# Patient Record
Sex: Male | Born: 1940 | Race: White | Hispanic: No | Marital: Married | Smoking: Former smoker
Health system: Southern US, Community
[De-identification: ages and names within clinical notes are randomized; demographics above are authoritative.]

## PROBLEM LIST (undated history)

## (undated) DIAGNOSIS — Z8669 Personal history of other diseases of the nervous system and sense organs: Secondary | ICD-10-CM

## (undated) DIAGNOSIS — K859 Acute pancreatitis without necrosis or infection, unspecified: Secondary | ICD-10-CM

## (undated) DIAGNOSIS — M1990 Unspecified osteoarthritis, unspecified site: Secondary | ICD-10-CM

## (undated) DIAGNOSIS — S4290XA Fracture of unspecified shoulder girdle, part unspecified, initial encounter for closed fracture: Secondary | ICD-10-CM

## (undated) DIAGNOSIS — I10 Essential (primary) hypertension: Secondary | ICD-10-CM

## (undated) DIAGNOSIS — K219 Gastro-esophageal reflux disease without esophagitis: Secondary | ICD-10-CM

## (undated) DIAGNOSIS — N200 Calculus of kidney: Secondary | ICD-10-CM

## (undated) DIAGNOSIS — I2510 Atherosclerotic heart disease of native coronary artery without angina pectoris: Secondary | ICD-10-CM

## (undated) DIAGNOSIS — I350 Nonrheumatic aortic (valve) stenosis: Secondary | ICD-10-CM

## (undated) DIAGNOSIS — Z87442 Personal history of urinary calculi: Secondary | ICD-10-CM

## (undated) DIAGNOSIS — E785 Hyperlipidemia, unspecified: Secondary | ICD-10-CM

## (undated) DIAGNOSIS — C801 Malignant (primary) neoplasm, unspecified: Secondary | ICD-10-CM

## (undated) DIAGNOSIS — N400 Enlarged prostate without lower urinary tract symptoms: Secondary | ICD-10-CM

## (undated) HISTORY — DX: Calculus of kidney: N20.0

## (undated) HISTORY — DX: Benign prostatic hyperplasia without lower urinary tract symptoms: N40.0

## (undated) HISTORY — PX: KNEE SURGERY: SHX244

## (undated) HISTORY — DX: Atherosclerotic heart disease of native coronary artery without angina pectoris: I25.10

## (undated) HISTORY — PX: EYE SURGERY: SHX253

## (undated) HISTORY — DX: Nonrheumatic aortic (valve) stenosis: I35.0

## (undated) HISTORY — DX: Hyperlipidemia, unspecified: E78.5

## (undated) HISTORY — PX: HAND SURGERY: SHX662

## (undated) HISTORY — DX: Malignant (primary) neoplasm, unspecified: C80.1

## (undated) HISTORY — DX: Personal history of other diseases of the nervous system and sense organs: Z86.69

## (undated) HISTORY — DX: Essential (primary) hypertension: I10

## (undated) HISTORY — DX: Gastro-esophageal reflux disease without esophagitis: K21.9

---

## 1983-08-07 HISTORY — PX: HIP SURGERY: SHX245

## 1999-04-10 ENCOUNTER — Inpatient Hospital Stay (HOSPITAL_COMMUNITY): Admission: EM | Admit: 1999-04-10 | Discharge: 1999-04-14 | Payer: Self-pay | Admitting: Emergency Medicine

## 1999-04-10 ENCOUNTER — Encounter: Payer: Self-pay | Admitting: Emergency Medicine

## 2000-12-14 ENCOUNTER — Encounter: Payer: Self-pay | Admitting: Emergency Medicine

## 2000-12-14 ENCOUNTER — Emergency Department (HOSPITAL_COMMUNITY): Admission: EM | Admit: 2000-12-14 | Discharge: 2000-12-14 | Payer: Self-pay | Admitting: Emergency Medicine

## 2003-10-17 ENCOUNTER — Emergency Department (HOSPITAL_COMMUNITY): Admission: EM | Admit: 2003-10-17 | Discharge: 2003-10-18 | Payer: Self-pay | Admitting: Emergency Medicine

## 2004-06-12 ENCOUNTER — Ambulatory Visit: Payer: Self-pay | Admitting: Endocrinology

## 2005-04-17 ENCOUNTER — Ambulatory Visit: Payer: Self-pay | Admitting: Endocrinology

## 2005-05-10 ENCOUNTER — Ambulatory Visit: Payer: Self-pay | Admitting: Endocrinology

## 2005-05-17 ENCOUNTER — Ambulatory Visit: Payer: Self-pay | Admitting: Endocrinology

## 2005-11-03 ENCOUNTER — Emergency Department (HOSPITAL_COMMUNITY): Admission: EM | Admit: 2005-11-03 | Discharge: 2005-11-04 | Payer: Self-pay | Admitting: Emergency Medicine

## 2005-11-06 ENCOUNTER — Ambulatory Visit: Payer: Self-pay | Admitting: Endocrinology

## 2005-11-26 ENCOUNTER — Emergency Department (HOSPITAL_COMMUNITY): Admission: EM | Admit: 2005-11-26 | Discharge: 2005-11-26 | Payer: Self-pay | Admitting: Emergency Medicine

## 2006-10-10 ENCOUNTER — Ambulatory Visit: Payer: Self-pay | Admitting: Internal Medicine

## 2006-10-10 DIAGNOSIS — K219 Gastro-esophageal reflux disease without esophagitis: Secondary | ICD-10-CM | POA: Insufficient documentation

## 2006-10-10 DIAGNOSIS — R011 Cardiac murmur, unspecified: Secondary | ICD-10-CM | POA: Insufficient documentation

## 2006-10-10 DIAGNOSIS — E785 Hyperlipidemia, unspecified: Secondary | ICD-10-CM | POA: Insufficient documentation

## 2006-10-10 DIAGNOSIS — R079 Chest pain, unspecified: Secondary | ICD-10-CM | POA: Insufficient documentation

## 2006-10-10 DIAGNOSIS — I10 Essential (primary) hypertension: Secondary | ICD-10-CM | POA: Insufficient documentation

## 2006-10-10 DIAGNOSIS — E119 Type 2 diabetes mellitus without complications: Secondary | ICD-10-CM | POA: Insufficient documentation

## 2006-10-14 ENCOUNTER — Encounter (INDEPENDENT_AMBULATORY_CARE_PROVIDER_SITE_OTHER): Payer: Self-pay | Admitting: Internal Medicine

## 2006-10-18 ENCOUNTER — Ambulatory Visit: Payer: Self-pay | Admitting: Internal Medicine

## 2006-10-22 ENCOUNTER — Telehealth (INDEPENDENT_AMBULATORY_CARE_PROVIDER_SITE_OTHER): Payer: Self-pay | Admitting: Internal Medicine

## 2006-10-23 ENCOUNTER — Encounter (INDEPENDENT_AMBULATORY_CARE_PROVIDER_SITE_OTHER): Payer: Self-pay | Admitting: Internal Medicine

## 2006-10-24 ENCOUNTER — Ambulatory Visit (HOSPITAL_COMMUNITY): Admission: RE | Admit: 2006-10-24 | Discharge: 2006-10-24 | Payer: Self-pay | Admitting: Internal Medicine

## 2006-10-24 ENCOUNTER — Ambulatory Visit: Payer: Self-pay | Admitting: Internal Medicine

## 2006-11-05 HISTORY — PX: CARDIAC CATHETERIZATION: SHX172

## 2006-11-08 ENCOUNTER — Ambulatory Visit: Payer: Self-pay | Admitting: Internal Medicine

## 2006-11-11 ENCOUNTER — Ambulatory Visit: Payer: Self-pay | Admitting: Internal Medicine

## 2006-11-11 ENCOUNTER — Ambulatory Visit (HOSPITAL_COMMUNITY): Admission: RE | Admit: 2006-11-11 | Discharge: 2006-11-11 | Payer: Self-pay | Admitting: Internal Medicine

## 2006-11-14 ENCOUNTER — Inpatient Hospital Stay (HOSPITAL_BASED_OUTPATIENT_CLINIC_OR_DEPARTMENT_OTHER): Admission: RE | Admit: 2006-11-14 | Discharge: 2006-11-14 | Payer: Self-pay | Admitting: Cardiovascular Disease

## 2006-11-14 ENCOUNTER — Ambulatory Visit: Payer: Self-pay | Admitting: Cardiovascular Disease

## 2006-11-29 ENCOUNTER — Ambulatory Visit: Payer: Self-pay | Admitting: Internal Medicine

## 2006-11-29 ENCOUNTER — Encounter (INDEPENDENT_AMBULATORY_CARE_PROVIDER_SITE_OTHER): Payer: Self-pay | Admitting: Internal Medicine

## 2007-01-10 ENCOUNTER — Ambulatory Visit: Payer: Self-pay | Admitting: Internal Medicine

## 2007-01-10 DIAGNOSIS — R0609 Other forms of dyspnea: Secondary | ICD-10-CM

## 2007-01-10 DIAGNOSIS — R0989 Other specified symptoms and signs involving the circulatory and respiratory systems: Secondary | ICD-10-CM | POA: Insufficient documentation

## 2007-02-11 ENCOUNTER — Encounter (INDEPENDENT_AMBULATORY_CARE_PROVIDER_SITE_OTHER): Payer: Self-pay | Admitting: *Deleted

## 2007-02-11 LAB — CONVERTED CEMR LAB
Alkaline Phosphatase: 62 units/L
HDL: 35 mg/dL
LDL Cholesterol: 98 mg/dL
Total CHOL/HDL Ratio: 5
Triglycerides: 213 mg/dL
VLDL: 43 mg/dL

## 2007-02-13 ENCOUNTER — Ambulatory Visit: Payer: Self-pay | Admitting: Internal Medicine

## 2007-02-13 DIAGNOSIS — R252 Cramp and spasm: Secondary | ICD-10-CM | POA: Insufficient documentation

## 2007-02-14 ENCOUNTER — Encounter (INDEPENDENT_AMBULATORY_CARE_PROVIDER_SITE_OTHER): Payer: Self-pay | Admitting: Internal Medicine

## 2007-02-14 ENCOUNTER — Telehealth (INDEPENDENT_AMBULATORY_CARE_PROVIDER_SITE_OTHER): Payer: Self-pay | Admitting: *Deleted

## 2007-02-18 ENCOUNTER — Encounter (INDEPENDENT_AMBULATORY_CARE_PROVIDER_SITE_OTHER): Payer: Self-pay | Admitting: *Deleted

## 2007-02-19 ENCOUNTER — Encounter (INDEPENDENT_AMBULATORY_CARE_PROVIDER_SITE_OTHER): Payer: Self-pay | Admitting: Internal Medicine

## 2007-03-13 ENCOUNTER — Ambulatory Visit: Payer: Self-pay | Admitting: Internal Medicine

## 2007-03-27 ENCOUNTER — Encounter (INDEPENDENT_AMBULATORY_CARE_PROVIDER_SITE_OTHER): Payer: Self-pay | Admitting: Internal Medicine

## 2007-03-27 ENCOUNTER — Telehealth (INDEPENDENT_AMBULATORY_CARE_PROVIDER_SITE_OTHER): Payer: Self-pay | Admitting: Internal Medicine

## 2007-04-10 ENCOUNTER — Ambulatory Visit: Payer: Self-pay | Admitting: Internal Medicine

## 2007-05-02 ENCOUNTER — Encounter (INDEPENDENT_AMBULATORY_CARE_PROVIDER_SITE_OTHER): Payer: Self-pay | Admitting: Internal Medicine

## 2007-06-10 ENCOUNTER — Ambulatory Visit: Payer: Self-pay | Admitting: Internal Medicine

## 2007-06-10 ENCOUNTER — Telehealth (INDEPENDENT_AMBULATORY_CARE_PROVIDER_SITE_OTHER): Payer: Self-pay | Admitting: *Deleted

## 2007-06-11 ENCOUNTER — Encounter (INDEPENDENT_AMBULATORY_CARE_PROVIDER_SITE_OTHER): Payer: Self-pay | Admitting: Internal Medicine

## 2007-06-13 ENCOUNTER — Telehealth (INDEPENDENT_AMBULATORY_CARE_PROVIDER_SITE_OTHER): Payer: Self-pay | Admitting: *Deleted

## 2007-06-13 LAB — CONVERTED CEMR LAB
Albumin: 4.3 g/dL (ref 3.5–5.2)
BUN: 16 mg/dL (ref 6–23)
CO2: 24 meq/L (ref 19–32)
Chloride: 98 meq/L (ref 96–112)
Cholesterol: 159 mg/dL (ref 0–200)
Creatinine, Ser: 0.99 mg/dL (ref 0.40–1.50)
Eosinophils Relative: 10 % — ABNORMAL HIGH (ref 0–5)
HDL: 35 mg/dL — ABNORMAL LOW (ref >39)
Hemoglobin: 15.8 g/dL (ref 13.0–17.0)
MCHC: 34.1 g/dL (ref 30.0–36.0)
Monocytes Relative: 11 % (ref 3–11)
Neutro Abs: 2.6 10*3/uL (ref 1.7–7.7)
Neutrophils Relative %: 47 % (ref 43–77)
Potassium: 4 meq/L (ref 3.5–5.3)
RDW: 14.2 % — ABNORMAL HIGH (ref 11.5–14.0)
Sodium: 136 meq/L (ref 135–145)
Triglycerides: 173 mg/dL — ABNORMAL HIGH (ref <150)
WBC: 5.6 10*3/uL (ref 4.0–10.5)

## 2007-07-09 ENCOUNTER — Encounter (INDEPENDENT_AMBULATORY_CARE_PROVIDER_SITE_OTHER): Payer: Self-pay | Admitting: Internal Medicine

## 2007-07-25 ENCOUNTER — Ambulatory Visit: Payer: Self-pay | Admitting: Internal Medicine

## 2007-07-25 DIAGNOSIS — R51 Headache: Secondary | ICD-10-CM | POA: Insufficient documentation

## 2007-07-25 DIAGNOSIS — R519 Headache, unspecified: Secondary | ICD-10-CM | POA: Insufficient documentation

## 2007-07-25 LAB — CONVERTED CEMR LAB
Blood Glucose, Fingerstick: 133
Hgb A1c MFr Bld: 7.1 %
Sed Rate: 4 mm/hr (ref 0–16)

## 2007-10-17 ENCOUNTER — Encounter (INDEPENDENT_AMBULATORY_CARE_PROVIDER_SITE_OTHER): Payer: Self-pay | Admitting: Family Medicine

## 2007-10-17 ENCOUNTER — Telehealth (INDEPENDENT_AMBULATORY_CARE_PROVIDER_SITE_OTHER): Payer: Self-pay | Admitting: *Deleted

## 2007-10-17 ENCOUNTER — Ambulatory Visit: Payer: Self-pay | Admitting: Internal Medicine

## 2007-10-17 DIAGNOSIS — K5900 Constipation, unspecified: Secondary | ICD-10-CM | POA: Insufficient documentation

## 2007-10-23 ENCOUNTER — Telehealth (INDEPENDENT_AMBULATORY_CARE_PROVIDER_SITE_OTHER): Payer: Self-pay | Admitting: *Deleted

## 2007-10-23 ENCOUNTER — Encounter (INDEPENDENT_AMBULATORY_CARE_PROVIDER_SITE_OTHER): Payer: Self-pay | Admitting: Internal Medicine

## 2007-11-13 ENCOUNTER — Encounter (INDEPENDENT_AMBULATORY_CARE_PROVIDER_SITE_OTHER): Payer: Self-pay | Admitting: Internal Medicine

## 2007-12-18 ENCOUNTER — Encounter (INDEPENDENT_AMBULATORY_CARE_PROVIDER_SITE_OTHER): Payer: Self-pay | Admitting: Internal Medicine

## 2008-01-09 ENCOUNTER — Ambulatory Visit: Payer: Self-pay | Admitting: Internal Medicine

## 2008-01-09 DIAGNOSIS — J309 Allergic rhinitis, unspecified: Secondary | ICD-10-CM | POA: Insufficient documentation

## 2008-01-12 ENCOUNTER — Telehealth (INDEPENDENT_AMBULATORY_CARE_PROVIDER_SITE_OTHER): Payer: Self-pay | Admitting: *Deleted

## 2008-01-12 LAB — CONVERTED CEMR LAB
ALT: 29 units/L (ref 0–53)
Alkaline Phosphatase: 58 units/L (ref 39–117)
BUN: 17 mg/dL (ref 6–23)
CO2: 22 meq/L (ref 19–32)
Calcium: 9.4 mg/dL (ref 8.4–10.5)
Cholesterol: 149 mg/dL (ref 0–200)
Glucose, Bld: 100 mg/dL — ABNORMAL HIGH (ref 70–99)
HDL: 32 mg/dL — ABNORMAL LOW (ref >39)
Potassium: 3.9 meq/L (ref 3.5–5.3)
Sodium: 141 meq/L (ref 135–145)
Total CHOL/HDL Ratio: 4.7
Triglycerides: 262 mg/dL — ABNORMAL HIGH (ref <150)

## 2008-01-14 ENCOUNTER — Encounter (INDEPENDENT_AMBULATORY_CARE_PROVIDER_SITE_OTHER): Payer: Self-pay | Admitting: Internal Medicine

## 2008-02-25 ENCOUNTER — Encounter (INDEPENDENT_AMBULATORY_CARE_PROVIDER_SITE_OTHER): Payer: Self-pay | Admitting: Internal Medicine

## 2008-03-03 ENCOUNTER — Encounter (INDEPENDENT_AMBULATORY_CARE_PROVIDER_SITE_OTHER): Payer: Self-pay | Admitting: Internal Medicine

## 2008-04-06 ENCOUNTER — Ambulatory Visit: Payer: Self-pay | Admitting: Internal Medicine

## 2008-04-06 DIAGNOSIS — R143 Flatulence: Secondary | ICD-10-CM

## 2008-04-06 DIAGNOSIS — R141 Gas pain: Secondary | ICD-10-CM | POA: Insufficient documentation

## 2008-04-06 DIAGNOSIS — R142 Eructation: Secondary | ICD-10-CM

## 2008-04-06 LAB — CONVERTED CEMR LAB: Hgb A1c MFr Bld: 5.8 %

## 2008-04-20 ENCOUNTER — Encounter (INDEPENDENT_AMBULATORY_CARE_PROVIDER_SITE_OTHER): Payer: Self-pay | Admitting: Internal Medicine

## 2008-04-20 ENCOUNTER — Encounter: Payer: Self-pay | Admitting: Internal Medicine

## 2008-04-20 ENCOUNTER — Ambulatory Visit: Payer: Self-pay | Admitting: Internal Medicine

## 2008-04-23 ENCOUNTER — Encounter (INDEPENDENT_AMBULATORY_CARE_PROVIDER_SITE_OTHER): Payer: Self-pay | Admitting: Internal Medicine

## 2008-04-27 ENCOUNTER — Encounter (INDEPENDENT_AMBULATORY_CARE_PROVIDER_SITE_OTHER): Payer: Self-pay | Admitting: Internal Medicine

## 2008-05-21 ENCOUNTER — Ambulatory Visit: Payer: Self-pay | Admitting: Internal Medicine

## 2008-07-05 ENCOUNTER — Ambulatory Visit: Payer: Self-pay | Admitting: Internal Medicine

## 2008-07-13 ENCOUNTER — Encounter (INDEPENDENT_AMBULATORY_CARE_PROVIDER_SITE_OTHER): Payer: Self-pay | Admitting: Internal Medicine

## 2008-07-16 LAB — CONVERTED CEMR LAB
ALT: 29 units/L (ref 0–53)
AST: 22 units/L (ref 0–37)
Albumin: 4.6 g/dL (ref 3.5–5.2)
Alkaline Phosphatase: 57 units/L (ref 39–117)
BUN: 13 mg/dL (ref 6–23)
CO2: 24 meq/L (ref 19–32)
Chloride: 103 meq/L (ref 96–112)
LDL Cholesterol: 73 mg/dL (ref 0–99)
PSA: 0.37 ng/mL (ref 0.10–4.00)
Total Protein: 6.8 g/dL (ref 6.0–8.3)
VLDL: 41 mg/dL — ABNORMAL HIGH (ref 0–40)

## 2008-09-07 ENCOUNTER — Encounter (INDEPENDENT_AMBULATORY_CARE_PROVIDER_SITE_OTHER): Payer: Self-pay | Admitting: Internal Medicine

## 2008-09-27 ENCOUNTER — Ambulatory Visit: Payer: Self-pay | Admitting: Internal Medicine

## 2008-09-27 DIAGNOSIS — N401 Enlarged prostate with lower urinary tract symptoms: Secondary | ICD-10-CM | POA: Insufficient documentation

## 2008-09-27 DIAGNOSIS — M25569 Pain in unspecified knee: Secondary | ICD-10-CM | POA: Insufficient documentation

## 2008-09-27 DIAGNOSIS — D485 Neoplasm of uncertain behavior of skin: Secondary | ICD-10-CM | POA: Insufficient documentation

## 2008-09-27 LAB — CONVERTED CEMR LAB
Blood Glucose, Fingerstick: 128
Creatinine, Urine: 149.1 mg/dL
Hgb A1c MFr Bld: 6 %
Microalb, Ur: 1.11 mg/dL (ref 0.00–1.89)

## 2008-10-21 ENCOUNTER — Encounter (INDEPENDENT_AMBULATORY_CARE_PROVIDER_SITE_OTHER): Payer: Self-pay | Admitting: Internal Medicine

## 2008-11-03 ENCOUNTER — Encounter (INDEPENDENT_AMBULATORY_CARE_PROVIDER_SITE_OTHER): Payer: Self-pay | Admitting: Internal Medicine

## 2008-11-04 HISTORY — PX: SKIN CANCER EXCISION: SHX779

## 2008-11-05 ENCOUNTER — Encounter (INDEPENDENT_AMBULATORY_CARE_PROVIDER_SITE_OTHER): Payer: Self-pay | Admitting: Internal Medicine

## 2008-11-09 ENCOUNTER — Encounter (INDEPENDENT_AMBULATORY_CARE_PROVIDER_SITE_OTHER): Payer: Self-pay | Admitting: Internal Medicine

## 2008-12-28 ENCOUNTER — Ambulatory Visit: Payer: Self-pay | Admitting: Internal Medicine

## 2008-12-28 LAB — CONVERTED CEMR LAB: Blood Glucose, Fingerstick: 201

## 2008-12-29 ENCOUNTER — Encounter (INDEPENDENT_AMBULATORY_CARE_PROVIDER_SITE_OTHER): Payer: Self-pay | Admitting: Internal Medicine

## 2008-12-29 LAB — CONVERTED CEMR LAB
ALT: 27 units/L (ref 0–53)
AST: 22 units/L (ref 0–37)
Albumin: 4.4 g/dL (ref 3.5–5.2)
Basophils Absolute: 0 10*3/uL (ref 0.0–0.1)
Basophils Relative: 1 % (ref 0–1)
Cholesterol: 154 mg/dL (ref 0–200)
Creatinine, Ser: 1.11 mg/dL (ref 0.40–1.50)
Eosinophils Absolute: 0.2 10*3/uL (ref 0.0–0.7)
HCT: 45.2 % (ref 39.0–52.0)
HDL: 35 mg/dL — ABNORMAL LOW (ref >39)
Hemoglobin: 15.9 g/dL (ref 13.0–17.0)
LDL Cholesterol: 81 mg/dL (ref 0–99)
Lymphocytes Relative: 29 % (ref 12–46)
MCV: 89 fL (ref 78.0–100.0)
Microalb, Ur: 0.64 mg/dL (ref 0.00–1.89)
RDW: 14 % (ref 11.5–15.5)
Sodium: 138 meq/L (ref 135–145)
Total Bilirubin: 0.9 mg/dL (ref 0.3–1.2)
Total Protein: 6.6 g/dL (ref 6.0–8.3)

## 2009-01-31 ENCOUNTER — Encounter (INDEPENDENT_AMBULATORY_CARE_PROVIDER_SITE_OTHER): Payer: Self-pay | Admitting: Internal Medicine

## 2009-04-25 ENCOUNTER — Encounter (INDEPENDENT_AMBULATORY_CARE_PROVIDER_SITE_OTHER): Payer: Self-pay | Admitting: Internal Medicine

## 2009-05-31 ENCOUNTER — Encounter: Payer: Self-pay | Admitting: Gastroenterology

## 2009-06-03 ENCOUNTER — Ambulatory Visit: Payer: Self-pay | Admitting: Gastroenterology

## 2009-06-03 ENCOUNTER — Encounter: Payer: Self-pay | Admitting: Gastroenterology

## 2009-06-03 ENCOUNTER — Ambulatory Visit (HOSPITAL_COMMUNITY): Admission: RE | Admit: 2009-06-03 | Discharge: 2009-06-03 | Payer: Self-pay | Admitting: Gastroenterology

## 2009-06-03 LAB — HM COLONOSCOPY

## 2010-03-30 ENCOUNTER — Ambulatory Visit: Payer: Self-pay | Admitting: Cardiovascular Disease

## 2010-03-30 ENCOUNTER — Encounter (INDEPENDENT_AMBULATORY_CARE_PROVIDER_SITE_OTHER): Payer: Self-pay | Admitting: Family Medicine

## 2010-03-30 ENCOUNTER — Ambulatory Visit (HOSPITAL_COMMUNITY): Admission: RE | Admit: 2010-03-30 | Discharge: 2010-03-30 | Payer: Self-pay | Admitting: Family Medicine

## 2010-04-03 ENCOUNTER — Encounter: Payer: Self-pay | Admitting: Cardiovascular Disease

## 2010-07-11 ENCOUNTER — Ambulatory Visit: Payer: Self-pay | Admitting: Cardiology

## 2010-07-11 DIAGNOSIS — R0989 Other specified symptoms and signs involving the circulatory and respiratory systems: Secondary | ICD-10-CM | POA: Insufficient documentation

## 2010-07-11 DIAGNOSIS — I447 Left bundle-branch block, unspecified: Secondary | ICD-10-CM | POA: Insufficient documentation

## 2010-07-11 DIAGNOSIS — I359 Nonrheumatic aortic valve disorder, unspecified: Secondary | ICD-10-CM | POA: Insufficient documentation

## 2010-07-12 ENCOUNTER — Encounter: Payer: Self-pay | Admitting: Cardiology

## 2010-07-17 ENCOUNTER — Ambulatory Visit (HOSPITAL_COMMUNITY)
Admission: RE | Admit: 2010-07-17 | Discharge: 2010-07-17 | Payer: Self-pay | Source: Home / Self Care | Attending: Cardiology | Admitting: Cardiology

## 2010-09-03 LAB — CONVERTED CEMR LAB
CO2: 24 meq/L (ref 19–32)
Calcium: 9.6 mg/dL (ref 8.4–10.5)
Cholesterol: 208 mg/dL — ABNORMAL HIGH (ref 0–200)
Glucose, Bld: 87 mg/dL (ref 70–99)
HDL: 35 mg/dL — ABNORMAL LOW (ref >39)
LDL Cholesterol: 134 mg/dL — ABNORMAL HIGH (ref 0–99)
Potassium: 4 meq/L (ref 3.5–5.3)
Sodium: 141 meq/L (ref 135–145)
Total Bilirubin: 1 mg/dL (ref 0.3–1.2)
Total Protein: 7.2 g/dL (ref 6.0–8.3)
Triglycerides: 193 mg/dL — ABNORMAL HIGH (ref <150)
VLDL: 39 mg/dL (ref 0–40)

## 2010-09-05 NOTE — Letter (Signed)
Summary: [place] Results Engineer, agricultural at [facility]  [address], [place] [address]   Phone: [phone]  Fax: [phone]      April 03, 2010 MRN: [id number]   [name] [address] [place], [place]  [address]   Dear [name],  Your test ordered by Selena Batten has been reviewed by your physician (or physician assistant) and was found to be normal or stable. Your physician (or physician assistant) felt no changes were needed at this time.  __X__ Echocardiogram  ____ Cardiac Stress Test  ____ Lab Work  ____ Peripheral vascular study of arms, legs or neck  ____ CT scan or X-ray  ____ Lung or Breathing test  ____ Other: Please continue on current medical treatment.  Thank you.   Charlton Haws, MD, F.A.C.C

## 2010-09-05 NOTE — Letter (Signed)
Summary: [name] RECORDS  [facility]   Imported By: Faythe Ghee 07/12/2010 10:52:23  _____________________________________________________________________  External Attachment:    Type:   Image     Comment:   External Document

## 2010-09-05 NOTE — Assessment & Plan Note (Signed)
Summary: **EC6 STENOSIS  Medications Added PRILOSEC 20 MG CPDR (OMEPRAZOLE) take 1 tab daily NIASPAN 500 MG CR-TABS (NIACIN (ANTIHYPERLIPIDEMIC)) take 1 tab daily * VIT D 2000 take 1 tab daily METOPROLOL SUCCINATE 25 MG XR24H-TAB (METOPROLOL SUCCINATE) take 1 tablet by mouth once daily      Allergies Added:   Visit Type:  Follow-up Primary Provider:  [facility]  CC:  no cardiology complaints.  History of Present Illness: [name] comes in today after a three-year absence from our practice.  He is sent over by his primary care for followup of his nonobstructive coronary disease and mild aortic stenosis. He was catheterized in 2008 and had a 20% stenosis in the proximal LAD, 30% and a first obtuse marginal, EF of 50%, an aortic valve gradient of 19 mm of mercury. He was treated medically.  He's been having some dyspnea on exertion but can walk on flat ground without any problems. He occasionally have a little bit of chest tightness without radiation it he takes out the trash. He has had no symptoms at rest. He denies any syncope. He denies orthopnea PND or edema. He's had no palpitations.   An echocardiogram scheduled by his primary care shows moderate aortic stenosiswith the valve area 1.6 cm square, trivial regurgitation, mild left facial enlargement, normal left ventricular function with EF of 50-55%, moderate LVH. His peak gradient across his aortic valve was 29 mm of mercury with a mean gradient of 19 mmHg. Echo report on 828 2011.  He uses smokeless tobacco.  Clinical Reports Reviewed:  Cardiac Cath:  11/14/2006: Cardiac Cath Findings:   ASSESSMENT:  1. Mild aortic stenosis with an aortic valve area of 1.3 square      centimeters.  2. Nonobstructive coronary artery disease.  3. Low normal left ventricular systolic function.  4. Normal right-sided hemodynamics.   PLAN:  [name] does not appear to have a cardiac etiology to his  symptoms.  His hemodynamics are excellent.  He will require serial  follow-up for his aortic valve stenosis but at this point it is in a  mild range.   Veverly Fells. Excell Seltzer, MD  Electronically Signed   MDC/MEDQ  D:  11/14/2006  T:  11/14/2006  Job:  [id number]   cc:   Pricilla Riffle, MD, [facility]  Erle Crocker, M.D.   Current Medications (verified): 1)  Benazepril-Hydrochlorothiazide 10-12.5 Mg Tabs (Benazepril-Hydrochlorothiazide) .Marland Kitchen.. 1 By Mouth Once Daily 2)  Glyburide-Metformin 2.5-500 Mg  Tabs (Glyburide-Metformin) .Marland Kitchen.. 1 By Mouth Two Times A Day 3)  Bayer Childrens Aspirin 81 Mg Chew (Aspirin) .Marland Kitchen.. 1 By Mouth Once Daily 4)  Pravachol 40 Mg Tabs (Pravastatin Sodium) .Marland Kitchen.. 1 By Mouth Once Daily 5)  Combivent 103-18 Mcg/act  Aero (Albuterol-Ipratropium) .... 2 Puffs Q 6 Hours As Needed Shortness of Breath 6)  Test Strips .... Fsbs Two Times A Day 7)  Lancets 8)  Fish Oil Concentrate 1000 Mg Caps (Omega-3 Fatty Acids) .Marland Kitchen.. 1 By Mouth Two Times A Day 9)  Prilosec 20 Mg Cpdr (Omeprazole) .... Take 1 Tab Daily 10)  Niaspan 500 Mg Cr-Tabs (Niacin (Antihyperlipidemic)) .... Take 1 Tab Daily 11)  Vit D 2000 .... Take 1 Tab Daily 12)  Metoprolol Succinate 25 Mg Xr24h-Tab (Metoprolol Succinate) .... Take 1 Tablet By Mouth Once Daily  Allergies (verified): 1)  Morphine 2)  * Statins  Comments:  Nurse/Medical Assistant: patient brought meds see's [facility] [place] [facility]  Review of Systems       negative other than history of present illness  Vital Signs:  Patient profile:   70 year old male Weight:      170 pounds BMI:     26.52 O2 Sat:      96 % on Room air Pulse rate:   85 / minute BP sitting:   115 / 70  (right arm)  Vitals Entered By: Dreama Saa, CNA (July 11, 2010 10:47 AM)  O2 Flow:  Room air  Physical Exam  General:  no acute distress, Head:  normocephalic and atraumatic Eyes:  glasses otherwise  normal Mouth:  dentures upper, no teeth or dentures bottom Abdomen:  soft, good bowel sounds, no midline bruit Msk:  Back normal, normal gait. Muscle strength and tone normal. Pulses:  pulses normal in all 4 extremities Extremities:  No clubbing or cyanosis. Neurologic:  Alert and oriented x 3. Skin:  Intact without lesions or rashes. Psych:  Normal affect.   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 Check carotid Dopplers. Orders: Carotid Duplex (Carotid Duplex)  Problem # 2:  CARDIAC MURMUR (ICD-785.2)  His updated medication list for this problem includes:    Benazepril-hydrochlorothiazide 10-12.5 Mg Tabs (Benazepril-hydrochlorothiazide) .Marland Kitchen... 1 by mouth once daily    Metoprolol Succinate 25 Mg Xr24h-tab (Metoprolol succinate) .Marland Kitchen... Take 1 tablet by mouth once daily  Problem # 3:  CHEST PAIN, EXERTIONAL (ICD-786.50) This is very mild. We'll treat with continued secondary prevention strategies and add low-dose metoprolol 25 mg a day. His updated medication list for this problem includes:    Benazepril-hydrochlorothiazide 10-12.5 Mg Tabs (Benazepril-hydrochlorothiazide) .Marland Kitchen... 1 by mouth once daily    Bayer Childrens Aspirin 81 Mg Chew (Aspirin) .Marland Kitchen... 1 by mouth once daily    Metoprolol Succinate 25 Mg Xr24h-tab (Metoprolol succinate) .Marland Kitchen... Take 1 tablet by mouth once daily  Problem # 4:  AORTIC STENOSIS/ INSUFFICIENCY, NON-RHEUMATIC (ICD-424.1) Assessment: Unchanged I have strongly advised him to see [place] once a year. He will most likely need valve replacement in the future. Symptoms  discussed and reinforced. His updated medication list for this problem includes:    Benazepril-hydrochlorothiazide 10-12.5 Mg Tabs (Benazepril-hydrochlorothiazide) .Marland Kitchen... 1 by mouth once daily    Metoprolol Succinate 25 Mg Xr24h-tab (Metoprolol succinate) .Marland Kitchen... Take 1 tablet by mouth once daily  Problem # 5:  LBBB (ICD-426.3) Assessment: Unchanged  His updated medication list for this problem includes:    Benazepril-hydrochlorothiazide 10-12.5 Mg Tabs (Benazepril-hydrochlorothiazide) .Marland Kitchen... 1 by mouth once daily    Bayer Childrens Aspirin 81 Mg Chew (Aspirin) .Marland Kitchen... 1 by mouth once daily    Metoprolol Succinate 25 Mg Xr24h-tab (Metoprolol succinate) .Marland Kitchen... Take 1 tablet by mouth once daily  Patient Instructions: 1)  Your physician recommends that you schedule a follow-up appointment in: 1 YEAR, please mark calender 2)  Your physician has recommended you make the following change in your medication: Start taking Metoprolol 25mg  by mouth once daily  3)  Your physician has requested that you have a carotid duplex.  This test is an ultrasound of the carotid arteries in your neck. It looks at blood flow through these arteries that supply the brain with blood. Allow one hour for this exam. There are no restrictions or special instructions. Prescriptions: METOPROLOL SUCCINATE 25 MG XR24H-TAB (METOPROLOL SUCCINATE) take 1 tablet by mouth once daily  #30 x 11   Entered by:   Larita Fife Via LPN   Authorized by:   Gaylord Shih, MD, [facility]   Signed by:   Larita Fife Via LPN on 16/05/9603   Method used:   Electronically to        [id number] (retail)       [address]       [place], [place]  [address]       Ph: [phone]       Fax: [phone]   RxID:   [id number]

## 2010-11-07 ENCOUNTER — Encounter: Payer: Self-pay | Admitting: Physician Assistant

## 2010-11-08 ENCOUNTER — Encounter: Payer: Self-pay | Admitting: Family Medicine

## 2010-11-09 LAB — GLUCOSE, CAPILLARY: Glucose-Capillary: 122 mg/dL — ABNORMAL HIGH (ref 70–99)

## 2010-12-22 NOTE — Assessment & Plan Note (Signed)
[facility]ALTHCARE                       [place] CARDIOLOGY OFFICE NOTE   AUDREY, ELLER                       MRN:          [id number]  DATE:11/11/2006                            DOB:          27-Oct-1940    PATIENT IDENTIFICATION:  [name] is a 70 year old gentleman who I saw  for the first time back on March 14. He was referred by Dr. Erle Travis for evaluation of chest pressure and shortness of breath. Note he  had a Myoview back in 2005 that was normal. The patient said he had been  noting more shortness of breath with exertion when he went up a hill  with some chest tightness, nothing on flat ground, but it progressed  over the past year. He had been slowing his activity down some.   When I saw him, I was impressed that he had a valve murmur consistent  with aortic stenosis. I went ahead and scheduled an echocardiogram. This  was done on March 20. The ventricle was mildly thickened, the aortic  valve was thick and calcified with mild to moderately restricted motion  with a peak and mean gradient through the valve of 40 and 25 mmHg  respectively. The echo also showed an LVF that was mildly depressed at  45% with inferior and lateral hypokinesis and mild diastolic  dysfunction.   Based on this, I recommended to see the patient back and discussed  cardiac catheterization.   Since seen, he has been taking activities as tolerated, denies chest  pain.   ALLERGIES:  MORPHINE, ALTACE leading to cough, LIPITOR to leg cramps,  CRESTOR leg cramps, ALLEGRA abdominal cramps, VYTORIN stomach ache.   MEDICATIONS:  1. Aspirin 81 mg daily.  2. Actos 45 mg daily.  3. Multivitamin.  4. Welchol 625 t.i.d.  5. Benazepril/hydrochlorothiazide 10/12.5 daily.   PAST MEDICAL HISTORY:  1. Type 2 diabetes diagnosed 15 years ago.  2. Dyslipidemia diagnosed 15 years ago.  3. Hypertension diagnosed 15 years ago.   FAMILY HISTORY:  Mother had CAD.   SOCIAL HISTORY:  The patient quit tobacco 15 years ago after a 30 pack  year. No alcohol, married.   REVIEW OF SYSTEMS:  All 10 systems reviewed and negative to the above  problem except as noted.   PHYSICAL EXAMINATION:  GENERAL:  The patient is currently in no  distress.  VITAL SIGNS:  Blood pressure is 114/70, pulse is 90 and regular, weight  183, stable from last visit.  HEENT:  Normocephalic, atraumatic. EOMI. PERRL. Throat clear.  NECK:  JVP is normal. No bruits, no thyromegaly.  LUNGS:  Clear to auscultation without wheezes or rales.  CARDIAC:  Distant S1, S2. Grade 2-3/6 systolic murmur heard best at the  base.  ABDOMEN:  Benign, no hepatomegaly.  EXTREMITIES:  Good distal pulses, no edema.   A 12-lead EKG sinus rhythm, left bundle branch block, 89 beats per  minute.   IMPRESSION:  [name] is a gentleman with multiple cardiac risk  factors, increasing shortness of breath, and dyspnea over the past year,  some chest pressure, resolves at rest. He  has slowed his activity down.   Exam suggestive of aortic stenosis and indeed is mild to moderate on  echo. Also with this, he has a mildly depressed left ventricular  systolic function which is different from 2005.   Based on the above, I would recommend cardiac catheterization to  definite the anatomy. The risks and benefits explained. The overall risk  was explained. Overall I think the risk for major complications  outweighed by benefits. The patient understands and agrees to proceed.  Will schedule in the JV lab for later this week. Chest x-ray and labs  today.     Pricilla Riffle, MD, [facility]  Electronically Signed    PVR/MedQ  DD: 11/11/2006  DT: 11/11/2006  Job #: [id number]   cc:   [name], M.D.

## 2010-12-22 NOTE — Cardiovascular Report (Signed)
[name], [name]                ACCOUNT NO.:  [id number]   MEDICAL RECORD NO.:  [id number]          PATIENT TYPE:  OIB   LOCATION:  1966                         FACILITY:  [facility]   PHYSICIAN:  Veverly Fells. Excell Seltzer, MD  DATE OF BIRTH:  [birth year]   DATE OF PROCEDURE:  11/14/2006  DATE OF DISCHARGE:                            CARDIAC CATHETERIZATION   PROCEDURE:  Left heart catheterization, right heart catheterization,  selective coronary angiography, left ventricular angiography.   INDICATIONS:  [name] is a very nice 70 year old gentleman from  [place].  He has had chest pain and shortness of breath with fairly  typical exertional symptoms.  He has been found to have aortic stenosis  by echocardiography and also mild left ventricular dysfunction.  He was  referred for right and left heart catheterization.   Risks and indications of the procedure were explained to the patient.  Informed consent was obtained.  The right groin was prepped, draped and  anesthetized with 1% lidocaine.  Using modified Seldinger technique a 7-  French sheath was placed in the right femoral vein and a 5-French sheath  was placed in the right femoral artery.  The right heart catheterization  was performed first using a Swan-Ganz catheter.  Pressures were recorded  throughout the right heart chambers.  Oxygen saturations were drawn from  the pulmonary artery and the central aorta.  Cardiac output was  calculated via the Fick method.   Following the right heart catheterization, an angled pigtail catheter  was inserted into the left ventricle and pressures were recorded.  A 30  degree right anterior oblique left ventriculogram was performed.  A  pullback across the aortic valve was done.  Selective coronary  angiography was then performed using standard preformed 4-French Judkins  catheters.  Multiple views of the right and left coronary arteries were  taken.  At the conclusion of the procedure the  sheaths were pulled and  manual pressure used for hemostasis.  All catheter exchanges were  performed over a guidewire.  There were no complications.   FINDINGS:  Right atrial pressure:  A-wave 3, V-wave 2, mean of 1.  Right  ventricular pressure is 25/3, pulmonary artery pressure is 19/4 with a  mean of 10.  Pulmonary capillary wedge pressure: A-wave 5, V-wave 3,  mean of 2.  Left ventricular pressure is 121/4.  Aortic pressure is  102/50 with a mean of 71.  The peak aortic valve gradient is 19 mmHg.  The aortic valve area calculates at 1.3 square centimeters.   Oxygen saturation from the pulmonary artery is 71%.  Aortic oxygen  saturation is 92%.  Fick cardiac output is 5.4 liters per minute with a  cardiac index of 2.80 liters per minute per meter squared.   CORONARY ANGIOGRAPHY:  The left mainstem is angiographically normal.  It  bifurcates into the LAD and left circumflex.   The LAD is large-caliber vessel that courses down and then wraps around  the left ventricular apex.  There are minor luminal irregularities in  the proximal LAD.  The LAD gives off a large first diagonal  branch that  bifurcates into twin vessels.  Other than a minor 20% plaque in the  proximal LAD I do not appreciate any angiographic disease throughout the  LAD or first diagonal branch.   The left circumflex is a medium caliber vessel.  There is focal  nonobstructive plaque in the proximal portion of the circumflex and  there is a large first OM branch that has a 30% irregular stenosis.  That vessel supplies a large portion of the posterolateral wall.  The  true AV groove circumflex beyond the marginal branch is very small.   The right coronary artery has minor luminal irregularities throughout.  There is no significant obstructive disease throughout the entire right  coronary artery.  It is a dominant vessel with a large PDA branch.  There are small RV marginal branches present.  There is no posterior AV   segment from the right coronary artery.   Left ventricular function assessed by 30 degree right anterior oblique  left ventriculography demonstrates low normal left ventricular systolic  function with an estimated ejection fraction of 50%.   ASSESSMENT:  1. Mild aortic stenosis with an aortic valve area of 1.3 square      centimeters.  2. Nonobstructive coronary artery disease.  3. Low normal left ventricular systolic function.  4. Normal right-sided hemodynamics.   PLAN:  [name] does not appear to have a cardiac etiology to his  symptoms.  His hemodynamics are excellent.  He will require serial  follow-up for his aortic valve stenosis but at this point it is in a  mild range.      Veverly Fells. Excell Seltzer, MD  Electronically Signed     MDC/MEDQ  D:  11/14/2006  T:  11/14/2006  Job:  [id number]   cc:   Pricilla Riffle, MD, [facility]  Erle Crocker, M.D.

## 2010-12-22 NOTE — Assessment & Plan Note (Signed)
[facility]CARE                       [place] CARDIOLOGY OFFICE NOTE   JAQWON, MANFRED                       MRN:          [id number]  DATE:11/29/2006                            DOB:          Mar 02, 1941    IDENTIFICATION:  Mr. Callaham is a 70 year old gentleman who I saw earlier  this month for evaluation of shortness of breath, chest pressure.  Refer  to this for full details.   The patient was setup for a cardiac catheterization.  He had this done  on April 10.  Right sided pressures were normal.  Aortic valve gradient  was 19 mm of mercury.  Coronary artery showed minimal narrowings with a  20% plaque in the proximal LAD, 30% lesion in a first OM.  LVF was 50%.   The patient otherwise says he is doing okay.  He gives out and has  shortness of breath when he pushes things at times.  Denies chest pain.  No problems with his groin.   CURRENT MEDICATIONS:  1. Aspirin 81 mg daily.  2. Actos 45 daily.  3. Multivitamin daily.  4. Welchol 625 t.i.d.  5. Benazepril/hydrochlorothiazide 10/12.5 daily.   PHYSICAL EXAMINATION:  The patient is in no distress.  At rest blood pressure is 120/70, pulse is 74 and regular.  Weight 181.  NECK:  JVP is normal.  LUNGS:  Mild wheeze with forced expiration, otherwise clear.  No rales.  CARDIAC:  Regular rate and rhythm.  S1, S2.  Grade 2/6 systolic murmur  heard best at the base  ABDOMEN:  Is benign.  EXTREMITIES:  Right groin without hematoma or bruit.  No edema.   IMPRESSION:  1. Shortness of breath.  With a wheeze on exam today, I would schedule      for pulmonary function tests with bronchodilator trial.  Again, his      aortic stenosis is mild.  He has minimal coronary artery disease.  2. Aortic stenosis, followup echo in a year.  3. Mild coronary artery disease.  Optimize medicines.  4. Dyslipidemia.  LDL on last check was 134.  HDL 35.  He has failed      Lipitor, Crestor and Vytorin.  Will give him a  prescription for      Pravachol.  Followup lipids, LFTs in 10 weeks.  I will be in touch      with him regarding the PFT results and his lab results, otherwise      followup in the spring.   ADDENDUM:  Counseled to increase his physical activity.     Pricilla Riffle, MD, [facility]  Electronically Signed    PVR/MedQ  DD: 11/29/2006  DT: 11/29/2006  Job #: [id number]   cc:   Erle Crocker, M.D.

## 2010-12-22 NOTE — Assessment & Plan Note (Signed)
[place] [facility]                       [place] CARDIOLOGY OFFICE NOTE   SHANTA, DORVIL                       MRN:          [id number]  DATE:10/18/2006                            DOB:          [birth year]    IDENTIFICATION:  Mr. Schnick is a 70 year old gentleman who is referred  for chest pain, shortness of breath, note he has never been seen in  cardiology before. He has no known history of coronary artery disease,  negative Myoview back in 2005.   The patient says today that he has been noticing more shortness of  breath with exertion, when he goes up a hill, he will have some chest  tightness, nothing on the flat ground. It is more progressive over the  past year. He may be slowing his activity down some. The episodes last  about 5 to 10 minutes and resolve.   He notes no episodes of dizziness, no presyncope, no PND.   Again the patient had a Myoview scan in 2005 for a left bundle branch  block on his EKG.   CURRENT MEDICATIONS:  Include;  1. Aspirin 81 mg q.d.  2. Actos 45 mg q.d.  3. Multivitamin q.d.  4. WelChol 625 t.i.d.  5. Benazepril/hydrochlorothiazide 10/12.5 q.d.   ALLERGIES:  INCLUDE MORPHINE, ALTACE LEADING TO COUGH, LIPITOR LEADING  TO LEG CRAMPS AS DID CRESTOR, ALLEGRA LEADING TO ABDOMINAL CRAMPS.  VYTORIN MADE HIM SICK TO STOMACH, STOPPED AFTER 2 TO 3 MONTHS.   PAST MEDICAL HISTORY:  1. Diabetes type 2, diagnosed 15 years ago.  2. Dyslipidemia, times 15 years.  3. Hypertension, diagnosed 15 years ago.   FAMILY HISTORY:  Mother had coronary artery disease, sister with heart  trouble.   SOCIAL HISTORY:  He is married, quit tobacco 15 years ago after a 30-  pack-year, does not drink.   REVIEW OF SYSTEMS:  All 10 systems reviewed, negative to the above  problem except as noted.   PHYSICAL EXAMINATION:  On exam the patient is in no distress. Blood  pressure is 118/72, pulse is 89 and regular, weight 183.  HEENT:  Normocephalic, atraumatic. EOMI: PERRLA. Throat clear.  NECK: JVP is normal, no bruits. No thyromegaly.  LUNGS: Clear to auscultation.  CARDIAC EXAM: Slightly distant heart sounds. Regular rate and rhythm,  S1, S2 slightly decreased grade 2/6 mid peaking Sylotic murmur heard  best at the base.  ABDOMINAL EXAM: No hepatomegaly. Supple, nontender, no masses.  EXTREMITIES: Good distal pulses throughout. No lower extremity edema.   A 12-lead EKG shows normal sinus rhythm 89 beats per minute. Left bundle  branch block.   IMPRESSION:  1. Mr. Mccarter is a 70 year old gentleman with multiple cardiac risk      factors, he comes in with shortness of breath, chest pressure.  On      examination today he has signs for aortic stenosis, I do not see      that an old echo was done and I will go ahead and set him up with      this to see how severe it is. Based on this we will  discuss further      for cardiac catheterization but overall I think with his      progressive symptoms and his risk factors, I think cardiac      catheterization is indicated but I again would like to have the      echo results first.  In the interval I told him to take activities      as tolerated.  2. Hypertension, adequate control.  3. Dyslipidemia, I do not have the fasting lipid panel for diabetes on      diet control, patient says his hemoglobin A1C is good.   ADDENDUM:  Note lipids from October 10, 2006 total cholesterol 208,  triglycerides are 193, LDL is 134, HDL is 35, again will need to review  with patient.     Pricilla Riffle, MD, [facility]  Electronically Signed    PVR/MedQ  DD: 10/18/2006  DT: 10/20/2006  Job #: [id number]   cc:   Erle Crocker, M.D.

## 2010-12-22 NOTE — Procedures (Signed)
NAME:  [name], [name] NO.:  [id number]   MEDICAL RECORD NO.:  [id number]          PATIENT TYPE:  OUT   LOCATION:  RAD                           FACILITY:  [facility]   PHYSICIAN:  Pricilla Riffle, MD, FACCDATE OF BIRTH:  Nov 25, 1940   DATE OF PROCEDURE:  10/24/2006  DATE OF DISCHARGE:                                ECHOCARDIOGRAM   TEST INDICATION:  The patient is a 70 year old with history of a murmur.   TWO-DIMENSIONAL ECHO WITH ECHO DOPPLER:  Left ventricle is normal in  size with an end-diastolic dimension of 41 mm, the interventricular  septum is mildly thickened at 14 mm, posterior wall 13 mm, left atrium  is normal at 40 mm, right atrium and right ventricle are normal.   The aortic valve is thick and calcified with what appears to be mildly  restricted motion, peak and mean gradient through the valve are 40 and  25 mmHg.  Mitral valve is mildly thickened with mild to moderate annular  calcification.  There is no insufficiency.  Pulmonic valve is not well  seen.  There is no insufficiency.  Tricuspid valve is normal with no  insufficiency.   Acoustic windows are difficult and endocardium is not well seen.  The LV  systolic function appears to be mildly depressed though with hypokinesis  of the lateral inferior walls.  Overall LVEF estimated at about 45%.  There is mild diastolic dysfunction.   RVEF is normal.  No pericardial effusion is seen.      Pricilla Riffle, MD, [facility]  Electronically Signed     PVR/MEDQ  D:  10/24/2006  T:  10/24/2006  Job:  [id number]

## 2011-01-08 ENCOUNTER — Emergency Department (HOSPITAL_COMMUNITY)
Admission: EM | Admit: 2011-01-08 | Discharge: 2011-01-08 | Disposition: A | Discharge status: Home / Self Care | Payer: Medicare Other | Attending: Emergency Medicine | Admitting: Emergency Medicine

## 2011-01-08 DIAGNOSIS — R109 Unspecified abdominal pain: Secondary | ICD-10-CM | POA: Insufficient documentation

## 2011-01-08 DIAGNOSIS — R319 Hematuria, unspecified: Secondary | ICD-10-CM | POA: Insufficient documentation

## 2011-01-08 DIAGNOSIS — E119 Type 2 diabetes mellitus without complications: Secondary | ICD-10-CM | POA: Insufficient documentation

## 2011-01-08 DIAGNOSIS — E876 Hypokalemia: Secondary | ICD-10-CM | POA: Insufficient documentation

## 2011-01-08 LAB — URINALYSIS, ROUTINE W REFLEX MICROSCOPIC
Glucose, UA: NEGATIVE mg/dL
Ketones, ur: NEGATIVE mg/dL
Protein, ur: NEGATIVE mg/dL
Specific Gravity, Urine: 1.015 (ref 1.005–1.030)
pH: 5.5 (ref 5.0–8.0)

## 2011-01-08 LAB — CBC
Hemoglobin: 14.7 g/dL (ref 13.0–17.0)
MCH: 30.9 pg (ref 26.0–34.0)
MCHC: 33.6 g/dL (ref 30.0–36.0)
MCV: 92 fL (ref 78.0–100.0)

## 2011-01-08 LAB — COMPREHENSIVE METABOLIC PANEL
Alkaline Phosphatase: 62 U/L (ref 39–117)
BUN: 13 mg/dL (ref 6–23)
CO2: 31 mEq/L (ref 19–32)
Chloride: 96 mEq/L (ref 96–112)
GFR calc non Af Amer: >60 mL/min (ref >60)
Glucose, Bld: 85 mg/dL (ref 70–99)
Potassium: 2.9 mEq/L — ABNORMAL LOW (ref 3.5–5.1)
Sodium: 137 mEq/L (ref 135–145)
Total Protein: 7.1 g/dL (ref 6.0–8.3)

## 2011-01-08 LAB — DIFFERENTIAL
Basophils Relative: 1 % (ref 0–1)
Eosinophils Absolute: 0.2 10*3/uL (ref 0.0–0.7)
Lymphs Abs: 2.4 10*3/uL (ref 0.7–4.0)
Monocytes Absolute: 0.6 10*3/uL (ref 0.1–1.0)
Monocytes Relative: 8 % (ref 3–12)
Neutro Abs: 4.3 10*3/uL (ref 1.7–7.7)

## 2011-01-17 ENCOUNTER — Other Ambulatory Visit (HOSPITAL_COMMUNITY): Payer: Self-pay | Admitting: Urology

## 2011-01-17 DIAGNOSIS — R3129 Other microscopic hematuria: Secondary | ICD-10-CM

## 2011-01-23 ENCOUNTER — Ambulatory Visit (HOSPITAL_COMMUNITY)
Admission: RE | Admit: 2011-01-23 | Discharge: 2011-01-23 | Disposition: A | Discharge status: Home / Self Care | Payer: Medicare Other | Source: Ambulatory Visit | Attending: Urology | Admitting: Urology

## 2011-01-23 DIAGNOSIS — E119 Type 2 diabetes mellitus without complications: Secondary | ICD-10-CM | POA: Insufficient documentation

## 2011-01-23 DIAGNOSIS — N201 Calculus of ureter: Secondary | ICD-10-CM | POA: Insufficient documentation

## 2011-01-23 DIAGNOSIS — R109 Unspecified abdominal pain: Secondary | ICD-10-CM | POA: Insufficient documentation

## 2011-01-23 DIAGNOSIS — K8020 Calculus of gallbladder without cholecystitis without obstruction: Secondary | ICD-10-CM | POA: Insufficient documentation

## 2011-01-23 DIAGNOSIS — I10 Essential (primary) hypertension: Secondary | ICD-10-CM | POA: Insufficient documentation

## 2011-01-23 DIAGNOSIS — K7689 Other specified diseases of liver: Secondary | ICD-10-CM | POA: Insufficient documentation

## 2011-01-23 DIAGNOSIS — R3129 Other microscopic hematuria: Secondary | ICD-10-CM | POA: Insufficient documentation

## 2011-01-23 MED ORDER — IOHEXOL 300 MG/ML  SOLN
125.0000 mL | Freq: Once | INTRAMUSCULAR | Status: AC | PRN
  Administered 2011-01-23: 125 mL via INTRAVENOUS

## 2011-01-25 ENCOUNTER — Ambulatory Visit (HOSPITAL_COMMUNITY)
Admission: RE | Admit: 2011-01-25 | Discharge: 2011-01-25 | Disposition: A | Discharge status: Home / Self Care | Payer: PRIVATE HEALTH INSURANCE | Source: Ambulatory Visit | Attending: Urology | Admitting: Urology

## 2011-01-25 ENCOUNTER — Other Ambulatory Visit (HOSPITAL_COMMUNITY): Payer: Self-pay | Admitting: Urology

## 2011-01-25 DIAGNOSIS — R109 Unspecified abdominal pain: Secondary | ICD-10-CM | POA: Insufficient documentation

## 2011-01-25 DIAGNOSIS — N201 Calculus of ureter: Secondary | ICD-10-CM

## 2011-02-01 ENCOUNTER — Other Ambulatory Visit (HOSPITAL_COMMUNITY): Payer: Self-pay | Admitting: Urology

## 2011-02-01 ENCOUNTER — Ambulatory Visit (HOSPITAL_COMMUNITY)
Admission: RE | Admit: 2011-02-01 | Discharge: 2011-02-01 | Disposition: A | Discharge status: Home / Self Care | Payer: PRIVATE HEALTH INSURANCE | Source: Ambulatory Visit | Attending: Urology | Admitting: Urology

## 2011-02-01 DIAGNOSIS — N201 Calculus of ureter: Secondary | ICD-10-CM

## 2011-02-01 DIAGNOSIS — R109 Unspecified abdominal pain: Secondary | ICD-10-CM | POA: Insufficient documentation

## 2011-06-18 ENCOUNTER — Other Ambulatory Visit: Payer: Self-pay | Admitting: *Deleted

## 2011-06-18 MED ORDER — METOPROLOL SUCCINATE ER 25 MG PO TB24: 25.0000 mg | ORAL_TABLET | Freq: Every day | ORAL | Status: DC

## 2011-07-12 ENCOUNTER — Encounter: Payer: Self-pay | Admitting: Adult Health

## 2011-07-24 ENCOUNTER — Ambulatory Visit: Payer: PRIVATE HEALTH INSURANCE | Admitting: Adult Health

## 2011-07-24 ENCOUNTER — Encounter: Payer: Self-pay | Admitting: Adult Health

## 2011-07-24 ENCOUNTER — Ambulatory Visit (INDEPENDENT_AMBULATORY_CARE_PROVIDER_SITE_OTHER): Payer: PRIVATE HEALTH INSURANCE | Admitting: Adult Health

## 2011-07-24 DIAGNOSIS — I359 Nonrheumatic aortic valve disorder, unspecified: Secondary | ICD-10-CM

## 2011-07-24 DIAGNOSIS — I2510 Atherosclerotic heart disease of native coronary artery without angina pectoris: Secondary | ICD-10-CM

## 2011-07-24 DIAGNOSIS — I060 Rheumatic aortic stenosis: Secondary | ICD-10-CM

## 2011-07-24 DIAGNOSIS — I10 Essential (primary) hypertension: Secondary | ICD-10-CM
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
## 2011-07-24 NOTE — Assessment & Plan Note (Signed)
Will planned echocardiogram for continued evaluation of stenosis.  He is having what he describes as "'gas pain" which is relieved with TUMs.  Uncertain if this is symptomatic in  stenosis. Doubt this.

## 2011-07-24 NOTE — Assessment & Plan Note (Signed)
Currently well controlled. No changes on medications.  He is given refills on all of his cardiac medications.

## 2011-07-24 NOTE — Patient Instructions (Signed)
**Note De-Identified  Obfuscation** Your physician recommends that you continue on your current medications as directed. Please refer to the Current Medication list given to you today.  Your physician has requested that you have an echocardiogram. Echocardiography is a painless test that uses sound waves to create images of your heart. It provides your doctor with information about the size and shape of your heart and how well your heart's chambers and valves are working. This procedure takes approximately one hour. There are no restrictions for this procedure.  Your physician recommends that you schedule a follow-up appointment in: 1 year

## 2011-08-22 ENCOUNTER — Other Ambulatory Visit: Payer: Self-pay

## 2011-08-22 DIAGNOSIS — I062 Rheumatic aortic stenosis with insufficiency: Secondary | ICD-10-CM

## 2011-08-23 ENCOUNTER — Other Ambulatory Visit: Payer: Self-pay

## 2011-08-28 ENCOUNTER — Ambulatory Visit (HOSPITAL_COMMUNITY)
Admission: RE | Admit: 2011-08-28 | Discharge: 2011-08-28 | Disposition: A | Discharge status: Home / Self Care | Payer: Medicare Other | Source: Ambulatory Visit | Attending: Cardiology | Admitting: Cardiology

## 2011-08-28 DIAGNOSIS — I359 Nonrheumatic aortic valve disorder, unspecified: Secondary | ICD-10-CM | POA: Insufficient documentation

## 2011-08-28 DIAGNOSIS — E119 Type 2 diabetes mellitus without complications: Secondary | ICD-10-CM | POA: Insufficient documentation

## 2011-08-28 DIAGNOSIS — R079 Chest pain, unspecified: Secondary | ICD-10-CM | POA: Insufficient documentation

## 2011-08-28 DIAGNOSIS — I10 Essential (primary) hypertension: Secondary | ICD-10-CM | POA: Insufficient documentation

## 2011-08-28 NOTE — Progress Notes (Signed)
*  PRELIMINARY RESULTS* Echocardiogram 2D Echocardiogram has been performed.  [name] 08/28/2011, 9:03 AM

## 2011-10-11 ENCOUNTER — Other Ambulatory Visit: Payer: Self-pay | Admitting: *Deleted

## 2011-10-11 DIAGNOSIS — R0989 Other specified symptoms and signs involving the circulatory and respiratory systems: Secondary | ICD-10-CM

## 2011-10-12 ENCOUNTER — Encounter (INDEPENDENT_AMBULATORY_CARE_PROVIDER_SITE_OTHER): Payer: Medicare Other

## 2011-10-12 DIAGNOSIS — I6529 Occlusion and stenosis of unspecified carotid artery: Secondary | ICD-10-CM

## 2011-10-12 DIAGNOSIS — R0989 Other specified symptoms and signs involving the circulatory and respiratory systems: Secondary | ICD-10-CM

## 2012-05-21 ENCOUNTER — Other Ambulatory Visit: Payer: Self-pay | Admitting: Cardiology

## 2012-05-21 ENCOUNTER — Encounter (INDEPENDENT_AMBULATORY_CARE_PROVIDER_SITE_OTHER): Payer: Medicare Other

## 2012-05-21 DIAGNOSIS — I6529 Occlusion and stenosis of unspecified carotid artery: Secondary | ICD-10-CM

## 2012-06-12 ENCOUNTER — Other Ambulatory Visit: Payer: Self-pay | Admitting: *Deleted

## 2012-06-12 DIAGNOSIS — I779 Disorder of arteries and arterioles, unspecified: Secondary | ICD-10-CM

## 2012-09-25 ENCOUNTER — Encounter: Payer: Self-pay | Admitting: Adult Health

## 2012-09-25 ENCOUNTER — Ambulatory Visit (INDEPENDENT_AMBULATORY_CARE_PROVIDER_SITE_OTHER): Payer: Medicare Other | Admitting: Adult Health

## 2012-09-25 ENCOUNTER — Encounter: Payer: Self-pay | Admitting: *Deleted

## 2012-09-25 VITALS — BP 104/64 | Ht 68.0 in | Wt 168.0 lb

## 2012-09-25 DIAGNOSIS — E785 Hyperlipidemia, unspecified: Secondary | ICD-10-CM

## 2012-09-25 DIAGNOSIS — I2510 Atherosclerotic heart disease of native coronary artery without angina pectoris: Secondary | ICD-10-CM

## 2012-09-25 DIAGNOSIS — I359 Nonrheumatic aortic valve disorder, unspecified: Secondary | ICD-10-CM

## 2012-09-25 DIAGNOSIS — E7800 Pure hypercholesterolemia, unspecified: Secondary | ICD-10-CM

## 2012-09-25 DIAGNOSIS — I10 Essential (primary) hypertension: Secondary | ICD-10-CM

## 2012-09-25 NOTE — Assessment & Plan Note (Signed)
Very well controlled at present. Continue ACE with HCTZ. Labs are completed by PCP yesterday.
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
## 2012-09-25 NOTE — Progress Notes (Deleted)
Name: [name]    DOB: 1940/11/29  Age: 72 y.o.  MR#: [id number]       PCP:  Frazier Richards, PA-C      Insurance: Payor: [employer] [place] [employer]  Plan: BLUE MEDICARE  Product Type: *No Product type*    CC:    Chief Complaint  Patient presents with  . Follow-up    VS Filed Vitals:   09/25/12 1247  BP: 104/64  Height: 5\' 8"  (1.727 m)  Weight: 168 lb (76.204 kg)    Weights Current Weight  09/25/12 168 lb (76.204 kg)  07/24/11 165 lb (74.844 kg)  07/11/10 170 lb (77.111 kg)    Blood Pressure  BP Readings from Last 3 Encounters:  09/25/12 104/64  07/24/11 128/74  07/11/10 115/70     Admit date:  (Not on file) Last encounter with RMR:  Visit date not found   Allergy Morphine and Statins  Current Outpatient Prescriptions  Medication Sig Dispense Refill  . aspirin 81 MG tablet Take 81 mg by mouth daily.        . benazepril-hydrochlorthiazide (LOTENSIN HCT) 10-12.5 MG per tablet Take 1 tablet by mouth daily.        . Cholecalciferol (VITAMIN D) 2000 UNITS tablet Take 2,000 Units by mouth daily.        . fish oil-omega-3 fatty acids 1000 MG capsule 1 tab po bid        . LANTUS SOLOSTAR 100 UNIT/ML injection 38 Units at bedtime.       . metFORMIN (GLUMETZA) 500 MG (MOD) 24 hr tablet Take 500 mg by mouth daily with breakfast.        . metoprolol succinate (TOPROL-XL) 25 MG 24 hr tablet Take 1 tablet (25 mg total) by mouth daily.  30 tablet  12  . NOVOFINE 30G X 8 MM MISC       . omeprazole (PRILOSEC) 20 MG capsule Take 20 mg by mouth daily.        . pravastatin (PRAVACHOL) 40 MG tablet Take 40 mg by mouth daily.        . meloxicam (MOBIC) 7.5 MG tablet Take 7.5 mg by mouth as needed.         No current facility-administered medications for this visit.    Discontinued Meds:    Medications Discontinued During This Encounter  Medication Reason  . glyBURIDE (DIABETA) 5 MG tablet Error  . niacin (NIASPAN) 500 MG CR tablet Error  . sitaGLIPtin (JANUVIA)  100 MG tablet Error    Patient Active Problem List  Diagnosis  . NEOPLASM OF UNCERTAIN BEHAVIOR OF SKIN  . DIABETES MELLITUS, TYPE II  . HYPERLIPIDEMIA  . HYPERTENSION  . AORTIC STENOSIS/ INSUFFICIENCY, NON-RHEUMATIC  . LBBB  . ALLERGIC RHINITIS  . GERD  . CONSTIPATION  . BENIGN PROSTATIC HYPERTROPHY, WITH OBSTRUCTION  . KNEE PAIN  . LEG CRAMPS  . HEADACHE  . CARDIAC MURMUR  . CAROTID BRUIT  . DYSPNEA ON EXERTION  . CHEST PAIN, EXERTIONAL  . FLATULENCE ERUCTATION AND GAS PAIN    LABS @BMET3 @  @CMPRESULT3 @ @CBC3 @  Lipid Panel     Component Value Date/Time   CHOL 154 12/29/2008 0102   TRIG 189* 12/29/2008 0102   HDL 35* 12/29/2008 0102   CHOLHDL 4.4 Ratio 12/29/2008 0102   VLDL 38 12/29/2008 0102   LDLCALC 81 12/29/2008 0102    ABG No results found for this basename: phart, pco2, pco2art, po2, po2art, hco3, tco2, acidbasedef, o2sat  BNP (last 3 results) No results found for this basename: PROBNP,  in the last 8760 hours Cardiac Panel (last 3 results) No results found for this basename: CKTOTAL, CKMB, TROPONINI, RELINDX,  in the last 72 hours  Iron/TIBC/Ferritin No results found for this basename: iron, tibc, ferritin     EKG Orders placed in visit on 09/25/12  . EKG 12-LEAD     Prior Assessment and Plan Problem List as of 09/25/2012     ICD-9-CM     Cardiology Problems   HYPERLIPIDEMIA   HYPERTENSION   Last Assessment & Plan   07/24/2011 Office Visit Written 07/24/2011 12:56 PM by Jodelle Gross, NP     Currently well controlled. No changes on medications.  He is given refills on all of his cardiac medications.    AORTIC STENOSIS/ INSUFFICIENCY, NON-RHEUMATIC   Last Assessment & Plan   07/24/2011 Office Visit Written 07/24/2011 12:33 PM by Jodelle Gross, NP     Will planned echocardiogram for continued evaluation of stenosis.  He is having what he describes as "'gas pain" which is relieved with TUMs.  Uncertain if this is symptomatic in   stenosis. Doubt this.    LBBB     Other   NEOPLASM OF UNCERTAIN BEHAVIOR OF SKIN   DIABETES MELLITUS, TYPE II   ALLERGIC RHINITIS   GERD   CONSTIPATION   BENIGN PROSTATIC HYPERTROPHY, WITH OBSTRUCTION   KNEE PAIN   LEG CRAMPS   HEADACHE   CARDIAC MURMUR   CAROTID BRUIT   DYSPNEA ON EXERTION   CHEST PAIN, EXERTIONAL   FLATULENCE ERUCTATION AND GAS PAIN       Imaging: No results found.

## 2012-09-25 NOTE — Assessment & Plan Note (Signed)
Cholesterol was not checked yesterday per patient. Will have this drawn on day of stress test for ongoing risk assessment/management.

## 2012-09-25 NOTE — Assessment & Plan Note (Signed)
Chest discomfort may be from AoV stenosis, but not likely. Echo on 08/2012 demonstrated moderately calcified annulus with mild to moderate stenosis. EF 50-55% with mild to moderate hypokinesis fo the distal anteroseptal myocardium.

## 2012-09-25 NOTE — Patient Instructions (Addendum)
Your physician recommends that you schedule a follow-up appointment in: 2 WEEKS   Your physician has requested that you have en exercise stress myoview. For further information please visit [email or web address] Please follow instruction sheet, as given.  Your physician recommends that you return for lab work in: THE MORNING OF YOUR PROCEDURE-SLIPS PROVIDED (LIPIDS,LFTS)  YOUR PHYSICIAN RECOMMENDS THAT YOU SEE Luberta Robertson, MD [facility] CONTACT [phone]

## 2012-09-25 NOTE — Assessment & Plan Note (Signed)
He had cardiac cath in 2008 demonstrating non-obstructive CAD at that time.With continued CVRF to include diabetes, insulin dependent, and recurrent symptoms, will have stress myoview to evaluate for progression of CAD. He is willing to walk on treadmill. Will follow up with him in 2-3 weeks to discuss test results. With LBBB, EKG changes will be difficult to evaluate.

## 2012-10-03 ENCOUNTER — Encounter (HOSPITAL_COMMUNITY): Payer: Self-pay

## 2012-10-03 ENCOUNTER — Ambulatory Visit (HOSPITAL_COMMUNITY)
Admission: RE | Admit: 2012-10-03 | Discharge: 2012-10-03 | Disposition: A | Discharge status: Home / Self Care | Payer: Medicare Other | Source: Ambulatory Visit | Attending: Cardiovascular Disease | Admitting: Cardiovascular Disease

## 2012-10-03 ENCOUNTER — Encounter (HOSPITAL_COMMUNITY)
Admission: RE | Admit: 2012-10-03 | Discharge: 2012-10-03 | Disposition: A | Discharge status: Home / Self Care | Payer: Medicare Other | Source: Ambulatory Visit | Attending: Adult Health | Admitting: Adult Health

## 2012-10-03 DIAGNOSIS — I10 Essential (primary) hypertension: Secondary | ICD-10-CM

## 2012-10-03 DIAGNOSIS — R079 Chest pain, unspecified: Secondary | ICD-10-CM

## 2012-10-03 DIAGNOSIS — R0989 Other specified symptoms and signs involving the circulatory and respiratory systems: Secondary | ICD-10-CM

## 2012-10-03 DIAGNOSIS — R0609 Other forms of dyspnea: Secondary | ICD-10-CM | POA: Insufficient documentation

## 2012-10-03 DIAGNOSIS — I2510 Atherosclerotic heart disease of native coronary artery without angina pectoris: Secondary | ICD-10-CM | POA: Insufficient documentation

## 2012-10-03 DIAGNOSIS — I359 Nonrheumatic aortic valve disorder, unspecified: Secondary | ICD-10-CM

## 2012-10-03 DIAGNOSIS — E7800 Pure hypercholesterolemia, unspecified: Secondary | ICD-10-CM

## 2012-10-03 MED ORDER — TECHNETIUM TC 99M SESTAMIBI - CARDIOLITE
10.0000 | Freq: Once | INTRAVENOUS | Status: AC | PRN
  Administered 2012-10-03: 10 via INTRAVENOUS

## 2012-10-03 MED ORDER — TECHNETIUM TC 99M SESTAMIBI - CARDIOLITE
30.0000 | Freq: Once | INTRAVENOUS | Status: AC | PRN
  Administered 2012-10-03: 12:00:00 30 via INTRAVENOUS

## 2012-10-03 MED ORDER — REGADENOSON 0.4 MG/5ML IV SOLN
INTRAVENOUS | Status: AC
  Administered 2012-10-03: 0.4 mg via INTRAVENOUS
  Filled 2012-10-03: qty 5

## 2012-10-03 MED ORDER — SODIUM CHLORIDE 0.9 % IJ SOLN
INTRAMUSCULAR | Status: AC
  Administered 2012-10-03: 10 mL via INTRAVENOUS
  Filled 2012-10-03: qty 10

## 2012-10-03 NOTE — Progress Notes (Signed)
Stress Lab Nurses Notes - [name]  [name] 10/03/2012 Reason for doing test: CAD, Chest Pain and Dyspnea Type of test: Test Changed unable to reach THR on TM, Lexiscan given. Nurse performing test: Parke Poisson, RN Nuclear Medicine Tech: Lyndel Pleasure Echo Tech: Not Applicable MD performing test: R. Rothbart & Joni Reining NP Family MD: Allayne Butcher NP Test explained and consent signed: yes IV started: 22g jelco, Saline lock flushed, No redness or edema and Saline lock started in radiology Symptoms: Fatigue while on TM & during Lexiscan felt "Funny all over" Treatment/Intervention: None Reason test stopped: protocol completed After recovery IV was: Discontinued via X-ray tech and No redness or edema Patient to return to Nuc. Med at : 12:30 Patient discharged: Home Patient's Condition upon discharge was: stable Comments: During test BP 140/67 & HR 113.  Recovery BP 137/72 & HR 90.  Symptoms resolved in recovery. Erskine Speed T

## 2012-10-04 ENCOUNTER — Encounter: Payer: Self-pay | Admitting: *Deleted

## 2012-10-06 ENCOUNTER — Telehealth: Payer: Self-pay | Admitting: *Deleted

## 2012-10-06 NOTE — Telephone Encounter (Signed)
Called pt to advise results of recent stress test, pt asked why his labs were not drawn, per pt thought they would draw labs in the same place that the test was performed, apologized for the confusion however pt was to take the slips to [facility] prior to his 9:15am test, advised he has a follow up visit in 2 weeks and the lab slips he was given at his 09-25-12 OV are still valid and he can go to [facility] located across from our office in the am per he will need to be fasting, pt understood and will have labs drawn tomorrow

## 2012-10-07 LAB — HEPATIC FUNCTION PANEL
ALT: 34 U/L (ref 0–53)
Bilirubin, Direct: 0.2 mg/dL (ref 0.0–0.3)
Indirect Bilirubin: 0.8 mg/dL (ref 0.0–0.9)
Total Bilirubin: 1 mg/dL (ref 0.3–1.2)

## 2012-10-07 LAB — LIPID PANEL
Cholesterol: 149 mg/dL (ref 0–200)
LDL Cholesterol: 76 mg/dL (ref 0–99)
VLDL: 41 mg/dL — ABNORMAL HIGH (ref 0–40)

## 2012-10-10 ENCOUNTER — Ambulatory Visit: Payer: Medicare Other | Admitting: Adult Health
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
## 2012-10-22 ENCOUNTER — Encounter: Payer: Self-pay | Admitting: Adult Health

## 2012-10-22 ENCOUNTER — Ambulatory Visit (INDEPENDENT_AMBULATORY_CARE_PROVIDER_SITE_OTHER): Payer: Medicare Other | Admitting: Adult Health

## 2012-10-22 VITALS — BP 120/74 | HR 88 | Ht 68.0 in | Wt 165.0 lb

## 2012-10-22 DIAGNOSIS — I359 Nonrheumatic aortic valve disorder, unspecified: Secondary | ICD-10-CM

## 2012-10-22 DIAGNOSIS — E785 Hyperlipidemia, unspecified: Secondary | ICD-10-CM

## 2012-10-22 DIAGNOSIS — I10 Essential (primary) hypertension: Secondary | ICD-10-CM

## 2012-10-22 MED ORDER — PRAVASTATIN SODIUM 40 MG PO TABS: 40.0000 mg | ORAL_TABLET | Freq: Every day | ORAL | Status: DC

## 2012-10-22 MED ORDER — BENAZEPRIL-HYDROCHLOROTHIAZIDE 10-12.5 MG PO TABS: 1.0000 | ORAL_TABLET | Freq: Every day | ORAL | Status: DC

## 2012-10-22 MED ORDER — METOPROLOL SUCCINATE ER 25 MG PO TB24: 25.0000 mg | ORAL_TABLET | Freq: Every day | ORAL | Status: DC

## 2012-10-22 NOTE — Patient Instructions (Addendum)
Your physician recommends that you schedule a follow-up appointment in: 1 year  

## 2012-10-22 NOTE — Assessment & Plan Note (Signed)
Continue to hear significant 2/6 harsh systolic murmur. Echocardiogram completed in January 2014 demonstrated moderately calcified annulus with mild to moderate stenosis. EF 50-55%, with mild to moderate hypokinesis of the distal anterior septal myocardium. He is currently asymptomatic for chest pain, dizziness, or dyspnea on exertion. We will repeat echo in 2 years unless he becomes symptomatic.

## 2012-10-22 NOTE — Progress Notes (Deleted)
Name: [name]    DOB: Jun 05, 1941  Age: 72 y.o.  MR#: [id number]       PCP:  Frazier Richards, PA-C      Insurance: Payor: [employer] [place] [employer]  Plan: BLUE MEDICARE  Product Type: *No Product type*    CC:    Chief Complaint  Patient presents with  . Coronary Artery Disease  . Hypertension    VS Filed Vitals:   10/22/12 1116  BP: 120/74  Pulse: 88  Height: 5\' 8"  (1.727 m)  Weight: 165 lb 0.6 oz (74.862 kg)    Weights Current Weight  10/22/12 165 lb 0.6 oz (74.862 kg)  09/24/12 167 lb (75.751 kg)  09/25/12 168 lb (76.204 kg)    Blood Pressure  BP Readings from Last 3 Encounters:  10/22/12 120/74  09/24/12 100/62  09/25/12 104/64     Admit date:  (Not on file) Last encounter with RMR:  10/10/2012   Allergy Actos; Metformin and related; Morphine; and Statins  Current Outpatient Prescriptions  Medication Sig Dispense Refill  . aspirin 81 MG tablet Take 81 mg by mouth daily.        . benazepril-hydrochlorthiazide (LOTENSIN HCT) 10-12.5 MG per tablet Take 1 tablet by mouth daily.        . Cholecalciferol (VITAMIN D) 2000 UNITS tablet Take 2,000 Units by mouth daily.        . fish oil-omega-3 fatty acids 1000 MG capsule 1 tab po bid        . LANTUS SOLOSTAR 100 UNIT/ML injection 38 Units at bedtime.       . meloxicam (MOBIC) 7.5 MG tablet Take 7.5 mg by mouth as needed.        . metFORMIN (GLUMETZA) 500 MG (MOD) 24 hr tablet Take 500 mg by mouth daily with breakfast.        . metoprolol succinate (TOPROL-XL) 25 MG 24 hr tablet Take 1 tablet (25 mg total) by mouth daily.  30 tablet  12  . NOVOFINE 30G X 8 MM MISC       . omeprazole (PRILOSEC) 20 MG capsule Take 20 mg by mouth daily.        . pravastatin (PRAVACHOL) 40 MG tablet Take 40 mg by mouth daily.         No current facility-administered medications for this visit.    Discontinued Meds:   There are no discontinued medications.  Patient Active Problem List  Diagnosis  . NEOPLASM OF UNCERTAIN  BEHAVIOR OF SKIN  . DIABETES MELLITUS, TYPE II  . HYPERLIPIDEMIA  . HYPERTENSION  . AORTIC STENOSIS/ INSUFFICIENCY, NON-RHEUMATIC  . LBBB  . ALLERGIC RHINITIS  . GERD  . CONSTIPATION  . BENIGN PROSTATIC HYPERTROPHY, WITH OBSTRUCTION  . KNEE PAIN  . LEG CRAMPS  . HEADACHE  . CARDIAC MURMUR  . CAROTID BRUIT  . DYSPNEA ON EXERTION  . CHEST PAIN, EXERTIONAL  . FLATULENCE ERUCTATION AND GAS PAIN    LABS    Component Value Date/Time   NA 137 01/08/2011 1503   NA 138 12/29/2008 0102   NA 138 07/13/2008 2046   K 2.9* 01/08/2011 1503   K 4.4 12/29/2008 0102   K 4.4 07/13/2008 2046   CL 96 01/08/2011 1503   CL 102 12/29/2008 0102   CL 103 07/13/2008 2046   CO2 31 01/08/2011 1503   CO2 22 12/29/2008 0102   CO2 24 07/13/2008 2046   GLUCOSE 85 01/08/2011 1503   GLUCOSE 160*  12/29/2008 0102   GLUCOSE 103* 07/13/2008 2046   BUN 13 01/08/2011 1503   BUN 18 12/29/2008 0102   BUN 13 07/13/2008 2046   CREATININE 0.94 01/08/2011 1503   CREATININE 1.11 12/29/2008 0102   CREATININE 0.89 07/13/2008 2046   CALCIUM 10.6* 01/08/2011 1503   CALCIUM 9.9 12/29/2008 0102   CALCIUM 9.7 07/13/2008 2046   GFRNONAA >60 01/08/2011 1503   GFRAA  Value: >60        The eGFR has been calculated using the MDRD equation. This calculation has not been validated in all clinical situations. eGFR's persistently <60 mL/min signify possible Chronic Kidney Disease. 01/08/2011 1503   CMP     Component Value Date/Time   NA 137 01/08/2011 1503   K 2.9* 01/08/2011 1503   CL 96 01/08/2011 1503   CO2 31 01/08/2011 1503   GLUCOSE 85 01/08/2011 1503   BUN 13 01/08/2011 1503   CREATININE 0.94 01/08/2011 1503   CALCIUM 10.6* 01/08/2011 1503   PROT 6.4 10/07/2012 1020   ALBUMIN 4.2 10/07/2012 1020   AST 14 10/07/2012 1020   ALT 34 10/07/2012 1020   ALKPHOS 69 10/07/2012 1020   BILITOT 1.0 10/07/2012 1020   GFRNONAA >60 01/08/2011 1503   GFRAA  Value: >60        The eGFR has been calculated using the MDRD equation. This calculation has not been validated in all clinical  situations. eGFR's persistently <60 mL/min signify possible Chronic Kidney Disease. 01/08/2011 1503       Component Value Date/Time   WBC 7.5 01/08/2011 1503   WBC 6.5 12/29/2008 0102   WBC 5.6 06/11/2007 2024   HGB 14.7 01/08/2011 1503   HGB 15.9 12/29/2008 0102   HGB 15.8 06/11/2007 2024   HCT 43.7 01/08/2011 1503   HCT 45.2 12/29/2008 0102   HCT 46.3 06/11/2007 2024   MCV 92.0 01/08/2011 1503   MCV 89.0 12/29/2008 0102   MCV 96.1 06/11/2007 2024    Lipid Panel     Component Value Date/Time   CHOL 149 10/07/2012 1020   TRIG 204* 10/07/2012 1020   HDL 32* 10/07/2012 1020   CHOLHDL 4.7 10/07/2012 1020   VLDL 41* 10/07/2012 1020   LDLCALC 76 10/07/2012 1020    ABG No results found for this basename: phart, pco2, pco2art, po2, po2art, hco3, tco2, acidbasedef, o2sat     No results found for this basename: TSH   BNP (last 3 results) No results found for this basename: PROBNP,  in the last 8760 hours Cardiac Panel (last 3 results) No results found for this basename: CKTOTAL, CKMB, TROPONINI, RELINDX,  in the last 72 hours  Iron/TIBC/Ferritin No results found for this basename: iron, tibc, ferritin     EKG Orders placed in visit on 09/25/12  . EKG 12-LEAD     Prior Assessment and Plan Problem List as of 10/22/2012     ICD-9-CM     Cardiology Problems   HYPERLIPIDEMIA   Last Assessment & Plan   09/25/2012 Office Visit Written 09/25/2012  1:28 PM by Jodelle Gross, NP     Cholesterol was not checked yesterday per patient. Will have this drawn on day of stress test for ongoing risk assessment/management.    HYPERTENSION   Last Assessment & Plan   09/25/2012 Office Visit Written 09/25/2012  1:25 PM by Jodelle Gross, NP     Very well controlled at present. Continue ACE with HCTZ. Labs are completed by PCP yesterday.    AORTIC  STENOSIS/ INSUFFICIENCY, NON-RHEUMATIC   Last Assessment & Plan   09/25/2012 Office Visit Written 09/25/2012  1:27 PM by Jodelle Gross, NP     Chest discomfort  may be from AoV stenosis, but not likely. Echo on 08/2012 demonstrated moderately calcified annulus with mild to moderate stenosis. EF 50-55% with mild to moderate hypokinesis fo the distal anteroseptal myocardium.    LBBB     Other   NEOPLASM OF UNCERTAIN BEHAVIOR OF SKIN   DIABETES MELLITUS, TYPE II   ALLERGIC RHINITIS   GERD   CONSTIPATION   BENIGN PROSTATIC HYPERTROPHY, WITH OBSTRUCTION   KNEE PAIN   LEG CRAMPS   HEADACHE   CARDIAC MURMUR   CAROTID BRUIT   DYSPNEA ON EXERTION   CHEST PAIN, EXERTIONAL   Last Assessment & Plan   09/25/2012 Office Visit Written 09/25/2012  1:24 PM by Jodelle Gross, NP     He had cardiac cath in 2008 demonstrating non-obstructive CAD at that time.With continued CVRF to include diabetes, insulin dependent, and recurrent symptoms, will have stress myoview to evaluate for progression of CAD. He is willing to walk on treadmill. Will follow up with him in 2-3 weeks to discuss test results. With LBBB, EKG changes will be difficult to evaluate.     FLATULENCE ERUCTATION AND GAS PAIN       Imaging: Nm Myocar Single W/spect W/wall Motion And Ef  10/05/2012  nm myoview pharmacologic stress  Ordering Physician: Joni Reining  Reading Physician: [place] Bing  Clinical Data: 72 year old gentleman with nonobstructive coronary disease at catheterization in 2008 and aortic stenosis now presenting with exertional chest discomfort.  NUCLEAR MEDICINE ADENOSINE STRESS MYOVIEW STUDY WITH SPECT AND LEFT VENTRIUCLAR EJECTION FRACTION  Radionuclide Data: One-day rest/stress protocol performed with 10/30 mCi of Tc-59m Myoview.  Stress Data: Treadmill exercise initially performed to a workload of seven mets and a heart rate of 119, 80% of age predicted maximum.  Due to failure to achieve an adequate heart rate and the presence of left bundle branch block, the test was changed to pharmacologic stress, and Regadenoson was injected.  EKG: Normal sinus rhythm; left bundle  branch block.  Scintigraphic Data: Acquisition notable for moderate diaphragmatic attenuation.  Left ventricular size was normal.  On tomographic images reconstructed in standard planes, there was a small area of thinning at the base of the inferior wall.  This was not numerically significant by quantitative analysis, and no reversibility was apparent.  The gated reconstruction demonstrated normal regional and global LV systolic function as well as normal systolic accentuation of activity throughout.  Estimated ejection fraction was 56%.  IMPRESSION: Negative pharmacologic stress nuclear myocardial study.  EKG was nondiagnostic due to the presence of a left bundle branch block at baseline.  Left ventricular size and function were normal.  On scintigraphic imaging, diaphragmatic attenuation was apparent without evidence for ischemia or infarction.  Other findings as noted.   Original Report Authenticated By: [place] Bing

## 2012-10-22 NOTE — Assessment & Plan Note (Signed)
He has had recent lipid studies completed on 10/07/2012. Total cholesterol 149 HDL 32 triglycerides 204 LDL 76. I provided him with a copy of this for his own records. LFTs were found to be within normal limits. He offers no myalgia type symptoms. Continue him on current medication regimen without changes.

## 2012-10-22 NOTE — Assessment & Plan Note (Signed)
Excellent control of blood pressure. He will continue on benazepril HCTZ, metoprolol 25 mg daily. He continues to have labs through his primary care physician [facility]. No changes in medication regimen at this time. We will follow him in one year unless he is symptomatic.

## 2012-12-24 ENCOUNTER — Ambulatory Visit (INDEPENDENT_AMBULATORY_CARE_PROVIDER_SITE_OTHER): Payer: Medicare Other | Admitting: Physician Assistant

## 2012-12-24 ENCOUNTER — Encounter: Payer: Self-pay | Admitting: Physician Assistant

## 2012-12-24 VITALS — BP 110/70 | HR 72 | Temp 97.6°F | Resp 18 | Ht 64.5 in | Wt 162.0 lb

## 2012-12-24 DIAGNOSIS — E785 Hyperlipidemia, unspecified: Secondary | ICD-10-CM

## 2012-12-24 DIAGNOSIS — K219 Gastro-esophageal reflux disease without esophagitis: Secondary | ICD-10-CM

## 2012-12-24 DIAGNOSIS — I359 Nonrheumatic aortic valve disorder, unspecified: Secondary | ICD-10-CM

## 2012-12-24 DIAGNOSIS — N401 Enlarged prostate with lower urinary tract symptoms: Secondary | ICD-10-CM

## 2012-12-24 DIAGNOSIS — I10 Essential (primary) hypertension: Secondary | ICD-10-CM

## 2012-12-24 DIAGNOSIS — R0989 Other specified symptoms and signs involving the circulatory and respiratory systems: Secondary | ICD-10-CM

## 2012-12-24 DIAGNOSIS — E119 Type 2 diabetes mellitus without complications: Secondary | ICD-10-CM

## 2012-12-24 DIAGNOSIS — R011 Cardiac murmur, unspecified: Secondary | ICD-10-CM

## 2012-12-24 LAB — HEMOGLOBIN A1C, FINGERSTICK: Hgb A1C (fingerstick): 6.6 % — ABNORMAL HIGH (ref <5.7)

## 2012-12-24 NOTE — Progress Notes (Signed)
Patient ID: [name] MRN: [id number], DOB: [birth year], 72 y.o. Date of Encounter: @DATE @  Chief Complaint:  Chief Complaint  Patient presents with  . 3 month check up    HPI: 72 y.o. year old male  presents for routine f/u of :  1-DM: Pt forgot and left BS log at home. Says he has increased Lantus to 40 units. AM: 97-120. HS: sometimes up to 160-180 "if gets hungry and has a snack"  Has diarrhea 1-2 times per week but not every day.  2-HTN: Taking meds as directed. No adv effects. No lightheadedness, presyncope. No edema.   3-HLD: Taking med as directed. No myalgias or other edv effects.  4- No chest pressure, heaviness, tightness, increased SOB even with exertion.  Says he had stress test in February that was good. Has another Echo and Carotid Doppler next week.    Past Medical History  Diagnosis Date  . Hyperlipidemia   . Hypertension   . Vitamin D deficiency   . Diabetes mellitus   . GERD (gastroesophageal reflux disease)   . BPH (benign prostatic hypertrophy)   . History of cataract   . Constipation   . CAD (coronary artery disease)   . Aortic stenosis, mild   . Cancer     spindle cell cancer of left ear  . Nephrolithiasis      Home Meds: See attached medication section for current medication list. Any medications entered into computer today will not appear on this note's list. The medications listed below were entered prior to today. Current Outpatient Prescriptions on File Prior to Visit  Medication Sig Dispense Refill  . aspirin 81 MG tablet Take 81 mg by mouth daily.        . benazepril-hydrochlorthiazide (LOTENSIN HCT) 10-12.5 MG per tablet Take 1 tablet by mouth daily.  90 tablet  3  . Cholecalciferol (VITAMIN D) 2000 UNITS tablet Take 2,000 Units by mouth daily.        . fish oil-omega-3 fatty acids 1000 MG capsule 1 tab po bid        . LANTUS SOLOSTAR 100 UNIT/ML injection 40 Units at bedtime.       . meloxicam (MOBIC) 7.5 MG tablet Take 7.5 mg by  mouth as needed.        . metFORMIN (GLUMETZA) 500 MG (MOD) 24 hr tablet Take 500 mg by mouth daily with breakfast.        . metoprolol succinate (TOPROL-XL) 25 MG 24 hr tablet Take 1 tablet (25 mg total) by mouth daily.  90 tablet  3  . NOVOFINE 30G X 8 MM MISC       . omeprazole (PRILOSEC) 20 MG capsule Take 20 mg by mouth daily.        . pravastatin (PRAVACHOL) 40 MG tablet Take 1 tablet (40 mg total) by mouth daily.  90 tablet  3   No current facility-administered medications on file prior to visit.    Allergies:  Allergies  Allergen Reactions  . Actos (Pioglitazone) Other (See Comments)    Cramps in legs   . Metformin And Related Diarrhea  . Morphine     REACTION: hives  . Statins     REACTION: leg cramps but tolerates pravastatin    History   Social History  . Marital Status: Married    Spouse Name: N/A    Number of Children: N/A  . Years of Education: N/A   Occupational History  . Not on file.  Social History Main Topics  . Smoking status: Former Smoker -- 1.00 packs/day for 30 years  . Smokeless tobacco: Not on file     Comment: Quit >15 years ago  . Alcohol Use: No  . Drug Use: No  . Sexually Active: Not on file   Other Topics Concern  . Not on file   Social History Narrative   Married for >44 years    Family History  Problem Relation Age of Onset  . Heart disease Mother   . Congestive Heart Failure Father      Review of Systems:  See HPI for pertinent ROS. All other ROS negative.    Physical Exam: Blood pressure 110/70, pulse 72, temperature 97.6 F (36.4 C), temperature source Oral, resp. rate 18, height 5' 4.5" (1.638 m), weight 162 lb (73.483 kg)., Body mass index is 27.39 kg/(m^2). General: WNWD WM. Very nice. Appears in no acute distress. Neck: Supple. No thyromegaly. No lymphadenopathy.Poss soft bruit vs radiation of murmu to neck. Lungs: Clear bilaterally to auscultation without wheezes, rales, or rhonchi. Breathing is  unlabored. Heart: RRR with S1 S2. IV/VI murmur Right 2nd ICS. II/VI murmur at apex Abdomen: Soft, non-tender, non-distended with normoactive bowel sounds. No hepatomegaly. No rebound/guarding. No obvious abdominal masses. Musculoskeletal:  Strength and tone normal for age. Extremities/Skin: Warm and dry. No clubbing or cyanosis. No edema. No rashes or suspicious lesions. Neuro: Alert and oriented X 3. Moves all extremities spontaneously. Gait is normal. CNII-XII grossly in tact. Psych:  Responds to questions appropriately with a normal affect. DIABETIC FOOT EXAM; SEE ATTACHED SECTION ALSO: Inspection normal. He provides great care to his feet. No callouses, sores, open wounds.  2+ Full Dorsalis Pedis Pulses Bilaterally. No palpable Posterior Tibialis Pulse Bilaterally.     ASSESSMENT AND PLAN:  72 y.o. year old male with  1. DIABETES MELLITUS, TYPE II - Microalbumin, urine  - Hemoglobin A1C, fingerstick  He provides excellent foot care. Reminded him of proper foot care, wearing shoes at all times etc.  He is overdue for ophthalmology exam. He states he cannot afford this. High copay. "Will do it when he has the money' On ACE Inh, Statin In past Metformin dose decreased sec to diarrhea. After that pt reported that diarrhea had resolved.Today reports diarrhea 1-2/week. Discussed changing Met to something else, but would be more expensive. Pt says diarrhea not that bad-will cont same.   2. HYPERTENSION At goal. Actually, low but no lightheadedness. On ACE Inh  3. HYPERLIPIDEMIA At goal. Cont current meds. FLP at goal 8/13. LFT nml 2/14. Will recheck both at next OV.  4. AORTIC STENOSIS/ INSUFFICIENCY, NON-RHEUMATIC Followed by Cardiology-Has another Echo next week per pt  5. CARDIAC MURMUR See # 4  6. CAROTID BRUIT May be sec to radiation of murmur. Has had carotid Artery u/s but I do not have report. Pt states has f/u Carotid U/S scheduled for next week also   7. BENIGN PROSTATIC  HYPERTROPHY, WITH OBSTRUCTION   8. GERD Controlled with omeprazole.   9. Screening PSA: 220/14-Nml 10. Screening Colonoscopy: 06/03/09-"Repeat 10-15 years" 11. Immunizations:   A. Pneumovax: Pt says he had at age 34 by prior PCP  B. Tetanus: Pt checked with insurance-$40-pt defers  C. Zostavax; Pt checked with insurance. Too expensive-Pt defers.   Signed, [address] [place], [place], [facility] 12/24/2012 11:02 AM

## 2012-12-25 LAB — MICROALBUMIN, URINE: Microalb, Ur: 1.19 mg/dL (ref 0.00–1.89)

## 2012-12-30 ENCOUNTER — Encounter: Payer: Self-pay | Admitting: Physician Assistant

## 2012-12-30 ENCOUNTER — Encounter (INDEPENDENT_AMBULATORY_CARE_PROVIDER_SITE_OTHER): Payer: Medicare Other

## 2012-12-30 DIAGNOSIS — I6529 Occlusion and stenosis of unspecified carotid artery: Secondary | ICD-10-CM

## 2013-01-19 ENCOUNTER — Encounter (HOSPITAL_COMMUNITY): Payer: Self-pay | Admitting: Cardiology

## 2013-03-26 ENCOUNTER — Encounter: Payer: Self-pay | Admitting: Physician Assistant

## 2013-03-26 ENCOUNTER — Ambulatory Visit (INDEPENDENT_AMBULATORY_CARE_PROVIDER_SITE_OTHER): Payer: Medicare Other | Admitting: Physician Assistant

## 2013-03-26 VITALS — BP 120/70 | HR 68 | Temp 97.0°F | Resp 18 | Ht 64.0 in | Wt 160.0 lb

## 2013-03-26 DIAGNOSIS — K219 Gastro-esophageal reflux disease without esophagitis: Secondary | ICD-10-CM

## 2013-03-26 DIAGNOSIS — E785 Hyperlipidemia, unspecified: Secondary | ICD-10-CM

## 2013-03-26 DIAGNOSIS — I10 Essential (primary) hypertension: Secondary | ICD-10-CM

## 2013-03-26 DIAGNOSIS — R011 Cardiac murmur, unspecified: Secondary | ICD-10-CM

## 2013-03-26 DIAGNOSIS — E119 Type 2 diabetes mellitus without complications: Secondary | ICD-10-CM

## 2013-03-26 DIAGNOSIS — R0989 Other specified symptoms and signs involving the circulatory and respiratory systems: Secondary | ICD-10-CM

## 2013-03-26 LAB — COMPLETE METABOLIC PANEL WITH GFR
CO2: 26 mEq/L (ref 19–32)
Creat: 0.91 mg/dL (ref 0.50–1.35)
GFR, Est African American: >89 mL/min
GFR, Est Non African American: 83 mL/min
Glucose, Bld: 94 mg/dL (ref 70–99)
Total Bilirubin: 1.2 mg/dL (ref 0.3–1.2)

## 2013-03-26 LAB — LIPID PANEL
HDL: 37 mg/dL — ABNORMAL LOW (ref >39)
Triglycerides: 159 mg/dL — ABNORMAL HIGH (ref <150)

## 2013-03-26 NOTE — Progress Notes (Signed)
Patient ID: [name] MRN: [id number], DOB: Oct 05, 1940, 72 y.o. Date of Encounter: @DATE @  Chief Complaint:  Chief Complaint  Patient presents with  . Follow-up    HPI: 72 y.o. year old white male  presents For routine followup of:  #1 diabetes mellitus: Patient forgotten what his blood sugar log at home. He is still taking Lantus 40 units. In the morning he is still getting around 100-124 blood sugar reading. If he ate a snack the night before and sometimes the fasting sugars are more like 160-180. He says he still has diarrhea one to 2 times per week and this is all. He was to continue the current dose of metformin and says that the diarrhea is not a problem.  #2 hypertension: He is taking blood pressure meds as directed with no adverse effects.  #3 hyperlipidemia: He is taking his pravastatin as directed. He has no myalgias or other adverse effects.  He has no chest pressure heaviness tightness or increased shortness of breath or dyspnea on exertion. He continues to follow up with cardiology and has had recent echocardiogram and carotid Dopplers. Per his report.   Past Medical History  Diagnosis Date  . Hyperlipidemia   . Hypertension   . Vitamin D deficiency   . Diabetes mellitus   . GERD (gastroesophageal reflux disease)   . BPH (benign prostatic hypertrophy)   . History of cataract   . Constipation   . CAD (coronary artery disease)   . Aortic stenosis, mild   . Cancer     spindle cell cancer of left ear  . Nephrolithiasis      Home Meds: See attached medication section for current medication list. Any medications entered into computer today will not appear on this note's list. The medications listed below were entered prior to today. Current Outpatient Prescriptions on File Prior to Visit  Medication Sig Dispense Refill  . aspirin 81 MG tablet Take 81 mg by mouth daily.        . benazepril-hydrochlorthiazide (LOTENSIN HCT) 10-12.5 MG per tablet Take 1 tablet by  mouth daily.  90 tablet  3  . Cholecalciferol (VITAMIN D) 2000 UNITS tablet Take 2,000 Units by mouth daily.        . fish oil-omega-3 fatty acids 1000 MG capsule 1 tab po bid        . LANTUS SOLOSTAR 100 UNIT/ML injection 40 Units at bedtime.       . meloxicam (MOBIC) 7.5 MG tablet Take 7.5 mg by mouth as needed.        . metFORMIN (GLUMETZA) 500 MG (MOD) 24 hr tablet Take 500 mg by mouth daily with breakfast.        . metoprolol succinate (TOPROL-XL) 25 MG 24 hr tablet Take 1 tablet (25 mg total) by mouth daily.  90 tablet  3  . NOVOFINE 30G X 8 MM MISC       . omeprazole (PRILOSEC) 20 MG capsule Take 20 mg by mouth daily.        . pravastatin (PRAVACHOL) 40 MG tablet Take 1 tablet (40 mg total) by mouth daily.  90 tablet  3   No current facility-administered medications on file prior to visit.    Allergies:  Allergies  Allergen Reactions  . Actos [Pioglitazone] Other (See Comments)    Cramps in legs   . Metformin And Related Diarrhea  . Morphine     REACTION: hives  . Statins     REACTION: leg cramps  but tolerates pravastatin    History   Social History  . Marital Status: Married    Spouse Name: N/A    Number of Children: N/A  . Years of Education: N/A   Occupational History  . Not on file.   Social History Main Topics  . Smoking status: Former Smoker -- 1.00 packs/day for 30 years  . Smokeless tobacco: Not on file     Comment: Quit >15 years ago  . Alcohol Use: No  . Drug Use: No  . Sexual Activity: Not on file   Other Topics Concern  . Not on file   Social History Narrative   Married for >44 years    Family History  Problem Relation Age of Onset  . Heart disease Mother   . Congestive Heart Failure Father      Review of Systems:  See HPI for pertinent ROS. All other ROS negative.    Physical Exam: Blood pressure 120/70, pulse 68, temperature 97 F (36.1 C), resp. rate 18, height 5\' 4"  (1.626 m), weight 160 lb (72.576 kg)., Body mass index is 27.45  kg/(m^2). General:well-nourished well-developed white male. Appears in no acute distress. Neck: Supple. No thyromegaly. No lymphadenopathy.he does have carotid bruit loudest on the right. Lungs: Clear bilaterally to auscultation without wheezes, rales, or rhonchi. Breathing is unlabored. Heart: RRR with S1 S2. IV/VI murmur at the right second intercostal space.II/VI murmur at the apex. Abdomen: Soft, non-tender, non-distended with normoactive bowel sounds. No hepatomegaly. No rebound/guarding. No obvious abdominal masses. Musculoskeletal:  Strength and tone normal for age. Extremities/Skin: Warm and dry. No clubbing or cyanosis. No edema. No rashes or suspicious lesions. Neuro: Alert and oriented X 3. Moves all extremities spontaneously. Gait is normal. CNII-XII grossly in tact. Psych:  Responds to questions appropriately with a normal affect. Diabetic foot exam: See attached section. Entered today.     ASSESSMENT AND PLAN:  72 y.o. year old male with  1. DIABETES MELLITUS, TYPE II  He provides excellent foot care. Reminded him of proper foot care and wearing shoes at all times. He is overdue for ophthalmology exam. He states he cannot afford this. He has a high co-pay. He is on ACE inhibitor and statin therapy. In the past the metformin was decreased secondary to diarrhea. He still has some diarrhea but says that he must continue the current dose and does not need to stop the medication. - COMPLETE METABOLIC PANEL WITH GFR - Hemoglobin A1c  2. HYPERTENSION BP at goal. On ACE inhibitor. - COMPLETE METABOLIC PANEL WITH GFR  3. HYPERLIPIDEMIA On statin therapy. - COMPLETE METABOLIC PANEL WITH GFR - Lipid panel  4. GERD  5. CARDIAC MURMUR This is followed by cardiology.  6. CAROTID BRUIT He has had carotid Dopplers and is followed by cardiology.  #7 screening PSA last was on 09/25/2012 was normal.  #8 screening colonoscopy: 1029 10 repeat 10-15 years  #9 immunizations: A.  Pneumovax: Patient says he had this at age 72 but his prior PCP B. Tetanus: Patient check with his insurance and was on a cost $40 the patient defers  C. Zostavax patient check with insurance. 2 expensive. Patient defers.   [address] [place], [place], [facility] 03/26/2013 5:25 PM

## 2013-03-27 ENCOUNTER — Ambulatory Visit: Payer: Medicare Other | Admitting: Physician Assistant

## 2013-05-25 ENCOUNTER — Telehealth: Payer: Self-pay | Admitting: Physician Assistant

## 2013-05-25 MED ORDER — INSULIN GLARGINE 100 UNIT/ML SOLOSTAR PEN: 40.0000 [IU] | PEN_INJECTOR | Freq: Every day | SUBCUTANEOUS | Status: DC

## 2013-05-25 NOTE — Telephone Encounter (Signed)
solostar pens refilled

## 2013-05-25 NOTE — Telephone Encounter (Signed)
Needs Extra Lantus pen

## 2013-05-26 ENCOUNTER — Telehealth: Payer: Self-pay | Admitting: Physician Assistant

## 2013-05-26 NOTE — Telephone Encounter (Signed)
Do you have any samples of Lantus . Can't afford his medicine right now.

## 2013-05-26 NOTE — Telephone Encounter (Signed)
Have some samples for pt.  Called him to come get.

## 2013-06-29 ENCOUNTER — Ambulatory Visit (INDEPENDENT_AMBULATORY_CARE_PROVIDER_SITE_OTHER): Payer: Medicare Other | Admitting: Physician Assistant

## 2013-06-29 ENCOUNTER — Encounter: Payer: Self-pay | Admitting: Physician Assistant

## 2013-06-29 VITALS — BP 110/90 | HR 72 | Temp 98.5°F | Resp 18 | Wt 155.0 lb

## 2013-06-29 DIAGNOSIS — K219 Gastro-esophageal reflux disease without esophagitis: Secondary | ICD-10-CM

## 2013-06-29 DIAGNOSIS — Z23 Encounter for immunization: Secondary | ICD-10-CM

## 2013-06-29 DIAGNOSIS — R011 Cardiac murmur, unspecified: Secondary | ICD-10-CM

## 2013-06-29 DIAGNOSIS — I10 Essential (primary) hypertension: Secondary | ICD-10-CM

## 2013-06-29 DIAGNOSIS — I359 Nonrheumatic aortic valve disorder, unspecified: Secondary | ICD-10-CM

## 2013-06-29 DIAGNOSIS — N401 Enlarged prostate with lower urinary tract symptoms: Secondary | ICD-10-CM

## 2013-06-29 DIAGNOSIS — R0989 Other specified symptoms and signs involving the circulatory and respiratory systems: Secondary | ICD-10-CM

## 2013-06-29 DIAGNOSIS — E785 Hyperlipidemia, unspecified: Secondary | ICD-10-CM

## 2013-06-29 DIAGNOSIS — E119 Type 2 diabetes mellitus without complications: Secondary | ICD-10-CM

## 2013-06-29 NOTE — Progress Notes (Signed)
Patient ID: [name] MRN: [id number], DOB: 11-06-1940, 72 y.o. Date of Encounter: @DATE @  Chief Complaint:  Chief Complaint  Patient presents with  . 3 mth check up    is fasting    HPI: 72 y.o. year old white male  presents for routine follow up office visit.  #1 diabetes mellitus: He reports that he stopped Lantus secondary to cost because he hit the donut hole. Now that he is at the donut hole, Lantus cost him $161 per month. He brought in his blood sugar log. These readings are OFF Lantus. Fasting blood sugars range 69-124. 2 hours after dinner: 116-158.  Initially, I thought that there was no way that this could be correct. He had been on 30 units of Lantus daily. I did not understand how he could have possibly been on 40 units of Lantus and not been having hypoglycemia if these are his blood sugar readings off of Lantus. I reviewed his last labs in August 2014 A1c was 6.1.  However, I did review his prior weights. February 2014 weight was 167. Today weight 155. Patient says he has decreased his dietary intake. Says that this is secondary to finances also. Says " I just have to eat what I have." "I can't afford anything more."  #2 hypertension: He is taking blood pressure medications as directed with no adverse effects.  #3 hyperlipidemia: He is taking his pravastatin as directed. He has no myalgias or other adverse effects.  He has no chest pressure, heaviness, tightness, or increased shortness of breath or dyspnea on exertion. He continues to follow with cardiology and has had echocardiogram and carotid Dopplers earlier this year per his report.   Past Medical History  Diagnosis Date  . Hyperlipidemia   . Hypertension   . Vitamin D deficiency   . Diabetes mellitus   . GERD (gastroesophageal reflux disease)   . BPH (benign prostatic hypertrophy)   . History of cataract   . Constipation   . CAD (coronary artery disease)   . Aortic stenosis, mild   . Cancer       spindle cell cancer of left ear  . Nephrolithiasis      Home Meds: See attached medication section for current medication list. Any medications entered into computer today will not appear on this note's list. The medications listed below were entered prior to today. Current Outpatient Prescriptions on File Prior to Visit  Medication Sig Dispense Refill  . aspirin 81 MG tablet Take 81 mg by mouth daily.        . benazepril-hydrochlorthiazide (LOTENSIN HCT) 10-12.5 MG per tablet Take 1 tablet by mouth daily.  90 tablet  3  . Cholecalciferol (VITAMIN D) 2000 UNITS tablet Take 2,000 Units by mouth daily.        . fish oil-omega-3 fatty acids 1000 MG capsule 1 tab po bid        . Insulin Glargine (LANTUS SOLOSTAR) 100 UNIT/ML SOPN Inject 40 Units into the skin at bedtime.  10 mL  3  . meloxicam (MOBIC) 7.5 MG tablet Take 7.5 mg by mouth as needed.        . metFORMIN (GLUMETZA) 500 MG (MOD) 24 hr tablet Take 500 mg by mouth daily with breakfast.        . metoprolol succinate (TOPROL-XL) 25 MG 24 hr tablet Take 1 tablet (25 mg total) by mouth daily.  90 tablet  3  . NOVOFINE 30G X 8 MM MISC       .  omeprazole (PRILOSEC) 20 MG capsule Take 20 mg by mouth daily.        . pravastatin (PRAVACHOL) 40 MG tablet Take 1 tablet (40 mg total) by mouth daily.  90 tablet  3   No current facility-administered medications on file prior to visit.    Allergies:  Allergies  Allergen Reactions  . Actos [Pioglitazone] Other (See Comments)    Cramps in legs   . Metformin And Related Diarrhea  . Morphine     REACTION: hives  . Statins     REACTION: leg cramps but tolerates pravastatin    History   Social History  . Marital Status: Married    Spouse Name: N/A    Number of Children: N/A  . Years of Education: N/A   Occupational History  . Not on file.   Social History Main Topics  . Smoking status: Former Smoker -- 1.00 packs/day for 30 years  . Smokeless tobacco: Not on file     Comment:  Quit >15 years ago  . Alcohol Use: No  . Drug Use: No  . Sexual Activity: Not on file   Other Topics Concern  . Not on file   Social History Narrative   Married for >44 years    Family History  Problem Relation Age of Onset  . Heart disease Mother   . Congestive Heart Failure Father      Review of Systems:  See HPI for pertinent ROS. All other ROS negative.    Physical Exam: Blood pressure 110/90, pulse 72, temperature 98.5 F (36.9 C), temperature source Oral, resp. rate 18, weight 155 lb (70.308 kg)., Body mass index is 26.59 kg/(m^2). General: WNWD WM  Appears in no acute distress. Neck: Supple. No thyromegaly. No lymphadenopathy. He does have carotid bruits loudest on the right. Lungs: Clear bilaterally to auscultation without wheezes, rales, or rhonchi. Breathing is unlabored. Heart: RRR with S1 S2. I/VI murmur at the right second intercostal space. II/VI murmur at the apex. Abdomen: Soft, non-tender, non-distended with normoactive bowel sounds. No hepatomegaly. No rebound/guarding. No obvious abdominal masses. Musculoskeletal:  Strength and tone normal for age. Extremities/Skin: Warm and dry. No clubbing or cyanosis. No edema. No rashes or suspicious lesions. Neuro: Alert and oriented X 3. Moves all extremities spontaneously. Gait is normal. CNII-XII grossly in tact. Psych:  Responds to questions appropriately with a normal affect. Diabetic foot exam: Inspection is normal with no open wounds or sores. Sensation is intact and normal.  Right DP: 1+ right PT: 2+  Left DP 2+ Left PT: 0     ASSESSMENT AND PLAN:  72 y.o. year old male with  1. DIABETES MELLITUS, TYPE II - Hemoglobin A1C, fingerstick Last microalbumin: 12/2012  Note in history of present illness he has stopped his Lantus secondary to finances and donut hole He provides excellent foot care. Reminded him of proper foot care and wearing shoes at all times. He is overdue for ophthalmology exam. He states  that he cannot afford this. He has a high co-pay. He is on ACE inhibitor and statin therapy. In the past metformin was decreased secondary to diarrhea. He still has some diarrhea but says that he does not need to stop the medication and he is tolerating current dose.  2. HYPERLIPIDEMIA  CMET and lipid panel were performed 03/26/13.  3. HYPERTENSION Blood pressure has been well controlled at goal. Continue current medication. He is on ACE inhibitor. CMET was performed 03/26/13   4. GERD  5. BENIGN  PROSTATIC HYPERTROPHY, WITH OBSTRUCTION  6. AORTIC STENOSIS/ INSUFFICIENCY, NON-RHEUMATIC   he sees cardiology routinely. He had echocardiogram and carotid artery Dopplers routinely with them. 7. CARDIAC MURMUR 8. CAROTID BRUIT  #9 screening PSA: Last 09/25/12: Normal  #10 screening colonoscopy: 06/03/2009. Repeat 10-15 years  #11 immunizations: Pneumovax: Patient says he had this at age 25 with his prior PCP B tetanus: Patient checked with his insurance and the cost was going to be $40 to the patient's he refused and deferred. Cannot afford C Zostavax: Patient checked with insurance. This was too expensive. Patient defers. D. Influenza vaccine: Discussed this today and he is agreeable to receive influenza vaccine.  9. Need for prophylactic vaccination and inoculation against influenza - Flu Vaccine QUAD 36+ mos PF IM (Fluarix)  10. Financial distress: He reports that he is currently in the donut hole. I reviewed his current medications to see if there are any other medications that may be contributing to the donut hole. The only one that I thought it could be contributing to the donut hole with his Mobic. However he says that he does not take this on a routine basis and has not even refilled it in the past couple months. All other medications are generic and should be an expensive. I do not see any other medication changes that I can make to help him financially.    plan regular office visit  in 3 months sooner if needed.  Signed, [address] [place], [place], [facility] 06/29/2013 10:10 AM

## 2013-06-30 ENCOUNTER — Emergency Department (HOSPITAL_COMMUNITY)
Admission: EM | Admit: 2013-06-30 | Discharge: 2013-06-30 | Disposition: A | Discharge status: Home / Self Care | Payer: Medicare Other | Attending: Emergency Medicine | Admitting: Emergency Medicine

## 2013-06-30 ENCOUNTER — Encounter (HOSPITAL_COMMUNITY): Payer: Self-pay | Admitting: Emergency Medicine

## 2013-06-30 DIAGNOSIS — Z7982 Long term (current) use of aspirin: Secondary | ICD-10-CM | POA: Insufficient documentation

## 2013-06-30 DIAGNOSIS — Z79899 Other long term (current) drug therapy: Secondary | ICD-10-CM | POA: Insufficient documentation

## 2013-06-30 DIAGNOSIS — E119 Type 2 diabetes mellitus without complications: Secondary | ICD-10-CM | POA: Insufficient documentation

## 2013-06-30 DIAGNOSIS — Z8669 Personal history of other diseases of the nervous system and sense organs: Secondary | ICD-10-CM | POA: Insufficient documentation

## 2013-06-30 DIAGNOSIS — R6883 Chills (without fever): Secondary | ICD-10-CM | POA: Insufficient documentation

## 2013-06-30 DIAGNOSIS — Z85828 Personal history of other malignant neoplasm of skin: Secondary | ICD-10-CM | POA: Insufficient documentation

## 2013-06-30 DIAGNOSIS — E559 Vitamin D deficiency, unspecified: Secondary | ICD-10-CM | POA: Insufficient documentation

## 2013-06-30 DIAGNOSIS — E785 Hyperlipidemia, unspecified: Secondary | ICD-10-CM | POA: Insufficient documentation

## 2013-06-30 DIAGNOSIS — Z87442 Personal history of urinary calculi: Secondary | ICD-10-CM | POA: Insufficient documentation

## 2013-06-30 DIAGNOSIS — Z87891 Personal history of nicotine dependence: Secondary | ICD-10-CM | POA: Insufficient documentation

## 2013-06-30 DIAGNOSIS — Z791 Long term (current) use of non-steroidal anti-inflammatories (NSAID): Secondary | ICD-10-CM | POA: Insufficient documentation

## 2013-06-30 DIAGNOSIS — I2510 Atherosclerotic heart disease of native coronary artery without angina pectoris: Secondary | ICD-10-CM | POA: Insufficient documentation

## 2013-06-30 DIAGNOSIS — Z794 Long term (current) use of insulin: Secondary | ICD-10-CM | POA: Insufficient documentation

## 2013-06-30 DIAGNOSIS — I10 Essential (primary) hypertension: Secondary | ICD-10-CM | POA: Insufficient documentation

## 2013-06-30 DIAGNOSIS — N390 Urinary tract infection, site not specified: Secondary | ICD-10-CM | POA: Insufficient documentation

## 2013-06-30 DIAGNOSIS — K219 Gastro-esophageal reflux disease without esophagitis: Secondary | ICD-10-CM | POA: Insufficient documentation

## 2013-06-30 DIAGNOSIS — Z95818 Presence of other cardiac implants and grafts: Secondary | ICD-10-CM | POA: Insufficient documentation

## 2013-06-30 LAB — URINE MICROSCOPIC-ADD ON

## 2013-06-30 LAB — URINALYSIS, ROUTINE W REFLEX MICROSCOPIC
Bilirubin Urine: NEGATIVE
Glucose, UA: NEGATIVE mg/dL
Ketones, ur: NEGATIVE mg/dL
Specific Gravity, Urine: 1.02 (ref 1.005–1.030)
Urobilinogen, UA: 0.2 mg/dL (ref 0.0–1.0)
pH: 5.5 (ref 5.0–8.0)

## 2013-06-30 LAB — BASIC METABOLIC PANEL
CO2: 23 mEq/L (ref 19–32)
Creatinine, Ser: 1.16 mg/dL (ref 0.50–1.35)
Glucose, Bld: 167 mg/dL — ABNORMAL HIGH (ref 70–99)
Potassium: 3.6 mEq/L (ref 3.5–5.1)
Sodium: 136 mEq/L (ref 135–145)

## 2013-06-30 LAB — CBC WITH DIFFERENTIAL/PLATELET
Eosinophils Absolute: 0.1 10*3/uL (ref 0.0–0.7)
Eosinophils Relative: 0 % (ref 0–5)
HCT: 43.2 % (ref 39.0–52.0)
Hemoglobin: 15.1 g/dL (ref 13.0–17.0)
Lymphs Abs: 0.7 10*3/uL (ref 0.7–4.0)
MCH: 32.1 pg (ref 26.0–34.0)
MCV: 91.7 fL (ref 78.0–100.0)
Monocytes Relative: 3 % (ref 3–12)
Neutro Abs: 10.5 10*3/uL — ABNORMAL HIGH (ref 1.7–7.7)
RBC: 4.71 MIL/uL (ref 4.22–5.81)

## 2013-06-30 LAB — TROPONIN I: Troponin I: <0.3 ng/mL (ref <0.30)

## 2013-06-30 MED ORDER — SODIUM CHLORIDE 0.9 % IV BOLUS (SEPSIS)
1000.0000 mL | Freq: Once | INTRAVENOUS | Status: AC
  Administered 2013-06-30: 1000 mL via INTRAVENOUS

## 2013-06-30 MED ORDER — ACETAMINOPHEN 500 MG PO TABS
1000.0000 mg | ORAL_TABLET | Freq: Once | ORAL | Status: AC
  Administered 2013-06-30: 1000 mg via ORAL
  Filled 2013-06-30: qty 2

## 2013-06-30 MED ORDER — CIPROFLOXACIN HCL 500 MG PO TABS: 500.0000 mg | ORAL_TABLET | Freq: Two times a day (BID) | ORAL | Status: DC

## 2013-06-30 MED ORDER — CIPROFLOXACIN HCL 250 MG PO TABS
500.0000 mg | ORAL_TABLET | Freq: Once | ORAL | Status: AC
  Administered 2013-06-30: 500 mg via ORAL
  Filled 2013-06-30: qty 2
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
## 2013-06-30 NOTE — ED Notes (Signed)
Pt c/o weakness in both legs and chills starting around 1:00 today.  Denies pain.  Reports had flu shot yesterday.

## 2013-07-02 LAB — URINE CULTURE: Colony Count: 2000

## 2013-07-21 ENCOUNTER — Encounter: Payer: Self-pay | Admitting: Physician Assistant

## 2013-07-21 NOTE — Telephone Encounter (Signed)
I see no where in his chart where anyone here tried to call

## 2013-07-21 NOTE — Telephone Encounter (Signed)
Pt had a missed call from here and he is not sure if you tried to call Call back number is [phone]

## 2013-07-28 NOTE — Telephone Encounter (Signed)
This encounter was created in error - please disregard.

## 2013-08-25 ENCOUNTER — Other Ambulatory Visit: Payer: Self-pay | Admitting: Physician Assistant

## 2013-09-28 ENCOUNTER — Encounter: Payer: Self-pay | Admitting: Physician Assistant

## 2013-09-28 ENCOUNTER — Ambulatory Visit (INDEPENDENT_AMBULATORY_CARE_PROVIDER_SITE_OTHER): Payer: Medicare Other | Admitting: Physician Assistant

## 2013-09-28 VITALS — BP 120/70 | HR 64 | Temp 97.7°F | Resp 18 | Wt 147.0 lb

## 2013-09-28 DIAGNOSIS — N401 Enlarged prostate with lower urinary tract symptoms: Secondary | ICD-10-CM

## 2013-09-28 DIAGNOSIS — I10 Essential (primary) hypertension: Secondary | ICD-10-CM

## 2013-09-28 DIAGNOSIS — E119 Type 2 diabetes mellitus without complications: Secondary | ICD-10-CM

## 2013-09-28 DIAGNOSIS — I359 Nonrheumatic aortic valve disorder, unspecified: Secondary | ICD-10-CM

## 2013-09-28 DIAGNOSIS — E785 Hyperlipidemia, unspecified: Secondary | ICD-10-CM

## 2013-09-28 DIAGNOSIS — R634 Abnormal weight loss: Secondary | ICD-10-CM

## 2013-09-28 DIAGNOSIS — R0989 Other specified symptoms and signs involving the circulatory and respiratory systems: Secondary | ICD-10-CM

## 2013-09-28 DIAGNOSIS — K5900 Constipation, unspecified: Secondary | ICD-10-CM

## 2013-09-28 DIAGNOSIS — R011 Cardiac murmur, unspecified: Secondary | ICD-10-CM

## 2013-09-28 DIAGNOSIS — N138 Other obstructive and reflux uropathy: Secondary | ICD-10-CM

## 2013-09-28 LAB — CBC WITH DIFFERENTIAL/PLATELET
BASOS PCT: 1 % (ref 0–1)
Basophils Absolute: 0.1 10*3/uL (ref 0.0–0.1)
Eosinophils Absolute: 0.2 10*3/uL (ref 0.0–0.7)
Eosinophils Relative: 3 % (ref 0–5)
HCT: 46.1 % (ref 39.0–52.0)
Hemoglobin: 15.7 g/dL (ref 13.0–17.0)
Lymphocytes Relative: 26 % (ref 12–46)
Lymphs Abs: 1.7 10*3/uL (ref 0.7–4.0)
MCH: 31.2 pg (ref 26.0–34.0)
MCHC: 34.1 g/dL (ref 30.0–36.0)
MCV: 91.7 fL (ref 78.0–100.0)
Monocytes Absolute: 0.5 10*3/uL (ref 0.1–1.0)
Monocytes Relative: 8 % (ref 3–12)
NEUTROS ABS: 4.1 10*3/uL (ref 1.7–7.7)
NEUTROS PCT: 62 % (ref 43–77)
PLATELETS: 151 10*3/uL (ref 150–400)
RBC: 5.03 MIL/uL (ref 4.22–5.81)
RDW: 14.3 % (ref 11.5–15.5)
WBC: 6.6 10*3/uL (ref 4.0–10.5)

## 2013-09-28 LAB — COMPLETE METABOLIC PANEL WITH GFR
ALK PHOS: 61 U/L (ref 39–117)
ALT: 24 U/L (ref 0–53)
AST: 20 U/L (ref 0–37)
Albumin: 4.3 g/dL (ref 3.5–5.2)
BILIRUBIN TOTAL: 1.3 mg/dL — AB (ref 0.2–1.2)
BUN: 19 mg/dL (ref 6–23)
CO2: 27 mEq/L (ref 19–32)
CREATININE: 0.89 mg/dL (ref 0.50–1.35)
Calcium: 10.1 mg/dL (ref 8.4–10.5)
Chloride: 99 mEq/L (ref 96–112)
GFR, EST NON AFRICAN AMERICAN: 84 mL/min
GFR, Est African American: >89 mL/min
GLUCOSE: 143 mg/dL — AB (ref 70–99)
Potassium: 4.5 mEq/L (ref 3.5–5.3)
Sodium: 138 mEq/L (ref 135–145)
Total Protein: 6.9 g/dL (ref 6.0–8.3)

## 2013-09-28 LAB — TSH: TSH: 2.827 u[IU]/mL (ref 0.350–4.500)

## 2013-09-28 LAB — LIPID PANEL
Cholesterol: 158 mg/dL (ref 0–200)
HDL: 38 mg/dL — AB (ref >39)
LDL Cholesterol: 86 mg/dL (ref 0–99)
TRIGLYCERIDES: 168 mg/dL — AB (ref <150)
Total CHOL/HDL Ratio: 4.2 Ratio
VLDL: 34 mg/dL (ref 0–40)

## 2013-09-28 LAB — HEMOGLOBIN A1C
HEMOGLOBIN A1C: 6.9 % — AB (ref <5.7)
Mean Plasma Glucose: 151 mg/dL — ABNORMAL HIGH (ref <117)

## 2013-09-28 MED ORDER — LINACLOTIDE 145 MCG PO CAPS: 145.0000 ug | ORAL_CAPSULE | Freq: Every day | ORAL | Status: DC

## 2013-09-28 NOTE — Progress Notes (Signed)
Patient ID: [name] MRN: [id number], DOB: September 21, 1940, 73 y.o. Date of Encounter: @DATE @  Chief Complaint:  Chief Complaint  Patient presents with  . 3 mth check up    HPI: 73 y.o. year old male  presents for regular office visit today.  I noticed that he looks even thinner today than usual. I then noticed that I had noted  at his last visit with me 06/29/13 that he had lost weight at the time of  that visit. I then noticed that at the time of that visit we had discussed the fact that he had lost some weight at that time. At that visit patient reported that he had decreased his dietary intake. He said that a lot of these dietary changes were secondary to finances. At that visit he stated "I just have to eat what I have.  I  can't afford anything more."   Weight February 2014 was 167. Weight 06/29/13 was 155. Weight today is 147.  Today patient reports that finances are not such a problem now. (At the time of his last office visit he was in the [facility] regarding paying for medications). He reports that he is able to afford to buy whatever food he needs now.  Says his dietary intake at this time has nothing to do with finances. Says that he is eating about the same amount as what he was eating about a year ago.  Wife is here with him for the visit. She agrees that he is eating pretty much a normal amount.  Patient states he has had some problems with constipation over the past 2 months.  Says that he has "always had problems with his bowels." Says in the past he can get back and forth between feeling constipated and having problems with diarrhea/loose stools. I asked what he is done to treat the constipation. He says that he use a Fleet suppository twice. He used an enema once. Used MiraLax once. This is all the treatment he has had. He has used nothing on a daily basis.  He is having no early satiety. No nausea or vomiting. No abdominal pain.  States that fasting blood  sugars are usually around 120.   Past Medical History  Diagnosis Date  . Hyperlipidemia   . Hypertension   . Vitamin D deficiency   . Diabetes mellitus   . GERD (gastroesophageal reflux disease)   . BPH (benign prostatic hypertrophy)   . History of cataract   . Constipation   . CAD (coronary artery disease)   . Aortic stenosis, mild   . Cancer     spindle cell cancer of left ear  . Nephrolithiasis      Home Meds: See attached medication section for current medication list. Any medications entered into computer today will not appear on this note's list. The medications listed below were entered prior to today. Current Outpatient Prescriptions on File Prior to Visit  Medication Sig Dispense Refill  . aspirin 81 MG tablet Take 81 mg by mouth every morning.       . benazepril-hydrochlorthiazide (LOTENSIN HCT) 10-12.5 MG per tablet Take 1 tablet by mouth every morning.      . Cholecalciferol (VITAMIN D) 2000 UNITS tablet Take 2,000 Units by mouth every morning.       . fish oil-omega-3 fatty acids 1000 MG capsule Take 1 g by mouth every morning. 1 tab po bid       . metFORMIN (GLUCOPHAGE) 500 MG  tablet TAKE 1 TABLET BY MOUTH EVERY MORNING  30 tablet  1  . metoprolol succinate (TOPROL-XL) 25 MG 24 hr tablet Take 25 mg by mouth every morning.      Marland Kitchen omeprazole (PRILOSEC) 20 MG capsule Take 20 mg by mouth every morning.       . pravastatin (PRAVACHOL) 40 MG tablet Take 40 mg by mouth every morning.       No current facility-administered medications on file prior to visit.    Allergies:  Allergies  Allergen Reactions  . Actos [Pioglitazone] Other (See Comments)    Cramps in legs   . Morphine     REACTION: hives  . Statins     REACTION: leg cramps but tolerates pravastatin    History   Social History  . Marital Status: Married    Spouse Name: N/A    Number of Children: N/A  . Years of Education: N/A   Occupational History  . Not on file.   Social History Main Topics    . Smoking status: Former Smoker -- 1.00 packs/day for 30 years  . Smokeless tobacco: Not on file     Comment: Quit >15 years ago  . Alcohol Use: No  . Drug Use: No  . Sexual Activity: Not on file   Other Topics Concern  . Not on file   Social History Narrative   Married for >44 years    Family History  Problem Relation Age of Onset  . Heart disease Mother   . Congestive Heart Failure Father      Review of Systems:  See HPI for pertinent ROS. All other ROS negative.    Physical Exam: Blood pressure 120/70, pulse 64, temperature 97.7 F (36.5 C), resp. rate 18, weight 147 lb (66.679 kg)., Body mass index is 22.36 kg/(m^2). General: WNWD WM. Appears in no acute distress. Neck: Supple. No thyromegaly. No lymphadenopathy. He does have carotid bruits loudest on the right. Lungs: Clear bilaterally to auscultation without wheezes, rales, or rhonchi. Breathing is unlabored. Heart: RRR with S1 S2. I/VI murmur Right 2nd ICS. II/VI murmur at apex.  Abdomen: Soft, non-tender, non-distended with normoactive bowel sounds. No hepatomegaly. No rebound/guarding. No obvious abdominal masses. I firmly palpated entire abdomen and he reports no area of tenderness. Musculoskeletal:  Strength and tone normal for age. Extremities/Skin: Warm and dry. No clubbing or cyanosis. No edema. No rashes or suspicious lesions. Neuro: Alert and oriented X 3. Moves all extremities spontaneously. Gait is normal. CNII-XII grossly in tact. Psych:  Responds to questions appropriately with a normal affect. Diabetic foot exam: Inspection is normal with no open wounds or sores. Sensation is intact and normal.  Right dorsalis pedis is 1+. Right posterior tibial pulse 2+.  Left Dorsalis pedis pulse 2+. Left posterior tibial pulse nonpalpable.      ASSESSMENT AND PLAN:  73 y.o. year old male with  1. Loss of weight - CBC with Differential - TSH - COMPLETE METABOLIC PANEL WITH GFR  2. CONSTIPATION - Linaclotide  (LINZESS) 145 MCG CAPS capsule; Take 1 capsule (145 mcg total) by mouth daily.  Dispense: 30 capsule; Refill: 3   I am concerned about his weight loss. Concerned about possible underlying malignancy as cause. Especially given the combination of weight loss plus constipation.  He had a screening colonoscopy 06/03/2009 and was to repeat 10-15 years. First, we will check labs to make sure that it is not just hyperthyroidism or other easily treatable cause. As well, he has used  no routine medication to treat constipation.   Told Him to start Linzess Q AM for the constipation. If this is too expensive and he is unable to buy this he needs to go back to using the MiraLax but to use 2 capsules every day. In 3 days if he has had no bowel movement with either taking Linzess or using 2 cap fulls of MiraLax daily then call me. I told him to increase his intake. Told him not to worry so much about his blood sugar and intake of carbs right now. However, just increasing his intake of foods like chicken and meats rather than sugars..  Schedule a followup office visit with me in the next one to 2 weeks to reassess his weight and constipation --and then determine any further evaluation treatment needed.   3. DIABETES MELLITUS, TYPE II - Hemoglobin A1c Last microalbumin: 12/2012  Office visit 06/29/13 he reported he had stopped Lantus because of finances and doughnut hole. At that time A1c was actually good and he's been able to remain off of Lantus. He provides excellent foot care. Reminded him of proper foot care and wearing shoes at all times. He is overdue for ophthalmology exam. He states that he cannot afford this. He has a high co-pay. He is on ACE inhibitor and statin therapy. In the past metformin was decreased secondary to diarrhea. He is able to tolerate current dose.  4. HYPERLIPIDEMIA - Lipid panel  5. HYPERTENSION Pressure well controlled. On ACE inhibitor. - COMPLETE METABOLIC PANEL WITH  GFR  6. AORTIC STENOSIS/ INSUFFICIENCY, NON-RHEUMATIC He sees cardiology and have echocardiogram and carotid artery Dopplers done routinely with them. 7. CARDIAC MURMUR 8. CAROTID BRUIT  9. BENIGN PROSTATIC HYPERTROPHY, WITH OBSTRUCTION A screening PSA was done 09/25/12 and was normal. He is now 73 years old so can stop doing screening PSA.  10. Screening colonoscopy: 06/03/2009. Repeat 10-15 years.  11. Immunizations: Pneumonia vaccine: Patient reports that he had this at age 52 his prior PCP                                 .--He would have received in Pneumovax 23.                     Given The new guidelines we will need to give him the Prevnar 13. --WILL DISCUSS THIS AT NEXT REGULAR OV Tetanus vaccine: Patient recently checked with his insurance and is going to cost him $40 and he deferred. Zostavax: Patient check with his insurance. Too expensive. The patient defers.  F/U OV 1-2 weeks.  Or sooner if any further concerns or changes. Also followup with me if he has no bowel movement within 3 days of using either Linzess or MiraLax as directed above.  Marin Olp [place], [place], [facility] 09/28/2013 12:28 PM

## 2013-10-09 ENCOUNTER — Telehealth: Payer: Self-pay | Admitting: Physician Assistant

## 2013-10-09 NOTE — Telephone Encounter (Signed)
Pt had to cancel his apt for Monday but wants to let you know that the medicine is working  Call back number is [phone]

## 2013-10-09 NOTE — Telephone Encounter (Signed)
I called him.  He said it was finances that he was not able to come.  I was able to reschedule him for later in the month. I am going to see if I can get him some help.

## 2013-10-12 ENCOUNTER — Ambulatory Visit: Payer: Medicare Other | Admitting: Physician Assistant

## 2013-10-12 NOTE — Telephone Encounter (Signed)
Spoke to patient. Relayed to him providers concern.  He says he will come to office tomorrow and let us weigh him.  I reinforced to him that will be no charge for that.

## 2013-10-12 NOTE — Telephone Encounter (Signed)
Call patient back and tell him that I was really concerned about the weight loss also. See if he can just come here and have [place] do a weight on the same set of scales that we have been weighing him on. Tell him that we would not enter this is an office visit so there would be no charge. We can just enter this information  In as telephone message--if he is continuing with weight loss, I can review his chart and information and make recommendations without doing an office visit and charging him anything. Have him come in today for a weight check.

## 2013-10-16 ENCOUNTER — Ambulatory Visit: Payer: Medicare Other | Admitting: Family Medicine

## 2013-10-16 ENCOUNTER — Telehealth: Payer: Self-pay | Admitting: Physician Assistant

## 2013-10-16 VITALS — Wt 145.0 lb

## 2013-10-16 DIAGNOSIS — R634 Abnormal weight loss: Secondary | ICD-10-CM

## 2013-10-16 NOTE — Progress Notes (Signed)
Patient ID: [name], male   DOB: Apr 07, 1941, 73 y.o.   MRN: [id number] Patient has come in for weight check per provider request.  Provider concerned over weight loss because patient states he can not afford food.  He has reached out to community food banks when able.  Also mentions other family issues at home.  I have reestablished him with [facility] nurse to monitor patient and get what ever assistance we can. I also gave him phone # to [facility] office for [facility] to see if he qualifies for low income subsidy.  If so, this will help with cost of his insurance and we can sign him up for medication assistance through the PPA.

## 2013-10-16 NOTE — Telephone Encounter (Signed)
I saw our staff note regarding patient coming in to the office today for weight check. Saw that today's weight is 145 pounds. Saw  notes regarding her getting them some assistance. I then called the patient and spoke to both the patient and his wife to try to get some more information. At his last visit he was complaining of some constipation. I recommended that he use MiraLax. He states that he had used that and it helped. Says that the MiraLax ran out last week. Asked how his stools have been since off of the MiraLax. He says that the constipation is not as bad as it was in the past. I then talked to both the patient and his wife regarding his food intake over the past year. Was trying to ascertain his weight loss over the past one year--to tell whether his weight loss simply secondary to decreased intake of food or whether I need to do a workup for some underlying medical cause. They report that he has been taking in decreased amount of food for quite some time and states that it IS because they could not afford any more food than what he has been taking in. They state that his decreased food intake was NOT because when he would eat he would have any abdominal pain nausea or vomiting or early satiety. Therefore I do not think that further medical workup is necessary at this time. Simply proceed with giving him adequate assistance. At [place] I had already recommended that he add Ensure.   Note that these are the weights which I have reviewed: 2 / 2014--167 lb 11 / 2014---155 lb 09/28/13---147 lb Today--- 145 lb

## 2013-10-24 ENCOUNTER — Other Ambulatory Visit: Payer: Self-pay | Admitting: Physician Assistant

## 2013-10-26 NOTE — Telephone Encounter (Signed)
Medication refilled per protocol. 

## 2013-11-02 ENCOUNTER — Ambulatory Visit (INDEPENDENT_AMBULATORY_CARE_PROVIDER_SITE_OTHER): Payer: Medicare Other | Admitting: Physician Assistant

## 2013-11-02 ENCOUNTER — Encounter: Payer: Self-pay | Admitting: Physician Assistant

## 2013-11-02 VITALS — BP 110/76 | HR 68 | Temp 98.1°F | Resp 18 | Wt 147.0 lb

## 2013-11-02 DIAGNOSIS — K5900 Constipation, unspecified: Secondary | ICD-10-CM

## 2013-11-02 DIAGNOSIS — R634 Abnormal weight loss: Secondary | ICD-10-CM

## 2013-11-03 NOTE — Progress Notes (Signed)
Patient ID: KAYSHAUN POLANCO MRN: [id number], DOB: 1941/03/10, 73 y.o. Date of Encounter: @DATE @  Chief Complaint:  Chief Complaint  Patient presents with  . follow up    HPI: 73 y.o. year old white male  presents today to followup his recent weight loss and change in bowel habits.  His last office visit with me was 09/28/13. At that time he presented for his routine 3 month checkup for diabetes hypertension hyperlipidemia et Ronney Asters.  However, at that visit I noticed that he looked even better that day than usual. I then noticed that I had noted at his prior visit with me 06/29/13 that he had also lost weight at the time of that visit. At that visit , I initially assumed that his weight loss was intentional  secondary to dietary changes to help control his diabetes.  However, at that visit 06/29/13 he reported that he decrease his dietary intake secondary to finances. At that visit he stated   "I just have to eat what I have.   I can't afford anything more."  Weight February 2014   was 167. Weight 06/29/13             was 155. Weight 09/28/13               was 147. Today's weight is again 147.  At his visit with me 75/23/15 he said that finances were not quite such a problem at that time. He stated that at the time of his prior visit in November he was in the Donut hole regarding paying for medications.  Also at his visit 09/28/13 he reported that he was having some problems with constipation over the prior 2 months. At that visit he reported that he " always had some problems with his bowels."   He stated that in the past he would go back and forth between feeling constipated having problems with diarrhea/loose stools. I do know that in the past we had to decrease his metformin down to his current dose of 500 mg daily because any higher dose caused loose stools/diarrhea.  At the visit 09/28/13 I did labs to evaluate underlying medical causes of weight loss including CBC TSH and CME T.  These were all normal.  We also discussed the fact he had a screening colonoscopy 06/03/2009 and he was told to repeat 10-15 years.  At that visit I gave him prescription and sample for Linzess.  Told him that if that was too expensive then he can get over-the-counter MiraLax and to start with 2 cap fulls daily . If He had no bowel movement with this after 3 days, then he was to call me for further dosing directions. I told him to increase his food intake and not worry about carbohydrates etc. regarding his sugar but simply just try to eat more food in general.  Was to schedule followup office visit with me in the next 1 to 2 weeks to reassess his weight and constipation. However, when we talked to him on the phone he stated he could not afford to pay for an office visit. We then recommended that he come in for a weight check free of charge.  Those encounters and recommendations are documented in the computer. See those for details.  He did come in for weight check and his weight was stable at that time. Say when he came in for that we checked our staff gave him some contact information --he could call for some  assistance.  Today he says that he is using [employer] Says that they get him through to every other Thursday. He gives very pick this up. Says that he has other phone numbers that he could call for food if he needs to but hasn't needed to so far. Today he also says that he did get another bottle of MiraLax. Says that with using this comes out is  "squirts"  says it is very small amounts.  If not feel like he is getting out the amount of stool that he should be.  Still having no abdominal pain. No early satiety. No melena or hematochezia. No Fevers or chills.      Past Medical History  Diagnosis Date  . Hyperlipidemia   . Hypertension   . Vitamin D deficiency   . Diabetes mellitus   . GERD (gastroesophageal reflux disease)   . BPH (benign prostatic hypertrophy)   .  History of cataract   . Constipation   . CAD (coronary artery disease)   . Aortic stenosis, mild   . Cancer     spindle cell cancer of left ear  . Nephrolithiasis      Home Meds: See attached medication section for current medication list. Any medications entered into computer today will not appear on this note's list. The medications listed below were entered prior to today. Current Outpatient Prescriptions on File Prior to Visit  Medication Sig Dispense Refill  . aspirin 81 MG tablet Take 81 mg by mouth every morning.       . benazepril-hydrochlorthiazide (LOTENSIN HCT) 10-12.5 MG per tablet Take 1 tablet by mouth every morning.      . Cholecalciferol (VITAMIN D) 2000 UNITS tablet Take 2,000 Units by mouth every morning.       . fish oil-omega-3 fatty acids 1000 MG capsule Take 1 g by mouth every morning. 1 tab po bid       . metFORMIN (GLUCOPHAGE) 500 MG tablet TAKE 1 TABLET BY MOUTH EVERY MORNING  30 tablet  1  . metoprolol succinate (TOPROL-XL) 25 MG 24 hr tablet Take 25 mg by mouth every morning.      . pravastatin (PRAVACHOL) 40 MG tablet Take 40 mg by mouth every morning.      . Linaclotide (LINZESS) 145 MCG CAPS capsule Take 1 capsule (145 mcg total) by mouth daily.  30 capsule  3  . omeprazole (PRILOSEC) 20 MG capsule Take 20 mg by mouth every morning.        No current facility-administered medications on file prior to visit.    Allergies:  Allergies  Allergen Reactions  . Actos [Pioglitazone] Other (See Comments)    Cramps in legs   . Morphine     REACTION: hives  . Statins     REACTION: leg cramps but tolerates pravastatin    History   Social History  . Marital Status: Married    Spouse Name: N/A    Number of Children: N/A  . Years of Education: N/A   Occupational History  . Not on file.   Social History Main Topics  . Smoking status: Former Smoker -- 1.00 packs/day for 30 years  . Smokeless tobacco: Not on file     Comment: Quit >15 years ago  .  Alcohol Use: No  . Drug Use: No  . Sexual Activity: Not on file   Other Topics Concern  . Not on file   Social History Narrative   Married for >44 years  Family History  Problem Relation Age of Onset  . Heart disease Mother   . Congestive Heart Failure Father      Review of Systems:  See HPI for pertinent ROS. All other ROS negative.    Physical Exam: Blood pressure 110/76, pulse 68, temperature 98.1 F (36.7 C), temperature source Oral, resp. rate 18, weight 147 lb (66.679 kg)., Body mass index is 22.36 kg/(m^2). General: Thin WM. Appears in no acute distress. Lungs: Clear bilaterally to auscultation without wheezes, rales, or rhonchi. Breathing is unlabored. Heart: RRR with S1 S2. No murmurs, rubs, or gallops. Abdomen: Soft, non-tender, non-distended with normoactive bowel sounds. No hepatomegaly. No rebound/guarding. No obvious abdominal masses. Firmly palpated abdomen multiple times and he reports no areas of tenderness. Musculoskeletal:  Strength and tone normal for age. Extremities/Skin: Warm and dry. No clubbing or cyanosis. No edema. No rashes or suspicious lesions. Neuro: Alert and oriented X 3. Moves all extremities spontaneously. Gait is normal. CNII-XII grossly in tact. Psych:  Responds to questions appropriately with a normal affect.     ASSESSMENT AND PLAN:  73 y.o. year old male with  1. CONSTIPATION - Ambulatory referral to Gastroenterology  2. Loss of weight - Ambulatory referral to Gastroenterology  Recommend he follow up with GI for further evaluation. He is agreeable to do so. I will talk to office manager about trying to make it so that he can pay no co-pay for his visits here. Will try to find if there are any other assistance programs that can help him and his wife. Have reviewed his medications but they are all generics and he is on no medications I can provide samples of.  Plan next office visit here for his routine usual 3 month followup for  diabetes etc.  Signed, [address] [place], [place], [facility] 11/03/2013 8:55 AM

## 2013-11-04 ENCOUNTER — Encounter (INDEPENDENT_AMBULATORY_CARE_PROVIDER_SITE_OTHER): Payer: Self-pay | Admitting: *Deleted

## 2013-11-26 ENCOUNTER — Telehealth (INDEPENDENT_AMBULATORY_CARE_PROVIDER_SITE_OTHER): Payer: Self-pay | Admitting: *Deleted

## 2013-11-26 ENCOUNTER — Ambulatory Visit (INDEPENDENT_AMBULATORY_CARE_PROVIDER_SITE_OTHER): Payer: Medicare Other | Admitting: Internal Medicine

## 2013-11-26 ENCOUNTER — Encounter (INDEPENDENT_AMBULATORY_CARE_PROVIDER_SITE_OTHER): Payer: Self-pay | Admitting: Internal Medicine

## 2013-11-26 ENCOUNTER — Other Ambulatory Visit (INDEPENDENT_AMBULATORY_CARE_PROVIDER_SITE_OTHER): Payer: Self-pay | Admitting: *Deleted

## 2013-11-26 VITALS — BP 94/50 | HR 60 | Temp 97.7°F | Ht 68.0 in | Wt 148.2 lb

## 2013-11-26 DIAGNOSIS — Z1211 Encounter for screening for malignant neoplasm of colon: Secondary | ICD-10-CM

## 2013-11-26 DIAGNOSIS — Z860100 Personal history of colon polyps, unspecified: Secondary | ICD-10-CM

## 2013-11-26 DIAGNOSIS — R634 Abnormal weight loss: Secondary | ICD-10-CM

## 2013-11-26 MED ORDER — PEG 3350-KCL-NA BICARB-NACL 420 G PO SOLR: 4000.0000 mL | Freq: Once | ORAL | Status: DC

## 2013-11-26 NOTE — Patient Instructions (Signed)
Colonoscopy.  The risks and benefits such as perforation, bleeding, and infection were reviewed with the patient and is agreeable. 

## 2013-11-26 NOTE — Telephone Encounter (Signed)
Patient needs trilyte 
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
## 2013-12-04 ENCOUNTER — Encounter (HOSPITAL_COMMUNITY): Payer: Self-pay | Admitting: *Deleted

## 2013-12-04 ENCOUNTER — Ambulatory Visit (HOSPITAL_COMMUNITY)
Admission: RE | Admit: 2013-12-04 | Discharge: 2013-12-04 | Disposition: A | Discharge status: Home / Self Care | Payer: Medicare Other | Source: Ambulatory Visit | Attending: Internal Medicine | Admitting: Internal Medicine

## 2013-12-04 ENCOUNTER — Encounter (HOSPITAL_COMMUNITY)
Admission: RE | Disposition: A | Discharge status: Home / Self Care | Payer: Self-pay | Source: Ambulatory Visit | Attending: Internal Medicine

## 2013-12-04 ENCOUNTER — Other Ambulatory Visit (INDEPENDENT_AMBULATORY_CARE_PROVIDER_SITE_OTHER): Payer: Self-pay | Admitting: Internal Medicine

## 2013-12-04 DIAGNOSIS — K644 Residual hemorrhoidal skin tags: Secondary | ICD-10-CM | POA: Insufficient documentation

## 2013-12-04 DIAGNOSIS — E119 Type 2 diabetes mellitus without complications: Secondary | ICD-10-CM | POA: Insufficient documentation

## 2013-12-04 DIAGNOSIS — I2510 Atherosclerotic heart disease of native coronary artery without angina pectoris: Secondary | ICD-10-CM | POA: Insufficient documentation

## 2013-12-04 DIAGNOSIS — E785 Hyperlipidemia, unspecified: Secondary | ICD-10-CM | POA: Insufficient documentation

## 2013-12-04 DIAGNOSIS — K219 Gastro-esophageal reflux disease without esophagitis: Secondary | ICD-10-CM | POA: Insufficient documentation

## 2013-12-04 DIAGNOSIS — Z7982 Long term (current) use of aspirin: Secondary | ICD-10-CM | POA: Insufficient documentation

## 2013-12-04 DIAGNOSIS — Z860100 Personal history of colon polyps, unspecified: Secondary | ICD-10-CM | POA: Insufficient documentation

## 2013-12-04 DIAGNOSIS — Z79899 Other long term (current) drug therapy: Secondary | ICD-10-CM | POA: Insufficient documentation

## 2013-12-04 DIAGNOSIS — Z87891 Personal history of nicotine dependence: Secondary | ICD-10-CM | POA: Insufficient documentation

## 2013-12-04 DIAGNOSIS — K648 Other hemorrhoids: Secondary | ICD-10-CM | POA: Insufficient documentation

## 2013-12-04 DIAGNOSIS — E559 Vitamin D deficiency, unspecified: Secondary | ICD-10-CM | POA: Insufficient documentation

## 2013-12-04 DIAGNOSIS — N400 Enlarged prostate without lower urinary tract symptoms: Secondary | ICD-10-CM | POA: Insufficient documentation

## 2013-12-04 DIAGNOSIS — Z1211 Encounter for screening for malignant neoplasm of colon: Secondary | ICD-10-CM | POA: Insufficient documentation

## 2013-12-04 DIAGNOSIS — I10 Essential (primary) hypertension: Secondary | ICD-10-CM | POA: Insufficient documentation

## 2013-12-04 DIAGNOSIS — Z888 Allergy status to other drugs, medicaments and biological substances status: Secondary | ICD-10-CM | POA: Insufficient documentation

## 2013-12-04 DIAGNOSIS — R634 Abnormal weight loss: Secondary | ICD-10-CM

## 2013-12-04 DIAGNOSIS — Z885 Allergy status to narcotic agent status: Secondary | ICD-10-CM | POA: Insufficient documentation

## 2013-12-04 DIAGNOSIS — R109 Unspecified abdominal pain: Secondary | ICD-10-CM

## 2013-12-04 DIAGNOSIS — D126 Benign neoplasm of colon, unspecified: Secondary | ICD-10-CM | POA: Insufficient documentation

## 2013-12-04 HISTORY — PX: COLONOSCOPY: SHX5424

## 2013-12-04 LAB — CREATININE, SERUM
CREATININE: 0.87 mg/dL (ref 0.50–1.35)
GFR calc Af Amer: >90 mL/min (ref >90)
GFR, EST NON AFRICAN AMERICAN: 83 mL/min — AB (ref >90)

## 2013-12-04 LAB — GLUCOSE, CAPILLARY: GLUCOSE-CAPILLARY: 125 mg/dL — AB (ref 70–99)

## 2013-12-04 SURGERY — COLONOSCOPY
Anesthesia: Moderate Sedation

## 2013-12-04 MED ORDER — MIDAZOLAM HCL 5 MG/5ML IJ SOLN
INTRAMUSCULAR | Status: AC
  Filled 2013-12-04: qty 10

## 2013-12-04 MED ORDER — SIMETHICONE 40 MG/0.6ML PO SUSP
ORAL | Status: DC | PRN
  Administered 2013-12-04: 10:00:00

## 2013-12-04 MED ORDER — MIDAZOLAM HCL 5 MG/5ML IJ SOLN
INTRAMUSCULAR | Status: DC | PRN
  Administered 2013-12-04: 2 mg via INTRAVENOUS
  Administered 2013-12-04: 1 mg via INTRAVENOUS

## 2013-12-04 MED ORDER — MEPERIDINE HCL 50 MG/ML IJ SOLN
INTRAMUSCULAR | Status: DC
Start: 2013-12-04 — End: 2013-12-04
  Filled 2013-12-04: qty 1

## 2013-12-04 MED ORDER — SODIUM CHLORIDE 0.9 % IV SOLN
INTRAVENOUS | Status: DC
  Administered 2013-12-04: 09:00:00 via INTRAVENOUS

## 2013-12-04 MED ORDER — MEPERIDINE HCL 50 MG/ML IJ SOLN
INTRAMUSCULAR | Status: DC | PRN
  Administered 2013-12-04: 25 mg via INTRAVENOUS

## 2013-12-04 NOTE — Discharge Instructions (Signed)
Resume usual medications except aspirin which can be resumed next week Friday. Resume usual diet. No driving for 24 hours. Chest and abdominopelvic CT to be scheduled. Office will call. Physician will call with results of biopsy.  Colonoscopy, Care After Refer to this sheet in the next few weeks. These instructions provide you with information on caring for yourself after your procedure. Your health care provider may also give you more specific instructions. Your treatment has been planned according to current medical practices, but problems sometimes occur. Call your health care provider if you have any problems or questions after your procedure. WHAT TO EXPECT AFTER THE PROCEDURE  After your procedure, it is typical to have the following:  A small amount of blood in your stool.  Moderate amounts of gas and mild abdominal cramping or bloating. HOME CARE INSTRUCTIONS  Do not drive, operate machinery, or sign important documents for 24 hours.  You may shower and resume your regular physical activities, but move at a slower pace for the first 24 hours.  Take frequent rest periods for the first 24 hours.  Walk around or put a warm pack on your abdomen to help reduce abdominal cramping and bloating.  Drink enough fluids to keep your urine clear or pale yellow.  You may resume your normal diet as instructed by your health care provider. Avoid heavy or fried foods that are hard to digest.  Avoid drinking alcohol for 24 hours or as instructed by your health care provider.  Only take over-the-counter or prescription medicines as directed by your health care provider.  If a tissue sample (biopsy) was taken during your procedure:  Do not take aspirin or blood thinners for 7 days, or as instructed by your health care provider.  Do not drink alcohol for 7 days, or as instructed by your health care provider.  Eat soft foods for the first 24 hours. SEEK MEDICAL CARE IF: You have persistent  spotting of blood in your stool 2 3 days after the procedure. SEEK IMMEDIATE MEDICAL CARE IF:  You have more than a small spotting of blood in your stool.  You pass large blood clots in your stool.  Your abdomen is swollen (distended).  You have nausea or vomiting.  You have a fever.  You have increasing abdominal pain that is not relieved with medicine. Document Released: 03/06/2004 Document Revised: 05/13/2013 Document Reviewed: 03/30/2013 [facility] Patient Information 2014 [place].

## 2013-12-04 NOTE — Op Note (Addendum)
COLONOSCOPY PROCEDURE REPORT  PATIENT:  [name]  MR#:  [id number] Birthdate:  January 09, 1941, 73 y.o., male Endoscopist:  Dr. [name], MD Referred By:  Ms. Karis Juba, [facility]  Procedure Date: 12/04/2013  Procedure:   Colonoscopy  Indications: Patient is 73 year old Caucasian male who has history of colonic adenomas and is here for surveillance colonoscopy. Gives history of intermittent hematochezia and is constipated. He also has significant weight loss over the last 3 months. Routine blood work has been unremarkable except hemoglobin A1c was 6.9.  Informed Consent:  The procedure and risks were reviewed with the patient and informed consent was obtained.  Medications:  Demerol 25 mg IV Versed 3 mg IV  Description of procedure:  After a digital rectal exam was performed, that colonoscope was advanced from the anus through the rectum and colon to the area of the cecum, ileocecal valve and appendiceal orifice. The cecum was deeply intubated. These structures were well-seen and photographed for the record. From the level of the cecum and ileocecal valve, the scope was slowly and cautiously withdrawn. The mucosal surfaces were carefully surveyed utilizing scope tip to flexion to facilitate fold flattening as needed. The scope was pulled down into the rectum where a thorough exam including retroflexion was performed.  Findings:   Prep satisfactory. Small polyp ablated via cold biopsy from hepatic flexure. 12 x 4 mm tubular sessile polyp at mid transverse colon. Approach to this polyp was difficult. It was biopsied for histology and rest of the polyp was ablated with argon plasma coagulator. Mucosa results of the colon and rectum was normal. Hemorrhoids noted above and below the dentate line.   Therapeutic/Diagnostic Maneuvers Performed:  See above  Complications:  None  Cecal Withdrawal Time:  20 minutes  Impression:  Examination performed to cecum. Small polyp ablated via  cold biopsy from the hepatic flexure. 12 x 4 mm sessile worm like polyp noted at mid transverse colon. Biopsy taken for histology and rest of the polyp was ablated with argon plasma coagulator. External and internal hemorrhoids.  Recommendations:  Standard instructions given. I will contact patient with biopsy results. Will arrange for chest and abdominopelvic CT to further evaluate his unexplained weight loss and right lower quadrant abdominal pain.  [name]  12/04/2013 10:05 AM  CC: Dr. Karis Juba, PA-C & Dr. Rayne Du ref. provider found
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
## 2013-12-08 ENCOUNTER — Encounter (HOSPITAL_COMMUNITY): Payer: Self-pay | Admitting: Internal Medicine

## 2013-12-09 ENCOUNTER — Ambulatory Visit (HOSPITAL_COMMUNITY)
Admission: RE | Admit: 2013-12-09 | Discharge: 2013-12-09 | Disposition: A | Discharge status: Home / Self Care | Payer: Medicare Other | Source: Ambulatory Visit | Attending: Internal Medicine | Admitting: Internal Medicine

## 2013-12-09 ENCOUNTER — Encounter (HOSPITAL_COMMUNITY): Payer: Self-pay

## 2013-12-09 DIAGNOSIS — R634 Abnormal weight loss: Secondary | ICD-10-CM

## 2013-12-09 DIAGNOSIS — R109 Unspecified abdominal pain: Secondary | ICD-10-CM

## 2013-12-09 MED ORDER — IOHEXOL 300 MG/ML  SOLN
100.0000 mL | Freq: Once | INTRAMUSCULAR | Status: AC | PRN
Start: 2013-12-09 — End: 2013-12-09
  Administered 2013-12-09: 100 mL via INTRAVENOUS

## 2013-12-14 ENCOUNTER — Other Ambulatory Visit: Payer: Self-pay | Admitting: Adult Health

## 2014-01-01 ENCOUNTER — Telehealth (INDEPENDENT_AMBULATORY_CARE_PROVIDER_SITE_OTHER): Payer: Self-pay | Admitting: *Deleted

## 2014-01-01 NOTE — Telephone Encounter (Signed)
Weight check 143.1 lbs.

## 2014-01-04 NOTE — Telephone Encounter (Signed)
To Dr.Rehman

## 2014-01-04 NOTE — Telephone Encounter (Signed)
Forwarded to Dr.Rehman as FYI. 

## 2014-01-24 NOTE — Telephone Encounter (Signed)
Let us check his weight after July 4

## 2014-01-25 NOTE — Telephone Encounter (Signed)
Attempted to call patient again. His voice mailbox has not been set up. Patient will be called back later.

## 2014-01-25 NOTE — Telephone Encounter (Signed)
Attempted to call patient and the line was busy.

## 2014-01-26 ENCOUNTER — Other Ambulatory Visit: Payer: Self-pay | Admitting: Physician Assistant

## 2014-01-26 ENCOUNTER — Encounter: Payer: Self-pay | Admitting: Family Medicine

## 2014-01-26 NOTE — Telephone Encounter (Signed)
Medication refill for one time only.  Patient needs to be seen.  Letter sent for patient to call and schedule 

## 2014-01-27 NOTE — Telephone Encounter (Signed)
Patient was called and ask to come in after the 4 th of July for a weight check.

## 2014-02-08 ENCOUNTER — Telehealth (INDEPENDENT_AMBULATORY_CARE_PROVIDER_SITE_OTHER): Payer: Self-pay | Admitting: *Deleted

## 2014-02-08 NOTE — Telephone Encounter (Signed)
Weight is 138.4 lbs

## 2014-02-09 NOTE — Telephone Encounter (Signed)
Patient presented to the office for a repeat weight check 02/08/14. His weight was 138 lbs 4 oz. Patient was weighed on 01/01/14 , weighed 143.1 lbs.  Forwarded to [place] for review.

## 2014-02-18 NOTE — Telephone Encounter (Signed)
Talked with patient; he continues to lose weight slowly. He states he used to weigh about 160 pounds before he starts to lose weight. He states he has good appetite. He has intermittent diarrhea but not daily or bad enough. Will make an appointment for office visit in one month. Butch Penny, please make an appointment for office visit on August 27 or 28, 2015.

## 2014-02-19 NOTE — Telephone Encounter (Signed)
Apt has been scheduled for 04/01/14 at 3:30 with Dr. Laural Golden.

## 2014-02-22 ENCOUNTER — Encounter: Payer: Self-pay | Admitting: Physician Assistant

## 2014-02-22 ENCOUNTER — Ambulatory Visit (INDEPENDENT_AMBULATORY_CARE_PROVIDER_SITE_OTHER): Payer: Medicare Other | Admitting: Physician Assistant

## 2014-02-22 VITALS — BP 106/62 | HR 68 | Temp 98.1°F | Resp 18 | Wt 143.0 lb

## 2014-02-22 DIAGNOSIS — Z860100 Personal history of colon polyps, unspecified: Secondary | ICD-10-CM

## 2014-02-22 DIAGNOSIS — N138 Other obstructive and reflux uropathy: Secondary | ICD-10-CM

## 2014-02-22 DIAGNOSIS — N401 Enlarged prostate with lower urinary tract symptoms: Secondary | ICD-10-CM

## 2014-02-22 DIAGNOSIS — R0989 Other specified symptoms and signs involving the circulatory and respiratory systems: Secondary | ICD-10-CM

## 2014-02-22 DIAGNOSIS — E785 Hyperlipidemia, unspecified: Secondary | ICD-10-CM

## 2014-02-22 DIAGNOSIS — R011 Cardiac murmur, unspecified: Secondary | ICD-10-CM

## 2014-02-22 DIAGNOSIS — E119 Type 2 diabetes mellitus without complications: Secondary | ICD-10-CM

## 2014-02-22 DIAGNOSIS — I359 Nonrheumatic aortic valve disorder, unspecified: Secondary | ICD-10-CM

## 2014-02-22 DIAGNOSIS — R634 Abnormal weight loss: Secondary | ICD-10-CM

## 2014-02-22 DIAGNOSIS — I10 Essential (primary) hypertension: Secondary | ICD-10-CM

## 2014-02-22 DIAGNOSIS — K5900 Constipation, unspecified: Secondary | ICD-10-CM

## 2014-02-22 LAB — LIPID PANEL
Cholesterol: 151 mg/dL (ref 0–200)
HDL: 38 mg/dL — ABNORMAL LOW (ref >39)
LDL Cholesterol: 78 mg/dL (ref 0–99)
TRIGLYCERIDES: 176 mg/dL — AB (ref <150)
Total CHOL/HDL Ratio: 4 Ratio
VLDL: 35 mg/dL (ref 0–40)

## 2014-02-22 LAB — CBC WITH DIFFERENTIAL/PLATELET
BASOS ABS: 0.1 10*3/uL (ref 0.0–0.1)
Basophils Relative: 1 % (ref 0–1)
EOS ABS: 0.1 10*3/uL (ref 0.0–0.7)
EOS PCT: 2 % (ref 0–5)
HCT: 44.1 % (ref 39.0–52.0)
Hemoglobin: 14.7 g/dL (ref 13.0–17.0)
Lymphocytes Relative: 29 % (ref 12–46)
Lymphs Abs: 1.9 10*3/uL (ref 0.7–4.0)
MCH: 30.9 pg (ref 26.0–34.0)
MCHC: 33.3 g/dL (ref 30.0–36.0)
MCV: 92.8 fL (ref 78.0–100.0)
Monocytes Absolute: 0.6 10*3/uL (ref 0.1–1.0)
Monocytes Relative: 9 % (ref 3–12)
Neutro Abs: 3.8 10*3/uL (ref 1.7–7.7)
Neutrophils Relative %: 59 % (ref 43–77)
PLATELETS: 125 10*3/uL — AB (ref 150–400)
RBC: 4.75 MIL/uL (ref 4.22–5.81)
RDW: 14.1 % (ref 11.5–15.5)
WBC: 6.4 10*3/uL (ref 4.0–10.5)

## 2014-02-22 LAB — COMPLETE METABOLIC PANEL WITH GFR
ALT: 22 U/L (ref 0–53)
AST: 22 U/L (ref 0–37)
Albumin: 4.1 g/dL (ref 3.5–5.2)
Alkaline Phosphatase: 62 U/L (ref 39–117)
BUN: 16 mg/dL (ref 6–23)
CO2: 29 mEq/L (ref 19–32)
CREATININE: 0.82 mg/dL (ref 0.50–1.35)
Calcium: 9.4 mg/dL (ref 8.4–10.5)
Chloride: 99 mEq/L (ref 96–112)
GFR, Est Non African American: 87 mL/min
Glucose, Bld: 125 mg/dL — ABNORMAL HIGH (ref 70–99)
Potassium: 4.4 mEq/L (ref 3.5–5.3)
Sodium: 138 mEq/L (ref 135–145)
Total Bilirubin: 0.9 mg/dL (ref 0.2–1.2)
Total Protein: 6.5 g/dL (ref 6.0–8.3)

## 2014-02-22 LAB — HEMOGLOBIN A1C
Hgb A1c MFr Bld: 6.4 % — ABNORMAL HIGH (ref <5.7)
Mean Plasma Glucose: 137 mg/dL — ABNORMAL HIGH (ref <117)

## 2014-02-22 LAB — TSH: TSH: 3.998 u[IU]/mL (ref 0.350–4.500)

## 2014-02-23 ENCOUNTER — Other Ambulatory Visit: Payer: Self-pay | Admitting: Physician Assistant

## 2014-02-23 LAB — MICROALBUMIN, URINE: Microalb, Ur: 1.24 mg/dL (ref 0.00–1.89)

## 2014-02-23 NOTE — Progress Notes (Signed)
Patient ID: TORRI LANGSTON MRN: [id number], DOB: [birth year], 73 y.o. Date of Encounter: @DATE @  Chief Complaint:  Chief Complaint  Patient presents with  . 3 mth check up    is fasting, meds inconsistant due to running out    HPI: 73 y.o. year old white male  presents today to followup his recent weight loss and change in bowel habits.  His last office visit with me was 09/28/13. At that time he presented for his routine 3 month checkup for diabetes hypertension hyperlipidemia et Ronney Asters.  However, at that visit I noticed that he looked even better that day than usual. I then noticed that I had noted at his prior visit with me 06/29/13 that he had also lost weight at the time of that visit. At that visit , I initially assumed that his weight loss was intentional  secondary to dietary changes to help control his diabetes.  However, at that visit 06/29/13 he reported that he decrease his dietary intake secondary to finances. At that visit he stated   "I just have to eat what I have.   I can't afford anything more."  Weight February 2014   was 167. Weight 06/29/13             was 155. Weight 09/28/13               was 147. Today's weight is again 147.  At his visit with me 09/28/13 he said that finances were not quite such a problem at that time. He stated that at the time of his prior visit in November he was in the Donut hole regarding paying for medications.  Also at his visit 09/28/13 he reported that he was having some problems with constipation over the prior 2 months. At that visit he reported that he " always had some problems with his bowels."   He stated that in the past he would go back and forth between feeling constipated having problems with diarrhea/loose stools. I do know that in the past we had to decrease his metformin down to his current dose of 500 mg daily because any higher dose caused loose stools/diarrhea.  At the visit 09/28/13 I did labs to evaluate underlying  medical causes of weight loss including CBC TSH and CME T. These were all normal.  We also discussed the fact he had a screening colonoscopy 06/03/2009 and he was told to repeat 10-15 years.  At that visit I gave him prescription and sample for Linzess.  Told him that if that was too expensive then he can get over-the-counter MiraLax and to start with 2 cap fulls daily . If He had no bowel movement with this after 3 days, then he was to call me for further dosing directions. I told him to increase his food intake and not worry about carbohydrates etc. regarding his sugar but simply just try to eat more food in general.  Was to schedule followup office visit with me in the next 1 to 2 weeks to reassess his weight and constipation. However, when we talked to him on the phone he stated he could not afford to pay for an office visit. We then recommended that he come in for a weight check free of charge.  Those encounters and recommendations are documented in the computer. See those for details.  He did come in for weight check and his weight was stable at that time. Say when he came in for that we  checked our staff gave him some contact information --he could call for some assistance.  At [place] 09/28/13 he says that he is using [employer] Says that they get him through to every other Thursday. He gives very pick this up. Says that he has other phone numbers that he could call for food if he needs to but hasn't needed to so far.  He had colonoscopy by Dr. Jearld Adjutant on 12/12/13. There were polyps which were biopsied. External and internal hemorrhoids. At that time Dr. Jearld Adjutant planned to order CT of abdomen and pelvis as well as chest.  He had a CT scan on 12/09/13. No malignancy was seen on either CT scan. No significant finding seen on the CT scans.  Today he says that he gets food once a month from 4 or 5 different places--- [employer] out reach, [employer], [employer], their church.  TODAY  he brought in BS log sheet. He checks fasting QAM. All readings are extremely consistent between 94-123. He does not check blood sugar at any other times of day.  Past Medical History  Diagnosis Date  . Hyperlipidemia   . Hypertension   . Vitamin D deficiency   . Diabetes mellitus   . GERD (gastroesophageal reflux disease)   . BPH (benign prostatic hypertrophy)   . History of cataract   . Constipation   . CAD (coronary artery disease)   . Aortic stenosis, mild   . Cancer     spindle cell cancer of left ear  . Nephrolithiasis      Home Meds: Outpatient Prescriptions Prior to Visit  Medication Sig Dispense Refill  . benazepril-hydrochlorthiazide (LOTENSIN HCT) 10-12.5 MG per tablet TAKE 1 TABLET BY MOUTH DAILY.  90 tablet  3  . Cholecalciferol (VITAMIN D) 2000 UNITS tablet Take 2,000 Units by mouth every morning.       . fish oil-omega-3 fatty acids 1000 MG capsule Take 1 g by mouth every morning. 1 tab po bid       . metFORMIN (GLUCOPHAGE) 500 MG tablet TAKE 1 TABLET BY MOUTH EVERY MORNING  30 tablet  0  . metoprolol succinate (TOPROL-XL) 25 MG 24 hr tablet TAKE 1 TABLET (25 MG TOTAL) BY MOUTH DAILY (INSURANCE?)  90 tablet  3  . pravastatin (PRAVACHOL) 40 MG tablet TAKE 1 TABLET (40 MG TOTAL) BY MOUTH DAILY.  90 tablet  3  . omeprazole (PRILOSEC) 20 MG capsule Take 20 mg by mouth every morning.       . benazepril-hydrochlorthiazide (LOTENSIN HCT) 10-12.5 MG per tablet Take 1 tablet by mouth every morning.      . metoprolol succinate (TOPROL-XL) 25 MG 24 hr tablet Take 25 mg by mouth every morning.      . polyethylene glycol-electrolytes (NULYTELY/GOLYTELY) 420 G solution Take 4,000 mLs by mouth once.  4000 mL  0  . pravastatin (PRAVACHOL) 40 MG tablet Take 40 mg by mouth every morning.       No facility-administered medications prior to visit.     Allergies:  Allergies  Allergen Reactions  . Actos [Pioglitazone] Other (See Comments)    Cramps in legs   . Morphine      REACTION: hives  . Statins     REACTION: leg cramps but tolerates pravastatin    History   Social History  . Marital Status: Married    Spouse Name: N/A    Number of Children: N/A  . Years of Education: N/A   Occupational History  .  Not on file.   Social History Main Topics  . Smoking status: Former Smoker -- 1.00 packs/day for 30 years  . Smokeless tobacco: Current User    Types: Chew     Comment: Quit >15 years ago  . Alcohol Use: No  . Drug Use: No  . Sexual Activity: Not on file   Other Topics Concern  . Not on file   Social History Narrative   Married for >44 years    Family History  Problem Relation Age of Onset  . Heart disease Mother   . Congestive Heart Failure Father      Review of Systems:  See HPI for pertinent ROS. All other ROS negative.    Physical Exam: Blood pressure 106/62, pulse 68, temperature 98.1 F (36.7 C), temperature source Oral, resp. rate 18, weight 143 lb (64.864 kg)., Body mass index is 21.75 kg/(m^2). General: Thin WM. Appears in no acute distress. Lungs: Clear bilaterally to auscultation without wheezes, rales, or rhonchi. Breathing is unlabored. Heart: RRR with S1 S2. No murmurs, rubs, or gallops. Abdomen: Soft, non-tender, non-distended with normoactive bowel sounds. No hepatomegaly. No rebound/guarding. No obvious abdominal masses. Firmly palpated abdomen multiple times and he reports no areas of tenderness. Musculoskeletal:  Strength and tone normal for age. Extremities/Skin: Warm and dry. No clubbing or cyanosis. No edema. No rashes or suspicious lesions. Neuro: Alert and oriented X 3. Moves all extremities spontaneously. Gait is normal. CNII-XII grossly in tact. Psych:  Responds to questions appropriately with a normal affect.     ASSESSMENT AND PLAN:  73 y.o. year old male with   1. Weight Loss See HPI.  He has f/u OV with Dr. Wonda Amis 04/01/2014. His weight is up today compared to last weight check at Dr. Linda Hedges  office  2. CONSTIPATION  Cont Linzess, Mirilax, current tx  3. DIABETES MELLITUS, TYPE II  - Hemoglobin A1c  -MicroAlbumin Last microalbumin: 12/2012  Office visit 06/29/13 he reported he had stopped Lantus because of finances and doughnut hole. At that time A1c was actually good and he's been able to remain off of Lantus.  He provides excellent foot care. Reminded him of proper foot care and wearing shoes at all times.  He is overdue for ophthalmology exam. He states that he cannot afford this. He has a high co-pay.  He is on ACE inhibitor and statin therapy.  In the past metformin was decreased secondary to diarrhea. He is able to tolerate current dose.   4. HYPERLIPIDEMIA  09/28/13 FLP was at goal LFTs were nml   5. HYPERTENSION   On ACE inhibitor.  BP actually a little low at 106/62--pt says he has no lightheadedness, no presyncope.    6. AORTIC STENOSIS/ INSUFFICIENCY, NON-RHEUMATIC  He sees cardiology and have echocardiogram and carotid artery Dopplers done routinely with them.  However, he says he cannot afford copay of $50 to see them--i will order f/u Echo and Carotid Doppler now.  See # 7 and #8 below.  7. CARDIAC MURMUR Last 2-D echo was 08/28/2011.   8. CAROTID BRUIT  Last carotid Doppler was 12/30/12. Right ICA stenosis had progressed to 60-79%. Left ICA stenosis was stable at 40-59%. Definitely need to get followup on this.  9. BENIGN PROSTATIC HYPERTROPHY, WITH OBSTRUCTION  A screening PSA was done 09/25/12 and was normal. He is now 73 years old so can stop doing screening PSA.   10. Screening colonoscopy: 06/03/2009. Repeat 10-15 years. Had Colonoscopy --Dr. Rehman--11/26/13--Polyps. Internal and External Hemorrhoids.  11. Immunizations:  Pneumonia vaccine: Patient reports that he had this at age 20 his prior PCP  .--He would have received in Pneumovax 23.  Given The new guidelines we will need to give him the Prevnar 13. --WILL DISCUSS THIS AT NEXT REGULAR OV    Tetanus vaccine: Patient recently checked with his insurance and is going to cost him $40 and he deferred.  Zostavax: Patient check with his insurance. Too expensive. The patient defers.   F/U 3 months, sooner if needed.   Signed,     Signed, [address] [place], [place], [facility] 02/23/2014 8:21 AM

## 2014-02-24 ENCOUNTER — Telehealth: Payer: Self-pay | Admitting: Physician Assistant

## 2014-02-24 NOTE — Telephone Encounter (Signed)
Medication refilled per protocol. 

## 2014-02-24 NOTE — Telephone Encounter (Signed)
Attempted to contact pt he does not have voicemail set up he is needing to be scheduled for [employer] and CPE

## 2014-03-08 ENCOUNTER — Ambulatory Visit (HOSPITAL_BASED_OUTPATIENT_CLINIC_OR_DEPARTMENT_OTHER): Payer: Medicare Other | Admitting: Radiology

## 2014-03-08 ENCOUNTER — Ambulatory Visit (HOSPITAL_COMMUNITY): Payer: Medicare Other | Attending: Cardiology | Admitting: Cardiology

## 2014-03-08 DIAGNOSIS — R0989 Other specified symptoms and signs involving the circulatory and respiratory systems: Secondary | ICD-10-CM | POA: Insufficient documentation

## 2014-03-08 DIAGNOSIS — I359 Nonrheumatic aortic valve disorder, unspecified: Secondary | ICD-10-CM

## 2014-03-08 DIAGNOSIS — R011 Cardiac murmur, unspecified: Secondary | ICD-10-CM

## 2014-03-08 DIAGNOSIS — I6529 Occlusion and stenosis of unspecified carotid artery: Secondary | ICD-10-CM | POA: Insufficient documentation

## 2014-03-08 NOTE — Progress Notes (Signed)
Echocardiogram performed.  

## 2014-03-08 NOTE — Progress Notes (Signed)
Carotid duplex performed 

## 2014-03-24 ENCOUNTER — Encounter: Payer: Self-pay | Admitting: *Deleted

## 2014-03-29 ENCOUNTER — Telehealth: Payer: Self-pay | Admitting: *Deleted

## 2014-03-29 NOTE — Telephone Encounter (Signed)
Patient contacted office as result of letter.   Patient made aware of results of Echo and Carotid doppler tests.

## 2014-04-01 ENCOUNTER — Ambulatory Visit (INDEPENDENT_AMBULATORY_CARE_PROVIDER_SITE_OTHER): Payer: Medicare Other | Admitting: Internal Medicine

## 2014-04-01 ENCOUNTER — Encounter (INDEPENDENT_AMBULATORY_CARE_PROVIDER_SITE_OTHER): Payer: Self-pay | Admitting: Internal Medicine

## 2014-04-01 VITALS — BP 92/66 | HR 64 | Temp 98.2°F | Resp 18 | Ht 68.0 in | Wt 142.0 lb

## 2014-04-01 DIAGNOSIS — R634 Abnormal weight loss: Secondary | ICD-10-CM

## 2014-04-01 NOTE — Progress Notes (Signed)
Presenting complaint;  Followup for weight loss.  Subjective:  Patient is 73 year old Caucasian male who was last seen 4 months ago because of weight loss and history of polyps. He underwent colonoscopy on 12/04/2013 with removal of two tubular adenomas. Patient was asked to stop by the office for weight check on 02/08/2014 and he weighed 138 pounds. He has gained 4 pounds since his last visit. He states his baseline weight has been around 160 pounds. He has good appetite. In addition 3 irregular meals he is eating snacks. Heartburn is well controlled with PPI. He denies nausea vomiting dysphagia or abdominal pain. He has history of constipation. Also complains sporadic abdominal pain that she describes as gasping. Pain does not last long.  His bowels move once or twice daily. He does complain of intermittent chest tightness on physical exertion and such as when he pushes trash can to side of the road. He has not experienced shortness of breath palpitation or lightheadedness. He also complains of intermittent left hip and leg pain. At times he feels his leg will give but he has no difficulty walking and has not had any falling episodes. He denies fever chills or night sweats. He does walk some when he takes his dog for a walk.   Current Medications: Outpatient Encounter Prescriptions as of 04/01/2014  Medication Sig  . benazepril-hydrochlorthiazide (LOTENSIN HCT) 10-12.5 MG per tablet TAKE 1 TABLET BY MOUTH DAILY.  . fish oil-omega-3 fatty acids 1000 MG capsule Take 1 g by mouth daily. 1 tab po bid   . metFORMIN (GLUCOPHAGE) 500 MG tablet TAKE 1 TABLET BY MOUTH EVERY MORNING  . metoprolol succinate (TOPROL-XL) 25 MG 24 hr tablet TAKE 1 TABLET (25 MG TOTAL) BY MOUTH DAILY (INSURANCE?)  . omeprazole (PRILOSEC) 20 MG capsule Take 20 mg by mouth every morning.   . pravastatin (PRAVACHOL) 40 MG tablet TAKE 1 TABLET (40 MG TOTAL) BY MOUTH DAILY.  Marland Kitchen Cholecalciferol (VITAMIN D) 2000 UNITS tablet Take  2,000 Units by mouth every morning.      Objective: Blood pressure 92/66, pulse 64, temperature 98.2 F (36.8 C), temperature source Oral, resp. rate 18, height 5\' 8"  (1.727 m), weight 142 lb (64.411 kg). Patient is alert and in no acute distress. Conjunctiva is pink. Sclera is nonicteric Oropharyngeal mucosa is normal. No neck masses or thyromegaly noted. Cardiac exam with regular rhythm normal S1 and S2. He has grade 2/6 systolic ejection murmur best heard at aortic area. Lungs are clear to auscultation. Abdomen is flat. Bowel sounds are normal. No bruits noted. Abdomen is soft and nontender without organomegaly or masses.  No LE edema or clubbing noted. He has small lymph nodes in both axilla but they're soft and freely mobile and not tender. No inguinal adenopathy noted. External genitalia within normal limits.  Labs/studies Results: Lab data from 02/22/2014  WBC 6.4, H&H 14.7 and 44.1 platelet count 125K   serum sodium 138 potassium 4.4 chloride 99, CO2 29, BUN 16, creatinine 0.82 and glucose 125. Bilirubin 0.9, AP 62, AST 22, ALT 22 total protein 6.5, albumin 4.1 and calcium 9.4 Hemoglobin A1c was 6.4; it was 6.9 on 09/28/2013. TSH was 3.998. Chest and Abdominopelvic CT with contrast performed on 12/09/2013 was negative for lung mass adenopathy pancreatic lesion. It showed focal circumferential pitting to mid rectum felt to be due to peristalsis(abnormality noted on colonoscopy done 12/04/2013).   Assessment:  #1. Weight loss. It appears patient's weight loss has stopped. In fact he has gained 4 pounds since  his weight was checked on 02/08/2014. Weight loss may have been secondary to diabetes mellitus since his A1c was higher than it had been in the past and since then it is dropped down. Oral intake appears to be adequate and he does not any symptoms to suggest malabsorption. He is euthyroid. #2. Mild thrombocytopenia. His platelet count was 127K in June 2012 are normal 6 months  ago(151K). Etiology not clear but he does not have any stigmata of chronic liver disease her splenomegaly. #3. Exertional chest tightness. Asian had echocardiography on 03/08/2014 revealing diffuse hypokinesis he is low ejection fraction and grade 1 diastolic dysfunction and he had moderate to severe aortic stenosis. He will need to followup with his cardiologist but would ask Ms. Dixon PA-C to arrange this. #4  Intermittent left hip and leg pain.   Plan:  Weight check in 2 months. If he loses anymore weight we'll consider fasting cortisol level and a sedimentation rate. Patient will follow with PCP regarding chest pain as well as left leg and hip pain. Next colonoscopy in 5 years.

## 2014-04-01 NOTE — Patient Instructions (Signed)
Weight check in 2 months. Followup with [name]

## 2014-04-02 ENCOUNTER — Telehealth: Payer: Self-pay | Admitting: Physician Assistant

## 2014-04-02 NOTE — Telephone Encounter (Signed)
Please call patient and tell him that he really needs to schedule a followup appointment with cardiology. Patient has been having severe financial distress. (He is getting food from 4 different food pantries) And the reason he did not followup with cardiology so far has been he could not afford the co-pay. Tell him thatif  he can possibly get the money from anywhere for a co-pay or talk to the cardiology office about a payment plan that he really needs to follow up with cardiology --otherwise,  this could be a life-threatening problem. (Recent echo showed severely reduced pumping function of the heart and now he is telling Dr. Laural Golden that he is having chest tightness). I think he had seen Dr. wall at [facility] cardiology in the past but verify with patient. As well a think he was going to [place] office in [place] for his office visits in the past. Again verify with patient. Place order for cardiology appointment. Tell patient that if his chest tightness worsens prior to the cardiology appointment to go to the ER.

## 2014-04-02 NOTE — Telephone Encounter (Signed)
A lady from Dr Osborne Casco office called and stated that the pt came in and F/U with the Dr and stated pt weight is coming up some and his main concern was some complaining of chest tightness. And she states something about a recent Echo. They are on epic if you would like to review the office notes and if you need to contact Dr Corbin Ade he is at the hospital doing procedures today and his number is [phone]

## 2014-04-03 NOTE — Telephone Encounter (Signed)
Call placed to patient.   States that he will try to work on payment plan with cardiologist. Also advised of PA instructions that if chest tightness worsens, he should go to ER ASAP.   Verbalized understanding.

## 2014-04-08 ENCOUNTER — Encounter (HOSPITAL_COMMUNITY): Payer: Self-pay | Admitting: Emergency Medicine

## 2014-04-08 ENCOUNTER — Telehealth: Payer: Self-pay | Admitting: Adult Health

## 2014-04-08 ENCOUNTER — Inpatient Hospital Stay (HOSPITAL_COMMUNITY)
Admission: EM | Admit: 2014-04-08 | Discharge: 2014-04-20 | DRG: 217 | Disposition: A | Discharge status: Home / Self Care | Payer: Medicare Other | Attending: Cardiothoracic Surgery | Admitting: Cardiothoracic Surgery

## 2014-04-08 ENCOUNTER — Emergency Department (HOSPITAL_COMMUNITY): Payer: Medicare Other

## 2014-04-08 DIAGNOSIS — D62 Acute posthemorrhagic anemia: Secondary | ICD-10-CM | POA: Diagnosis not present

## 2014-04-08 DIAGNOSIS — I5022 Chronic systolic (congestive) heart failure: Secondary | ICD-10-CM | POA: Diagnosis present

## 2014-04-08 DIAGNOSIS — Y831 Surgical operation with implant of artificial internal device as the cause of abnormal reaction of the patient, or of later complication, without mention of misadventure at the time of the procedure: Secondary | ICD-10-CM | POA: Diagnosis not present

## 2014-04-08 DIAGNOSIS — K560 Paralytic ileus: Secondary | ICD-10-CM | POA: Diagnosis not present

## 2014-04-08 DIAGNOSIS — Z888 Allergy status to other drugs, medicaments and biological substances status: Secondary | ICD-10-CM | POA: Diagnosis not present

## 2014-04-08 DIAGNOSIS — Z860100 Personal history of colon polyps, unspecified: Secondary | ICD-10-CM

## 2014-04-08 DIAGNOSIS — Y9210 Unspecified residential institution as the place of occurrence of the external cause: Secondary | ICD-10-CM | POA: Diagnosis not present

## 2014-04-08 DIAGNOSIS — I658 Occlusion and stenosis of other precerebral arteries: Secondary | ICD-10-CM | POA: Diagnosis present

## 2014-04-08 DIAGNOSIS — I4891 Unspecified atrial fibrillation: Secondary | ICD-10-CM | POA: Diagnosis not present

## 2014-04-08 DIAGNOSIS — R197 Diarrhea, unspecified: Secondary | ICD-10-CM | POA: Diagnosis not present

## 2014-04-08 DIAGNOSIS — E119 Type 2 diabetes mellitus without complications: Secondary | ICD-10-CM | POA: Diagnosis present

## 2014-04-08 DIAGNOSIS — Z79899 Other long term (current) drug therapy: Secondary | ICD-10-CM | POA: Diagnosis not present

## 2014-04-08 DIAGNOSIS — D689 Coagulation defect, unspecified: Secondary | ICD-10-CM | POA: Diagnosis not present

## 2014-04-08 DIAGNOSIS — I472 Ventricular tachycardia, unspecified: Secondary | ICD-10-CM | POA: Diagnosis not present

## 2014-04-08 DIAGNOSIS — I519 Heart disease, unspecified: Secondary | ICD-10-CM | POA: Diagnosis not present

## 2014-04-08 DIAGNOSIS — E785 Hyperlipidemia, unspecified: Secondary | ICD-10-CM | POA: Diagnosis present

## 2014-04-08 DIAGNOSIS — E559 Vitamin D deficiency, unspecified: Secondary | ICD-10-CM | POA: Diagnosis present

## 2014-04-08 DIAGNOSIS — D696 Thrombocytopenia, unspecified: Secondary | ICD-10-CM | POA: Diagnosis not present

## 2014-04-08 DIAGNOSIS — I200 Unstable angina: Secondary | ICD-10-CM | POA: Diagnosis present

## 2014-04-08 DIAGNOSIS — I6529 Occlusion and stenosis of unspecified carotid artery: Secondary | ICD-10-CM | POA: Diagnosis present

## 2014-04-08 DIAGNOSIS — I447 Left bundle-branch block, unspecified: Secondary | ICD-10-CM

## 2014-04-08 DIAGNOSIS — I498 Other specified cardiac arrhythmias: Secondary | ICD-10-CM | POA: Diagnosis not present

## 2014-04-08 DIAGNOSIS — K5900 Constipation, unspecified: Secondary | ICD-10-CM | POA: Diagnosis not present

## 2014-04-08 DIAGNOSIS — I420 Dilated cardiomyopathy: Secondary | ICD-10-CM

## 2014-04-08 DIAGNOSIS — I359 Nonrheumatic aortic valve disorder, unspecified: Secondary | ICD-10-CM | POA: Diagnosis present

## 2014-04-08 DIAGNOSIS — K219 Gastro-esophageal reflux disease without esophagitis: Secondary | ICD-10-CM | POA: Diagnosis present

## 2014-04-08 DIAGNOSIS — I10 Essential (primary) hypertension: Secondary | ICD-10-CM | POA: Diagnosis present

## 2014-04-08 DIAGNOSIS — Z8249 Family history of ischemic heart disease and other diseases of the circulatory system: Secondary | ICD-10-CM

## 2014-04-08 DIAGNOSIS — Z885 Allergy status to narcotic agent status: Secondary | ICD-10-CM | POA: Diagnosis not present

## 2014-04-08 DIAGNOSIS — Z7982 Long term (current) use of aspirin: Secondary | ICD-10-CM | POA: Diagnosis not present

## 2014-04-08 DIAGNOSIS — N400 Enlarged prostate without lower urinary tract symptoms: Secondary | ICD-10-CM | POA: Diagnosis present

## 2014-04-08 DIAGNOSIS — R079 Chest pain, unspecified: Secondary | ICD-10-CM | POA: Diagnosis present

## 2014-04-08 DIAGNOSIS — I428 Other cardiomyopathies: Secondary | ICD-10-CM | POA: Diagnosis present

## 2014-04-08 DIAGNOSIS — J9819 Other pulmonary collapse: Secondary | ICD-10-CM | POA: Diagnosis not present

## 2014-04-08 DIAGNOSIS — Z87891 Personal history of nicotine dependence: Secondary | ICD-10-CM

## 2014-04-08 DIAGNOSIS — I9789 Other postprocedural complications and disorders of the circulatory system, not elsewhere classified: Secondary | ICD-10-CM | POA: Diagnosis not present

## 2014-04-08 DIAGNOSIS — Z952 Presence of prosthetic heart valve: Secondary | ICD-10-CM

## 2014-04-08 DIAGNOSIS — I4729 Other ventricular tachycardia: Secondary | ICD-10-CM | POA: Diagnosis not present

## 2014-04-08 DIAGNOSIS — I5020 Unspecified systolic (congestive) heart failure: Secondary | ICD-10-CM

## 2014-04-08 DIAGNOSIS — I350 Nonrheumatic aortic (valve) stenosis: Secondary | ICD-10-CM

## 2014-04-08 LAB — BASIC METABOLIC PANEL
Anion gap: 12 (ref 5–15)
BUN: 16 mg/dL (ref 6–23)
CO2: 29 mEq/L (ref 19–32)
Calcium: 10 mg/dL (ref 8.4–10.5)
Chloride: 100 mEq/L (ref 96–112)
Creatinine, Ser: 0.85 mg/dL (ref 0.50–1.35)
GFR calc Af Amer: >90 mL/min (ref >90)
GFR calc non Af Amer: 84 mL/min — ABNORMAL LOW (ref >90)
Glucose, Bld: 86 mg/dL (ref 70–99)
Potassium: 4 mEq/L (ref 3.7–5.3)
Sodium: 141 mEq/L (ref 137–147)

## 2014-04-08 LAB — CBC WITH DIFFERENTIAL/PLATELET
Basophils Absolute: 0.1 10*3/uL (ref 0.0–0.1)
Basophils Relative: 1 % (ref 0–1)
Eosinophils Absolute: 0.1 10*3/uL (ref 0.0–0.7)
Eosinophils Relative: 2 % (ref 0–5)
HEMATOCRIT: 43.1 % (ref 39.0–52.0)
Hemoglobin: 14.8 g/dL (ref 13.0–17.0)
LYMPHS PCT: 31 % (ref 12–46)
Lymphs Abs: 1.8 10*3/uL (ref 0.7–4.0)
MCH: 31.7 pg (ref 26.0–34.0)
MCHC: 34.3 g/dL (ref 30.0–36.0)
MCV: 92.3 fL (ref 78.0–100.0)
MONO ABS: 0.4 10*3/uL (ref 0.1–1.0)
Monocytes Relative: 7 % (ref 3–12)
Neutro Abs: 3.5 10*3/uL (ref 1.7–7.7)
Neutrophils Relative %: 60 % (ref 43–77)
Platelets: 115 10*3/uL — ABNORMAL LOW (ref 150–400)
RBC: 4.67 MIL/uL (ref 4.22–5.81)
RDW: 13.7 % (ref 11.5–15.5)
WBC: 5.9 10*3/uL (ref 4.0–10.5)

## 2014-04-08 LAB — CBG MONITORING, ED: Glucose-Capillary: 102 mg/dL — ABNORMAL HIGH (ref 70–99)

## 2014-04-08 LAB — GLUCOSE, CAPILLARY: GLUCOSE-CAPILLARY: 141 mg/dL — AB (ref 70–99)

## 2014-04-08 LAB — TROPONIN I: Troponin I: <0.3 ng/mL (ref <0.30)

## 2014-04-08 LAB — MRSA PCR SCREENING: MRSA by PCR: NEGATIVE

## 2014-04-08 MED ORDER — CETYLPYRIDINIUM CHLORIDE 0.05 % MT LIQD: 7.0000 mL | Freq: Two times a day (BID) | OROMUCOSAL | Status: DC

## 2014-04-08 MED ORDER — SODIUM CHLORIDE 0.9 % IV SOLN: 1000.0000 mL | Freq: Once | INTRAVENOUS | Status: DC

## 2014-04-08 MED ORDER — ASPIRIN EC 81 MG PO TBEC
81.0000 mg | DELAYED_RELEASE_TABLET | Freq: Every day | ORAL | Status: DC
  Filled 2014-04-08: qty 1

## 2014-04-08 MED ORDER — ONDANSETRON HCL 4 MG/2ML IJ SOLN: 4.0000 mg | Freq: Four times a day (QID) | INTRAMUSCULAR | Status: DC | PRN

## 2014-04-08 MED ORDER — ACETAMINOPHEN 325 MG PO TABS: 650.0000 mg | ORAL_TABLET | ORAL | Status: DC | PRN

## 2014-04-08 MED ORDER — ASPIRIN 81 MG PO CHEW
324.0000 mg | CHEWABLE_TABLET | Freq: Once | ORAL | Status: AC
  Administered 2014-04-08: 324 mg via ORAL
  Filled 2014-04-08: qty 4

## 2014-04-08 MED ORDER — NITROGLYCERIN 2 % TD OINT
1.0000 [in_us] | TOPICAL_OINTMENT | Freq: Four times a day (QID) | TRANSDERMAL | Status: DC
  Administered 2014-04-08: 1 [in_us] via TOPICAL
  Filled 2014-04-08: qty 1

## 2014-04-08 MED ORDER — OMEGA-3 FATTY ACIDS 1000 MG PO CAPS: 1.0000 g | ORAL_CAPSULE | Freq: Every day | ORAL | Status: DC

## 2014-04-08 MED ORDER — OMEGA-3-ACID ETHYL ESTERS 1 G PO CAPS
1.0000 g | ORAL_CAPSULE | Freq: Every day | ORAL | Status: DC
  Administered 2014-04-09 – 2014-04-11 (×3): 1 g via ORAL
  Filled 2014-04-08 (×4): qty 1

## 2014-04-08 MED ORDER — PRAVASTATIN SODIUM 40 MG PO TABS
40.0000 mg | ORAL_TABLET | Freq: Every day | ORAL | Status: DC
  Administered 2014-04-09 – 2014-04-20 (×10): 40 mg via ORAL
  Filled 2014-04-08 (×12): qty 1

## 2014-04-08 MED ORDER — HEPARIN BOLUS VIA INFUSION
4000.0000 [IU] | Freq: Once | INTRAVENOUS | Status: AC
  Administered 2014-04-08: 4000 [IU] via INTRAVENOUS

## 2014-04-08 MED ORDER — NITROGLYCERIN 0.4 MG SL SUBL
0.4000 mg | SUBLINGUAL_TABLET | SUBLINGUAL | Status: DC | PRN
  Administered 2014-04-08 (×2): 0.4 mg via SUBLINGUAL
  Filled 2014-04-08: qty 1

## 2014-04-08 MED ORDER — SIMVASTATIN 40 MG PO TABS: 40.0000 mg | ORAL_TABLET | Freq: Every day | ORAL | Status: DC

## 2014-04-08 MED ORDER — HEPARIN (PORCINE) IN NACL 100-0.45 UNIT/ML-% IJ SOLN
800.0000 [IU]/h | INTRAMUSCULAR | Status: DC
  Administered 2014-04-08: 800 [IU]/h via INTRAVENOUS
  Filled 2014-04-08 (×2): qty 250

## 2014-04-08 MED ORDER — CETYLPYRIDINIUM CHLORIDE 0.05 % MT LIQD
7.0000 mL | Freq: Two times a day (BID) | OROMUCOSAL | Status: DC
  Administered 2014-04-09 – 2014-04-12 (×7): 7 mL via OROMUCOSAL

## 2014-04-08 NOTE — ED Notes (Signed)
MD at bedside. 

## 2014-04-08 NOTE — Telephone Encounter (Signed)
Prior authorization needed / please see refill bin / tgs
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
## 2014-04-08 NOTE — ED Notes (Signed)
EDP aware. Pt chest pain/tightness free at this time.

## 2014-04-08 NOTE — ED Notes (Signed)
Cardiology MD at bedside.

## 2014-04-08 NOTE — Progress Notes (Signed)
ANTICOAGULATION CONSULT NOTE - Initial Consult  Pharmacy Consult for Heparin Indication: chest pain/ACS  Allergies  Allergen Reactions  . Actos [Pioglitazone] Other (See Comments)    Cramps in legs   . Morphine     REACTION: hives  . Statins     REACTION: leg cramps but tolerates pravastatin    Patient Measurements: Height: 5\' 8"  (172.7 cm) Weight: 142 lb (64.411 kg) IBW/kg (Calculated) : 68.4  Vital Signs: Temp: 97.7 F (36.5 C) (09/03 1414) Temp src: Oral (09/03 1414) BP: 110/55 mmHg (09/03 1630) Pulse Rate: 82 (09/03 1630)  Labs:  Recent Labs  04/08/14 1301  HGB 14.8  HCT 43.1  PLT 115*  CREATININE 0.85  TROPONINI <0.30    Estimated Creatinine Clearance: 69.5 ml/min (by C-G formula based on Cr of 0.85).   Medical History: Past Medical History  Diagnosis Date  . Hyperlipidemia   . Hypertension   . Vitamin D deficiency   . Diabetes mellitus   . GERD (gastroesophageal reflux disease)   . BPH (benign prostatic hypertrophy)   . History of cataract   . Constipation   . CAD (coronary artery disease)   . Aortic stenosis, mild   . Cancer     spindle cell cancer of left ear  . Nephrolithiasis     Medications:  Scheduled:  . nitroGLYCERIN  1 inch Topical 4 times per day    Assessment: 73 yoM with hx CAD presents with ongoing chest pain and worsening dyspnea on exertion.  Cardiac enzymes are negative.  He is noted to have decrease in LVEF per ECHO and heart cath is planned for tomorrow.  CBC reviewed.  Low baseline pltc.   Goal of Therapy:  Heparin level 0.3-0.7 units/ml Monitor platelets by anticoagulation protocol: Yes   Plan:  Give 4000 units bolus x 1 followed by heparin infusion at 800 units/hr Check 6hr heparin level Daily heparin level & CBC while on heparin  Biagio Borg 04/08/2014,5:20 PM

## 2014-04-08 NOTE — Telephone Encounter (Signed)
Called [facility] with correct [place] number per [employer] [phone] given to pharmacist Chrystal    Prescription went through and [facility] will call pt

## 2014-04-08 NOTE — ED Notes (Signed)
Pt belching during [employer]. nad noted.

## 2014-04-08 NOTE — ED Notes (Signed)
Diet tray given to pt.

## 2014-04-08 NOTE — ED Notes (Signed)
Blood work in progress. Pt waiting with no complaints at this time.

## 2014-04-08 NOTE — ED Notes (Signed)
Report given to Joellyn Rued, RN

## 2014-04-08 NOTE — ED Notes (Signed)
PT c/o left sided chest tightness x1 day with SOB on exertion.

## 2014-04-08 NOTE — ED Notes (Signed)
Report given to Wes, RN with [facility]. 3rd shift [facility] will transport pt when available.
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
## 2014-04-08 NOTE — ED Notes (Signed)
Assumed care of pt at this time.
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
## 2014-04-09 ENCOUNTER — Encounter (HOSPITAL_COMMUNITY)
Admission: EM | Disposition: A | Discharge status: Home / Self Care | Payer: Self-pay | Source: Home / Self Care | Attending: Cardiothoracic Surgery

## 2014-04-09 ENCOUNTER — Inpatient Hospital Stay (HOSPITAL_COMMUNITY)
Admit: 2014-04-09 | Discharge: 2014-04-09 | Disposition: A | Discharge status: Home / Self Care | Payer: Medicare Other | Attending: Cardiothoracic Surgery | Admitting: Cardiothoracic Surgery

## 2014-04-09 ENCOUNTER — Encounter (HOSPITAL_COMMUNITY): Payer: Self-pay | Admitting: *Deleted

## 2014-04-09 ENCOUNTER — Other Ambulatory Visit: Payer: Self-pay

## 2014-04-09 DIAGNOSIS — I359 Nonrheumatic aortic valve disorder, unspecified: Secondary | ICD-10-CM

## 2014-04-09 DIAGNOSIS — I200 Unstable angina: Secondary | ICD-10-CM

## 2014-04-09 DIAGNOSIS — I5022 Chronic systolic (congestive) heart failure: Secondary | ICD-10-CM

## 2014-04-09 HISTORY — PX: LEFT AND RIGHT HEART CATHETERIZATION WITH CORONARY ANGIOGRAM: SHX5449

## 2014-04-09 LAB — PULMONARY FUNCTION TEST
FEF 25-75 Post: 2.48 L/sec
FEF 25-75 Pre: 2.05 L/sec
FEF2575-%Change-Post: 21 %
FEF2575-%Pred-Post: 127 %
FEF2575-%Pred-Pre: 104 %
FEV1-%Change-Post: 4 %
FEV1-%Pred-Post: 96 %
FEV1-%Pred-Pre: 92 %
FEV1-Post: 2.6 L
FEV1-Pre: 2.5 L
FEV1FVC-%Change-Post: 10 %
FEV1FVC-%Pred-Pre: 103 %
FEV6-%Change-Post: -4 %
FEV6-%Pred-Post: 88 %
FEV6-%Pred-Pre: 92 %
FEV6-Post: 3.09 L
FEV6-Pre: 3.24 L
FEV6FVC-%Change-Post: 1 %
FEV6FVC-%Pred-Post: 107 %
FEV6FVC-%Pred-Pre: 105 %
FVC-%Change-Post: -6 %
FVC-%Pred-Post: 82 %
FVC-%Pred-Pre: 88 %
FVC-Post: 3.09 L
FVC-Pre: 3.29 L
Post FEV1/FVC ratio: 84 %
Post FEV6/FVC ratio: 100 %
Pre FEV1/FVC ratio: 76 %
Pre FEV6/FVC Ratio: 98 %

## 2014-04-09 LAB — CBC
HCT: 40.6 % (ref 39.0–52.0)
Hemoglobin: 14 g/dL (ref 13.0–17.0)
MCH: 31.5 pg (ref 26.0–34.0)
MCHC: 34.5 g/dL (ref 30.0–36.0)
MCV: 91.2 fL (ref 78.0–100.0)
PLATELETS: 107 10*3/uL — AB (ref 150–400)
RBC: 4.45 MIL/uL (ref 4.22–5.81)
RDW: 13.8 % (ref 11.5–15.5)
WBC: 5.1 10*3/uL (ref 4.0–10.5)

## 2014-04-09 LAB — BASIC METABOLIC PANEL
ANION GAP: 10 (ref 5–15)
BUN: 18 mg/dL (ref 6–23)
CHLORIDE: 101 meq/L (ref 96–112)
CO2: 28 meq/L (ref 19–32)
Calcium: 9.2 mg/dL (ref 8.4–10.5)
Creatinine, Ser: 0.92 mg/dL (ref 0.50–1.35)
GFR calc non Af Amer: 81 mL/min — ABNORMAL LOW (ref >90)
Glucose, Bld: 122 mg/dL — ABNORMAL HIGH (ref 70–99)
POTASSIUM: 3.9 meq/L (ref 3.7–5.3)
SODIUM: 139 meq/L (ref 137–147)

## 2014-04-09 LAB — HEPARIN LEVEL (UNFRACTIONATED)
HEPARIN UNFRACTIONATED: 0.37 [IU]/mL (ref 0.30–0.70)
Heparin Unfractionated: 0.34 IU/mL (ref 0.30–0.70)

## 2014-04-09 LAB — PROTIME-INR
INR: 1.23 (ref 0.00–1.49)
PROTHROMBIN TIME: 15.5 s — AB (ref 11.6–15.2)

## 2014-04-09 LAB — GLUCOSE, CAPILLARY
GLUCOSE-CAPILLARY: 98 mg/dL (ref 70–99)
GLUCOSE-CAPILLARY: 80 mg/dL (ref 70–99)
Glucose-Capillary: 102 mg/dL — ABNORMAL HIGH (ref 70–99)
Glucose-Capillary: 191 mg/dL — ABNORMAL HIGH (ref 70–99)

## 2014-04-09 SURGERY — LEFT AND RIGHT HEART CATHETERIZATION WITH CORONARY ANGIOGRAM
Anesthesia: LOCAL

## 2014-04-09 MED ORDER — SODIUM CHLORIDE 0.9 % IJ SOLN
3.0000 mL | Freq: Two times a day (BID) | INTRAMUSCULAR | Status: DC
  Administered 2014-04-09: 3 mL via INTRAVENOUS

## 2014-04-09 MED ORDER — ONDANSETRON HCL 4 MG/2ML IJ SOLN: 4.0000 mg | Freq: Four times a day (QID) | INTRAMUSCULAR | Status: DC | PRN

## 2014-04-09 MED ORDER — SODIUM CHLORIDE 0.9 % IV SOLN: INTRAVENOUS | Status: DC

## 2014-04-09 MED ORDER — LIDOCAINE HCL (PF) 1 % IJ SOLN
INTRAMUSCULAR | Status: AC
  Filled 2014-04-09: qty 30

## 2014-04-09 MED ORDER — SODIUM CHLORIDE 0.9 % IV SOLN: 250.0000 mL | INTRAVENOUS | Status: DC | PRN

## 2014-04-09 MED ORDER — FENTANYL CITRATE 0.05 MG/ML IJ SOLN
INTRAMUSCULAR | Status: AC
  Filled 2014-04-09: qty 2

## 2014-04-09 MED ORDER — HEPARIN (PORCINE) IN NACL 2-0.9 UNIT/ML-% IJ SOLN
INTRAMUSCULAR | Status: AC
  Filled 2014-04-09: qty 1000

## 2014-04-09 MED ORDER — SODIUM CHLORIDE 0.9 % IV SOLN: 1.0000 mL/kg/h | INTRAVENOUS | Status: AC

## 2014-04-09 MED ORDER — SODIUM CHLORIDE 0.9 % IJ SOLN: 3.0000 mL | INTRAMUSCULAR | Status: DC | PRN

## 2014-04-09 MED ORDER — ASPIRIN 81 MG PO CHEW
81.0000 mg | CHEWABLE_TABLET | ORAL | Status: AC
  Administered 2014-04-09: 81 mg via ORAL
  Filled 2014-04-09: qty 1

## 2014-04-09 MED ORDER — ASPIRIN EC 81 MG PO TBEC
81.0000 mg | DELAYED_RELEASE_TABLET | Freq: Every day | ORAL | Status: DC
  Administered 2014-04-10 – 2014-04-12 (×3): 81 mg via ORAL
  Filled 2014-04-09 (×4): qty 1

## 2014-04-09 MED ORDER — ACETAMINOPHEN 325 MG PO TABS: 650.0000 mg | ORAL_TABLET | ORAL | Status: DC | PRN

## 2014-04-09 MED ORDER — MIDAZOLAM HCL 2 MG/2ML IJ SOLN
INTRAMUSCULAR | Status: AC
  Filled 2014-04-09: qty 2

## 2014-04-09 MED ORDER — HEPARIN SODIUM (PORCINE) 1000 UNIT/ML IJ SOLN
INTRAMUSCULAR | Status: AC
  Filled 2014-04-09: qty 1

## 2014-04-09 MED ORDER — ASPIRIN 81 MG PO CHEW
81.0000 mg | CHEWABLE_TABLET | Freq: Every day | ORAL | Status: DC
  Administered 2014-04-09: 81 mg via ORAL
  Filled 2014-04-09: qty 1

## 2014-04-09 MED ORDER — ALBUTEROL SULFATE (2.5 MG/3ML) 0.083% IN NEBU
2.5000 mg | INHALATION_SOLUTION | Freq: Once | RESPIRATORY_TRACT | Status: AC
  Administered 2014-04-09: 2.5 mg via RESPIRATORY_TRACT

## 2014-04-09 MED ORDER — VERAPAMIL HCL 2.5 MG/ML IV SOLN
INTRAVENOUS | Status: AC
  Filled 2014-04-09: qty 2

## 2014-04-09 MED ORDER — NITROGLYCERIN IN D5W 200-5 MCG/ML-% IV SOLN
10.0000 ug/min | INTRAVENOUS | Status: DC
  Administered 2014-04-09: 10 ug/min via INTRAVENOUS
  Filled 2014-04-09: qty 250

## 2014-04-09 NOTE — Progress Notes (Signed)
Utilization Review Completed.Donne Anon T9/11/2013

## 2014-04-09 NOTE — Interval H&P Note (Signed)
Cath Lab Visit (complete for each Cath Lab visit)  Clinical Evaluation Leading to the Procedure:   ACS: Yes.    Non-ACS:    Anginal Classification: CCS IV  Anti-ischemic medical therapy: No Therapy  Non-Invasive Test Results: No non-invasive testing performed  Prior CABG: No previous CABG      History and Physical Interval Note:  04/09/2014 1:28 PM  [name]  has presented today for surgery, with the diagnosis of cp  The various methods of treatment have been discussed with the patient and family. After consideration of risks, benefits and other options for treatment, the patient has consented to  Procedure(s): LEFT AND RIGHT HEART CATHETERIZATION WITH CORONARY ANGIOGRAM (N/A) as a surgical intervention .  The patient's history has been reviewed, patient examined, no change in status, stable for surgery.  I have reviewed the patient's chart and labs.  Questions were answered to the patient's satisfaction.     VARANASI,JAYADEEP S.

## 2014-04-09 NOTE — CV Procedure (Addendum)
       PROCEDURE:  Right heart catheterization; Left heart catheterization with selective coronary angiography, left ventriculogram.  INDICATIONS:  Unstable angina; Cardiomyopathy; severe aortic stenosis  The risks, benefits, and details of the procedure were explained to the patient.  The patient verbalized understanding and wanted to proceed.  Informed written consent was obtained.  PROCEDURE TECHNIQUE:  After Xylocaine anesthesia a 18F slender sheath was placed in the right radial artery with a single anterior needle wall stick.   IV heparin was given. Right coronary angiography was done using a Judkins R4 guide catheter.  Left coronary angiography was done using an EBU 3.0 guide catheter.  The aortic valve was crossed with a straight wire and a Feldman catheter. The pigtail was exchanged over a J-wire. Left ventriculography was done using a pigtail catheter.  A TR band was used for hemostasis.   CONTRAST:  Total of 90 cc.  COMPLICATIONS:  None.    HEMODYNAMICS:  Aortic pressure was 104/50; LV pressure was 144/6; LVEDP 9.  There was no gradient between the left ventricle and aorta.  RA pressure 4/2, mean RA pressure 1 mmHg; RV pressure twenty-three over zero, RVEDP 2 mmHg; PA pressure 17/6; mean PA pressure 11; pulmonary capillary wedge pressure 8/6, mean pulmonary capillary wedge pressure 5 mmHg; PA sat 70%; aortic saturation 91%. Cardiac output 5.85 L per minute. Cardiac index 3.3  ANGIOGRAPHIC DATA:   The left main coronary artery is widely patent.  The left anterior descending artery is a large vessel. There is mild atherosclerosis proximally. The first diagonal is large and widely patent. There is mild disease in the mid to distal LAD.  The left circumflex artery is a large vessel. There is moderate eccentric atherosclerosis in the mid vessel. There is a large OM1 which is widely patent.  There does not appear to be any significant obstructive disease.  The right coronary artery is a  large, dominant vessel. There is mild, diffuse atherosclerosis throughout the RCA. The posterior descending artery is large. The posterior lateral artery is trivial. There is no significant disease in the RCA system.  LEFT VENTRICULOGRAM:  Left ventricular angiogram was done in the 30 RAO projection and revealed moderate to severely decreased systolic function globally with an estimated ejection fraction of 35%.  LVEDP was 9 mmHg.  IMPRESSIONS:  1. Normal left main coronary artery. 2. Mild scattered disease in the left anterior descending artery and its branches. 3. Mild to moderate scattered disease in the left circumflex artery and its branches. 4. Mild diffuse disease in the right coronary artery. 5. Moderate to severely decreased left ventricular systolic function.  LVEDP 9 mmHg.  Ejection fraction 35 %. 6.  No evidence of pulmonary hypertension. Cardiac output 5.85 L per minute. Will give the patient some IV fluids given the above hemodynamics.  Severe aortic stenosis with calculated valve area of 0.9 cm2.  RECOMMENDATION:  Coronary artery disease does not explain his decreased EF. I suspect his severe aortic stenosis is the cause of his cardiomyopathy.  It is severe based on hemodynamics from the cardiac catheterization. Would continue with plans for cardiac surgery evaluation for aortic valve replacement.

## 2014-04-09 NOTE — Progress Notes (Addendum)
       Patient Name: [name] Date of Encounter: 04/09/2014    SUBJECTIVE: 73 yo with AS, LV systolic dysfunction and chest pain. Pain is currently present.  TELEMETRY:  Sinus brady with PVC;s: Filed Vitals:   04/09/14 0000 04/09/14 0400 04/09/14 0500 04/09/14 0700  BP: 94/64 106/53  121/63  Pulse:    69  Temp: 97.9 F (36.6 C) 98.1 F (36.7 C)  97.6 F (36.4 C)  TempSrc: Oral Oral  Oral  Resp: 16 17    Height:    5\' 8"  (1.727 m)  Weight:   138 lb (62.596 kg) 139 lb 8.8 oz (63.3 kg)  SpO2: 95% 93%  99%    Intake/Output Summary (Last 24 hours) at 04/09/14 0828 Last data filed at 04/09/14 0500  Gross per 24 hour  Intake     88 ml  Output      0 ml  Net     88 ml   LABS: Basic Metabolic Panel:  Recent Labs  04/08/14 1301 04/09/14 0048  NA 141 139  K 4.0 3.9  CL 100 101  CO2 29 28  GLUCOSE 86 122*  BUN 16 18  CREATININE 0.85 0.92  CALCIUM 10.0 9.2   CBC:  Recent Labs  04/08/14 1301 04/09/14 0512  WBC 5.9 5.1  NEUTROABS 3.5  --   HGB 14.8 14.0  HCT 43.1 40.6  MCV 92.3 91.2  PLT 115* 107*   Cardiac Enzymes:  Recent Labs  04/08/14 1301  TROPONINI <0.30   BNP No results found for this basename: probnp      Radiology/Studies:  No CHF  Physical Exam: Blood pressure 121/63, pulse 69, temperature 97.6 F (36.4 C), temperature source Oral, resp. rate 17, height 5\' 8"  (1.727 m), weight 139 lb 8.8 oz (63.3 kg), SpO2 99.00%. Weight change:   Wt Readings from Last 3 Encounters:  04/09/14 139 lb 8.8 oz (63.3 kg)  04/09/14 139 lb 8.8 oz (63.3 kg)  04/01/14 142 lb (64.411 kg)   Chest is clear. Brady with 3/6 systolic murmur Good radial and femoral pulses, although 1 + on right  ASSESSMENT:  1. Unstable angina 2. Chronic systolic HF 3. Severe AS  Plan:  1. IV NTG 2. Left and right heart cath today 3. IV saline.  Demetrios Isaacs 04/09/2014, 8:28 AM

## 2014-04-09 NOTE — Progress Notes (Signed)
ANTICOAGULATION CONSULT NOTE - Follow-up Consult  Pharmacy Consult for Heparin Indication: chest pain/ACS  Allergies  Allergen Reactions  . Actos [Pioglitazone] Other (See Comments)    Cramps in legs   . Morphine     REACTION: hives  . Statins     REACTION: leg cramps but tolerates pravastatin    Patient Measurements: Height: 5\' 8"  (172.7 cm) Weight: 141 lb 12.1 oz (64.3 kg) IBW/kg (Calculated) : 68.4  Vital Signs: Temp: 97.9 F (36.6 C) (09/04 0000) Temp src: Oral (09/04 0000) BP: 94/64 mmHg (09/04 0000) Pulse Rate: 53 (09/03 2100)  Labs:  Recent Labs  04/08/14 1301 04/09/14 0048  HGB 14.8  --   HCT 43.1  --   PLT 115*  --   HEPARINUNFRC  --  0.34  CREATININE 0.85 0.92  TROPONINI <0.30  --     Estimated Creatinine Clearance: 64.1 ml/min (by C-G formula based on Cr of 0.92).  Assessment: 61 yoM on heparin for r/o ACS. with hx CAD presents with ongoing chest pain and worsening dyspnea on exertion.  Cardiac enzymes are negative.  He is noted to have decrease in LVEF per ECHO and heart cath is planned for tomorrow.  CBC reviewed.  Low baseline pltc.   Goal of Therapy:  Heparin level 0.3-0.7 units/ml Monitor platelets by anticoagulation protocol: Yes   Plan:  Continue heparin infusion at 800 units/hr Daily heparin level & CBC while on heparin  Sherlon Handing, PharmD, BCPS Clinical pharmacist, pager [phone] 04/09/2014,1:32 AM

## 2014-04-09 NOTE — Progress Notes (Signed)
Site area: rt brachial Site Prior to Removal:  Level 0 Pressure Applied For: 10 minutes Manual:   yes Patient Status During Pull:  stable Post Pull Site:  Level 0 Post Pull Instructions Given:  yes Post Pull Pulses Present: yes Dressing Applied:  yes Bedrest begins @ na Comments: no complications

## 2014-04-09 NOTE — H&P (View-Only) (Signed)
       Patient Name: SAIFULLAH JOLLEY Date of Encounter: 04/09/2014    SUBJECTIVE: 73 yo with AS, LV systolic dysfunction and chest pain. Pain is currently present.  TELEMETRY:  Sinus brady with PVC;s: Filed Vitals:   04/09/14 0000 04/09/14 0400 04/09/14 0500 04/09/14 0700  BP: 94/64 106/53  121/63  Pulse:    69  Temp: 97.9 F (36.6 C) 98.1 F (36.7 C)  97.6 F (36.4 C)  TempSrc: Oral Oral  Oral  Resp: 16 17    Height:    5\' 8"  (1.727 m)  Weight:   138 lb (62.596 kg) 139 lb 8.8 oz (63.3 kg)  SpO2: 95% 93%  99%    Intake/Output Summary (Last 24 hours) at 04/09/14 0828 Last data filed at 04/09/14 0500  Gross per 24 hour  Intake     88 ml  Output      0 ml  Net     88 ml   LABS: Basic Metabolic Panel:  Recent Labs  04/08/14 1301 04/09/14 0048  NA 141 139  K 4.0 3.9  CL 100 101  CO2 29 28  GLUCOSE 86 122*  BUN 16 18  CREATININE 0.85 0.92  CALCIUM 10.0 9.2   CBC:  Recent Labs  04/08/14 1301 04/09/14 0512  WBC 5.9 5.1  NEUTROABS 3.5  --   HGB 14.8 14.0  HCT 43.1 40.6  MCV 92.3 91.2  PLT 115* 107*   Cardiac Enzymes:  Recent Labs  04/08/14 1301  TROPONINI <0.30   BNP No results found for this basename: probnp      Radiology/Studies:  No CHF  Physical Exam: Blood pressure 121/63, pulse 69, temperature 97.6 F (36.4 C), temperature source Oral, resp. rate 17, height 5\' 8"  (1.727 m), weight 139 lb 8.8 oz (63.3 kg), SpO2 99.00%. Weight change:   Wt Readings from Last 3 Encounters:  04/09/14 139 lb 8.8 oz (63.3 kg)  04/09/14 139 lb 8.8 oz (63.3 kg)  04/01/14 142 lb (64.411 kg)   Chest is clear. Brady with 3/6 systolic murmur Good radial and femoral pulses, although 1 + on right  ASSESSMENT:  1. Unstable angina 2. Chronic systolic HF 3. Severe AS  Plan:  1. IV NTG 2. Left and right heart cath today 3. IV saline.  Demetrios Isaacs 04/09/2014, 8:28 AM

## 2014-04-09 NOTE — Progress Notes (Signed)
ANTICOAGULATION CONSULT NOTE - Follow-up Consult  Pharmacy Consult for Heparin Indication: chest pain/ACS  Allergies  Allergen Reactions  . Actos [Pioglitazone] Other (See Comments)    Cramps in legs   . Morphine     REACTION: hives  . Statins     REACTION: leg cramps but tolerates pravastatin    Patient Measurements: Height: 5\' 8"  (172.7 cm) Weight: 138 lb (62.596 kg) IBW/kg (Calculated) : 68.4  Vital Signs: Temp: 98.1 F (36.7 C) (09/04 0400) Temp src: Oral (09/04 0400) BP: 106/53 mmHg (09/04 0400) Pulse Rate: 53 (09/03 2100)  Labs:  Recent Labs  04/08/14 1301 04/09/14 0048 04/09/14 0512  HGB 14.8  --  14.0  HCT 43.1  --  40.6  PLT 115*  --  107*  LABPROT  --   --  15.5*  INR  --   --  1.23  HEPARINUNFRC  --  0.34 0.37  CREATININE 0.85 0.92  --   TROPONINI <0.30  --   --     Estimated Creatinine Clearance: 62.4 ml/min (by C-G formula based on Cr of 0.92).  . heparin 800 Units/hr (04/08/14 2200)     Assessment: 77 yoM on heparin for r/o ACS. with hx CAD presents with ongoing chest pain and worsening dyspnea on exertion.  Cardiac enzymes are negative.  He is noted to have decrease in LVEF per ECHO and heart cath is planned for today.  CBC stable.  Low baseline platelet count, but stable since start of IV heparin.   Goal of Therapy:  Heparin level 0.3-0.7 units/ml Monitor platelets by anticoagulation protocol: Yes   Plan:  Continue heparin infusion at 800 units/hr Daily heparin level & CBC while on heparin Follow-up plans for anticoagulation after cath lab.  Uvaldo Rising, BCPS  Clinical Pharmacist Pager [phone]  04/09/2014 7:16 AM

## 2014-04-10 ENCOUNTER — Inpatient Hospital Stay (HOSPITAL_COMMUNITY): Payer: Medicare Other

## 2014-04-10 DIAGNOSIS — Z01810 Encounter for preprocedural cardiovascular examination: Secondary | ICD-10-CM

## 2014-04-10 DIAGNOSIS — I519 Heart disease, unspecified: Secondary | ICD-10-CM

## 2014-04-10 DIAGNOSIS — I359 Nonrheumatic aortic valve disorder, unspecified: Secondary | ICD-10-CM

## 2014-04-10 LAB — BLOOD GAS, ARTERIAL
Acid-Base Excess: 0.1 mmol/L (ref 0.0–2.0)
Bicarbonate: 24.5 mEq/L — ABNORMAL HIGH (ref 20.0–24.0)
Drawn by: 369891
FIO2: 0.21 %
O2 Saturation: 97.5 %
Patient temperature: 98.6
TCO2: 25.8 mmol/L (ref 0–100)
pCO2 arterial: 41.9 mmHg (ref 35.0–45.0)
pH, Arterial: 7.386 (ref 7.350–7.450)
pO2, Arterial: 94.3 mmHg (ref 80.0–100.0)

## 2014-04-10 LAB — CBC
HEMATOCRIT: 39.8 % (ref 39.0–52.0)
Hemoglobin: 13.7 g/dL (ref 13.0–17.0)
MCH: 31.5 pg (ref 26.0–34.0)
MCHC: 34.4 g/dL (ref 30.0–36.0)
MCV: 91.5 fL (ref 78.0–100.0)
Platelets: 102 10*3/uL — ABNORMAL LOW (ref 150–400)
RBC: 4.35 MIL/uL (ref 4.22–5.81)
RDW: 13.8 % (ref 11.5–15.5)
WBC: 4.8 10*3/uL (ref 4.0–10.5)

## 2014-04-10 LAB — URINALYSIS, ROUTINE W REFLEX MICROSCOPIC
Bilirubin Urine: NEGATIVE
Glucose, UA: NEGATIVE mg/dL
Hgb urine dipstick: NEGATIVE
Ketones, ur: NEGATIVE mg/dL
Leukocytes, UA: NEGATIVE
Nitrite: NEGATIVE
Protein, ur: NEGATIVE mg/dL
Specific Gravity, Urine: 1.006 (ref 1.005–1.030)
Urobilinogen, UA: 0.2 mg/dL (ref 0.0–1.0)
pH: 7 (ref 5.0–8.0)

## 2014-04-10 LAB — SURGICAL PCR SCREEN
MRSA, PCR: NEGATIVE
Staphylococcus aureus: NEGATIVE

## 2014-04-10 MED ORDER — ENSURE PUDDING PO PUDG
1.0000 | Freq: Three times a day (TID) | ORAL | Status: DC
  Administered 2014-04-10 – 2014-04-11 (×4): 1 via ORAL
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
## 2014-04-10 NOTE — Progress Notes (Signed)
ABG    Component Value Date/Time   PHART 7.386 04/10/2014 0455   PCO2ART 41.9 04/10/2014 0455   PO2ART 94.3 04/10/2014 0455   HCO3 24.5* 04/10/2014 0455   TCO2 25.8 04/10/2014 0455   O2SAT 97.5 04/10/2014 0455

## 2014-04-10 NOTE — Progress Notes (Signed)
       Patient Name: [name] Date of Encounter: 04/10/2014    SUBJECTIVE: 73 yo with AS, LV systolic dysfunction and chest pain. Presently resting comfortably and eating.  Denies CP or SOB.  TELEMETRY:  Sinus brady with PVC;s: Filed Vitals:   04/09/14 2236 04/09/14 2322 04/10/14 0342 04/10/14 0800  BP: 99/59 99/67 112/60 125/75  Pulse:  86 72 82  Temp:  98.4 F (36.9 C) 97.8 F (36.6 C) 98 F (36.7 C)  TempSrc:  Oral Oral Oral  Resp: 15 15 18 17   Height:      Weight:      SpO2: 99% 100% 95% 98%    Intake/Output Summary (Last 24 hours) at 04/10/14 1249 Last data filed at 04/10/14 0900  Gross per 24 hour  Intake 1200.43 ml  Output    750 ml  Net 450.43 ml   LABS: Basic Metabolic Panel:  Recent Labs  04/08/14 1301 04/09/14 0048  NA 141 139  K 4.0 3.9  CL 100 101  CO2 29 28  GLUCOSE 86 122*  BUN 16 18  CREATININE 0.85 0.92  CALCIUM 10.0 9.2   CBC:  Recent Labs  04/08/14 1301 04/09/14 0512 04/10/14 0247  WBC 5.9 5.1 4.8  NEUTROABS 3.5  --   --   HGB 14.8 14.0 13.7  HCT 43.1 40.6 39.8  MCV 92.3 91.2 91.5  PLT 115* 107* 102*   Cardiac Enzymes:  Recent Labs  04/08/14 1301  TROPONINI <0.30   BNP No results found for this basename: probnp    Radiology/Studies:  No CHF  Physical Exam: Blood pressure 125/75, pulse 82, temperature 98 F (36.7 C), temperature source Oral, resp. rate 17, height 5\' 8"  (1.727 m), weight 139 lb 8.8 oz (63.3 kg), SpO2 98.00%. Weight change: -2 lb 7.2 oz (-1.111 kg)  Wt Readings from Last 3 Encounters:  04/09/14 139 lb 8.8 oz (63.3 kg)  04/09/14 139 lb 8.8 oz (63.3 kg)  04/01/14 142 lb (64.411 kg)   Alert, NAD OP clear Neck supple Lungs are clear. CV- Brady with 3/6 systolic murmur which is late peaking GI- soft, NT Extr- no c/c/e Good radial and femoral pulses, although 1 + on right Neuro- strength/ sensation are intact  ASSESSMENT:  1. Severe aortic stenosis 2. Chronic systolic HF  Plan:  Plans  for surgery 9/8 are noted.  Continue current medicine strategy at this time.  SignedThompson Grayer 04/10/2014, 12:49 PM

## 2014-04-10 NOTE — Progress Notes (Signed)
Pre-op Cardiac Surgery  Carotid Findings:  Recent study from 03/09/2014, 60-79% bilateral internal carotid artery stenosis.   Upper Extremity Right Left  Brachial Pressures Triphasic, unable to assess pressure due to cardiac catheterization. 115-Triphasic  Radial Waveforms Triphasic Triphasic  Ulnar Waveforms Triphasic Triphasic  Palmar Arch (Allen's Test) Signal obliterates with radial compression, decreases <50% with ulnar compression. Signal obliterates with radial compression, is unaffected with ulnar compression.   04/10/2014 2:11 PM Maudry Mayhew, RVT, RDCS, RDMS

## 2014-04-11 LAB — CBC
HCT: 42.1 % (ref 39.0–52.0)
HEMOGLOBIN: 14.3 g/dL (ref 13.0–17.0)
MCH: 31.8 pg (ref 26.0–34.0)
MCHC: 34 g/dL (ref 30.0–36.0)
MCV: 93.6 fL (ref 78.0–100.0)
Platelets: 102 10*3/uL — ABNORMAL LOW (ref 150–400)
RBC: 4.5 MIL/uL (ref 4.22–5.81)
RDW: 14 % (ref 11.5–15.5)
WBC: 5.1 10*3/uL (ref 4.0–10.5)

## 2014-04-11 MED ORDER — LACTULOSE 10 GM/15ML PO SOLN
20.0000 g | Freq: Every day | ORAL | Status: DC
  Administered 2014-04-11: 20 g via ORAL
  Filled 2014-04-11: qty 30

## 2014-04-11 MED ORDER — DIAZEPAM 5 MG PO TABS: 5.0000 mg | ORAL_TABLET | ORAL | Status: DC | PRN

## 2014-04-11 NOTE — Progress Notes (Signed)
       Patient Name: [name] Date of Encounter: 04/11/2014    SUBJECTIVE: 73 yo with AS, LV systolic dysfunction and chest pain. Presently resting comfortably.  Denies CP or SOB.  TELEMETRY:  Sinus brady with PVC;s: Filed Vitals:   04/11/14 0500 04/11/14 0700 04/11/14 0810 04/11/14 0824  BP:   134/61   Pulse:      Temp:      TempSrc:      Resp: 17 16  19   Height:      Weight:      SpO2:    95%    Intake/Output Summary (Last 24 hours) at 04/11/14 1117 Last data filed at 04/11/14 0800  Gross per 24 hour  Intake    510 ml  Output   1900 ml  Net  -1390 ml   LABS: Basic Metabolic Panel:  Recent Labs  04/08/14 1301 04/09/14 0048  NA 141 139  K 4.0 3.9  CL 100 101  CO2 29 28  GLUCOSE 86 122*  BUN 16 18  CREATININE 0.85 0.92  CALCIUM 10.0 9.2   CBC:  Recent Labs  04/08/14 1301  04/10/14 0247 04/11/14 0255  WBC 5.9  < > 4.8 5.1  NEUTROABS 3.5  --   --   --   HGB 14.8  < > 13.7 14.3  HCT 43.1  < > 39.8 42.1  MCV 92.3  < > 91.5 93.6  PLT 115*  < > 102* 102*  < > = values in this interval not displayed. Cardiac Enzymes:  Recent Labs  04/08/14 1301  TROPONINI <0.30   BNP No results found for this basename: probnp    Radiology/Studies:  No CHF  Physical Exam: Blood pressure 134/61, pulse 84, temperature 97.5 F (36.4 C), temperature source Oral, resp. rate 19, height 5\' 8"  (1.727 m), weight 137 lb 9.6 oz (62.415 kg), SpO2 95.00%. Weight change: -1 lb 15.2 oz (-0.885 kg)  Wt Readings from Last 3 Encounters:  04/11/14 137 lb 9.6 oz (62.415 kg)  04/11/14 137 lb 9.6 oz (62.415 kg)  04/01/14 142 lb (64.411 kg)   Alert, NAD OP clear Neck supple Lungs are clear. CV- Brady with 3/6 systolic murmur which is late peaking GI- soft, NT Extr- no c/c/e Good radial and femoral pulses, although 1 + on right Neuro- strength/ sensation are intact  ASSESSMENT:  1. Severe aortic stenosis 2. Chronic systolic HF  Plan:  Plans for surgery 9/8.   Continue current medicine strategy at this time.  I have discussed with Dr Prescott Gum this am.  We agree that he should remain in the hospital until his surgery.  Transfer to telemetry today.  Signed, Thompson Grayer 04/11/2014, 11:17 AM

## 2014-04-11 NOTE — Progress Notes (Signed)
2 Days Post-Op Procedure(s) (LRB): LEFT AND RIGHT HEART CATHETERIZATION WITH CORONARY ANGIOGRAM (N/A) Subjective:  Nonischemic cardiomyopathy with severe AS No cest pain overnight  Objective: Vital signs in last 24 hours: Temp:  [97.5 F (36.4 C)-98.4 F (36.9 C)] 97.5 F (36.4 C) (09/06 0300) Pulse Rate:  [62-89] 84 (09/06 0300) Cardiac Rhythm:  [-] Normal sinus rhythm (09/06 0824) Resp:  [13-20] 19 (09/06 0824) BP: (113-139)/(46-79) 134/61 mmHg (09/06 0810) SpO2:  [94 %-97 %] 95 % (09/06 0824) Weight:  [137 lb 9.6 oz (62.415 kg)] 137 lb 9.6 oz (62.415 kg) (09/06 0300)  Hemodynamic parameters for last 24 hours:  nsr with PVC's  Intake/Output from previous day: 09/05 0701 - 09/06 0700 In: 630 [P.O.:630] Out: 1850 [Urine:1850] Intake/Output this shift: Total I/O In: -  Out: 500 [Urine:500]  No hematoma No edema Lungs clear Lab Results:  Recent Labs  04/10/14 0247 04/11/14 0255  WBC 4.8 5.1  HGB 13.7 14.3  HCT 39.8 42.1  PLT 102* 102*   BMET:  Recent Labs  04/08/14 1301 04/09/14 0048  NA 141 139  K 4.0 3.9  CL 100 101  CO2 29 28  GLUCOSE 86 122*  BUN 16 18  CREATININE 0.85 0.92  CALCIUM 10.0 9.2    PT/INR:  Recent Labs  04/09/14 0512  LABPROT 15.5*  INR 1.23   ABG    Component Value Date/Time   PHART 7.386 04/10/2014 0455   HCO3 24.5* 04/10/2014 0455   TCO2 25.8 04/10/2014 0455   O2SAT 97.5 04/10/2014 0455   CBG (last 3)   Recent Labs  04/09/14 1135 04/09/14 1525 04/09/14 2122  GLUCAP 102* 80 191*    Assessment/Plan: S/P Procedure(s) (LRB): LEFT AND RIGHT HEART CATHETERIZATION WITH CORONARY ANGIOGRAM (N/A) AVR with tissue valve Tues am 9-8 Centrimag backup for EF .25 Procedure and risks d/w patient  LOS: 3 days    [name],Elonna Mcfarlane 04/11/2014

## 2014-04-12 LAB — CBC
HCT: 43.7 % (ref 39.0–52.0)
HCT: 48.1 % (ref 39.0–52.0)
HEMOGLOBIN: 14.8 g/dL (ref 13.0–17.0)
Hemoglobin: 16.2 g/dL (ref 13.0–17.0)
MCH: 31.7 pg (ref 26.0–34.0)
MCH: 31.8 pg (ref 26.0–34.0)
MCHC: 33.9 g/dL (ref 30.0–36.0)
MCHC: 33.7 g/dL (ref 30.0–36.0)
MCV: 93.6 fL (ref 78.0–100.0)
MCV: 94.5 fL (ref 78.0–100.0)
Platelets: 107 10*3/uL — ABNORMAL LOW (ref 150–400)
Platelets: 112 10*3/uL — ABNORMAL LOW (ref 150–400)
RBC: 4.67 MIL/uL (ref 4.22–5.81)
RBC: 5.09 MIL/uL (ref 4.22–5.81)
RDW: 13.9 % (ref 11.5–15.5)
RDW: 13.9 % (ref 11.5–15.5)
WBC: 6 10*3/uL (ref 4.0–10.5)
WBC: 6 10*3/uL (ref 4.0–10.5)

## 2014-04-12 LAB — ABO/RH: ABO/RH(D): A POS

## 2014-04-12 LAB — BASIC METABOLIC PANEL
Anion gap: 10 (ref 5–15)
BUN: 16 mg/dL (ref 6–23)
CO2: 30 mEq/L (ref 19–32)
Calcium: 9.7 mg/dL (ref 8.4–10.5)
Chloride: 98 mEq/L (ref 96–112)
Creatinine, Ser: 0.83 mg/dL (ref 0.50–1.35)
GFR calc Af Amer: >90 mL/min (ref >90)
GFR calc non Af Amer: 85 mL/min — ABNORMAL LOW (ref >90)
Glucose, Bld: 178 mg/dL — ABNORMAL HIGH (ref 70–99)
Potassium: 4.4 mEq/L (ref 3.7–5.3)
Sodium: 138 mEq/L (ref 137–147)

## 2014-04-12 LAB — PREPARE RBC (CROSSMATCH)

## 2014-04-12 MED ORDER — CHLORHEXIDINE GLUCONATE 4 % EX LIQD
60.0000 mL | Freq: Once | CUTANEOUS | Status: AC
  Administered 2014-04-12: 4 via TOPICAL

## 2014-04-12 MED ORDER — METOPROLOL TARTRATE 12.5 MG HALF TABLET
12.5000 mg | ORAL_TABLET | Freq: Once | ORAL | Status: AC
  Administered 2014-04-13: 12.5 mg via ORAL
  Filled 2014-04-12: qty 1

## 2014-04-12 MED ORDER — PHENYLEPHRINE HCL 10 MG/ML IJ SOLN
30.0000 ug/min | INTRAVENOUS | Status: DC
  Filled 2014-04-12: qty 2

## 2014-04-12 MED ORDER — PLASMA-LYTE 148 IV SOLN
INTRAVENOUS | Status: AC
  Administered 2014-04-13: 10:00:00
  Filled 2014-04-12: qty 2.5

## 2014-04-12 MED ORDER — EPINEPHRINE HCL 1 MG/ML IJ SOLN
0.5000 ug/min | INTRAVENOUS | Status: DC
  Filled 2014-04-12: qty 4

## 2014-04-12 MED ORDER — SODIUM CHLORIDE 0.9 % IV SOLN
INTRAVENOUS | Status: AC
  Administered 2014-04-13: 70 mL/h via INTRAVENOUS
  Filled 2014-04-12: qty 40

## 2014-04-12 MED ORDER — DEXMEDETOMIDINE HCL IN NACL 400 MCG/100ML IV SOLN
0.1000 ug/kg/h | INTRAVENOUS | Status: AC
  Administered 2014-04-13: 0.2 ug/kg/h via INTRAVENOUS
  Filled 2014-04-12: qty 100

## 2014-04-12 MED ORDER — BISACODYL 5 MG PO TBEC
5.0000 mg | DELAYED_RELEASE_TABLET | Freq: Once | ORAL | Status: AC
  Administered 2014-04-12: 5 mg via ORAL
  Filled 2014-04-12: qty 1

## 2014-04-12 MED ORDER — SODIUM CHLORIDE 0.9 % IV SOLN
INTRAVENOUS | Status: AC
  Administered 2014-04-13: 2.3 [IU]/h via INTRAVENOUS
  Administered 2014-04-13: 1 [IU]/h via INTRAVENOUS
  Administered 2014-04-13: 2.4 [IU]/h via INTRAVENOUS
  Filled 2014-04-12: qty 2.5

## 2014-04-12 MED ORDER — POTASSIUM CHLORIDE 2 MEQ/ML IV SOLN
80.0000 meq | INTRAVENOUS | Status: DC
  Filled 2014-04-12: qty 40

## 2014-04-12 MED ORDER — TEMAZEPAM 15 MG PO CAPS: 15.0000 mg | ORAL_CAPSULE | Freq: Once | ORAL | Status: AC | PRN

## 2014-04-12 MED ORDER — DOPAMINE-DEXTROSE 3.2-5 MG/ML-% IV SOLN
2.0000 ug/kg/min | INTRAVENOUS | Status: AC
  Administered 2014-04-13: 3 ug/kg/min via INTRAVENOUS
  Filled 2014-04-12: qty 250

## 2014-04-12 MED ORDER — MAGNESIUM SULFATE 50 % IJ SOLN
40.0000 meq | INTRAMUSCULAR | Status: DC
  Filled 2014-04-12: qty 10

## 2014-04-12 MED ORDER — DEXTROSE 5 % IV SOLN
1.5000 g | INTRAVENOUS | Status: AC
  Administered 2014-04-13: .75 g via INTRAVENOUS
  Administered 2014-04-13: 1.5 g via INTRAVENOUS
  Filled 2014-04-12: qty 1.5

## 2014-04-12 MED ORDER — SODIUM CHLORIDE 0.9 % IV SOLN
INTRAVENOUS | Status: DC
  Filled 2014-04-12: qty 30

## 2014-04-12 MED ORDER — CHLORHEXIDINE GLUCONATE 4 % EX LIQD
60.0000 mL | Freq: Once | CUTANEOUS | Status: AC
  Administered 2014-04-13: 4 via TOPICAL
  Filled 2014-04-12: qty 60

## 2014-04-12 MED ORDER — DEXTROSE 5 % IV SOLN
750.0000 mg | INTRAVENOUS | Status: DC
  Filled 2014-04-12: qty 750

## 2014-04-12 MED ORDER — DIAZEPAM 5 MG PO TABS
5.0000 mg | ORAL_TABLET | Freq: Once | ORAL | Status: AC
  Administered 2014-04-13: 5 mg via ORAL
  Filled 2014-04-12: qty 1

## 2014-04-12 MED ORDER — VANCOMYCIN HCL 10 G IV SOLR
1250.0000 mg | INTRAVENOUS | Status: AC
  Administered 2014-04-13: 1250 mg via INTRAVENOUS
  Filled 2014-04-12: qty 1250

## 2014-04-12 MED ORDER — NITROGLYCERIN IN D5W 200-5 MCG/ML-% IV SOLN
2.0000 ug/min | INTRAVENOUS | Status: AC
  Administered 2014-04-13: 16.6 ug/min via INTRAVENOUS
  Filled 2014-04-12: qty 250

## 2014-04-12 NOTE — Progress Notes (Signed)
UR Completed Nimesh Riolo Graves-Bigelow, RN,BSN [phone]  

## 2014-04-12 NOTE — Progress Notes (Signed)
3 Days Post-Op Procedure(s) (LRB): LEFT AND RIGHT HEART CATHETERIZATION WITH CORONARY ANGIOGRAM (N/A) Subjective: No chest pain or shortness of breath Sinus rhythm with PVCs Preoperative labs are satisfactory Aortic valve replacement plan 4 AM-this has been discussed in detail with patient including benefits risks and alternatives.  Objective: Vital signs in last 24 hours: Temp:  [97.8 F (36.6 C)-98.2 F (36.8 C)] 98.2 F (36.8 C) (09/07 1400) Pulse Rate:  [55-79] 79 (09/07 1400) Cardiac Rhythm:  [-] Normal sinus rhythm (09/07 0728) Resp:  [16-20] 20 (09/07 1400) BP: (95-117)/(67-84) 117/84 mmHg (09/07 1400) SpO2:  [95 %-100 %] 100 % (09/07 1400)  Hemodynamic parameters for last 24 hours:   stable  Intake/Output from previous day: 09/06 0701 - 09/07 0700 In: -  Out: 5885 [Urine:1425] Intake/Output this shift: Total I/O In: 240 [P.O.:240] Out: 200 [Urine:200]  Lungs clear Extremities warm Ecchymoses in the right antecubital fossa from right heart cath  Lab Results:  Recent Labs  04/12/14 0238 04/12/14 0855  WBC 6.0 6.0  HGB 14.8 16.2  HCT 43.7 48.1  PLT 107* 112*   BMET:  Recent Labs  04/12/14 0855  NA 138  K 4.4  CL 98  CO2 30  GLUCOSE 178*  BUN 16  CREATININE 0.83  CALCIUM 9.7    PT/INR: No results found for this basename: LABPROT, INR,  in the last 72 hours ABG    Component Value Date/Time   PHART 7.386 04/10/2014 0455   HCO3 24.5* 04/10/2014 0455   TCO2 25.8 04/10/2014 0455   O2SAT 97.5 04/10/2014 0455   CBG (last 3)   Recent Labs  04/09/14 2122  GLUCAP 191*    Assessment/Plan: S/P Procedure(s) (LRB): LEFT AND RIGHT HEART CATHETERIZATION WITH CORONARY ANGIOGRAM (N/A) Severe aortic stenosis with cardiomyopathy Plan aVR in a.m.   LOS: 4 days    [name],Renia Mikelson 04/12/2014

## 2014-04-12 NOTE — Care Management Note (Addendum)
    Page 1 of 2   04/20/2014     11:51:47 AM CARE MANAGEMENT NOTE 04/20/2014  Patient:  [name], [name]   Account Number:  [id number]  Date Initiated:  04/12/2014  Documentation initiated by:  GRAVES-BIGELOW,BRENDA  Subjective/Objective Assessment:   Pt admitted for chest pain. S/p cath 04-09-14- revealed Severe aortic stenosis with calculated valve area. Plan for AVR 04-13-14.     Action/Plan:   CM to continue to monitor for disposition needs.   Anticipated DC Date:  04/19/2014   Anticipated DC Plan:  [place]  CM consult      Choice offered to / List presented to:     DME arranged  [place]      DME agency  [place].        Status of service:  Completed, signed off Medicare Important Message given?  YES (If response is "NO", the following Medicare IM given date fields will be blank) Date Medicare IM given:  04/12/2014 Medicare IM given by:  GRAVES-BIGELOW,BRENDA Date Additional Medicare IM given:  04/19/2014 Additional Medicare IM given by:  Danzig Macgregor  Discharge Disposition:  HOME/SELF CARE  Per UR Regulation:  Reviewed for med. necessity/level of care/duration of stay  If discussed at [place] of Stay Meetings, dates discussed:    Comments:  ContactAmjad, Fikes Spouse [phone]   [phone]                 Obar,[name] Daughter [phone]  04/20/14 Ellan Lambert, RN, BSN [phone] Pt requests shower chair for home.  Referral to [facility] for DME needs.  Pt aware that shower chair not covered by insurance.  04/19/14 Ellan Lambert, RN, BSN [phone] Pt for poss dc later today.  Pt requests RW for home; referral to [facility] for DME needs.  04-14-14 10:50am [place] [phone] Post op AVR - extubated this am.

## 2014-04-12 NOTE — Progress Notes (Signed)
Patient ID: BAKER MORONTA, male   DOB: 10-21-1940, 73 y.o.   MRN: [id number]       Patient Name: [name] Date of Encounter: 04/12/2014    SUBJECTIVE: 73 yo with AS, LV systolic dysfunction and chest pain. For AVR Tuesday with PVT   TELEMETRY:  Sinus brady with PVC;s: Filed Vitals:   04/11/14 1230 04/11/14 1636 04/11/14 2100 04/12/14 0500  BP: 131/55 128/60 95/67 110/70  Pulse:   55 76  Temp: 97.7 F (36.5 C) 97.5 F (36.4 C) 98.1 F (36.7 C) 97.8 F (36.6 C)  TempSrc: Oral Oral  Oral  Resp: 17 18 16 18   Height:      Weight:      SpO2: 96% 99% 95% 100%    Intake/Output Summary (Last 24 hours) at 04/12/14 0916 Last data filed at 04/12/14 0500  Gross per 24 hour  Intake      0 ml  Output    925 ml  Net   -925 ml   LABS: Basic Metabolic Panel: No results found for this basename: NA, K, CL, CO2, GLUCOSE, BUN, CREATININE, CALCIUM, MG, PHOS,  in the last 72 hours CBC:  Recent Labs  04/11/14 0255 04/12/14 0238  WBC 5.1 6.0  HGB 14.3 14.8  HCT 42.1 43.7  MCV 93.6 93.6  PLT 102* 107*     Radiology/Studies:  No CHF  Physical Exam: Blood pressure 110/70, pulse 76, temperature 97.8 F (36.6 C), temperature source Oral, resp. rate 18, height 5\' 8"  (1.727 m), weight 137 lb 9.6 oz (62.415 kg), SpO2 100.00%. Weight change:   Wt Readings from Last 3 Encounters:  04/11/14 137 lb 9.6 oz (62.415 kg)  04/11/14 137 lb 9.6 oz (62.415 kg)  04/01/14 142 lb (64.411 kg)   Alert, NAD OP clear Neck supple Lungs are clear. CV- Brady with 3/6 systolic murmur which is late peaking GI- soft, NT Extr- no c/c/e Good radial and femoral pulses, although 1 + on right Neuro- strength/ sensation are intact  ASSESSMENT:  1. Severe aortic stenosis 2. Chronic systolic HF  Plan:  Plans for surgery 9/8.  Continue current medicine strategy at this time. Hopefully functional limitations and EF will improve after AVR  No need for CABG no significant CAD Will need post op echo for  valve function and serial for EF   Rozann Lesches 04/12/2014, 9:16 AM

## 2014-04-13 ENCOUNTER — Inpatient Hospital Stay (HOSPITAL_COMMUNITY): Payer: Medicare Other

## 2014-04-13 ENCOUNTER — Encounter (HOSPITAL_COMMUNITY): Payer: Medicare Other | Admitting: Certified Registered"

## 2014-04-13 ENCOUNTER — Encounter (HOSPITAL_COMMUNITY): Payer: Self-pay | Admitting: Anesthesiology

## 2014-04-13 ENCOUNTER — Encounter (HOSPITAL_COMMUNITY)
Admission: EM | Disposition: A | Discharge status: Home / Self Care | Payer: Medicare Other | Source: Home / Self Care | Attending: Cardiothoracic Surgery

## 2014-04-13 ENCOUNTER — Inpatient Hospital Stay (HOSPITAL_COMMUNITY): Payer: Medicare Other | Admitting: Certified Registered"

## 2014-04-13 DIAGNOSIS — Z952 Presence of prosthetic heart valve: Secondary | ICD-10-CM

## 2014-04-13 DIAGNOSIS — I359 Nonrheumatic aortic valve disorder, unspecified: Secondary | ICD-10-CM

## 2014-04-13 HISTORY — PX: AORTIC VALVE REPLACEMENT: SHX41

## 2014-04-13 LAB — POCT I-STAT 3, ART BLOOD GAS (G3+)
Acid-base deficit: 4 mmol/L — ABNORMAL HIGH (ref 0.0–2.0)
BICARBONATE: 25.1 meq/L — AB (ref 20.0–24.0)
Bicarbonate: 23.9 mEq/L (ref 20.0–24.0)
Bicarbonate: 24 mEq/L (ref 20.0–24.0)
Bicarbonate: 20.3 mEq/L (ref 20.0–24.0)
O2 SAT: 91 %
O2 SAT: 97 %
O2 Saturation: 100 %
O2 Saturation: 100 %
PH ART: 7.379 (ref 7.350–7.450)
PH ART: 7.466 — AB (ref 7.350–7.450)
PO2 ART: 63 mmHg — AB (ref 80.0–100.0)
PO2 ART: 417 mmHg — AB (ref 80.0–100.0)
PO2 ART: 84 mmHg (ref 80.0–100.0)
Patient temperature: 35.7
TCO2: 26 mmol/L (ref 0–100)
TCO2: 25 mmol/L (ref 0–100)
TCO2: 25 mmol/L (ref 0–100)
TCO2: 21 mmol/L (ref 0–100)
pCO2 arterial: 42.5 mmHg (ref 35.0–45.0)
pCO2 arterial: 33.1 mmHg — ABNORMAL LOW (ref 35.0–45.0)
pCO2 arterial: 36.5 mmHg (ref 35.0–45.0)
pCO2 arterial: 32 mmHg — ABNORMAL LOW (ref 35.0–45.0)
pH, Arterial: 7.426 (ref 7.350–7.450)
pH, Arterial: 7.404 (ref 7.350–7.450)
pO2, Arterial: 363 mmHg — ABNORMAL HIGH (ref 80.0–100.0)

## 2014-04-13 LAB — POCT I-STAT, CHEM 8
BUN: 15 mg/dL (ref 6–23)
BUN: 12 mg/dL (ref 6–23)
BUN: 13 mg/dL (ref 6–23)
BUN: 13 mg/dL (ref 6–23)
BUN: 13 mg/dL (ref 6–23)
BUN: 11 mg/dL (ref 6–23)
CALCIUM ION: 1.23 mmol/L (ref 1.13–1.30)
CALCIUM ION: 1.2 mmol/L (ref 1.13–1.30)
CALCIUM ION: 1.06 mmol/L — AB (ref 1.13–1.30)
CALCIUM ION: 1.39 mmol/L — AB (ref 1.13–1.30)
CALCIUM ION: 1.33 mmol/L — AB (ref 1.13–1.30)
CHLORIDE: 104 meq/L (ref 96–112)
CHLORIDE: 99 meq/L (ref 96–112)
CHLORIDE: 97 meq/L (ref 96–112)
CHLORIDE: 107 meq/L (ref 96–112)
CREATININE: 0.5 mg/dL (ref 0.50–1.35)
CREATININE: 0.6 mg/dL (ref 0.50–1.35)
Calcium, Ion: 1.01 mmol/L — ABNORMAL LOW (ref 1.13–1.30)
Chloride: 103 mEq/L (ref 96–112)
Chloride: 99 mEq/L (ref 96–112)
Creatinine, Ser: 0.5 mg/dL (ref 0.50–1.35)
Creatinine, Ser: 0.5 mg/dL (ref 0.50–1.35)
Creatinine, Ser: 0.6 mg/dL (ref 0.50–1.35)
Creatinine, Ser: 0.8 mg/dL (ref 0.50–1.35)
GLUCOSE: 121 mg/dL — AB (ref 70–99)
GLUCOSE: 145 mg/dL — AB (ref 70–99)
Glucose, Bld: 136 mg/dL — ABNORMAL HIGH (ref 70–99)
Glucose, Bld: 139 mg/dL — ABNORMAL HIGH (ref 70–99)
Glucose, Bld: 212 mg/dL — ABNORMAL HIGH (ref 70–99)
Glucose, Bld: 84 mg/dL (ref 70–99)
HCT: 29 % — ABNORMAL LOW (ref 39.0–52.0)
HCT: 29 % — ABNORMAL LOW (ref 39.0–52.0)
HCT: 26 % — ABNORMAL LOW (ref 39.0–52.0)
HEMATOCRIT: 44 % (ref 39.0–52.0)
HEMATOCRIT: 40 % (ref 39.0–52.0)
HEMATOCRIT: 24 % — AB (ref 39.0–52.0)
HEMOGLOBIN: 15 g/dL (ref 13.0–17.0)
HEMOGLOBIN: 13.6 g/dL (ref 13.0–17.0)
Hemoglobin: 9.9 g/dL — ABNORMAL LOW (ref 13.0–17.0)
Hemoglobin: 9.9 g/dL — ABNORMAL LOW (ref 13.0–17.0)
Hemoglobin: 8.8 g/dL — ABNORMAL LOW (ref 13.0–17.0)
Hemoglobin: 8.2 g/dL — ABNORMAL LOW (ref 13.0–17.0)
POTASSIUM: 4.1 meq/L (ref 3.7–5.3)
POTASSIUM: 4.1 meq/L (ref 3.7–5.3)
POTASSIUM: 5.2 meq/L (ref 3.7–5.3)
POTASSIUM: 3.8 meq/L (ref 3.7–5.3)
Potassium: 4.2 mEq/L (ref 3.7–5.3)
Potassium: 4.6 mEq/L (ref 3.7–5.3)
SODIUM: 137 meq/L (ref 137–147)
SODIUM: 130 meq/L — AB (ref 137–147)
Sodium: 135 mEq/L — ABNORMAL LOW (ref 137–147)
Sodium: 135 mEq/L — ABNORMAL LOW (ref 137–147)
Sodium: 130 mEq/L — ABNORMAL LOW (ref 137–147)
Sodium: 139 mEq/L (ref 137–147)
TCO2: 25 mmol/L (ref 0–100)
TCO2: 24 mmol/L (ref 0–100)
TCO2: 27 mmol/L (ref 0–100)
TCO2: 23 mmol/L (ref 0–100)
TCO2: 23 mmol/L (ref 0–100)
TCO2: 22 mmol/L (ref 0–100)

## 2014-04-13 LAB — CBC
HCT: 35.5 % — ABNORMAL LOW (ref 39.0–52.0)
HCT: 25.1 % — ABNORMAL LOW (ref 39.0–52.0)
HEMATOCRIT: 46.9 % (ref 39.0–52.0)
HEMOGLOBIN: 15.9 g/dL (ref 13.0–17.0)
HEMOGLOBIN: 12.1 g/dL — AB (ref 13.0–17.0)
Hemoglobin: 8.7 g/dL — ABNORMAL LOW (ref 13.0–17.0)
MCH: 31.5 pg (ref 26.0–34.0)
MCH: 31.5 pg (ref 26.0–34.0)
MCH: 31.9 pg (ref 26.0–34.0)
MCHC: 33.9 g/dL (ref 30.0–36.0)
MCHC: 34.1 g/dL (ref 30.0–36.0)
MCHC: 34.7 g/dL (ref 30.0–36.0)
MCV: 93.1 fL (ref 78.0–100.0)
MCV: 92.4 fL (ref 78.0–100.0)
MCV: 91.9 fL (ref 78.0–100.0)
Platelets: 101 10*3/uL — ABNORMAL LOW (ref 150–400)
Platelets: 87 10*3/uL — ABNORMAL LOW (ref 150–400)
Platelets: 85 10*3/uL — ABNORMAL LOW (ref 150–400)
RBC: 5.04 MIL/uL (ref 4.22–5.81)
RBC: 3.84 MIL/uL — AB (ref 4.22–5.81)
RBC: 2.73 MIL/uL — ABNORMAL LOW (ref 4.22–5.81)
RDW: 14 % (ref 11.5–15.5)
RDW: 14 % (ref 11.5–15.5)
RDW: 14 % (ref 11.5–15.5)
WBC: 9.8 10*3/uL (ref 4.0–10.5)
WBC: 12.4 10*3/uL — ABNORMAL HIGH (ref 4.0–10.5)
WBC: 6.1 10*3/uL (ref 4.0–10.5)

## 2014-04-13 LAB — POCT I-STAT 3, VENOUS BLOOD GAS (G3P V)
Acid-Base Excess: 1 mmol/L (ref 0.0–2.0)
BICARBONATE: 26 meq/L — AB (ref 20.0–24.0)
O2 Saturation: 70 %
PCO2 VEN: 42.6 mmHg — AB (ref 45.0–50.0)
PH VEN: 7.393 — AB (ref 7.250–7.300)
PO2 VEN: 37 mmHg (ref 30.0–45.0)
TCO2: 27 mmol/L (ref 0–100)

## 2014-04-13 LAB — PLATELET COUNT: Platelets: 98 10*3/uL — ABNORMAL LOW (ref 150–400)

## 2014-04-13 LAB — GLUCOSE, CAPILLARY
GLUCOSE-CAPILLARY: 154 mg/dL — AB (ref 70–99)
GLUCOSE-CAPILLARY: 133 mg/dL — AB (ref 70–99)
GLUCOSE-CAPILLARY: 94 mg/dL (ref 70–99)
GLUCOSE-CAPILLARY: 113 mg/dL — AB (ref 70–99)
GLUCOSE-CAPILLARY: 111 mg/dL — AB (ref 70–99)
GLUCOSE-CAPILLARY: 119 mg/dL — AB (ref 70–99)
Glucose-Capillary: 106 mg/dL — ABNORMAL HIGH (ref 70–99)
Glucose-Capillary: 103 mg/dL — ABNORMAL HIGH (ref 70–99)
Glucose-Capillary: 142 mg/dL — ABNORMAL HIGH (ref 70–99)
Glucose-Capillary: 103 mg/dL — ABNORMAL HIGH (ref 70–99)
Glucose-Capillary: 190 mg/dL — ABNORMAL HIGH (ref 70–99)
Glucose-Capillary: 116 mg/dL — ABNORMAL HIGH (ref 70–99)
Glucose-Capillary: 140 mg/dL — ABNORMAL HIGH (ref 70–99)
Glucose-Capillary: 145 mg/dL — ABNORMAL HIGH (ref 70–99)
Glucose-Capillary: 128 mg/dL — ABNORMAL HIGH (ref 70–99)
Glucose-Capillary: 132 mg/dL — ABNORMAL HIGH (ref 70–99)
Glucose-Capillary: 92 mg/dL (ref 70–99)
Glucose-Capillary: 88 mg/dL (ref 70–99)
Glucose-Capillary: 140 mg/dL — ABNORMAL HIGH (ref 70–99)
Glucose-Capillary: 106 mg/dL — ABNORMAL HIGH (ref 70–99)

## 2014-04-13 LAB — POCT ACTIVATED CLOTTING TIME
Activated Clotting Time: 259 seconds
Activated Clotting Time: 219 seconds
Activated Clotting Time: 191 seconds

## 2014-04-13 LAB — POCT I-STAT 4, (NA,K, GLUC, HGB,HCT)
Glucose, Bld: 171 mg/dL — ABNORMAL HIGH (ref 70–99)
HEMATOCRIT: 33 % — AB (ref 39.0–52.0)
Hemoglobin: 11.2 g/dL — ABNORMAL LOW (ref 13.0–17.0)
Potassium: 4 mEq/L (ref 3.7–5.3)
Sodium: 133 mEq/L — ABNORMAL LOW (ref 137–147)

## 2014-04-13 LAB — HEMOGLOBIN AND HEMATOCRIT, BLOOD
HCT: 29.1 % — ABNORMAL LOW (ref 39.0–52.0)
Hemoglobin: 10.2 g/dL — ABNORMAL LOW (ref 13.0–17.0)

## 2014-04-13 LAB — PROTIME-INR
INR: 1.82 — ABNORMAL HIGH (ref 0.00–1.49)
INR: 1.6 — ABNORMAL HIGH (ref 0.00–1.49)
Prothrombin Time: 21.1 seconds — ABNORMAL HIGH (ref 11.6–15.2)
Prothrombin Time: 19.1 seconds — ABNORMAL HIGH (ref 11.6–15.2)

## 2014-04-13 LAB — APTT
aPTT: 47 seconds — ABNORMAL HIGH (ref 24–37)
aPTT: 42 seconds — ABNORMAL HIGH (ref 24–37)

## 2014-04-13 LAB — CREATININE, SERUM
Creatinine, Ser: 0.76 mg/dL (ref 0.50–1.35)
GFR calc Af Amer: >90 mL/min (ref >90)
GFR calc non Af Amer: 88 mL/min — ABNORMAL LOW (ref >90)

## 2014-04-13 LAB — MAGNESIUM: Magnesium: 2.8 mg/dL — ABNORMAL HIGH (ref 1.5–2.5)

## 2014-04-13 SURGERY — REPLACEMENT, AORTIC VALVE, OPEN
Anesthesia: General | Site: Chest

## 2014-04-13 MED ORDER — ALBUMIN HUMAN 5 % IV SOLN
INTRAVENOUS | Status: DC | PRN
  Administered 2014-04-13 (×3): via INTRAVENOUS

## 2014-04-13 MED ORDER — MILRINONE IN DEXTROSE 20 MG/100ML IV SOLN
0.3000 ug/kg/min | INTRAVENOUS | Status: DC
  Administered 2014-04-13 (×2): 0.3 ug/kg/min via INTRAVENOUS
  Filled 2014-04-13: qty 100

## 2014-04-13 MED ORDER — PHENYLEPHRINE HCL 10 MG/ML IJ SOLN
0.0000 ug/min | INTRAVENOUS | Status: DC
  Administered 2014-04-14: 10 ug/min via INTRAVENOUS
  Filled 2014-04-13 (×2): qty 2

## 2014-04-13 MED ORDER — ALBUMIN HUMAN 5 % IV SOLN
250.0000 mL | INTRAVENOUS | Status: AC | PRN
  Administered 2014-04-13 – 2014-04-14 (×4): 250 mL via INTRAVENOUS
  Filled 2014-04-13 (×2): qty 250

## 2014-04-13 MED ORDER — HEPARIN SODIUM (PORCINE) 1000 UNIT/ML IJ SOLN
INTRAMUSCULAR | Status: DC | PRN
  Administered 2014-04-13: 20000 [IU] via INTRAVENOUS

## 2014-04-13 MED ORDER — METOCLOPRAMIDE HCL 5 MG/ML IJ SOLN
10.0000 mg | Freq: Four times a day (QID) | INTRAMUSCULAR | Status: AC
  Administered 2014-04-13 – 2014-04-14 (×4): 10 mg via INTRAVENOUS
  Filled 2014-04-13 (×2): qty 2

## 2014-04-13 MED ORDER — FENTANYL CITRATE 0.05 MG/ML IJ SOLN
INTRAMUSCULAR | Status: AC
  Filled 2014-04-13: qty 5

## 2014-04-13 MED ORDER — ACETAMINOPHEN 500 MG PO TABS
1000.0000 mg | ORAL_TABLET | Freq: Four times a day (QID) | ORAL | Status: AC
  Administered 2014-04-14 – 2014-04-18 (×16): 1000 mg via ORAL
  Filled 2014-04-13 (×19): qty 2

## 2014-04-13 MED ORDER — LACTATED RINGERS IV SOLN
INTRAVENOUS | Status: DC | PRN
  Administered 2014-04-13 (×2): via INTRAVENOUS

## 2014-04-13 MED ORDER — SODIUM CHLORIDE 0.9 % IJ SOLN
OROMUCOSAL | Status: DC | PRN
  Administered 2014-04-13 (×3): via TOPICAL

## 2014-04-13 MED ORDER — MILRINONE IN DEXTROSE 20 MG/100ML IV SOLN
INTRAVENOUS | Status: DC | PRN
  Administered 2014-04-13: 87.3 ug/kg/min via INTRAVENOUS

## 2014-04-13 MED ORDER — PROPOFOL 10 MG/ML IV BOLUS
INTRAVENOUS | Status: AC
  Filled 2014-04-13: qty 20

## 2014-04-13 MED ORDER — ROCURONIUM BROMIDE 50 MG/5ML IV SOLN
INTRAVENOUS | Status: AC
  Filled 2014-04-13: qty 1

## 2014-04-13 MED ORDER — SODIUM CHLORIDE 0.9 % IV SOLN
20.0000 ug | Freq: Once | INTRAVENOUS | Status: AC
  Administered 2014-04-13: 20 ug via INTRAVENOUS
  Filled 2014-04-13 (×2): qty 5

## 2014-04-13 MED ORDER — FENTANYL CITRATE 0.05 MG/ML IJ SOLN
50.0000 ug | INTRAMUSCULAR | Status: DC | PRN
  Administered 2014-04-13 – 2014-04-14 (×4): 100 ug via INTRAVENOUS
  Administered 2014-04-14: 50 ug via INTRAVENOUS
  Filled 2014-04-13 (×5): qty 2

## 2014-04-13 MED ORDER — METOPROLOL TARTRATE 1 MG/ML IV SOLN: 2.5000 mg | INTRAVENOUS | Status: DC | PRN

## 2014-04-13 MED ORDER — DOPAMINE-DEXTROSE 3.2-5 MG/ML-% IV SOLN: 3.0000 ug/kg/min | INTRAVENOUS | Status: DC

## 2014-04-13 MED ORDER — CHLORHEXIDINE GLUCONATE 0.12 % MT SOLN
15.0000 mL | Freq: Two times a day (BID) | OROMUCOSAL | Status: DC
  Administered 2014-04-13: 15 mL via OROMUCOSAL
  Filled 2014-04-13: qty 15

## 2014-04-13 MED ORDER — LACTATED RINGERS IV SOLN
INTRAVENOUS | Status: DC | PRN
  Administered 2014-04-13: 08:00:00 via INTRAVENOUS

## 2014-04-13 MED ORDER — SODIUM CHLORIDE 0.45 % IV SOLN
INTRAVENOUS | Status: DC
  Administered 2014-04-13: 13:00:00 via INTRAVENOUS

## 2014-04-13 MED ORDER — BISACODYL 5 MG PO TBEC
10.0000 mg | DELAYED_RELEASE_TABLET | Freq: Every day | ORAL | Status: DC
  Administered 2014-04-14: 10 mg via ORAL
  Filled 2014-04-13: qty 2

## 2014-04-13 MED ORDER — PROTAMINE SULFATE 10 MG/ML IV SOLN
INTRAVENOUS | Status: DC | PRN
  Administered 2014-04-13: 200 mg via INTRAVENOUS

## 2014-04-13 MED ORDER — MIDAZOLAM HCL 10 MG/2ML IJ SOLN
INTRAMUSCULAR | Status: AC
  Filled 2014-04-13: qty 2

## 2014-04-13 MED ORDER — SODIUM CHLORIDE 0.9 % IV SOLN
INTRAVENOUS | Status: DC
  Administered 2014-04-13: 20:00:00 via INTRAVENOUS
  Filled 2014-04-13 (×2): qty 2.5

## 2014-04-13 MED ORDER — SODIUM CHLORIDE 0.9 % IJ SOLN
3.0000 mL | Freq: Two times a day (BID) | INTRAMUSCULAR | Status: DC
  Administered 2014-04-14 – 2014-04-16 (×4): 3 mL via INTRAVENOUS
  Administered 2014-04-16: 10 mL via INTRAVENOUS
  Administered 2014-04-17 – 2014-04-18 (×2): 3 mL via INTRAVENOUS

## 2014-04-13 MED ORDER — NITROGLYCERIN IN D5W 200-5 MCG/ML-% IV SOLN: 0.0000 ug/min | INTRAVENOUS | Status: DC

## 2014-04-13 MED ORDER — DEXMEDETOMIDINE HCL IN NACL 200 MCG/50ML IV SOLN
0.1000 ug/kg/h | INTRAVENOUS | Status: DC
  Administered 2014-04-13: 0.7 ug/kg/h via INTRAVENOUS
  Filled 2014-04-13: qty 50

## 2014-04-13 MED ORDER — MIDAZOLAM HCL 2 MG/2ML IJ SOLN
2.0000 mg | INTRAMUSCULAR | Status: DC | PRN
  Administered 2014-04-13: 2 mg via INTRAVENOUS
  Filled 2014-04-13: qty 2

## 2014-04-13 MED ORDER — BISACODYL 10 MG RE SUPP: 10.0000 mg | Freq: Every day | RECTAL | Status: DC

## 2014-04-13 MED ORDER — SODIUM CHLORIDE 0.9 % IV SOLN
20.0000 ug | Freq: Once | INTRAVENOUS | Status: AC
  Administered 2014-04-13: 20 ug via INTRAVENOUS
  Filled 2014-04-13: qty 5

## 2014-04-13 MED ORDER — ROCURONIUM BROMIDE 100 MG/10ML IV SOLN
INTRAVENOUS | Status: DC | PRN
  Administered 2014-04-13: 50 mg via INTRAVENOUS

## 2014-04-13 MED ORDER — CALCIUM CHLORIDE 10 % IV SOLN
INTRAVENOUS | Status: DC | PRN
  Administered 2014-04-13 (×6): 200 mg via INTRAVENOUS

## 2014-04-13 MED ORDER — ONDANSETRON HCL 4 MG/2ML IJ SOLN
4.0000 mg | Freq: Four times a day (QID) | INTRAMUSCULAR | Status: DC | PRN
  Filled 2014-04-13: qty 2

## 2014-04-13 MED ORDER — PANTOPRAZOLE SODIUM 40 MG PO TBEC
40.0000 mg | DELAYED_RELEASE_TABLET | Freq: Every day | ORAL | Status: DC
  Administered 2014-04-15 – 2014-04-19 (×5): 40 mg via ORAL
  Filled 2014-04-13 (×5): qty 1

## 2014-04-13 MED ORDER — PHENYLEPHRINE 40 MCG/ML (10ML) SYRINGE FOR IV PUSH (FOR BLOOD PRESSURE SUPPORT)
PREFILLED_SYRINGE | INTRAVENOUS | Status: AC
  Filled 2014-04-13: qty 10

## 2014-04-13 MED ORDER — VANCOMYCIN HCL IN DEXTROSE 1-5 GM/200ML-% IV SOLN
1000.0000 mg | Freq: Two times a day (BID) | INTRAVENOUS | Status: AC
  Administered 2014-04-13 – 2014-04-14 (×3): 1000 mg via INTRAVENOUS
  Filled 2014-04-13 (×3): qty 200

## 2014-04-13 MED ORDER — SODIUM CHLORIDE 0.9 % IV SOLN
1.0000 g/h | Freq: Once | INTRAVENOUS | Status: DC
  Filled 2014-04-13: qty 20

## 2014-04-13 MED ORDER — ACETAMINOPHEN 650 MG RE SUPP
650.0000 mg | Freq: Once | RECTAL | Status: AC
  Administered 2014-04-13: 650 mg via RECTAL

## 2014-04-13 MED ORDER — LACTATED RINGERS IV SOLN: 500.0000 mL | Freq: Once | INTRAVENOUS | Status: DC | PRN

## 2014-04-13 MED ORDER — SODIUM CHLORIDE 0.9 % IJ SOLN: 3.0000 mL | INTRAMUSCULAR | Status: DC | PRN

## 2014-04-13 MED ORDER — LACTATED RINGERS IV SOLN: INTRAVENOUS | Status: DC

## 2014-04-13 MED ORDER — ACETAMINOPHEN 160 MG/5ML PO SOLN
1000.0000 mg | Freq: Four times a day (QID) | ORAL | Status: AC
  Administered 2014-04-14: 1000 mg
  Filled 2014-04-13: qty 40.6

## 2014-04-13 MED ORDER — OXYCODONE HCL 5 MG PO TABS
5.0000 mg | ORAL_TABLET | ORAL | Status: DC | PRN
  Administered 2014-04-15 – 2014-04-19 (×4): 5 mg via ORAL
  Filled 2014-04-13 (×5): qty 1

## 2014-04-13 MED ORDER — METOPROLOL TARTRATE 12.5 MG HALF TABLET
12.5000 mg | ORAL_TABLET | Freq: Two times a day (BID) | ORAL | Status: DC
  Filled 2014-04-13 (×3): qty 1

## 2014-04-13 MED ORDER — FAMOTIDINE IN NACL 20-0.9 MG/50ML-% IV SOLN
20.0000 mg | Freq: Two times a day (BID) | INTRAVENOUS | Status: AC
  Administered 2014-04-13 (×2): 20 mg via INTRAVENOUS
  Filled 2014-04-13: qty 50

## 2014-04-13 MED ORDER — MAGNESIUM SULFATE 4000MG/100ML IJ SOLN
4.0000 g | Freq: Once | INTRAMUSCULAR | Status: AC
  Administered 2014-04-13: 4 g via INTRAVENOUS
  Filled 2014-04-13: qty 100

## 2014-04-13 MED ORDER — MIDAZOLAM HCL 5 MG/5ML IJ SOLN
INTRAMUSCULAR | Status: DC | PRN
  Administered 2014-04-13: 2 mg via INTRAVENOUS
  Administered 2014-04-13: 3 mg via INTRAVENOUS
  Administered 2014-04-13: 1 mg via INTRAVENOUS
  Administered 2014-04-13: 4 mg via INTRAVENOUS

## 2014-04-13 MED ORDER — CETYLPYRIDINIUM CHLORIDE 0.05 % MT LIQD
7.0000 mL | Freq: Four times a day (QID) | OROMUCOSAL | Status: DC
Start: 2014-04-13 — End: 2014-04-16
  Administered 2014-04-13 – 2014-04-16 (×11): 7 mL via OROMUCOSAL

## 2014-04-13 MED ORDER — PHENYLEPHRINE HCL 10 MG/ML IJ SOLN
10.0000 mg | INTRAVENOUS | Status: DC | PRN
  Administered 2014-04-13: 5 ug/min via INTRAVENOUS

## 2014-04-13 MED ORDER — EPHEDRINE SULFATE 50 MG/ML IJ SOLN
INTRAMUSCULAR | Status: AC
  Filled 2014-04-13: qty 1

## 2014-04-13 MED ORDER — LIDOCAINE HCL (CARDIAC) 20 MG/ML IV SOLN
INTRAVENOUS | Status: AC
  Filled 2014-04-13: qty 5

## 2014-04-13 MED ORDER — POTASSIUM CHLORIDE 10 MEQ/50ML IV SOLN
10.0000 meq | INTRAVENOUS | Status: AC
  Administered 2014-04-13 (×2): 10 meq via INTRAVENOUS

## 2014-04-13 MED ORDER — 0.9 % SODIUM CHLORIDE (POUR BTL) OPTIME
TOPICAL | Status: DC | PRN
  Administered 2014-04-13: 7000 mL

## 2014-04-13 MED ORDER — PROTAMINE SULFATE 10 MG/ML IV SOLN
INTRAVENOUS | Status: AC
  Filled 2014-04-13: qty 10

## 2014-04-13 MED ORDER — MILRINONE IN DEXTROSE 20 MG/100ML IV SOLN
0.1250 ug/kg/min | INTRAVENOUS | Status: DC
  Filled 2014-04-13: qty 100

## 2014-04-13 MED ORDER — HEMOSTATIC AGENTS (NO CHARGE) OPTIME
TOPICAL | Status: DC | PRN
  Administered 2014-04-13: 1 via TOPICAL

## 2014-04-13 MED ORDER — VECURONIUM BROMIDE 10 MG IV SOLR
INTRAVENOUS | Status: DC | PRN
  Administered 2014-04-13: 20 mg via INTRAVENOUS

## 2014-04-13 MED ORDER — LIDOCAINE HCL (CARDIAC) 20 MG/ML IV SOLN
INTRAVENOUS | Status: DC | PRN
  Administered 2014-04-13: 100 mg via INTRAVENOUS

## 2014-04-13 MED ORDER — ASPIRIN EC 325 MG PO TBEC
325.0000 mg | DELAYED_RELEASE_TABLET | Freq: Every day | ORAL | Status: DC
  Filled 2014-04-13: qty 1

## 2014-04-13 MED ORDER — INSULIN REGULAR BOLUS VIA INFUSION
0.0000 [IU] | Freq: Three times a day (TID) | INTRAVENOUS | Status: DC
  Filled 2014-04-13: qty 10

## 2014-04-13 MED ORDER — VECURONIUM BROMIDE 10 MG IV SOLR
INTRAVENOUS | Status: AC
  Filled 2014-04-13: qty 20

## 2014-04-13 MED ORDER — SODIUM CHLORIDE 0.9 % IV SOLN: 1.0000 g/h | INTRAVENOUS | Status: DC

## 2014-04-13 MED ORDER — SODIUM CHLORIDE 0.9 % IV SOLN
INTRAVENOUS | Status: DC
  Administered 2014-04-16: 20 mL/h via INTRAVENOUS

## 2014-04-13 MED ORDER — ASPIRIN 81 MG PO CHEW: 324.0000 mg | CHEWABLE_TABLET | Freq: Every day | ORAL | Status: DC

## 2014-04-13 MED ORDER — CHLORHEXIDINE GLUCONATE 4 % EX LIQD
CUTANEOUS | Status: AC
  Filled 2014-04-13: qty 60

## 2014-04-13 MED ORDER — VANCOMYCIN HCL IN DEXTROSE 1-5 GM/200ML-% IV SOLN
1000.0000 mg | Freq: Once | INTRAVENOUS | Status: DC
  Filled 2014-04-13: qty 200

## 2014-04-13 MED ORDER — DEXTROSE 5 % IV SOLN
1.5000 g | Freq: Two times a day (BID) | INTRAVENOUS | Status: AC
  Administered 2014-04-13 – 2014-04-15 (×4): 1.5 g via INTRAVENOUS
  Filled 2014-04-13 (×4): qty 1.5

## 2014-04-13 MED ORDER — SODIUM CHLORIDE 0.9 % IV SOLN: 250.0000 mL | INTRAVENOUS | Status: DC

## 2014-04-13 MED ORDER — DOCUSATE SODIUM 100 MG PO CAPS
200.0000 mg | ORAL_CAPSULE | Freq: Every day | ORAL | Status: DC
  Administered 2014-04-14: 200 mg via ORAL
  Filled 2014-04-13: qty 2

## 2014-04-13 MED ORDER — ACETAMINOPHEN 160 MG/5ML PO SOLN: 650.0000 mg | Freq: Once | ORAL | Status: AC

## 2014-04-13 MED ORDER — METOPROLOL TARTRATE 25 MG/10 ML ORAL SUSPENSION
12.5000 mg | Freq: Two times a day (BID) | ORAL | Status: DC
  Filled 2014-04-13 (×3): qty 5

## 2014-04-13 MED ORDER — HEPARIN SODIUM (PORCINE) 1000 UNIT/ML IJ SOLN
INTRAMUSCULAR | Status: AC
  Filled 2014-04-13: qty 1

## 2014-04-13 MED ORDER — FENTANYL CITRATE 0.05 MG/ML IJ SOLN
INTRAMUSCULAR | Status: DC | PRN
  Administered 2014-04-13: 50 ug via INTRAVENOUS
  Administered 2014-04-13: 200 ug via INTRAVENOUS
  Administered 2014-04-13: 500 ug via INTRAVENOUS
  Administered 2014-04-13: 250 ug via INTRAVENOUS

## 2014-04-13 MED ORDER — PROPOFOL 10 MG/ML IV BOLUS
INTRAVENOUS | Status: DC | PRN
  Administered 2014-04-13: 40 mg via INTRAVENOUS

## 2014-04-13 MED ORDER — SODIUM CHLORIDE 0.9 % IJ SOLN
INTRAMUSCULAR | Status: AC
  Filled 2014-04-13: qty 30

## 2014-04-13 MED FILL — Sodium Bicarbonate IV Soln 8.4%: INTRAVENOUS | Qty: 50 | Status: AC

## 2014-04-13 MED FILL — Calcium Chloride Inj 10%: INTRAVENOUS | Qty: 10 | Status: AC

## 2014-04-13 MED FILL — Sodium Chloride IV Soln 0.9%: INTRAVENOUS | Qty: 2000 | Status: AC

## 2014-04-13 MED FILL — Mannitol IV Soln 20%: INTRAVENOUS | Qty: 500 | Status: AC

## 2014-04-13 MED FILL — Electrolyte-R (PH 7.4) Solution: INTRAVENOUS | Qty: 4000 | Status: AC

## 2014-04-13 MED FILL — Heparin Sodium (Porcine) Inj 1000 Unit/ML: INTRAMUSCULAR | Qty: 10 | Status: AC

## 2014-04-13 MED FILL — Lidocaine HCl IV Inj 20 MG/ML: INTRAVENOUS | Qty: 5 | Status: AC

## 2014-04-13 SURGICAL SUPPLY — 86 items
ADAPTER CARDIO PERF ANTE/RETRO (ADAPTER) ×3 IMPLANT
ADH SRG 12 PREFL SYR 3 SPRDR (MISCELLANEOUS)
ADPR PRFSN 84XANTGRD RTRGD (ADAPTER) ×1
ATTRACTOMAT 16X20 MAGNETIC DRP (DRAPES) ×3 IMPLANT
BAG DECANTER FOR FLEXI CONT (MISCELLANEOUS) ×3 IMPLANT
BLADE STERNUM SYSTEM 6 (BLADE) ×3 IMPLANT
BLADE SURG 12 STRL SS (BLADE) ×3 IMPLANT
BLADE SURG 15 STRL LF DISP TIS (BLADE) ×1 IMPLANT
BLADE SURG 15 STRL SS (BLADE) ×3
CANISTER SUCTION 2500CC (MISCELLANEOUS) ×3 IMPLANT
CANNULA GUNDRY RCSP 15FR (MISCELLANEOUS) ×3 IMPLANT
CATH CPB KIT VANTRIGT (MISCELLANEOUS) ×3 IMPLANT
CATH HEART VENT LEFT (CATHETERS) ×1 IMPLANT
CATH RETROPLEGIA CORONARY 14FR (CATHETERS) IMPLANT
CATH ROBINSON RED A/P 18FR (CATHETERS) ×15 IMPLANT
CATH THORACIC 36FR RT ANG (CATHETERS) ×3 IMPLANT
CLIP FOGARTY SPRING 6M (CLIP) IMPLANT
CONT SPEC 4OZ CLIKSEAL STRL BL (MISCELLANEOUS) ×3 IMPLANT
COVER SURGICAL LIGHT HANDLE (MISCELLANEOUS) ×6 IMPLANT
CRADLE DONUT ADULT HEAD (MISCELLANEOUS) ×3 IMPLANT
DRAIN CHANNEL 32F RND 10.7 FF (WOUND CARE) ×3 IMPLANT
DRAPE CARDIOVASCULAR INCISE (DRAPES) ×3
DRAPE SLUSH/WARMER DISC (DRAPES) ×3 IMPLANT
DRAPE SRG 135X102X78XABS (DRAPES) ×1 IMPLANT
DRSG AQUACEL AG ADV 3.5X14 (GAUZE/BANDAGES/DRESSINGS) ×3 IMPLANT
ELECT BLADE 4.0 EZ CLEAN MEGAD (MISCELLANEOUS) ×3
ELECT BLADE 6.5 EXT (BLADE) ×3 IMPLANT
ELECT CAUTERY BLADE 6.4 (BLADE) ×3 IMPLANT
ELECT REM PT RETURN 9FT ADLT (ELECTROSURGICAL) ×6
ELECTRODE BLDE 4.0 EZ CLN MEGD (MISCELLANEOUS) ×1 IMPLANT
ELECTRODE REM PT RTRN 9FT ADLT (ELECTROSURGICAL) ×2 IMPLANT
GAUZE SPONGE 4X4 12PLY STRL (GAUZE/BANDAGES/DRESSINGS) ×6 IMPLANT
GLOVE BIO SURGEON STRL SZ 6 (GLOVE) ×4 IMPLANT
GLOVE BIO SURGEON STRL SZ7.5 (GLOVE) ×6 IMPLANT
GLOVE BIO SURGEON STRL SZ8 (GLOVE) ×3 IMPLANT
GLOVE BIOGEL PI IND STRL 6.5 (GLOVE) ×4 IMPLANT
GLOVE BIOGEL PI IND STRL 7.0 (GLOVE) ×1 IMPLANT
GLOVE BIOGEL PI INDICATOR 6.5 (GLOVE) ×8
GLOVE BIOGEL PI INDICATOR 7.0 (GLOVE) ×2
GLOVE SURG SS PI 6.5 STRL IVOR (GLOVE) ×6 IMPLANT
GOWN STRL REUS W/ TWL LRG LVL3 (GOWN DISPOSABLE) ×4 IMPLANT
GOWN STRL REUS W/TWL LRG LVL3 (GOWN DISPOSABLE) ×12
HEMOSTAT POWDER SURGIFOAM 1G (HEMOSTASIS) ×9 IMPLANT
HEMOSTAT SURGICEL 2X14 (HEMOSTASIS) ×3 IMPLANT
INSERT FOGARTY XLG (MISCELLANEOUS) IMPLANT
KIT BASIN OR (CUSTOM PROCEDURE TRAY) ×3 IMPLANT
KIT DRAINAGE VACCUM ASSIST (KITS) ×3 IMPLANT
KIT ROOM TURNOVER OR (KITS) ×3 IMPLANT
KIT SUCTION CATH 14FR (SUCTIONS) ×3 IMPLANT
LEAD PACING MYOCARDI (MISCELLANEOUS) ×3 IMPLANT
LINE VENT (MISCELLANEOUS) ×3 IMPLANT
NS IRRIG 1000ML POUR BTL (IV SOLUTION) ×18 IMPLANT
PACK OPEN HEART (CUSTOM PROCEDURE TRAY) ×3 IMPLANT
PAD ARMBOARD 7.5X6 YLW CONV (MISCELLANEOUS) ×9 IMPLANT
SEALANT SURG COSEAL 8ML (VASCULAR PRODUCTS) ×2 IMPLANT
SET CARDIOPLEGIA MPS 5001102 (MISCELLANEOUS) ×3 IMPLANT
SPONGE GAUZE 4X4 12PLY STER LF (GAUZE/BANDAGES/DRESSINGS) ×2 IMPLANT
SURGIFLO W/THROMBIN 8M KIT (HEMOSTASIS) ×3 IMPLANT
SUT BONE WAX W31G (SUTURE) ×3 IMPLANT
SUT ETHIBON 2 0 V 52N 30 (SUTURE) ×8 IMPLANT
SUT ETHIBOND 2 0 SH (SUTURE) ×3
SUT ETHIBOND 2 0 SH 36X2 (SUTURE) ×1 IMPLANT
SUT ETHIBOND V-5 VALVE (SUTURE) ×3 IMPLANT
SUT PROLENE 3 0 RB 1 (SUTURE) ×3 IMPLANT
SUT PROLENE 3 0 SH 1 (SUTURE) IMPLANT
SUT PROLENE 3 0 SH DA (SUTURE) IMPLANT
SUT PROLENE 4 0 RB 1 (SUTURE) ×9
SUT PROLENE 4 0 SH DA (SUTURE) ×5 IMPLANT
SUT PROLENE 4-0 RB1 .5 CRCL 36 (SUTURE) ×3 IMPLANT
SUT STEEL 6MS V (SUTURE) ×3 IMPLANT
SUT STEEL SZ 6 DBL 3X14 BALL (SUTURE) ×3 IMPLANT
SUT VIC AB 1 CTX 36 (SUTURE) ×15
SUT VIC AB 1 CTX36XBRD ANBCTR (SUTURE) ×5 IMPLANT
SUT VIC AB 2-0 CTX 27 (SUTURE) IMPLANT
SUT VIC AB 3-0 X1 27 (SUTURE) IMPLANT
SYR 10ML KIT SKIN ADHESIVE (MISCELLANEOUS) IMPLANT
SYSTEM SAHARA CHEST DRAIN ATS (WOUND CARE) ×3 IMPLANT
TAPE CLOTH SURG 4X10 WHT LF (GAUZE/BANDAGES/DRESSINGS) ×3 IMPLANT
TAPE PAPER 2X10 WHT MICROPORE (GAUZE/BANDAGES/DRESSINGS) ×3 IMPLANT
TOWEL OR 17X24 6PK STRL BLUE (TOWEL DISPOSABLE) ×6 IMPLANT
TOWEL OR 17X26 10 PK STRL BLUE (TOWEL DISPOSABLE) ×6 IMPLANT
TRAY FOLEY IC TEMP SENS 16FR (CATHETERS) ×3 IMPLANT
UNDERPAD 30X30 INCONTINENT (UNDERPADS AND DIAPERS) ×3 IMPLANT
VALVE MAGNA EASE 21MM (Prosthesis & Implant Heart) ×2 IMPLANT
VENT LEFT HEART 12002 (CATHETERS) ×3
WATER STERILE IRR 1000ML POUR (IV SOLUTION) ×6 IMPLANT

## 2014-04-13 NOTE — Progress Notes (Signed)
RT note-increased  Peep per Dr. Darcey Nora for bleeding purposes.

## 2014-04-13 NOTE — Anesthesia Postprocedure Evaluation (Signed)
  Anesthesia Post-op Note  Patient: [name]  Procedure(s) Performed: Procedure(s) with comments: AORTIC VALVE REPLACEMENT (AVR) (N/A) - MAGNA EASE 21  Patient Location: SICU  Anesthesia Type:General  Level of Consciousness: sedated and Patient remains intubated per anesthesia plan  Airway and Oxygen Therapy: Patient remains intubated per anesthesia plan and Patient placed on Ventilator (see vital sign flow sheet for setting)  Post-op Pain: none  Post-op Assessment: Post-op Vital signs reviewed, Patient's Cardiovascular Status Stable, Respiratory Function Stable, Patent Airway, No signs of Nausea or vomiting and Pain level controlled  Post-op Vital Signs: stable  Last Vitals:  Filed Vitals:   04/13/14 0422  BP: 120/71  Pulse: 66  Temp: 36.5 C  Resp: 16    Complications: No apparent anesthesia complications

## 2014-04-13 NOTE — Transfer of Care (Signed)
Immediate Anesthesia Transfer of Care Note  Patient: [name]  Procedure(s) Performed: Procedure(s) with comments: AORTIC VALVE REPLACEMENT (AVR) (N/A) - MAGNA EASE 21  Patient Location: SICU  Anesthesia Type:General  Level of Consciousness: sedated and unresponsive  Airway & Oxygen Therapy: Patient remains intubated per anesthesia plan and Patient placed on Ventilator (see vital sign flow sheet for setting)  Post-op Assessment: Report given to PACU RN and Post -op Vital signs reviewed and stable  Post vital signs: Reviewed and stable  Complications: No apparent anesthesia complications

## 2014-04-13 NOTE — Brief Op Note (Addendum)
04/08/2014 - 04/13/2014  11:33 AM  PATIENT:  [name]  73 y.o. male  PRE-OPERATIVE DIAGNOSIS:  CAD  POST-OPERATIVE DIAGNOSIS:  Coronary Artery Disease  PROCEDURE:  Procedure(s) with comments:  AORTIC VALVE REPLACEMENT (AVR)  -21 mm Edwards Magna Ease Pericardial Tissue Valve  SURGEON:  Surgeon(s) and Role:    * [name], MD - Primary  PHYSICIAN ASSISTANT: Ellwood Handler PA-C  ANESTHESIA:   general  EBL:  Total I/O In: 1000 [I.V.:1000] Out: 100 [Urine:100]  BLOOD ADMINISTERED: CC CELLSAVER, DDAVP  DRAINS: Right Pleural Chest Tube, Mediastinal Chest Drain   LOCAL MEDICATIONS USED:  NONE  SPECIMEN:  Source of Specimen:  Aortic Valve Leaflets  DISPOSITION OF SPECIMEN:  PATHOLOGY  COUNTS:  YES  TOURNIQUET:  * No tourniquets in log *  DICTATION: .Dragon Dictation  PLAN OF CARE: Admit to inpatient   PATIENT DISPOSITION:  ICU - intubated and hemodynamically stable.   Delay start of Pharmacological VTE agent (>24hrs) due to surgical blood loss or risk of bleeding: yes    Aortic Valve Etiology   Aortic Insufficiency:  Trivial/Trace  Aortic Valve Disease:  Yes.  Aortic Stenosis:  Yes. Smallest Aortic Valve Area: 0.52 cm2; Highest Mean Gradient: 39 mmHg.  Etiology (Choose at least one and up to  5 etiologies):  Degenerative - Calcified   Aortic Valve  Procedure Performed:  Replacement: Yes.  Bioprosthetic Valve. Implant Model Number:[id number], Size:21, Unique Device Identifier:\[id number]  Repair/Reconstruction: No.   Aortic Annular Enlargement: No.

## 2014-04-13 NOTE — Progress Notes (Signed)
Patient ID: [name], male   DOB: 04/06/1941, 73 y.o.   MRN: [id number] EVENING ROUNDS NOTE :     [address]       [place] [address]             [phone]                 Day of Surgery Procedure(s) (LRB): AORTIC VALVE REPLACEMENT (AVR) (N/A)  Total Length of Stay:  LOS: 5 days  BP 119/60  Pulse 96  Temp(Src) 98.1 F (36.7 C) (Core (Comment))  Resp 12  Ht 5\' 8"  (1.727 m)  Wt 128 lb 4.9 oz (58.2 kg)  BMI 19.51 kg/m2  SpO2 100%  .Intake/Output     09/07 0701 - 09/08 0700 09/08 0701 - 09/09 0700   P.O. 480    I.V. (mL/kg)  3921.7 (67.4)   Blood  1386.1   IV Piggyback  1450   Total Intake(mL/kg) 480 (8.2) 6757.8 (116.1)   Urine (mL/kg/hr) 500 (0.4) 1925 (3)   Blood  1300 (2.1)   Chest Tube  680 (1.1)   Total Output 500 3905   Net -20 +2852.8        Urine Occurrence 1 x      . sodium chloride 20 mL/hr at 04/13/14 1700  . sodium chloride 20 mL/hr at 04/13/14 1700  . [START ON 04/14/2014] sodium chloride    . dexmedetomidine 0.502 mcg/kg/hr (04/13/14 1700)  . DOPamine 4.032 mcg/kg/min (04/13/14 1700)  . insulin (NOVOLIN-R) infusion 4.3 Units/hr (04/13/14 1700)  . lactated ringers 20 mL/hr at 04/13/14 1700  . milrinone 0.3 mcg/kg/min (04/13/14 1700)  . nitroGLYCERIN Stopped (04/13/14 1315)  . phenylephrine (NEO-SYNEPHRINE) Adult infusion 25 mcg/min (04/13/14 1700)     Lab Results  Component Value Date   WBC 12.4* 04/13/2014   HGB 12.1* 04/13/2014   HCT 35.5* 04/13/2014   PLT 87* 04/13/2014   GLUCOSE 212* 04/13/2014   CHOL 151 02/22/2014   TRIG 176* 02/22/2014   HDL 38* 02/22/2014   LDLCALC 78 02/22/2014   ALT 22 02/22/2014   AST 22 02/22/2014   NA 130* 04/13/2014   K 4.6 04/13/2014   CL 99 04/13/2014   CREATININE 0.60 04/13/2014   BUN 13 04/13/2014   CO2 30 04/12/2014   TSH 3.998 02/22/2014   PSA 0.37 07/13/2008   INR 1.82* 04/13/2014   HGBA1C 6.4* 02/22/2014   MICROALBUR 1.24 02/22/2014   chest tube output decreased to 30 ml past hour after ddavp Still  sedated plts 87,000 - given plts after  Repeat labs pending  BRIGGS EDELEN MD  Beeper [phone] Office [phone] 04/13/2014 5:54 PM

## 2014-04-13 NOTE — Progress Notes (Signed)
The patient was examined and preop studies reviewed. There has been no change from the prior exam and the patient is ready for surgery.   Plan AVR , possible LVAD on [name] today

## 2014-04-13 NOTE — Anesthesia Procedure Notes (Signed)
Procedure Name: Intubation Date/Time: 04/13/2014 8:48 AM Performed by: Octavio Graves Pre-anesthesia Checklist: Patient identified, Timeout performed, Emergency Drugs available, Suction available and Patient being monitored Patient Re-evaluated:Patient Re-evaluated prior to inductionOxygen Delivery Method: Circle system utilized Preoxygenation: Pre-oxygenation with 100% oxygen Intubation Type: IV induction Ventilation: Mask ventilation without difficulty Laryngoscope Size: Miller and 2 Grade View: Grade I Tube type: Oral Tube size: 8.0 mm Number of attempts: 1 Airway Equipment and Method: Stylet Placement Confirmation: ETT inserted through vocal cords under direct vision,  breath sounds checked- equal and bilateral and positive ETCO2 Secured at: 22 cm Tube secured with: Tape Comments: IV induction Smith- intubation AM CRNA- atraumatic mouth as preop- no teeth-

## 2014-04-13 NOTE — OR Nursing (Signed)
Second call to SICU 

## 2014-04-13 NOTE — OR Nursing (Signed)
1st call to SICU 

## 2014-04-13 NOTE — Progress Notes (Signed)
*  PRELIMINARY RESULTS* Echocardiogram 2D Echocardiogram has been performed.  Leavy Cella 04/13/2014, 1:02 PM

## 2014-04-13 NOTE — Anesthesia Preprocedure Evaluation (Addendum)
Anesthesia Evaluation  Patient identified by MRN, date of birth, ID band Patient awake    Reviewed: Allergy & Precautions, H&P , NPO status , Patient's Chart, lab work & pertinent test results  Airway       Dental  (+) Dental Advisory Given   Pulmonary former smoker,          Cardiovascular hypertension, + angina + CAD + dysrhythmias + Valvular Problems/Murmurs AS     Neuro/Psych  Headaches,    GI/Hepatic GERD-  ,  Endo/Other  diabetes, Type 2, Oral Hypoglycemic Agents  Renal/GU Renal InsufficiencyRenal disease     Musculoskeletal   Abdominal   Peds  Hematology   Anesthesia Other Findings   Reproductive/Obstetrics                          Anesthesia Physical Anesthesia Plan  ASA: III  Anesthesia Plan: General   Post-op Pain Management:    Induction: Intravenous  Airway Management Planned: Oral ETT  Additional Equipment: Arterial line, CVP, PA Cath and TEE  Intra-op Plan:   Post-operative Plan: Post-operative intubation/ventilation  Informed Consent: I have reviewed the patients History and Physical, chart, labs and discussed the procedure including the risks, benefits and alternatives for the proposed anesthesia with the patient or authorized representative who has indicated his/her understanding and acceptance.     Plan Discussed with: CRNA, Anesthesiologist and Surgeon  Anesthesia Plan Comments:         Anesthesia Quick Evaluation

## 2014-04-14 ENCOUNTER — Inpatient Hospital Stay (HOSPITAL_COMMUNITY): Payer: Medicare Other

## 2014-04-14 ENCOUNTER — Ambulatory Visit: Payer: Medicare Other | Admitting: Physician Assistant

## 2014-04-14 LAB — PREPARE FRESH FROZEN PLASMA
Unit division: 0
Unit division: 0

## 2014-04-14 LAB — POCT I-STAT 3, ART BLOOD GAS (G3+)
Acid-base deficit: 1 mmol/L (ref 0.0–2.0)
Acid-base deficit: 3 mmol/L — ABNORMAL HIGH (ref 0.0–2.0)
BICARBONATE: 21.9 meq/L (ref 20.0–24.0)
Bicarbonate: 23.3 mEq/L (ref 20.0–24.0)
O2 SAT: 99 %
O2 Saturation: 98 %
PCO2 ART: 37.9 mmHg (ref 35.0–45.0)
PH ART: 7.363 (ref 7.350–7.450)
PH ART: 7.402 (ref 7.350–7.450)
PO2 ART: 121 mmHg — AB (ref 80.0–100.0)
PO2 ART: 134 mmHg — AB (ref 80.0–100.0)
Patient temperature: 38.3
TCO2: 23 mmol/L (ref 0–100)
TCO2: 24 mmol/L (ref 0–100)
pCO2 arterial: 38.7 mmHg (ref 35.0–45.0)

## 2014-04-14 LAB — CBC
HCT: 21.2 % — ABNORMAL LOW (ref 39.0–52.0)
HCT: 30.5 % — ABNORMAL LOW (ref 39.0–52.0)
HEMOGLOBIN: 7.1 g/dL — AB (ref 13.0–17.0)
Hemoglobin: 10.5 g/dL — ABNORMAL LOW (ref 13.0–17.0)
MCH: 30.6 pg (ref 26.0–34.0)
MCH: 30.7 pg (ref 26.0–34.0)
MCHC: 33.5 g/dL (ref 30.0–36.0)
MCHC: 34.4 g/dL (ref 30.0–36.0)
MCV: 91.4 fL (ref 78.0–100.0)
MCV: 89.2 fL (ref 78.0–100.0)
Platelets: 64 10*3/uL — ABNORMAL LOW (ref 150–400)
Platelets: 64 10*3/uL — ABNORMAL LOW (ref 150–400)
RBC: 2.32 MIL/uL — AB (ref 4.22–5.81)
RBC: 3.42 MIL/uL — AB (ref 4.22–5.81)
RDW: 14.4 % (ref 11.5–15.5)
RDW: 15.6 % — ABNORMAL HIGH (ref 11.5–15.5)
WBC: 5.8 10*3/uL (ref 4.0–10.5)
WBC: 8.5 10*3/uL (ref 4.0–10.5)

## 2014-04-14 LAB — GLUCOSE, CAPILLARY
GLUCOSE-CAPILLARY: 110 mg/dL — AB (ref 70–99)
GLUCOSE-CAPILLARY: 114 mg/dL — AB (ref 70–99)
GLUCOSE-CAPILLARY: 119 mg/dL — AB (ref 70–99)
GLUCOSE-CAPILLARY: 119 mg/dL — AB (ref 70–99)
GLUCOSE-CAPILLARY: 125 mg/dL — AB (ref 70–99)
GLUCOSE-CAPILLARY: 132 mg/dL — AB (ref 70–99)
GLUCOSE-CAPILLARY: 137 mg/dL — AB (ref 70–99)
GLUCOSE-CAPILLARY: 156 mg/dL — AB (ref 70–99)
GLUCOSE-CAPILLARY: 160 mg/dL — AB (ref 70–99)
Glucose-Capillary: 113 mg/dL — ABNORMAL HIGH (ref 70–99)
Glucose-Capillary: 113 mg/dL — ABNORMAL HIGH (ref 70–99)
Glucose-Capillary: 105 mg/dL — ABNORMAL HIGH (ref 70–99)
Glucose-Capillary: 95 mg/dL (ref 70–99)
Glucose-Capillary: 107 mg/dL — ABNORMAL HIGH (ref 70–99)
Glucose-Capillary: 114 mg/dL — ABNORMAL HIGH (ref 70–99)
Glucose-Capillary: 140 mg/dL — ABNORMAL HIGH (ref 70–99)
Glucose-Capillary: 155 mg/dL — ABNORMAL HIGH (ref 70–99)

## 2014-04-14 LAB — PREPARE PLATELET PHERESIS: Unit division: 0

## 2014-04-14 LAB — CREATININE, SERUM
CREATININE: 0.72 mg/dL (ref 0.50–1.35)
GFR calc Af Amer: >90 mL/min (ref >90)
GFR calc non Af Amer: 90 mL/min — ABNORMAL LOW (ref >90)

## 2014-04-14 LAB — BASIC METABOLIC PANEL
ANION GAP: 10 (ref 5–15)
BUN: 12 mg/dL (ref 6–23)
CALCIUM: 8.4 mg/dL (ref 8.4–10.5)
CHLORIDE: 107 meq/L (ref 96–112)
CO2: 23 meq/L (ref 19–32)
Creatinine, Ser: 0.74 mg/dL (ref 0.50–1.35)
GFR calc Af Amer: >90 mL/min (ref >90)
GFR calc non Af Amer: 89 mL/min — ABNORMAL LOW (ref >90)
GLUCOSE: 103 mg/dL — AB (ref 70–99)
Potassium: 4.3 mEq/L (ref 3.7–5.3)
SODIUM: 140 meq/L (ref 137–147)

## 2014-04-14 LAB — PREPARE RBC (CROSSMATCH)

## 2014-04-14 LAB — MAGNESIUM
MAGNESIUM: 2.2 mg/dL (ref 1.5–2.5)
Magnesium: 1.8 mg/dL (ref 1.5–2.5)

## 2014-04-14 MED ORDER — INSULIN ASPART 100 UNIT/ML ~~LOC~~ SOLN
0.0000 [IU] | SUBCUTANEOUS | Status: DC
  Administered 2014-04-14: 6 [IU] via SUBCUTANEOUS
  Administered 2014-04-14 – 2014-04-15 (×5): 2 [IU] via SUBCUTANEOUS

## 2014-04-14 MED ORDER — INSULIN DETEMIR 100 UNIT/ML ~~LOC~~ SOLN
20.0000 [IU] | Freq: Once | SUBCUTANEOUS | Status: AC
  Administered 2014-04-14: 20 [IU] via SUBCUTANEOUS
  Filled 2014-04-14: qty 0.2

## 2014-04-14 MED ORDER — METOPROLOL TARTRATE 25 MG PO TABS
25.0000 mg | ORAL_TABLET | Freq: Two times a day (BID) | ORAL | Status: DC
  Administered 2014-04-14 – 2014-04-17 (×7): 25 mg via ORAL
  Filled 2014-04-14 (×9): qty 1

## 2014-04-14 MED ORDER — POTASSIUM CHLORIDE 10 MEQ/50ML IV SOLN
10.0000 meq | INTRAVENOUS | Status: AC
  Administered 2014-04-14 (×3): 10 meq via INTRAVENOUS
  Filled 2014-04-14 (×3): qty 50

## 2014-04-14 MED ORDER — SODIUM CHLORIDE 0.9 % IV SOLN: Freq: Once | INTRAVENOUS | Status: DC

## 2014-04-14 MED ORDER — FUROSEMIDE 10 MG/ML IJ SOLN
20.0000 mg | Freq: Once | INTRAMUSCULAR | Status: AC
  Administered 2014-04-14: 20 mg via INTRAVENOUS
  Filled 2014-04-14: qty 2

## 2014-04-14 MED ORDER — SODIUM CHLORIDE 0.9 % IV SOLN
Freq: Once | INTRAVENOUS | Status: AC
  Administered 2014-04-14: 09:00:00 via INTRAVENOUS

## 2014-04-14 MED ORDER — FENTANYL CITRATE 0.05 MG/ML IJ SOLN
25.0000 ug | INTRAMUSCULAR | Status: DC | PRN
  Administered 2014-04-14 (×2): 25 ug via INTRAVENOUS
  Filled 2014-04-14 (×3): qty 2

## 2014-04-14 MED ORDER — MIDAZOLAM HCL 2 MG/2ML IJ SOLN
2.0000 mg | INTRAMUSCULAR | Status: DC | PRN
  Administered 2014-04-15: 2 mg via INTRAVENOUS
  Filled 2014-04-14: qty 2

## 2014-04-14 MED ORDER — FUROSEMIDE 10 MG/ML IJ SOLN
40.0000 mg | Freq: Once | INTRAMUSCULAR | Status: AC
  Administered 2014-04-14: 40 mg via INTRAVENOUS
  Filled 2014-04-14: qty 4

## 2014-04-14 MED ORDER — INSULIN DETEMIR 100 UNIT/ML ~~LOC~~ SOLN
10.0000 [IU] | Freq: Every day | SUBCUTANEOUS | Status: DC
  Administered 2014-04-15: 10 [IU] via SUBCUTANEOUS
  Filled 2014-04-14 (×2): qty 0.1

## 2014-04-14 MED FILL — Potassium Chloride Inj 2 mEq/ML: INTRAVENOUS | Qty: 40 | Status: AC

## 2014-04-14 MED FILL — Heparin Sodium (Porcine) Inj 1000 Unit/ML: INTRAMUSCULAR | Qty: 30 | Status: AC

## 2014-04-14 MED FILL — Magnesium Sulfate Inj 50%: INTRAMUSCULAR | Qty: 10 | Status: AC

## 2014-04-14 NOTE — Procedures (Signed)
Extubation Procedure Note  Patient Details:   Name: [name] DOB: [birth year] MRN: [id number]   Airway Documentation:   Extubated to 4 lpm nasal cannula.  VC 100 ml, NIF -30, patient able to lift and hold head off bed.  Able to breathe around deflated cuff and vocalize post procedure.  Tolerated well, no complications noted.   Evaluation  O2 sats: stable throughout Complications: No apparent complications Patient did tolerate procedure well. Bilateral Breath Sounds: Clear   Yes  [name], Elwyn Lade 04/14/2014, 12:47 AM

## 2014-04-14 NOTE — Progress Notes (Signed)
Patient Name: [name] Date of Encounter: 04/14/2014  Active Problems:   Aortic stenosis   Chronic systolic heart failure   Unstable angina   Chest pain   S/P AVR (aortic valve replacement)    Patient Profile: 73 yo male w/ remote hx tob use, HL, DM, bilateral carotid stenosis (60-79%), and AS was found to have EF 25%, no sig CAD at cath. Had bioprosthetic AoVR 09/08.   SUBJECTIVE: Some post-op pain, some SOB. Family present w/ multiple concerns  OBJECTIVE Filed Vitals:   04/14/14 0900 04/14/14 0908 04/14/14 0915 04/14/14 0930  BP: 119/55     Pulse: 110 110 111 114  Temp: 99 F (37.2 C) 99 F (37.2 C) 99 F (37.2 C) 99.1 F (37.3 C)  TempSrc:      Resp: 27 25 24 22   Height:      Weight:      SpO2: 100% 99% 100% 97%    Intake/Output Summary (Last 24 hours) at 04/14/14 1035 Last data filed at 04/14/14 0930  Gross per 24 hour  Intake 9139.07 ml  Output   5385 ml  Net 3754.07 ml   Filed Weights   04/11/14 0300 04/13/14 0422 04/14/14 0500  Weight: 137 lb 9.6 oz (62.415 kg) 128 lb 4.9 oz (58.2 kg) 156 lb 1.4 oz (70.8 kg)    PHYSICAL EXAM General: Well developed, well nourished, male in no acute distress. Head: Normocephalic, atraumatic.  Neck: Supple without bruits, JVD elevated. Lungs:  Resp regular and unlabored, decreased BS bases. Heart: RRR, S1, S2, no S3, S4, soft murmur; no rub. Abdomen: Soft, non-tender, non-distended, BS + x 4.  Extremities: No clubbing, cyanosis, no edema.  Neuro: Alert and oriented X 3. Moves all extremities spontaneously. Psych: Normal affect.  LABS: CBC: Recent Labs  04/13/14 1910 04/14/14 0400  WBC 6.1 5.8  HGB 8.7* 7.1*  HCT 25.1* 21.2*  MCV 91.9 91.4  PLT 85* 64*   INR: Recent Labs  04/13/14 1757  INR 1.63*   Basic Metabolic Panel: Recent Labs  04/12/14 0855  04/13/14 1859 04/13/14 1910 04/14/14 0400  NA 138  < > 139  --  140  K 4.4  < > 3.8  --  4.3  CL 98  < > 107  --  107  CO2 30  --   --    --  23  GLUCOSE 178*  < > 84  --  103*  BUN 16  < > 11  --  12  CREATININE 0.83  < > 0.80 0.76 0.74  CALCIUM 9.7  --   --   --  8.4  MG  --   --   --  2.8* 2.2  < > = values in this interval not displayed.  TELE:   SR, ST, rare PVCs     Radiology/Studies: Dg Chest Portable 1 View In Am 04/14/2014   CLINICAL DATA:  Coronary artery disease.  Aortic valve replacement.  EXAM: PORTABLE CHEST - 1 VIEW  COMPARISON:  04/13/2014  FINDINGS: Changes of CABG and valve replacement. Interval extubation and removal of NG tube. Increasing bibasilar opacities, likely atelectasis, right greater than left. Small right pleural effusion. No pneumothorax. Swan-Ganz catheter is in stable position.  IMPRESSION: Worsening bibasilar atelectasis following extubation. Small right pleural effusion.   Electronically Signed   By: Rolm Baptise M.D.   On: 04/14/2014 08:07   Dg Chest Portable 1 View 04/13/2014   CLINICAL DATA:  Postop  EXAM:  PORTABLE CHEST - 1 VIEW  COMPARISON:  04/08/2014  FINDINGS: Endotracheal tube appropriately positioned, 5.1 cm above chronic. Nasogastric tube extends beyond the inferior aspect of the film. Right IJ Swan-Ganz catheter with tip at pulmonary outflow tract. Interval median sternotomy. Aortic valve repair.  Midline trachea. Normal heart size. Atherosclerosis in the transverse aorta. No pleural effusion or pneumothorax. mild atelectasis in the left mid lung.  IMPRESSION: Appropriate position of support apparatus.  Mild volume loss and subsegmental atelectasis in the left mid lung.   Electronically Signed   By: Abigail Miyamoto M.D.   On: 04/13/2014 14:01     Current Medications:  . acetaminophen  1,000 mg Oral 4 times per day   Or  . acetaminophen (TYLENOL) oral liquid 160 mg/5 mL  1,000 mg Per Tube 4 times per day  . antiseptic oral rinse  7 mL Mouth Rinse QID  . bisacodyl  10 mg Oral Daily   Or  . bisacodyl  10 mg Rectal Daily  . cefUROXime (ZINACEF)  IV  1.5 g Intravenous Q12H  . docusate  sodium  200 mg Oral Daily  . furosemide  40 mg Intravenous Once  . insulin aspart  0-24 Units Subcutaneous 6 times per day  . [START ON 04/15/2014] insulin detemir  10 Units Subcutaneous Daily  . insulin detemir  20 Units Subcutaneous Once  . insulin regular  0-10 Units Intravenous TID WC  . metoprolol tartrate  12.5 mg Oral BID   Or  . metoprolol tartrate  12.5 mg Per Tube BID  . [START ON 04/15/2014] pantoprazole  40 mg Oral Daily  . pravastatin  40 mg Oral Daily  . sodium chloride  3 mL Intravenous Q12H  . vancomycin  1,000 mg Intravenous Q12H   . sodium chloride 20 mL/hr at 04/13/14 1900  . sodium chloride 20 mL/hr at 04/13/14 1900  . sodium chloride    . dexmedetomidine Stopped (04/13/14 2215)  . DOPamine 2 mcg/kg/min (04/14/14 1028)  . insulin (NOVOLIN-R) infusion 2.6 Units/hr (04/14/14 1000)  . lactated ringers 20 mL/hr at 04/13/14 1900  . milrinone 0.125 mcg/kg/min (04/14/14 0830)  . phenylephrine (NEO-SYNEPHRINE) Adult infusion Stopped (04/14/14 0945)    ASSESSMENT AND PLAN: Active Problems:   Aortic stenosis - s/p bioprosthetic AoVR, mgt per TCTS, making progress. Pressors decreased, extubated.     Chronic systolic heart failure - Still has chest tube in place, CXR with worsening ATX, plan per MD    Unstable angina - no sig CAD at cath, no ongoing symptoms    Chest pain - currently pain is secondary to surgery    S/P AVR (aortic valve replacement) - per TCTS   Signed, Rosaria Ferries , PA-C 10:35 AM 04/14/2014  I have personally seen and examined this patient with Rosaria Ferries, PA-C.  I agree with the assessment and plan as outlined above. He is POD #1 AVR. Progressing well. Tachy but he just came off of Dopamine.   MCALHANY,CHRISTOPHER 11:01 AM 04/14/2014

## 2014-04-14 NOTE — Op Note (Signed)
NAME:  [name], [name] NO.:  [id number]  MEDICAL RECORD NO.:  [id number]  LOCATION:                                 FACILITY:  PHYSICIAN:  [name], M.D.  DATE OF BIRTH:  05/28/1941  DATE OF PROCEDURE:  04/13/2014 DATE OF DISCHARGE:                              OPERATIVE REPORT   OPERATION:  Aortic valve replacement [facility] Ease valve serial number [id number], 21 mm).  PREOPERATIVE DIAGNOSIS:  Severe aortic stenosis, nonischemic cardiomyopathy.  POSTOPERATIVE DIAGNOSIS:  Severe aortic stenosis, nonischemic cardiomyopathy.  SURGEON:  [name], MD.  ASSISTANTEllwood Handler, PA-C.  ANESTHESIA:  General by Sharolyn Douglas, MD  INDICATIONS:  The patient is a 73 year old gentleman who presented to the hospital with chest pain and progressive decline in exercise tolerance.  He underwent cardiac catheterization and echocardiogram. His coronary arteriogram showed no significant coronary disease. However, his echocardiogram showed LV dysfunction with EF of 25-30% with aortic stenosis.  Calculated aortic valve area was 0.6.  Right heart cath showed normal right-sided pressures and compensated cardiac output. Because of his diagnosis of chest pain with aortic stenosis and reduced LV function, he was felt to be candidate for aortic valve replacement.  Prior to surgery, I reviewed the results of his cardiac cath and echo with the patient and his family.  I discussed the indications and expected benefits of aortic valve replacement for treatment of his aortic stenosis.  I discussed with the patient, the main aspects of the surgery including the location of the surgical incisions, use of general anesthesia and cardiopulmonary bypass, and the expected postoperative hospital recovery.  I discussed to the patient, the risks of the surgery including risk of stroke, MI, bleeding, infection, heart arrhythmia, pleural effusion, and death.  After reviewing  these issues, he demonstrated his understanding and agreed to proceed with surgery under what I felt was an informed consent.  OPERATIVE FINDINGS: 1. COPD. 2. Trileaflet aortic valve with moderate-to-severe stenosis. 3. Successful aortic valve replacement with a 21 mm bioprosthetic     valve.  DESCRIPTION OF PROCEDURE:  The patient was brought to the operating room and placed supine on the operating room table.  General anesthesia was induced under invasive hemodynamic monitoring.  The chest, abdomen, and upper legs were prepped and draped as a sterile field.  A sternal incision was made.  The sternal retractor was placed and the pericardium was opened and suspended.  Pursestrings were placed in the ascending aorta and right atrium.  The patient was cannulated and placed on cardiopulmonary bypass.  A preoperative transesophageal echo exam of the heart confirmed the preoperative diagnosis.  There was also mild MR.  The patient was cannulated and placed on cardiopulmonary bypass.  A left ventricular vent was placed via the right superior pulmonary vein.  The pulmonary artery was dissected off the aorta.  The pursestring for antegrade and retrograde, cardioplegic cannulas were placed and the catheters were inserted.  The patient was cooled to 30 degrees.  The aortic crossclamp was applied.  One liter of cold blood cardioplegia was delivered in split doses between the antegrade aortic and retrograde coronary sinus catheters.  There was good cardioplegic arrest and septal temperature dropped less than 10 degrees.  Cardioplegia was delivered every 20 minutes while the crossclamp was in place.  A transverse aortotomy was performed.  Aortic valve was inspected.  It was trileaflet with calcification and thickening of the leaflets and calcification of the anulus.  The leaflets were excised.  The anulus was debrided of calcium.  The outflow tract was irrigated with copious amounts of cold  saline.  The anulus was sized to a 21 mm pericardial valve.  Subannular 2-0 pledgeted Ethibond sutures were placed around the anulus numbering 15 total.  The valve was prepared according to protocol.  The sutures were placed through the sewing ring and the valve was seated and the sutures were tied.  The valve conformed well to the anulus and there was no obstruction of the coronaries.  There was no evidence of a space between the sutures for a perivalvular leak.  The aortotomy was closed with 4-0 Prolene in 2 layers.  Air was vented from the left side of the heart with a dose of retrograde warm blood cardioplegia in the usual de-airing maneuvers.  The crossclamp was then removed as the aortotomy was closed.  The heart was cardioverted back to a regular rhythm.  The aortotomy was checked to be hemostatic.  Temporary pacing wires were applied.  The patient was rewarmed and reperfused.  The lungs re-expanded and ventilator was resumed.  The patient was then weaned off cardiopulmonary bypass on low-dose milrinone and dopamine without difficulty.  Cardiac function appeared to be improved.  Cardiac output was 5 L.  The aortic valve function was normal without AI or significant gradient.  The MR was improved and was now trace after separation from cardiopulmonary bypass.  Protamine was administered without adverse reaction.  There was some diffuse bleeding, this responded to topical measures and electrocautery. The superior pericardial fat was posterior and mediastinal chest tube was placed and brought through separate incision.  The sternum was closed with interrupted wire.  The pectoralis fascia was closed in running #1 Vicryl.  Subcutaneous and skin layers were closed with a running Vicryl and sterile dressings were applied.  Total cardiopulmonary bypass time was 100 minutes.     [name], M.D.     PV/MEDQ  D:  04/13/2014  T:  04/14/2014  Job:  [id number]  cc:   Dr. Carlyle Dolly

## 2014-04-14 NOTE — Progress Notes (Signed)
Patient ID: [name], male   DOB: Dec 09, 1940, 73 y.o.   MRN: [id number]  SICU Evening Rounds:  Hemodynamically stable  Still on Milrinone 0.125  Diuresing well today  BMET    Component Value Date/Time   NA 140 04/14/2014 0400   K 4.3 04/14/2014 0400   CL 107 04/14/2014 0400   CO2 23 04/14/2014 0400   GLUCOSE 103* 04/14/2014 0400   BUN 12 04/14/2014 0400   CREATININE 0.72 04/14/2014 1700   CREATININE 0.82 02/22/2014 0915   CALCIUM 8.4 04/14/2014 0400   GFRNONAA 90* 04/14/2014 1700   GFRNONAA 87 02/22/2014 0915   GFRAA >90 04/14/2014 1700   GFRAA >89 02/22/2014 0915    CBC    Component Value Date/Time   WBC 8.5 04/14/2014 1700   RBC 3.42* 04/14/2014 1700   HGB 10.5* 04/14/2014 1700   HCT 30.5* 04/14/2014 1700   PLT 64* 04/14/2014 1700   MCV 89.2 04/14/2014 1700   MCH 30.7 04/14/2014 1700   MCHC 34.4 04/14/2014 1700   RDW 15.6* 04/14/2014 1700   LYMPHSABS 1.8 04/08/2014 1301   MONOABS 0.4 04/08/2014 1301   EOSABS 0.1 04/08/2014 1301   BASOSABS 0.1 04/08/2014 1301    A/P: stable. Continue current plans. Follow up platelet count in am.

## 2014-04-14 NOTE — Progress Notes (Addendum)
[address]       [place] [address]             [phone]      1 Day Post-Op Procedure(s) (LRB): AORTIC VALVE REPLACEMENT (AVR) (N/A)  Subjective:  Patient extubated overnight.  He states that he feels rough this morning.  Pain level is about a 5 but medication does provide relief.  Patient has a morphine allergy and is tolerating Fentanyl without difficulty.  Objective: Vital signs in last 24 hours: Temp:  [96.3 F (35.7 C)-101.1 F (38.4 C)] 99.1 F (37.3 C) (09/09 0700) Pulse Rate:  [39-115] 111 (09/09 0700) Cardiac Rhythm:  [-] Sinus tachycardia (09/09 0400) Resp:  [11-29] 22 (09/09 0700) BP: (101-119)/(48-60) 105/48 mmHg (09/09 0700) SpO2:  [95 %-100 %] 97 % (09/09 0700) Arterial Line BP: (93-156)/(39-66) 118/47 mmHg (09/09 0700) FiO2 (%):  [40 %-50 %] 40 % (09/09 0000) Weight:  [156 lb 1.4 oz (70.8 kg)] 156 lb 1.4 oz (70.8 kg) (09/09 0500)  Hemodynamic parameters for last 24 hours: PAP: (25-41)/(11-29) 30/15 mmHg CO:  [4.4 L/min-7.6 L/min] 7.6 L/min CI:  [2.6 L/min/m2-4.5 L/min/m2] 4.5 L/min/m2  Intake/Output from previous day: 09/08 0701 - 09/09 0700 In: 8813.7 [I.V.:4885.9; Blood:1677.8; IV Piggyback:2250] Out: 5285 [Urine:3015; Blood:1300; Chest Tube:970]  General appearance: alert, cooperative and no distress Heart: regular rate and rhythm Lungs: clear to auscultation bilaterally Abdomen: soft, non-tender; bowel sounds normal; no masses,  no organomegaly Extremities: edema trace Wound: clean and dry  Lab Results:  Recent Labs  04/13/14 1910 04/14/14 0400  WBC 6.1 5.8  HGB 8.7* 7.1*  HCT 25.1* 21.2*  PLT 85* 64*   BMET:  Recent Labs  04/12/14 0855  04/13/14 1859 04/13/14 1910 04/14/14 0400  NA 138  < > 139  --  140  K 4.4  < > 3.8  --  4.3  CL 98  < > 107  --  107  CO2 30  --   --   --  23  GLUCOSE 178*  < > 84  --  103*  BUN 16  < > 11  --  12  CREATININE 0.83  < > 0.80 0.76 0.74  CALCIUM 9.7  --   --   --   8.4  < > = values in this interval not displayed.  PT/INR:  Recent Labs  04/13/14 1757  LABPROT 19.1*  INR 1.60*   ABG    Component Value Date/Time   PHART 7.363 04/14/2014 0145   HCO3 21.9 04/14/2014 0145   TCO2 23 04/14/2014 0145   ACIDBASEDEF 3.0* 04/14/2014 0145   O2SAT 98.0 04/14/2014 0145   CBG (last 3)   Recent Labs  04/14/14 0501 04/14/14 0602 04/14/14 0653  GLUCAP 119* 110* 113*    Assessment/Plan: S/P Procedure(s) (LRB): AORTIC VALVE REPLACEMENT (AVR) (N/A)  1. CV- NSR- weaning Dopamine, Milrinone, Neo-Synephrine as tolerated 2. Pulm- wean oxygen as tolerated, CXR free from pneumothorax, bilateral atelectasis 3. Renal- creatinine, lytes okay, + hypervolemia weight up 14 lbs since admission, hold Lasix for now until drips weaned 4. Expected Acute Blood Loss Anemia- moderate Hgb 7.1- will transfuse 2 units  5. DM- sugars controlled, will wean insulin drip, start Levemir per progression 6. Dispo- stable POD #1 wean drips as tolerated, will transfuse, leave chest tubes one more day, follow progression orders   LOS: 6 days    [name], [name] 04/14/2014 patient examined and medical record reviewed,agree with above note 2 units  PRBC for postop coagulopathy. [name],Zakeya Junker 04/14/2014

## 2014-04-14 NOTE — Progress Notes (Signed)
UR Completed.  [phone]  

## 2014-04-15 ENCOUNTER — Inpatient Hospital Stay (HOSPITAL_COMMUNITY): Payer: Medicare Other

## 2014-04-15 ENCOUNTER — Encounter (HOSPITAL_COMMUNITY): Payer: Self-pay | Admitting: Cardiothoracic Surgery

## 2014-04-15 LAB — TYPE AND SCREEN
ABO/RH(D): A POS
Antibody Screen: NEGATIVE
Unit division: 0
Unit division: 0

## 2014-04-15 LAB — GLUCOSE, CAPILLARY
Glucose-Capillary: 102 mg/dL — ABNORMAL HIGH (ref 70–99)
Glucose-Capillary: 110 mg/dL — ABNORMAL HIGH (ref 70–99)
Glucose-Capillary: 121 mg/dL — ABNORMAL HIGH (ref 70–99)
Glucose-Capillary: 123 mg/dL — ABNORMAL HIGH (ref 70–99)
Glucose-Capillary: 125 mg/dL — ABNORMAL HIGH (ref 70–99)
Glucose-Capillary: 144 mg/dL — ABNORMAL HIGH (ref 70–99)

## 2014-04-15 LAB — CBC
HCT: 29 % — ABNORMAL LOW (ref 39.0–52.0)
HCT: 29.6 % — ABNORMAL LOW (ref 39.0–52.0)
Hemoglobin: 10 g/dL — ABNORMAL LOW (ref 13.0–17.0)
Hemoglobin: 10.1 g/dL — ABNORMAL LOW (ref 13.0–17.0)
MCH: 30.6 pg (ref 26.0–34.0)
MCH: 31.1 pg (ref 26.0–34.0)
MCHC: 34.5 g/dL (ref 30.0–36.0)
MCHC: 34.1 g/dL (ref 30.0–36.0)
MCV: 88.7 fL (ref 78.0–100.0)
MCV: 91.1 fL (ref 78.0–100.0)
PLATELETS: 64 10*3/uL — AB (ref 150–400)
Platelets: 75 10*3/uL — ABNORMAL LOW (ref 150–400)
RBC: 3.27 MIL/uL — ABNORMAL LOW (ref 4.22–5.81)
RBC: 3.25 MIL/uL — ABNORMAL LOW (ref 4.22–5.81)
RDW: 15.6 % — ABNORMAL HIGH (ref 11.5–15.5)
RDW: 15.4 % (ref 11.5–15.5)
WBC: 9.2 10*3/uL (ref 4.0–10.5)
WBC: 7.7 10*3/uL (ref 4.0–10.5)

## 2014-04-15 LAB — BASIC METABOLIC PANEL
Anion gap: 11 (ref 5–15)
BUN: 15 mg/dL (ref 6–23)
CO2: 24 mEq/L (ref 19–32)
Calcium: 8.4 mg/dL (ref 8.4–10.5)
Chloride: 97 mEq/L (ref 96–112)
Creatinine, Ser: 0.6 mg/dL (ref 0.50–1.35)
GFR calc Af Amer: >90 mL/min (ref >90)
GFR calc non Af Amer: >90 mL/min (ref >90)
Glucose, Bld: 113 mg/dL — ABNORMAL HIGH (ref 70–99)
Potassium: 4 mEq/L (ref 3.7–5.3)
Sodium: 132 mEq/L — ABNORMAL LOW (ref 137–147)

## 2014-04-15 LAB — POCT I-STAT, CHEM 8
BUN: 11 mg/dL (ref 6–23)
Calcium, Ion: 1.17 mmol/L (ref 1.13–1.30)
Chloride: 101 meq/L (ref 96–112)
Creatinine, Ser: 0.7 mg/dL (ref 0.50–1.35)
Glucose, Bld: 106 mg/dL — ABNORMAL HIGH (ref 70–99)
HCT: 30 % — ABNORMAL LOW (ref 39.0–52.0)
Hemoglobin: 10.2 g/dL — ABNORMAL LOW (ref 13.0–17.0)
Potassium: 3.5 meq/L — ABNORMAL LOW (ref 3.7–5.3)
Sodium: 137 meq/L (ref 137–147)
TCO2: 22 mmol/L (ref 0–100)

## 2014-04-15 LAB — CLOSTRIDIUM DIFFICILE BY PCR: Toxigenic C. Difficile by PCR: NEGATIVE

## 2014-04-15 MED ORDER — FENTANYL CITRATE 0.05 MG/ML IJ SOLN
25.0000 ug | Freq: Once | INTRAMUSCULAR | Status: DC
  Administered 2014-04-15: 25 ug via INTRAVENOUS

## 2014-04-15 MED ORDER — BISACODYL 10 MG RE SUPP: 10.0000 mg | Freq: Every day | RECTAL | Status: DC | PRN

## 2014-04-15 MED ORDER — LIDOCAINE HCL (PF) 1 % IJ SOLN
INTRAMUSCULAR | Status: AC
  Administered 2014-04-15: 5 mL
  Filled 2014-04-15: qty 5

## 2014-04-15 MED ORDER — FUROSEMIDE 40 MG PO TABS
40.0000 mg | ORAL_TABLET | Freq: Every day | ORAL | Status: DC
  Administered 2014-04-15 – 2014-04-20 (×5): 40 mg via ORAL
  Filled 2014-04-15 (×6): qty 1

## 2014-04-15 MED ORDER — TRAMADOL HCL 50 MG PO TABS
50.0000 mg | ORAL_TABLET | Freq: Four times a day (QID) | ORAL | Status: DC | PRN
  Administered 2014-04-16: 50 mg via ORAL
  Filled 2014-04-15 (×2): qty 1

## 2014-04-15 MED ORDER — DOCUSATE SODIUM 100 MG PO CAPS: 200.0000 mg | ORAL_CAPSULE | Freq: Every day | ORAL | Status: DC | PRN

## 2014-04-15 MED ORDER — DIPHENOXYLATE-ATROPINE 2.5-0.025 MG PO TABS
1.0000 | ORAL_TABLET | Freq: Four times a day (QID) | ORAL | Status: DC | PRN
  Filled 2014-04-15: qty 1

## 2014-04-15 MED ORDER — LIDOCAINE HCL (PF) 1 % IJ SOLN: 10.0000 mL | Freq: Once | INTRAMUSCULAR | Status: DC

## 2014-04-15 MED ORDER — LACTINEX PO CHEW
1.0000 | CHEWABLE_TABLET | Freq: Two times a day (BID) | ORAL | Status: DC
  Administered 2014-04-15 – 2014-04-19 (×10): 1 via ORAL
  Filled 2014-04-15 (×16): qty 1

## 2014-04-15 MED ORDER — POTASSIUM CHLORIDE CRYS ER 20 MEQ PO TBCR
20.0000 meq | EXTENDED_RELEASE_TABLET | Freq: Every day | ORAL | Status: DC
  Administered 2014-04-15 – 2014-04-20 (×6): 20 meq via ORAL
  Filled 2014-04-15 (×7): qty 1

## 2014-04-15 MED ORDER — BISACODYL 5 MG PO TBEC: 10.0000 mg | DELAYED_RELEASE_TABLET | Freq: Every day | ORAL | Status: DC | PRN

## 2014-04-15 NOTE — Progress Notes (Addendum)
[address]       [place] [address]             [phone]      2 Days Post-Op Procedure(s) (LRB): AORTIC VALVE REPLACEMENT (AVR) (N/A)  Subjective:  [name] states he is not doing well this morning.  States he was up most of the night with diarrhea.  Objective: Vital signs in last 24 hours: Temp:  [98 F (36.7 C)-100.2 F (37.9 C)] 98.1 F (36.7 C) (09/10 0400) Pulse Rate:  [81-117] 97 (09/10 0700) Cardiac Rhythm:  [-] Normal sinus rhythm (09/10 0600) Resp:  [17-36] 22 (09/10 0600) BP: (103-148)/(51-90) 103/60 mmHg (09/10 0700) SpO2:  [94 %-100 %] 100 % (09/10 0700) Arterial Line BP: (120-165)/(45-61) 159/56 mmHg (09/09 1400) Weight:  [154 lb 6.4 oz (70.035 kg)] 154 lb 6.4 oz (70.035 kg) (09/10 0500)  Hemodynamic parameters for last 24 hours: PAP: (32-34)/(15-18) 34/18 mmHg  Intake/Output from previous day: 09/09 0701 - 09/10 0700 In: 1961.6 [I.V.:682.8; Blood:628.8; IV Piggyback:650] Out: 3870 [Urine:3870]  General appearance: alert, cooperative and no distress Heart: regular rate and rhythm Lungs: diminished breath sounds right base Abdomen: soft, non-tender; bowel sounds normal; no masses,  no organomegaly Extremities: edema minimal Wound: clean and dry  Lab Results:  Recent Labs  04/14/14 1700 04/14/14 1737 04/15/14 0445  WBC 8.5  --  9.2  HGB 10.5* 10.2* 10.0*  HCT 30.5* 30.0* 29.0*  PLT 64*  --  64*   BMET:  Recent Labs  04/14/14 0400  04/14/14 1737 04/15/14 0445  NA 140  --  137 132*  K 4.3  --  3.5* 4.0  CL 107  --  101 97  CO2 23  --   --  24  GLUCOSE 103*  --  106* 113*  BUN 12  --  11 15  CREATININE 0.74  < > 0.70 0.60  CALCIUM 8.4  --   --  8.4  < > = values in this interval not displayed.  PT/INR:  Recent Labs  04/13/14 1757  LABPROT 19.1*  INR 1.60*   ABG    Component Value Date/Time   PHART 7.363 04/14/2014 0145   HCO3 21.9 04/14/2014 0145   TCO2 22 04/14/2014 1737   ACIDBASEDEF 3.0* 04/14/2014  0145   O2SAT 98.0 04/14/2014 0145   CBG (last 3)   Recent Labs  04/14/14 1918 04/14/14 2346 04/15/14 0351  GLUCAP 155* 125* 121*    Assessment/Plan: S/P Procedure(s) (LRB): AORTIC VALVE REPLACEMENT (AVR) (N/A)  1. CV- NSR good rate and pressure control- can likely d/c Milrinone today, Lopressor ordered 2. Pulm- wean oxygen as tolerated, increasing right sided pleural effusion 3. Mediastinal Chest tubes -970 cc output yesterday, can likely remove chest tubes today 4. Renal- creatinine, lytes okay- weight is up 14 lbs, will start Lasix 5. Expected Acute Blood Loss Anemia- Hgb at 10.0 after transfusion will monitor 6. Thrombocytopenia- stable, continue to hold Heparin agents, monitor 7 DM- sugars are controlled, continue insulin for now, will likely be able to wean off next few days 8.Dispo- patient with diarrhea overnight- C. Diff sent- right pleural effusion needs chest tube placed, likely d/c mediastinal chest tubes, wean milrinone as tolerated    LOS: 7 days    [name] 04/15/2014  R chest tube placed for hemothorax/coagulopathy- 1.2 L out Keep in ICU today and check pm Hb Treat diarrhea patient examined and medical record reviewed,agree with above note. Jose TRIGT III,[name] 04/15/2014

## 2014-04-15 NOTE — Progress Notes (Signed)
     SUBJECTIVE: Diarrhea overnight. No chest pain or SOB  BP 103/60  Pulse 97  Temp(Src) 98.1 F (36.7 C) (Oral)  Resp 22  Ht 5\' 8"  (1.727 m)  Wt 154 lb 6.4 oz (70.035 kg)  BMI 23.48 kg/m2  SpO2 100%  Intake/Output Summary (Last 24 hours) at 04/15/14 0750 Last data filed at 04/15/14 0700  Gross per 24 hour  Intake 1761.56 ml  Output   3870 ml  Net -2108.44 ml    PHYSICAL EXAM General: Well developed, well nourished, in no acute distress. Alert and oriented x 3.  Psych:  Good affect, responds appropriately Neck: No JVD. No masses noted.  Lungs: Diminished breath sounds right base.   Heart: RRR with no murmurs noted. Abdomen: Bowel sounds are present. Soft, non-tender.  Extremities: No lower extremity edema.   LABS: Basic Metabolic Panel:  Recent Labs  04/14/14 0400 04/14/14 1700 04/14/14 1737 04/15/14 0445  NA 140  --  137 132*  K 4.3  --  3.5* 4.0  CL 107  --  101 97  CO2 23  --   --  24  GLUCOSE 103*  --  106* 113*  BUN 12  --  11 15  CREATININE 0.74 0.72 0.70 0.60  CALCIUM 8.4  --   --  8.4  MG 2.2 1.8  --   --    CBC:  Recent Labs  04/14/14 1700 04/14/14 1737 04/15/14 0445  WBC 8.5  --  9.2  HGB 10.5* 10.2* 10.0*  HCT 30.5* 30.0* 29.0*  MCV 89.2  --  88.7  PLT 64*  --  64*   Current Meds: . acetaminophen  1,000 mg Oral 4 times per day   Or  . acetaminophen (TYLENOL) oral liquid 160 mg/5 mL  1,000 mg Per Tube 4 times per day  . antiseptic oral rinse  7 mL Mouth Rinse QID  . bisacodyl  10 mg Oral Daily   Or  . bisacodyl  10 mg Rectal Daily  . docusate sodium  200 mg Oral Daily  . insulin aspart  0-24 Units Subcutaneous 6 times per day  . insulin detemir  10 Units Subcutaneous Daily  . metoprolol tartrate  25 mg Oral BID  . pantoprazole  40 mg Oral Daily  . pravastatin  40 mg Oral Daily  . sodium chloride  3 mL Intravenous Q12H     ASSESSMENT AND PLAN:  1. Severe aortic valve stenosis: Now s/p AVR. Continues to progress well. BP  stable on low dose milrinone. Can likely d/c this am. Plans for right chest tube per CT surgery team this am.    MCALHANY,CHRISTOPHER  9/10/20157:50 AM

## 2014-04-15 NOTE — Progress Notes (Signed)
POD # 2 AVR  Up in chair  BP 112/55  Pulse 87  Temp(Src) 98 F (36.7 C) (Oral)  Resp 25  Ht 5\' 8"  (1.727 m)  Wt 154 lb 6.4 oz (70.035 kg)  BMI 23.48 kg/m2  SpO2 99%   Intake/Output Summary (Last 24 hours) at 04/15/14 1720 Last data filed at 04/15/14 1600  Gross per 24 hour  Intake  895.2 ml  Output   4070 ml  Net -3174.8 ml   HCT= 29 PLT= 75- up from 64  Continue current care

## 2014-04-16 ENCOUNTER — Inpatient Hospital Stay (HOSPITAL_COMMUNITY): Payer: Medicare Other

## 2014-04-16 LAB — GLUCOSE, CAPILLARY
GLUCOSE-CAPILLARY: 75 mg/dL (ref 70–99)
GLUCOSE-CAPILLARY: 88 mg/dL (ref 70–99)
Glucose-Capillary: 88 mg/dL (ref 70–99)

## 2014-04-16 LAB — CBC
HEMATOCRIT: 27 % — AB (ref 39.0–52.0)
Hemoglobin: 9.1 g/dL — ABNORMAL LOW (ref 13.0–17.0)
MCH: 30.3 pg (ref 26.0–34.0)
MCHC: 33.7 g/dL (ref 30.0–36.0)
MCV: 90 fL (ref 78.0–100.0)
Platelets: 68 10*3/uL — ABNORMAL LOW (ref 150–400)
RBC: 3 MIL/uL — ABNORMAL LOW (ref 4.22–5.81)
RDW: 15.3 % (ref 11.5–15.5)
WBC: 4.7 10*3/uL (ref 4.0–10.5)

## 2014-04-16 LAB — BASIC METABOLIC PANEL
ANION GAP: 10 (ref 5–15)
BUN: 19 mg/dL (ref 6–23)
CHLORIDE: 101 meq/L (ref 96–112)
CO2: 26 mEq/L (ref 19–32)
Calcium: 7.9 mg/dL — ABNORMAL LOW (ref 8.4–10.5)
Creatinine, Ser: 0.61 mg/dL (ref 0.50–1.35)
GFR calc Af Amer: >90 mL/min (ref >90)
GFR calc non Af Amer: >90 mL/min (ref >90)
Glucose, Bld: 81 mg/dL (ref 70–99)
POTASSIUM: 4.3 meq/L (ref 3.7–5.3)
Sodium: 137 mEq/L (ref 137–147)

## 2014-04-16 MED ORDER — MOVING RIGHT ALONG BOOK
Freq: Once | Status: AC
  Administered 2014-04-16: 09:00:00
  Filled 2014-04-16: qty 1

## 2014-04-16 MED ORDER — SODIUM CHLORIDE 0.9 % IJ SOLN
3.0000 mL | INTRAMUSCULAR | Status: DC | PRN
Start: 2014-04-16 — End: 2014-04-20

## 2014-04-16 MED ORDER — SODIUM CHLORIDE 0.9 % IV SOLN: 250.0000 mL | INTRAVENOUS | Status: DC | PRN

## 2014-04-16 MED ORDER — FUROSEMIDE 10 MG/ML IJ SOLN
20.0000 mg | Freq: Once | INTRAMUSCULAR | Status: AC
  Administered 2014-04-16: 20 mg via INTRAVENOUS
  Filled 2014-04-16: qty 2

## 2014-04-16 MED ORDER — SODIUM CHLORIDE 0.9 % IJ SOLN
3.0000 mL | Freq: Two times a day (BID) | INTRAMUSCULAR | Status: DC
  Administered 2014-04-16: 10:00:00 via INTRAVENOUS
  Administered 2014-04-18: 3 mL via INTRAVENOUS

## 2014-04-16 MED ORDER — METFORMIN HCL 500 MG PO TABS
500.0000 mg | ORAL_TABLET | Freq: Every day | ORAL | Status: DC
  Administered 2014-04-17 – 2014-04-20 (×4): 500 mg via ORAL
  Filled 2014-04-16 (×6): qty 1

## 2014-04-16 NOTE — Progress Notes (Signed)
      [address]       [place] [address]             [phone]      3 Days Post-Op Procedure(s) (LRB): AORTIC VALVE REPLACEMENT (AVR) (N/A)  Subjective:  [name] is feeling better this morning.  He had one episode of diarrhea this morning, but states for the most part this is improving. + ambulation  Objective: Vital signs in last 24 hours: Temp:  [97.5 F (36.4 C)-99.3 F (37.4 C)] 98.3 F (36.8 C) (09/11 0754) Pulse Rate:  [75-109] 86 (09/11 0700) Cardiac Rhythm:  [-] Normal sinus rhythm (09/11 0730) Resp:  [16-32] 21 (09/11 0700) BP: (101-138)/(48-82) 112/55 mmHg (09/11 0700) SpO2:  [95 %-100 %] 95 % (09/11 0700) Weight:  [143 lb 14.4 oz (65.273 kg)] 143 lb 14.4 oz (65.273 kg) (09/11 0500)  Intake/Output from previous day: 09/10 0701 - 09/11 0700 In: 664.4 [P.O.:180; I.V.:484.4] Out: 2875 [Urine:1135; Chest Tube:1740] Intake/Output this shift: Total I/O In: -  Out: 55 [Urine:35; Chest Tube:20]  General appearance: alert, cooperative and no distress Heart: regular rate and rhythm Lungs: clear to auscultation bilaterally Abdomen: soft, non-tender; bowel sounds normal; no masses,  no organomegaly Extremities: edema none appreciated Wound: clean and dry  Lab Results:  Recent Labs  04/15/14 1554 04/16/14 0400  WBC 7.7 4.7  HGB 10.1* 9.1*  HCT 29.6* 27.0*  PLT 75* 68*   BMET:  Recent Labs  04/15/14 0445 04/16/14 0400  NA 132* 137  K 4.0 4.3  CL 97 101  CO2 24 26  GLUCOSE 113* 81  BUN 15 19  CREATININE 0.60 0.61  CALCIUM 8.4 7.9*    PT/INR:  Recent Labs  04/13/14 1757  LABPROT 19.1*  INR 1.60*   ABG    Component Value Date/Time   PHART 7.363 04/14/2014 0145   HCO3 21.9 04/14/2014 0145   TCO2 22 04/14/2014 1737   ACIDBASEDEF 3.0* 04/14/2014 0145   O2SAT 98.0 04/14/2014 0145   CBG (last 3)   Recent Labs  04/15/14 1937 04/15/14 2355 04/16/14 0410  GLUCAP 144* 88 88    Assessment/Plan: S/P Procedure(s)  (LRB): AORTIC VALVE REPLACEMENT (AVR) (N/A)  1. CV- NSR good rate and pressure control- continue Lopressor 2. Pulm- off oxygen, Chest tube no air leak, CXR shows improvement of right sided pleural effusion, will remove chest tube today 3. Renal- Creatinine, lytes okay- weight is at baseline, will give 2 more doses of Lasix 4. Expected Acute Blood Loss Anemia- remains stable, mild 5. Thrombocytopenia- plts dropped again today, not on Lovenox- after reviewing labs this appears to be chronic for the patient 6. DM- sugars remains well controlled, will d/c Levemir, restart home metformin, continue SSIP 7. GI- diarrhea resolving, C. Diff negative, continue to hold stool softners 8. Dispo- patient stable off all drips, will d/c chest tube and foley catheter today, possibly transfer to 2000 this afternoon   LOS: 8 days    Ahmed Prima, [name] 04/16/2014

## 2014-04-16 NOTE — Progress Notes (Signed)
     SUBJECTIVE: No complaints.   BP 112/55  Pulse 86  Temp(Src) 98.3 F (36.8 C) (Oral)  Resp 21  Ht 5\' 8"  (1.727 m)  Wt 143 lb 14.4 oz (65.273 kg)  BMI 21.89 kg/m2  SpO2 95%  Intake/Output Summary (Last 24 hours) at 04/16/14 0825 Last data filed at 04/16/14 0730  Gross per 24 hour  Intake  642.2 ml  Output   2890 ml  Net -2247.8 ml    PHYSICAL EXAM General: Well developed, well nourished, in no acute distress. Alert and oriented x 3.  Psych:  Good affect, responds appropriately Neck: No JVD. No masses noted.  Lungs: Clear bilaterally with no wheezes or rhonci noted.  Heart: RRR with no murmurs noted. Abdomen: Bowel sounds are present. Soft, non-tender.  Extremities: No lower extremity edema.   LABS: Basic Metabolic Panel:  Recent Labs  04/14/14 0400 04/14/14 1700  04/15/14 0445 04/16/14 0400  NA 140  --   < > 132* 137  K 4.3  --   < > 4.0 4.3  CL 107  --   < > 97 101  CO2 23  --   --  24 26  GLUCOSE 103*  --   < > 113* 81  BUN 12  --   < > 15 19  CREATININE 0.74 0.72  < > 0.60 0.61  CALCIUM 8.4  --   --  8.4 7.9*  MG 2.2 1.8  --   --   --   < > = values in this interval not displayed. CBC:  Recent Labs  04/15/14 1554 04/16/14 0400  WBC 7.7 4.7  HGB 10.1* 9.1*  HCT 29.6* 27.0*  MCV 91.1 90.0  PLT 75* 68*   Current Meds: . acetaminophen  1,000 mg Oral 4 times per day   Or  . acetaminophen (TYLENOL) oral liquid 160 mg/5 mL  1,000 mg Per Tube 4 times per day  . antiseptic oral rinse  7 mL Mouth Rinse QID  . furosemide  40 mg Oral Daily  . insulin aspart  0-24 Units Subcutaneous 6 times per day  . insulin detemir  10 Units Subcutaneous Daily  . lactobacillus acidophilus & bulgar  1 tablet Oral BID  . metoprolol tartrate  25 mg Oral BID  . pantoprazole  40 mg Oral Daily  . potassium chloride  20 mEq Oral Daily  . pravastatin  40 mg Oral Daily  . sodium chloride  3 mL Intravenous Q12H     ASSESSMENT AND PLAN:  1. Severe aortic valve  stenosis: s/p AVR. Continues to progress well. Off of all pressors. Diarrhea improving. C diff negative.   [name]  9/11/20158:25 AM

## 2014-04-16 NOTE — Discharge Instructions (Signed)
Aortic Valve Replacement, Care After °Refer to this sheet in the next few weeks. These instructions provide you with information on caring for yourself after your procedure. Your health care provider may also give you specific instructions. Your treatment has been planned according to current medical practices, but problems sometimes occur. Call your health care provider if you have any problems or questions after your procedure. °HOME CARE INSTRUCTIONS  °· Take medicines only as directed by your health care provider. °· If your health care provider has prescribed elastic stockings, wear them as directed. °· Take frequent naps or rest often throughout the day. °· Avoid lifting over 10 lbs (4.5 kg) or pushing or pulling things with your arms for 6-8 weeks or as directed by your health care provider. °· Avoid driving or airplane travel for 4-6 weeks after surgery or as directed by your health care provider. If you are riding in a car for an extended period, stop every 1-2 hours to stretch your legs. Keep a record of your medicines and medical history with you when traveling. °· Do not drive or operate heavy machinery while taking pain medicine. (narcotics). °· Do not cross your legs. °· Do not use any tobacco products including cigarettes, chewing tobacco, or electronic cigarettes. If you need help quitting, ask your health care provider. °· Do not take baths, swim, or use a hot tub until your health care provider approves. Take showers once your health care provider approves. Pat incisions dry. Do not rub incisions with a washcloth or towel. °· Avoid climbing stairs and using the handrail to pull yourself up for the first 2-3 weeks after surgery. °· Return to work as directed by your health care provider. °· Drink enough fluid to keep your urine clear or pale yellow. °· Do not strain to have a bowel movement. Eat high-fiber foods if you become constipated. You may also take a medicine to help you have a bowel  movement (laxative) as directed by your health care provider. °· Resume sexual activity as directed by your health care provider. Men should not use medicines for erectile dysfunction until their doctor says it is okay. °· If you had a certain type of heart condition in the past, you may need to take antibiotic medicine before having dental work or surgery. Let your dentist and health care providers know if you had one or more of the following: °¨ Previous endocarditis. °¨ An artificial (prosthetic) heart valve. °¨ Congenital heart disease. °SEEK MEDICAL CARE IF: °· You develop a skin rash.   °· You experience sudden changes in your weight. °· You have a fever. °SEEK IMMEDIATE MEDICAL CARE IF:  °· You develop chest pain that is not coming from your incision. °· You have drainage (pus), redness, swelling, or pain at your incision site.   °· You develop shortness of breath or have difficulty breathing.   °· You have increased bleeding from your incision site.   °· You develop light-headedness.   °MAKE SURE YOU:  °· Understand these directions. °· Will watch your condition. °· Will get help right away if you are not doing well or get worse. °Document Released: 02/08/2005 Document Revised: 12/07/2013 Document Reviewed: 05/06/2012 °[employer]® Patient Information ©2015 [employer]. This information is not intended to replace advice given to you by your health care provider. Make sure you discuss any questions you have with your health care provider. ° °

## 2014-04-16 NOTE — Op Note (Signed)
[name], [name] NO.:  [id number]  MEDICAL RECORD NO.:  [id number]  LOCATION:                                 FACILITY:  PHYSICIAN:  [name], M.D.  DATE OF BIRTH:  11-16-1940  DATE OF PROCEDURE:  04/15/2014 DATE OF DISCHARGE:                              OPERATIVE REPORT   OPERATION:  Placement of right chest tube.  PREOPERATIVE DIAGNOSIS:  Right pneumothorax following aortic valve replacement with coagulopathy.  POSTOPERATIVE DIAGNOSIS:  Right pneumothorax following aortic valve replacement with coagulopathy.  ANESTHESIA:  Local with 1% lidocaine.  PROCEDURE IN DETAIL:  The patient is 2 days postop aortic valve replacement for severe aortic stenosis with underlying cardiomyopathy. He had perioperative coagulopathy and required blood component therapy. His x-ray this morning shows a significant new right effusion consistent with probable hemothorax.  The patient has been stable.  I discussed placement of the right chest tube with the patient obtained informed consent.  The patient was placed supine in his ICU bed.  The right chest was prepped and draped as a sterile field.  A proper time-out was performed. Lidocaine 1% was infiltrated in the fifth interspace anterior axillary line.  The patient was given some IV fentanyl and Versed and monitored. The incision was made in the fifth interspace.  More lidocaine was infiltrated on the intercostal muscle.  Using hemostat, the right pleural space was entered and blood immediately exited.  Through the trach at 28-French chest tube was placed and directed to the base of the pleural space for dependent drainage.  This connected to an underwater seal Pleur-Evac and a suture was used to secure the tube.  A sterile dressing was applied.  The patient tolerated the procedure well.  A followup chest x-ray showed complete drainage of pleural effusion without pneumothorax.     [name],  M.D.     PV/MEDQ  D:  04/15/2014  T:  04/16/2014  Job:  [id number]

## 2014-04-16 NOTE — Discharge Summary (Signed)
Physician Discharge Summary  Patient ID: [name] MRN: [id number] DOB/AGE: November 21, 1940 73 y.o.  Admit date: 04/08/2014 Discharge date: 04/20/2014  Admission Diagnoses:  Patient Active Problem List   Diagnosis Date Noted  . Aortic stenosis 04/08/2014  . Chronic systolic heart failure 46/56/8127  . Unstable angina 04/08/2014  . Chest pain 04/08/2014  . Personal history of colonic polyps 11/26/2013  . Loss of weight 11/02/2013  . AORTIC STENOSIS/ INSUFFICIENCY, NON-RHEUMATIC 07/11/2010  . LBBB 07/11/2010  . CAROTID BRUIT 07/11/2010  . NEOPLASM OF UNCERTAIN BEHAVIOR OF SKIN 09/27/2008  . BENIGN PROSTATIC HYPERTROPHY, WITH OBSTRUCTION 09/27/2008  . KNEE PAIN 09/27/2008  . FLATULENCE ERUCTATION AND GAS PAIN 04/06/2008  . ALLERGIC RHINITIS 01/09/2008  . CONSTIPATION 10/17/2007  . HEADACHE 07/25/2007  . LEG CRAMPS 02/13/2007  . DYSPNEA ON EXERTION 01/10/2007  . DIABETES MELLITUS, TYPE II 10/10/2006  . HYPERLIPIDEMIA 10/10/2006  . HYPERTENSION 10/10/2006  . GERD 10/10/2006  . CARDIAC MURMUR 10/10/2006  . CHEST PAIN, EXERTIONAL 10/10/2006   Discharge Diagnoses:   Patient Active Problem List   Diagnosis Date Noted  . Cardiomyopathy, dilated, nonischemic 04/18/2014  . Postoperative atrial fibrillation 04/18/2014  . 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  . Aortic stenosis 04/08/2014  . Chronic systolic heart failure 51/70/0174  . Unstable angina 04/08/2014  . Chest pain 04/08/2014  . Personal history of colonic polyps 11/26/2013  . Loss of weight 11/02/2013  . AORTIC STENOSIS/ INSUFFICIENCY, NON-RHEUMATIC 07/11/2010  . LBBB 07/11/2010  . CAROTID BRUIT 07/11/2010  . NEOPLASM OF UNCERTAIN BEHAVIOR OF SKIN 09/27/2008  . BENIGN PROSTATIC HYPERTROPHY, WITH OBSTRUCTION 09/27/2008  . KNEE PAIN 09/27/2008  . FLATULENCE ERUCTATION AND GAS PAIN 04/06/2008  . ALLERGIC RHINITIS 01/09/2008  . CONSTIPATION 10/17/2007  . HEADACHE 07/25/2007  . LEG CRAMPS 02/13/2007  .  DYSPNEA ON EXERTION 01/10/2007  . DIABETES MELLITUS, TYPE II 10/10/2006  . HYPERLIPIDEMIA 10/10/2006  . HYPERTENSION 10/10/2006  . GERD 10/10/2006  . CARDIAC MURMUR 10/10/2006  . CHEST PAIN, EXERTIONAL 10/10/2006   Discharged Condition: good  History of Present Illness:   [name] is a 73 yo white male with previous history of smoking and Aortic Stenosis who presented to the ED with complaints of chest pain.  The patient states that he has actually had chest pain for about 2 years.  However, the morning of presentation the episode gradually worsened prompting the patient to seek evaluation.  This was also accompanied by shortness of breath and nausea.  He was transferred to [facility] for further workup.  He was ruled out for acute myocardial infarction.  It was felt he should undergo cardiac catheterization to assess his coronary arteries.  His recent Echocardiogram was reviewed and confirmed severe aortic stenosis that would likely require surgical intervention.  He underwent catheterization which did not show evidence of obstructive coronary artery disease.  Therefore, [facility]CTS was consulted for Aortic Valve Replacement.  Hospital Course:   He was evaluated by Dr. Prescott Travis who was in agreement the patient would benefit from Aortic Valve Replacement.  The risks and benefits of the procedure were explained to the patient and he was agreeable to proceed.  The patient was taken to the operating room on 04/13/2014.  He underwent Aortic Valve Replacement utilizing a 21 mm [employer] Ease Pericardial Tissue Valve.  He tolerated the procedure well and was taken to the SICU in stable condition.  During his stay in the SICU the patient was extubated overnight.  He had some  coagulopathy as well that was treated with DDAVP and platelets over night.  He was weaned off Dopamine, Neo Synephrine and Milrinone as tolerated.  He developed expected post operative blood loss with a Hgb at 7.1  and he required 2 units of packed cells.  The patient developed right sided pleural effusion and a chest tube was placed with successful evacuation.  He also developed diarrhea.  C. Diff toxin was obtained and was found to be negative.  He was felt to likely have an Ileus and was left on a full liquid diet.  The patient has continued to make progress.  All his chest tubes and lines were removed without difficulty.  He is maintaining NSR and felt medically stable for transfer to the step down unit.  He had a brief run of a fib on 04/18/2014. He was given Magnesium. His Lopressor was increased. He then had PVCs. Again, his beta blocker was increased. He had a bowel movement. His epicardial pacing wires and chest tube sutures were removed prior to discharge. He did have a fever to 100.3 the evening of 9/14. This is likely related to atelectasis as he has no wound infection and no GU complaints. He is going to be discharged today.  Significant Diagnostic Studies: cardiac graphics:   Echocardiogram:  - Left ventricle: The cavity size was normal. Systolic function was severely reduced. The estimated ejection fraction was in the range of 25% to 30%. Diffuse hypokinesis. Doppler parameters are consistent with abnormal left ventricular relaxation (grade 1 diastolic dysfunction). Doppler parameters are consistent with high ventricular filling pressure. - Aortic valve: There was moderate to severe stenosis (likely underestimated secondary to decreased EF). There was trivial regurgitation. Peak velocity (S): 376 cm/s. Mean gradient (S): 39 mm Hg. - Mitral valve: Calcified annulus. Mildly thickened leaflets . There was mild regurgitation. - Left atrium: The atrium was mildly dilated.  Treatments: surgery:   Aortic valve replacement [facility] Ease valve serial number [id number], 21 mm).  Disposition: 01-Home or Self Care  Discharge Medications:    Medication List    STOP taking these medications        benazepril-hydrochlorthiazide 10-12.5 MG per tablet  Commonly known as:  LOTENSIN HCT     metoprolol succinate 25 MG 24 hr tablet  Commonly known as:  TOPROL-XL      TAKE these medications       aspirin 81 MG EC tablet  Take 1 tablet (81 mg total) by mouth daily. Swallow whole.     feeding supplement (ENSURE COMPLETE) Liqd  Take 237 mLs by mouth 2 (two) times daily between meals.     ferrous [id number] C-folic acid capsule  Commonly known as:  TRINSICON / FOLTRIN  Take 1 capsule by mouth daily. For one month then stop.     fish oil-omega-3 fatty acids 1000 MG capsule  Take 1 g by mouth daily.     furosemide 40 MG tablet  Commonly known as:  LASIX  Take 1 tablet (40 mg total) by mouth daily. For 4 days then stop.     lisinopril 2.5 MG tablet  Commonly known as:  PRINIVIL,ZESTRIL  Take 1 tablet (2.5 mg total) by mouth daily.     metFORMIN 500 MG tablet  Commonly known as:  GLUCOPHAGE  TAKE 1 TABLET BY MOUTH EVERY MORNING     metoprolol 50 MG tablet  Commonly known as:  LOPRESSOR  Take 1 tablet (50 mg total) by mouth 2 (two) times daily.  potassium chloride SA 20 MEQ tablet  Commonly known as:  K-DUR,KLOR-CON  Take 1 tablet (20 mEq total) by mouth daily. For 4 days then stop.     pravastatin 40 MG tablet  Commonly known as:  PRAVACHOL  TAKE 1 TABLET (40 MG TOTAL) BY MOUTH DAILY.     traMADol 50 MG tablet  Commonly known as:  ULTRAM  Take 1 tablet (50 mg total) by mouth every 6 (six) hours as needed for severe pain.       The patient has been discharged on:   1.Beta Blocker:  Yes [ x  ]                              No   [   ]                              If No, reason:  2.Ace Inhibitor/ARB: Yes [ x  ]                                     No  [    ]                                     If No, reason:   3.Statin:   Yes [ x  ]                  No  [   ]                  If No, reason:  4.Ecasa:  Yes  [ x  ]                  No   [   ]                   If No, reason:     Follow-up Information   Follow up with VAN Wilber Oliphant, MD On 05/19/2014. (Appointment is at 2:00)    Specialty:  Cardiothoracic Surgery   Contact information:   [place] [address] [phone]       Follow up with [facility] On 05/19/2014. (Please get CXR at 1:00, located on the first floor of [facility])    Contact information:   [place]       Follow up with Jory Sims, NP On 04/29/2014. (Appointment is at 2:30)    Specialty:  Nurse Practitioner   Contact information:   [address] [place] [address] [phone]       Follow up with Jory Sims, NP On 04/26/2014. (Please get lab work done at the [place] the street from the office anytime between [place] )    Specialty:  Hydrologist information:   [address] [place]  [address] [phone]       Follow up with [facility] On 04/23/2014. (Please remove chest tube suture on 04/23/2014)       Signed: ZIMMERMAN,DONIELLE M PA-C 04/20/2014, 11:01 AM

## 2014-04-17 ENCOUNTER — Inpatient Hospital Stay (HOSPITAL_COMMUNITY): Payer: Medicare Other

## 2014-04-17 LAB — BASIC METABOLIC PANEL
ANION GAP: 10 (ref 5–15)
ANION GAP: 10 (ref 5–15)
BUN: 22 mg/dL (ref 6–23)
BUN: 18 mg/dL (ref 6–23)
CHLORIDE: 101 meq/L (ref 96–112)
CHLORIDE: 100 meq/L (ref 96–112)
CO2: 27 meq/L (ref 19–32)
CO2: 28 meq/L (ref 19–32)
CREATININE: 0.71 mg/dL (ref 0.50–1.35)
Calcium: 8.6 mg/dL (ref 8.4–10.5)
Calcium: 8.4 mg/dL (ref 8.4–10.5)
Creatinine, Ser: 0.75 mg/dL (ref 0.50–1.35)
GFR calc Af Amer: >90 mL/min (ref >90)
GFR calc Af Amer: >90 mL/min (ref >90)
GFR calc non Af Amer: 88 mL/min — ABNORMAL LOW (ref >90)
GFR calc non Af Amer: 90 mL/min — ABNORMAL LOW (ref >90)
Glucose, Bld: 93 mg/dL (ref 70–99)
Glucose, Bld: 123 mg/dL — ABNORMAL HIGH (ref 70–99)
POTASSIUM: 4 meq/L (ref 3.7–5.3)
POTASSIUM: 4 meq/L (ref 3.7–5.3)
Sodium: 138 mEq/L (ref 137–147)
Sodium: 138 mEq/L (ref 137–147)

## 2014-04-17 LAB — CBC
HEMATOCRIT: 28.1 % — AB (ref 39.0–52.0)
Hemoglobin: 9.6 g/dL — ABNORMAL LOW (ref 13.0–17.0)
MCH: 31.4 pg (ref 26.0–34.0)
MCHC: 34.2 g/dL (ref 30.0–36.0)
MCV: 91.8 fL (ref 78.0–100.0)
Platelets: 89 10*3/uL — ABNORMAL LOW (ref 150–400)
RBC: 3.06 MIL/uL — AB (ref 4.22–5.81)
RDW: 14.7 % (ref 11.5–15.5)
WBC: 4.1 10*3/uL (ref 4.0–10.5)

## 2014-04-17 LAB — VON WILLEBRAND PANEL
Coagulation Factor VIII: 157 % — ABNORMAL HIGH (ref 73–140)
Ristocetin Co-factor, Plasma: 194 % (ref 42–200)
Von Willebrand Antigen, Plasma: 191 % (ref 50–217)

## 2014-04-17 MED ORDER — INFLUENZA VAC SPLIT QUAD 0.5 ML IM SUSY: 0.5000 mL | PREFILLED_SYRINGE | INTRAMUSCULAR | Status: DC | PRN

## 2014-04-17 NOTE — Progress Notes (Signed)
Notified PA for SVT of 143; 18 beat run; orders to get mag and bmet drawn in am; ordered placed.  Rowe Pavy, RN

## 2014-04-17 NOTE — Progress Notes (Signed)
CARDIAC REHAB PHASE I   PRE:  Rate/Rhythm: 85 sinus  BP:  Supine: 114/56  Sitting:   Standing:    SaO2:   MODE:  Ambulation: 550 ft   POST:  Rate/Rhythem: 99 sinus  BP:  Supine:   Sitting: 126/64  Standing:    SaO2:   Pt ambulated 550 ft with assist x1 using rolling walker.  Pt tolerated walk well without complaint.  Pt stated to d/c Sun/Mon so completed d/c education: diet, exercise, restrictions, ISP use, when to call 911/MD, sternal care.  Also discussed Cardiac Rehab Phase II with request to have info sent to [facility].  Pt and wife voiced understanding and completed teach back.  Pt returned to bed with call bell in reach and encouraged to walk over weekend.   [name]

## 2014-04-17 NOTE — Progress Notes (Addendum)
[address]       [place] [address]             [phone]      4 Days Post-Op Procedure(s) (LRB): AORTIC VALVE REPLACEMENT (AVR) (N/A) Subjective: No specific c/o, no further diarrhea  Objective: Vital signs in last 24 hours: Temp:  [97.7 F (36.5 C)-99.1 F (37.3 C)] 98.3 F (36.8 C) (09/12 0508) Pulse Rate:  [80-98] 83 (09/12 0508) Cardiac Rhythm:  [-] Normal sinus rhythm;Junctional rhythm;Bundle branch block (09/11 1945) Resp:  [18-21] 18 (09/12 0508) BP: (106-141)/(47-98) 113/58 mmHg (09/12 0508) SpO2:  [93 %-100 %] 93 % (09/12 0508) Weight:  [145 lb 8 oz (65.998 kg)] 145 lb 8 oz (65.998 kg) (09/12 0508)  Hemodynamic parameters for last 24 hours:    Intake/Output from previous day: 09/11 0701 - 09/12 0700 In: 320 [P.O.:300; I.V.:20] Out: 865 [Urine:835; Chest Tube:30] Intake/Output this shift:    General appearance: alert, cooperative and no distress Heart: regular rate and rhythm and frequent extrasystole Lungs: mildly dim in bases Abdomen: + BS, soft, non-tender Extremities: no edema Wound: incis healing well  Lab Results:  Recent Labs  04/15/14 1554 04/16/14 0400  WBC 7.7 4.7  HGB 10.1* 9.1*  HCT 29.6* 27.0*  PLT 75* 68*   BMET:  Recent Labs  04/15/14 0445 04/16/14 0400  NA 132* 137  K 4.0 4.3  CL 97 101  CO2 24 26  GLUCOSE 113* 81  BUN 15 19  CREATININE 0.60 0.61  CALCIUM 8.4 7.9*    PT/INR: No results found for this basename: LABPROT, INR,  in the last 72 hours ABG    Component Value Date/Time   PHART 7.363 04/14/2014 0145   HCO3 21.9 04/14/2014 0145   TCO2 22 04/14/2014 1737   ACIDBASEDEF 3.0* 04/14/2014 0145   O2SAT 98.0 04/14/2014 0145   CBG (last 3)   Recent Labs  04/15/14 2355 04/16/14 0410 04/16/14 0752  GLUCAP 88 88 75    Meds Scheduled Meds: . acetaminophen  1,000 mg Oral 4 times per day   Or  . acetaminophen (TYLENOL) oral liquid 160 mg/5 mL  1,000 mg Per Tube 4 times per day  . furosemide   40 mg Oral Daily  . lactobacillus acidophilus & bulgar  1 tablet Oral BID  . metFORMIN  500 mg Oral Q breakfast  . metoprolol tartrate  25 mg Oral BID  . pantoprazole  40 mg Oral Daily  . potassium chloride  20 mEq Oral Daily  . pravastatin  40 mg Oral Daily  . sodium chloride  3 mL Intravenous Q12H  . sodium chloride  3 mL Intravenous Q12H   Continuous Infusions:  PRN Meds:.sodium chloride, diphenoxylate-atropine, docusate sodium, metoprolol, ondansetron (ZOFRAN) IV, oxyCODONE, sodium chloride, sodium chloride, traMADol  Xrays Dg Chest 2 View  04/16/2014   CLINICAL DATA:  Chest pain  EXAM: CHEST  2 VIEW  COMPARISON:  04/15/2014  FINDINGS: Right chest tube is again identified and stable. No pneumothorax is seen. No recurrent effusion is noted. The left lung remains clear. The knee cardiac shadow is stable. A right jugular sheath remains in place.  IMPRESSION: No recurrent pneumothorax or pleural effusion is noted.   Electronically Signed   By: Inez Catalina M.D.   On: 04/16/2014 07:27   Dg Chest Port 1 View  04/15/2014   CLINICAL DATA:  73 year old male, status post aortic valve replacement.  EXAM: PORTABLE CHEST - 1 VIEW  COMPARISON:  04/15/2014 6:33 a.m., 04/14/2014, 04/13/2014, 04/08/2014, chest CT 12/09/2013  FINDINGS: Tubes/lines:  Interval placement of right-sided large bore thoracostomy tube with the tip terminating at the cardiophrenic angle. Unchanged position of right internal jugular sheath. Percutaneous cardiac pacing leads are visualized at the lower chest/upper abdomen. Overlying EKG leads.  Improved visualization at the right base status post chest tube placement. Partial obscuration left hemidiaphragm. Improved right lung aeration. Lung volumes remain low.  Cardiomediastinal silhouette unchanged. Calcifications of the aortic arch. Surgical changes of aortic valve and median sternotomy again noted.  No visualized pneumothorax.  No visualized displaced acute fracture.  Unremarkable  appearance of the upper abdomen.  IMPRESSION: Interval placement of large bore right-sided thoracostomy tube with the tip terminating at the cardiophrenic angle, with decreased right pleural fluid and improved right-sided aeration.  Lung volumes remain low with persisting atelectasis and left basilar opacity, potentially representing small pleural fluid.  Redemonstration of surgical changes of median sternotomy and aortic valve replacement.  Unchanged position of right internal jugular sheath.  These results were called by telephone at the time of interpretation on 04/15/2014 at 10:15 am to Ms Richardean Sale, who verbally acknowledged these results.  Signed,  Dulcy Fanny. Earleen Newport, DO  [facility]  [facility]   Electronically Signed   By: Corrie Mckusick O.D.   On: 04/15/2014 10:18    Assessment/Plan: S/P Procedure(s) (LRB): AORTIC VALVE REPLACEMENT (AVR) (N/A)  1 frequent pvc's including bigeminny- will recheck lytes including Mg++, cont current beta blocker dose for now 2 sugars controlled on metformin 3 BP well controlled with one reading syst>140- will hold on further rx at this time but may be able to tol low dose ACE inhibitor. preop efx 25-30% will cont diuretic for now 4 push rehab/ pulm toilet per routine protocols  LOS: 9 days    GOLD,WAYNE E 04/17/2014  Pat wants to go home Sunday Walked in hall today  I have seen and examined Katheren Shams and agree with the above assessment  and plan.  Grace Isaac MD Beeper [phone] Office [phone] 04/17/2014 4:02 PM

## 2014-04-18 DIAGNOSIS — I4891 Unspecified atrial fibrillation: Secondary | ICD-10-CM | POA: Diagnosis not present

## 2014-04-18 DIAGNOSIS — I428 Other cardiomyopathies: Secondary | ICD-10-CM

## 2014-04-18 DIAGNOSIS — I420 Dilated cardiomyopathy: Secondary | ICD-10-CM | POA: Diagnosis present

## 2014-04-18 DIAGNOSIS — I9789 Other postprocedural complications and disorders of the circulatory system, not elsewhere classified: Secondary | ICD-10-CM | POA: Diagnosis not present

## 2014-04-18 LAB — GLUCOSE, CAPILLARY
GLUCOSE-CAPILLARY: 146 mg/dL — AB (ref 70–99)
Glucose-Capillary: 77 mg/dL (ref 70–99)

## 2014-04-18 LAB — MAGNESIUM: Magnesium: 1.9 mg/dL (ref 1.5–2.5)

## 2014-04-18 MED ORDER — METOPROLOL TARTRATE 25 MG PO TABS
37.5000 mg | ORAL_TABLET | Freq: Two times a day (BID) | ORAL | Status: DC
  Administered 2014-04-18 (×2): 37.5 mg via ORAL
  Filled 2014-04-18 (×5): qty 1

## 2014-04-18 MED ORDER — MAGNESIUM OXIDE 400 (241.3 MG) MG PO TABS
400.0000 mg | ORAL_TABLET | Freq: Two times a day (BID) | ORAL | Status: DC
  Administered 2014-04-18 – 2014-04-20 (×5): 400 mg via ORAL
  Filled 2014-04-18 (×7): qty 1

## 2014-04-18 NOTE — Progress Notes (Addendum)
SUBJECTIVE:  No complaints  OBJECTIVE:   Vitals:   Filed Vitals:   04/17/14 0508 04/17/14 1429 04/17/14 2055 04/18/14 0403  BP: 113/58 120/54 118/52 104/57  Pulse: 83 75 90 79  Temp: 98.3 F (36.8 C) 98.5 F (36.9 C) 98.8 F (37.1 C) 98.7 F (37.1 C)  TempSrc: Oral Oral Oral Oral  Resp: 18 17 16 18   Height:      Weight: 145 lb 8 oz (65.998 kg)   143 lb 11.2 oz (65.182 kg)  SpO2: 93% 97% 93% 93%   I&O's:   Intake/Output Summary (Last 24 hours) at 04/18/14 0930 Last data filed at 04/18/14 6440  Gross per 24 hour  Intake    720 ml  Output      0 ml  Net    720 ml   TELEMETRY: Reviewed telemetry pt in NSR with frequent PVC's and short burst of PAF yesterday around 4pm     PHYSICAL EXAM General: Well developed, well nourished, in no acute distress Head: Eyes PERRLA, No xanthomas.   Normal cephalic and atramatic  Lungs:   Clear bilaterally to auscultation and percussion. Heart:   HRRR S1 S2 Pulses are 2+ & equal. Abdomen: Bowel sounds are positive, abdomen soft and non-tender without masses  Extremities:   No clubbing, cyanosis or edema.  DP +1 Neuro: Alert and oriented X 3. Psych:  Good affect, responds appropriately   LABS: Basic Metabolic Panel:  Recent Labs  04/17/14 0720 04/17/14 1856 04/18/14 0655  NA 138 138  --   K 4.0 4.0  --   CL 101 100  --   CO2 27 28  --   GLUCOSE 93 123*  --   BUN 22 18  --   CREATININE 0.75 0.71  --   CALCIUM 8.6 8.4  --   MG  --   --  1.9   Liver Function Tests: No results found for this basename: AST, ALT, ALKPHOS, BILITOT, PROT, ALBUMIN,  in the last 72 hours No results found for this basename: LIPASE, AMYLASE,  in the last 72 hours CBC:  Recent Labs  04/16/14 0400 04/17/14 0720  WBC 4.7 4.1  HGB 9.1* 9.6*  HCT 27.0* 28.1*  MCV 90.0 91.8  PLT 68* 89*   Cardiac Enzymes: No results found for this basename: CKTOTAL, CKMB, CKMBINDEX, TROPONINI,  in the last 72 hours BNP: No components found with this basename:  POCBNP,  D-Dimer: No results found for this basename: DDIMER,  in the last 72 hours Hemoglobin A1C: No results found for this basename: HGBA1C,  in the last 72 hours Fasting Lipid Panel: No results found for this basename: CHOL, HDL, LDLCALC, TRIG, CHOLHDL, LDLDIRECT,  in the last 72 hours Thyroid Function Tests: No results found for this basename: TSH, T4TOTAL, FREET3, T3FREE, THYROIDAB,  in the last 72 hours Anemia Panel: No results found for this basename: VITAMINB12, FOLATE, FERRITIN, TIBC, IRON, RETICCTPCT,  in the last 72 hours Coag Panel:   Lab Results  Component Value Date   INR 1.60* 04/13/2014   INR 1.82* 04/13/2014   INR 1.23 04/09/2014    RADIOLOGY: Dg Orthopantogram  04/10/2014   CLINICAL DATA:  Preop AVR  EXAM: ORTHOPANTOGRAM/PANORAMIC  COMPARISON:  None.  FINDINGS: Patient is edentulous.  No tooth fragment/remnant or apical lucency is visualized.  No fracture is seen.  IMPRESSION: Patient is edentulous.   Electronically Signed   By: Julian Hy M.D.   On: 04/10/2014 08:14   Dg Chest 2  View  04/17/2014   CLINICAL DATA:  Chest tube removal, right pleural effusion  EXAM: CHEST  2 VIEW  COMPARISON:  04/16/2014  FINDINGS: For chest tube is been removed. No pneumothorax. Stable mild cardiac enlargement. Stable mild medial left lung base opacity likely atelectasis.  IMPRESSION: No pneumothorax status post chest tube removal.   Electronically Signed   By: Skipper Cliche M.D.   On: 04/17/2014 08:40   Dg Chest 2 View  04/16/2014   CLINICAL DATA:  Chest pain  EXAM: CHEST  2 VIEW  COMPARISON:  04/15/2014  FINDINGS: Right chest tube is again identified and stable. No pneumothorax is seen. No recurrent effusion is noted. The left lung remains clear. The knee cardiac shadow is stable. A right jugular sheath remains in place.  IMPRESSION: No recurrent pneumothorax or pleural effusion is noted.   Electronically Signed   By: Inez Catalina M.D.   On: 04/16/2014 07:27   Dg Chest 2  View  04/08/2014   CLINICAL DATA:  Chest pain  EXAM: CHEST  2 VIEW  COMPARISON:  11/11/1998 day  FINDINGS: No cardiomegaly. Stable mediastinal contours. Posterior mediastinal widening above and at the diaphragm level correlates with bulky degenerative endplate spurs. There is no edema, consolidation, effusion, or pneumothorax. Fusion of three lower thoracic vertebrae. No osseous findings to explain chest pain.  IMPRESSION: No active cardiopulmonary disease.   Electronically Signed   By: Jorje Guild M.D.   On: 04/08/2014 12:39   Dg Chest Port 1 View  04/15/2014   CLINICAL DATA:  73 year old male, status post aortic valve replacement.  EXAM: PORTABLE CHEST - 1 VIEW  COMPARISON:  04/15/2014 6:33 a.m., 04/14/2014, 04/13/2014, 04/08/2014, chest CT 12/09/2013  FINDINGS: Tubes/lines:  Interval placement of right-sided large bore thoracostomy tube with the tip terminating at the cardiophrenic angle. Unchanged position of right internal jugular sheath. Percutaneous cardiac pacing leads are visualized at the lower chest/upper abdomen. Overlying EKG leads.  Improved visualization at the right base status post chest tube placement. Partial obscuration left hemidiaphragm. Improved right lung aeration. Lung volumes remain low.  Cardiomediastinal silhouette unchanged. Calcifications of the aortic arch. Surgical changes of aortic valve and median sternotomy again noted.  No visualized pneumothorax.  No visualized displaced acute fracture.  Unremarkable appearance of the upper abdomen.  IMPRESSION: Interval placement of large bore right-sided thoracostomy tube with the tip terminating at the cardiophrenic angle, with decreased right pleural fluid and improved right-sided aeration.  Lung volumes remain low with persisting atelectasis and left basilar opacity, potentially representing small pleural fluid.  Redemonstration of surgical changes of median sternotomy and aortic valve replacement.  Unchanged position of right  internal jugular sheath.  These results were called by telephone at the time of interpretation on 04/15/2014 at 10:15 am to Ms Richardean Sale, who verbally acknowledged these results.  Signed,  Dulcy Fanny. Earleen Newport, DO  [facility]  [facility]   Electronically Signed   By: Corrie Mckusick O.D.   On: 04/15/2014 10:18   Dg Chest Port 1 View  04/15/2014   CLINICAL DATA:  Followup atelectasis and right pleural effusion.  EXAM: PORTABLE CHEST - 1 VIEW  COMPARISON:  Portable chest x-rays yesterday and 04/13/2014. Two-view chest x-ray 04/08/2014.  FINDINGS: Sternotomy for CABG. Cardiac silhouette upper normal in size for technique. Interval increase in the moderate sized right pleural effusion. Worsening aeration at the right lung base, with dense consolidation in the right lower lobe. Worsening aeration in the left lung base.  Pulmonary vascularity normal without evidence of pulmonary edema. Interval Swan-Ganz catheter removal. Right jugular introducer sheath tip projects over the upper SVC.  IMPRESSION: Support apparatus satisfactory. Enlarging right pleural effusion and increasing atelectasis and/or pneumonia in the right lower lobe. Mild left basilar atelectasis.   Electronically Signed   By: Evangeline Dakin M.D.   On: 04/15/2014 07:57   Dg Chest Portable 1 View In Am  04/14/2014   CLINICAL DATA:  Coronary artery disease.  Aortic valve replacement.  EXAM: PORTABLE CHEST - 1 VIEW  COMPARISON:  04/13/2014  FINDINGS: Changes of CABG and valve replacement. Interval extubation and removal of NG tube. Increasing bibasilar opacities, likely atelectasis, right greater than left. Small right pleural effusion. No pneumothorax. Swan-Ganz catheter is in stable position.  IMPRESSION: Worsening bibasilar atelectasis following extubation. Small right pleural effusion.   Electronically Signed   By: Rolm Baptise M.D.   On: 04/14/2014 08:07   Dg Chest Portable 1 View  04/13/2014   CLINICAL  DATA:  Postop  EXAM: PORTABLE CHEST - 1 VIEW  COMPARISON:  04/08/2014  FINDINGS: Endotracheal tube appropriately positioned, 5.1 cm above chronic. Nasogastric tube extends beyond the inferior aspect of the film. Right IJ Swan-Ganz catheter with tip at pulmonary outflow tract. Interval median sternotomy. Aortic valve repair.  Midline trachea. Normal heart size. Atherosclerosis in the transverse aorta. No pleural effusion or pneumothorax. mild atelectasis in the left mid lung.  IMPRESSION: Appropriate position of support apparatus.  Mild volume loss and subsegmental atelectasis in the left mid lung.   Electronically Signed   By: Abigail Miyamoto M.D.   On: 04/13/2014 14:01   ASSESSMENT AND PLAN:  1.  Severe aortic valve stenosis: s/p AVR. Continues to progress well.  2.  Nonobstructive ASCAD - add low dose ASA 3.  Moderate to severe LV dysfunction EF 35% with normal LVEDP at cath.  Continue Lasix/BB.  Consider at some point adding low dose ACE I as BP allows 4.  PAF short burst- CVTS called [place] for nonsustained VTACH that occurred yesterday but strip reviewed and is not VT.  It appears to be a short burst of afib.  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  PO magnesium ordered for today.  Potassium ok.  BP soft this am so does not allow uptitration of BB.  Will continue to monitor further after Mg repleted.  May need to add Amio if he has further episodes. 5.  Type II DM - followup per PCP  Sueanne Margarita, MD  04/18/2014  9:30 AM

## 2014-04-18 NOTE — Progress Notes (Addendum)
[address]       [place] [address]             [phone]      5 Days Post-Op Procedure(s) (LRB): AORTIC VALVE REPLACEMENT (AVR) (N/A) Subjective: Feels ok, conts to progress nicely with rehab  Objective: Vital signs in last 24 hours: Temp:  [98.5 F (36.9 C)-98.8 F (37.1 C)] 98.7 F (37.1 C) (09/13 0403) Pulse Rate:  [75-90] 79 (09/13 0403) Cardiac Rhythm:  [-] Normal sinus rhythm;Bundle branch block (09/13 0403) Resp:  [16-18] 18 (09/13 0403) BP: (104-120)/(52-57) 104/57 mmHg (09/13 0403) SpO2:  [93 %-97 %] 93 % (09/13 0403) Weight:  [143 lb 11.2 oz (65.182 kg)] 143 lb 11.2 oz (65.182 kg) (09/13 0403)  Hemodynamic parameters for last 24 hours:    Intake/Output from previous day: 09/12 0701 - 09/13 0700 In: 480 [P.O.:480] Out: -  Intake/Output this shift:    General appearance: alert, cooperative and no distress Heart: slightly irregular, has frequent pvc's Lungs: mildly dim in bases Abdomen: benign Extremities: no edema Wound: incis healig well  Lab Results:  Recent Labs  04/16/14 0400 04/17/14 0720  WBC 4.7 4.1  HGB 9.1* 9.6*  HCT 27.0* 28.1*  PLT 68* 89*   BMET:  Recent Labs  04/17/14 0720 04/17/14 1856  NA 138 138  K 4.0 4.0  CL 101 100  CO2 27 28  GLUCOSE 93 123*  BUN 22 18  CREATININE 0.75 0.71  CALCIUM 8.6 8.4    PT/INR: No results found for this basename: LABPROT, INR,  in the last 72 hours ABG    Component Value Date/Time   PHART 7.363 04/14/2014 0145   HCO3 21.9 04/14/2014 0145   TCO2 22 04/14/2014 1737   ACIDBASEDEF 3.0* 04/14/2014 0145   O2SAT 98.0 04/14/2014 0145   CBG (last 3)   Recent Labs  04/16/14 0410 04/16/14 0752 04/18/14 0625  GLUCAP 88 75 77   . Meds Scheduled Meds: . acetaminophen  1,000 mg Oral 4 times per day   Or  . acetaminophen (TYLENOL) oral liquid 160 mg/5 mL  1,000 mg Per Tube 4 times per day  . furosemide  40 mg Oral Daily  . lactobacillus acidophilus & bulgar  1 tablet  Oral BID  . metFORMIN  500 mg Oral Q breakfast  . metoprolol tartrate  25 mg Oral BID  . pantoprazole  40 mg Oral Daily  . potassium chloride  20 mEq Oral Daily  . pravastatin  40 mg Oral Daily  . sodium chloride  3 mL Intravenous Q12H  . sodium chloride  3 mL Intravenous Q12H   Continuous Infusions:  PRN Meds:.sodium chloride, diphenoxylate-atropine, docusate sodium, Influenza vac split quadrivalent PF, metoprolol, ondansetron (ZOFRAN) IV, oxyCODONE, sodium chloride, sodium chloride, traMADol  Xrays Dg Chest 2 View  04/17/2014   CLINICAL DATA:  Chest tube removal, right pleural effusion  EXAM: CHEST  2 VIEW  COMPARISON:  04/16/2014  FINDINGS: For chest tube is been removed. No pneumothorax. Stable mild cardiac enlargement. Stable mild medial left lung base opacity likely atelectasis.  IMPRESSION: No pneumothorax status post chest tube removal.   Electronically Signed   By: Skipper Cliche M.D.   On: 04/17/2014 08:40    Assessment/Plan: S/P Procedure(s) (LRB): AORTIC VALVE REPLACEMENT (AVR) (N/A)  1 doing well overall 2 conts to have fair amt of ectopy, including 18 beat run of SVT. Will give some magnesium to get level>2.0 and increase beta blocket a  bit. Asked Dr Radford Pax from cardiol to see 3 sugars controlled   LOS: 10 days    [name],[name] 04/18/2014  Question of afib vs vt, will not d/c today Cardiology has seen I have seen and examined [name] and agree with the above assessment  and plan.  Grace Isaac MD Beeper [phone] Office [phone] 04/18/2014 12:17 PM

## 2014-04-18 NOTE — Progress Notes (Signed)
Ambulated in hall with front wheel walker on RA. Pt ambulated 358ft with wife at side . Tolerated well. Continue to assess.

## 2014-04-19 ENCOUNTER — Other Ambulatory Visit: Payer: Self-pay | Admitting: Physician Assistant

## 2014-04-19 DIAGNOSIS — I5022 Chronic systolic (congestive) heart failure: Secondary | ICD-10-CM

## 2014-04-19 DIAGNOSIS — Z954 Presence of other heart-valve replacement: Secondary | ICD-10-CM

## 2014-04-19 DIAGNOSIS — I447 Left bundle-branch block, unspecified: Secondary | ICD-10-CM

## 2014-04-19 LAB — GLUCOSE, CAPILLARY
GLUCOSE-CAPILLARY: 102 mg/dL — AB (ref 70–99)
Glucose-Capillary: 176 mg/dL — ABNORMAL HIGH (ref 70–99)
Glucose-Capillary: 129 mg/dL — ABNORMAL HIGH (ref 70–99)

## 2014-04-19 MED ORDER — FE FUMARATE-B12-VIT C-FA-IFC PO CAPS: 1.0000 | ORAL_CAPSULE | Freq: Every day | ORAL | Status: DC

## 2014-04-19 MED ORDER — POTASSIUM CHLORIDE CRYS ER 20 MEQ PO TBCR: 20.0000 meq | EXTENDED_RELEASE_TABLET | Freq: Every day | ORAL | Status: DC

## 2014-04-19 MED ORDER — FE FUMARATE-B12-VIT C-FA-IFC PO CAPS
1.0000 | ORAL_CAPSULE | Freq: Every day | ORAL | Status: DC
  Administered 2014-04-19 – 2014-04-20 (×2): 1 via ORAL
  Filled 2014-04-19 (×2): qty 1

## 2014-04-19 MED ORDER — METOPROLOL TARTRATE 50 MG PO TABS: 50.0000 mg | ORAL_TABLET | Freq: Two times a day (BID) | ORAL | Status: DC

## 2014-04-19 MED ORDER — TRAMADOL HCL 50 MG PO TABS: 50.0000 mg | ORAL_TABLET | Freq: Four times a day (QID) | ORAL | Status: DC | PRN

## 2014-04-19 MED ORDER — LISINOPRIL 2.5 MG PO TABS
2.5000 mg | ORAL_TABLET | Freq: Every day | ORAL | Status: DC
  Administered 2014-04-19 – 2014-04-20 (×2): 2.5 mg via ORAL
  Filled 2014-04-19 (×2): qty 1

## 2014-04-19 MED ORDER — FUROSEMIDE 40 MG PO TABS: 40.0000 mg | ORAL_TABLET | Freq: Every day | ORAL | Status: DC

## 2014-04-19 MED ORDER — ASPIRIN 81 MG PO TBEC: 81.0000 mg | DELAYED_RELEASE_TABLET | Freq: Every day | ORAL | Status: DC

## 2014-04-19 MED ORDER — METOPROLOL TARTRATE 50 MG PO TABS
50.0000 mg | ORAL_TABLET | Freq: Two times a day (BID) | ORAL | Status: DC
  Administered 2014-04-19 – 2014-04-20 (×3): 50 mg via ORAL
  Filled 2014-04-19 (×4): qty 1

## 2014-04-19 NOTE — Progress Notes (Addendum)
Pt up and ambulating with student RN. Wife at side. Denied need for discharge teaching reinforcement. Will continue to follow patient until discharge, which may be later today according to wife. No discharge orders noted at this time.

## 2014-04-19 NOTE — Progress Notes (Addendum)
      [address]       [place] [address]             [phone]        6 Days Post-Op Procedure(s) (LRB): AORTIC VALVE REPLACEMENT (AVR) (N/A)  Subjective: Patient eating breakfast. Has complaints of constipation.  Objective: Vital signs in last 24 hours: Temp:  [98.1 F (36.7 C)-98.8 F (37.1 C)] 98.8 F (37.1 C) (09/14 0400) Pulse Rate:  [56-103] 86 (09/14 0400) Cardiac Rhythm:  [-] Normal sinus rhythm;Bundle branch block (09/14 0400) Resp:  [17-20] 20 (09/14 0400) BP: (96-120)/(54-68) 119/54 mmHg (09/14 0400) SpO2:  [94 %-97 %] 94 % (09/14 0400) Weight:  [138 lb 3.2 oz (62.687 kg)] 138 lb 3.2 oz (62.687 kg) (09/14 0400)  Pre op weight 58 kg Current Weight  04/19/14 138 lb 3.2 oz (62.687 kg)      Intake/Output from previous day: 09/13 0701 - 09/14 0700 In: 600 [P.O.:600] Out: -    Physical Exam:  Cardiovascular: Slightly irregular-PVCs Pulmonary: Slightly decreased at bases; no rales, wheezes, or rhonchi. Abdomen: Soft, non tender, bowel sounds present. Extremities: Mild bilateral lower extremity edema. Wounds: Clean and dry.  No erythema or signs of infection.  Lab Results: CBC: Recent Labs  04/17/14 0720  WBC 4.1  HGB 9.6*  HCT 28.1*  PLT 89*   BMET:  Recent Labs  04/17/14 0720 04/17/14 1856  NA 138 138  K 4.0 4.0  CL 101 100  CO2 27 28  GLUCOSE 93 123*  BUN 22 18  CREATININE 0.75 0.71  CALCIUM 8.6 8.4    PT/INR:  Lab Results  Component Value Date   INR 1.60* 04/13/2014   INR 1.82* 04/13/2014   INR 1.23 04/09/2014   ABG:  INR: Will add last result for INR, ABG once components are confirmed Will add last 4 CBG results once components are confirmed  Assessment/Plan:  1. CV - Had short run of a fib yesterday.According to monitor PVCS and missed beats on occasion.SR/PVCs this am.  On Lopressor 37.5 bid. Will increase Lopressor to 50 bid and monitor. 2.  Pulmonary - Encourage incentive spirometer 3. Volume Overload  - On Lasix 40 daily 4.  Acute blood loss anemia - Last H and H 9.6 and 28.1. Start Trinsicon 5.DM-CBGs 77/146/102. On Metformin 500 daily. 6. Thrombocytopenia-last platelets 89,000.Not on lovenox or ecasa. Will start baby aspirin at discharge. 7. Prune juice for constipation 8. Will discuss discharge disposition with surgeon and cardiology  ZIMMERMAN,DONIELLE MPA-C 04/19/2014,7:46 AM  Home with HHN  no PVC's this pm patient examined and medical record reviewed,agree with above note. [name],Dainel Arcidiacono 04/19/2014

## 2014-04-19 NOTE — Progress Notes (Signed)
Patient Name: [name] Date of Encounter: 04/19/2014     Active Problems:   Aortic stenosis   Chronic systolic heart failure   Unstable angina   Chest pain   S/P AVR (aortic valve replacement)   Cardiomyopathy, dilated, nonischemic   Postoperative atrial fibrillation    SUBJECTIVE  Feeling fine. Waiting for wires to be removed and then wanting to go home.   CURRENT MEDS . ferrous [id number] C-folic acid  1 capsule Oral Daily  . furosemide  40 mg Oral Daily  . lactobacillus acidophilus & bulgar  1 tablet Oral BID  . magnesium oxide  400 mg Oral BID  . metFORMIN  500 mg Oral Q breakfast  . metoprolol tartrate  50 mg Oral BID  . pantoprazole  40 mg Oral Daily  . potassium chloride  20 mEq Oral Daily  . pravastatin  40 mg Oral Daily  . sodium chloride  3 mL Intravenous Q12H  . sodium chloride  3 mL Intravenous Q12H    OBJECTIVE  Filed Vitals:   04/18/14 2200 04/18/14 2205 04/18/14 2300 04/19/14 0400  BP: 120/60   119/54  Pulse: 100 103 80 86  Temp:  98.8 F (37.1 C)  98.8 F (37.1 C)  TempSrc:  Oral  Oral  Resp:  17  20  Height:      Weight:    138 lb 3.2 oz (62.687 kg)  SpO2:  95%  94%    Intake/Output Summary (Last 24 hours) at 04/19/14 1042 Last data filed at 04/19/14 0836  Gross per 24 hour  Intake    600 ml  Output      0 ml  Net    600 ml   Filed Weights   04/17/14 0508 04/18/14 0403 04/19/14 0400  Weight: 145 lb 8 oz (65.998 kg) 143 lb 11.2 oz (65.182 kg) 138 lb 3.2 oz (62.687 kg)    PHYSICAL EXAM  General: Well developed, well nourished, in no acute distress  Head: Eyes PERRLA, No xanthomas. Normal cephalic and atramatic  Lungs: Clear bilaterally to auscultation and percussion.  Heart: HRRR S1 S2 Pulses are 2+ & equal.  Abdomen: Bowel sounds are positive, abdomen soft and non-tender without masses  Extremities: No clubbing, cyanosis or edema. DP +1  Neuro: Alert and oriented X 3.  Psych: Good affect, responds  appropriately   Accessory Clinical Findings  CBC  Recent Labs  04/17/14 0720  WBC 4.1  HGB 9.6*  HCT 28.1*  MCV 91.8  PLT 89*   Basic Metabolic Panel  Recent Labs  04/17/14 0720 04/17/14 1856 04/18/14 0655  NA 138 138  --   K 4.0 4.0  --   CL 101 100  --   CO2 27 28  --   GLUCOSE 93 123*  --   BUN 22 18  --   CREATININE 0.75 0.71  --   CALCIUM 8.6 8.4  --   MG  --   --  1.9    TELE  NSR w/ baseline LBBB. Freq ectopy with PVCs, vent bigeminy and trigeminy.  Radiology/Studies  Dg Orthopantogram  04/10/2014   CLINICAL DATA:  Preop AVR  EXAM: ORTHOPANTOGRAM/PANORAMIC  COMPARISON:  None.  FINDINGS: Patient is edentulous.  No tooth fragment/remnant or apical lucency is visualized.  No fracture is seen.  IMPRESSION: Patient is edentulous.   Electronically Signed   By: Julian Hy M.D.   On: 04/10/2014 08:14   Dg Chest 2 View  04/17/2014  CLINICAL DATA:  Chest tube removal, right pleural effusion  EXAM: CHEST  2 VIEW  COMPARISON:  04/16/2014  FINDINGS: For chest tube is been removed. No pneumothorax. Stable mild cardiac enlargement. Stable mild medial left lung base opacity likely atelectasis.  IMPRESSION: No pneumothorax status post chest tube removal.   Electronically Signed   By: Skipper Cliche M.D.   On: 04/17/2014 08:40   Dg Chest 2 View  04/16/2014   CLINICAL DATA:  Chest pain  EXAM: CHEST  2 VIEW  COMPARISON:  04/15/2014  FINDINGS: Right chest tube is again identified and stable. No pneumothorax is seen. No recurrent effusion is noted. The left lung remains clear. The knee cardiac shadow is stable. A right jugular sheath remains in place.  IMPRESSION: No recurrent pneumothorax or pleural effusion is noted.   Electronically Signed   By: Inez Catalina M.D.   On: 04/16/2014 07:27   Dg Chest 2 View  04/08/2014   CLINICAL DATA:  Chest pain  EXAM: CHEST  2 VIEW  COMPARISON:  11/11/1998 day  FINDINGS: No cardiomegaly. Stable mediastinal contours. Posterior mediastinal  widening above and at the diaphragm level correlates with bulky degenerative endplate spurs. There is no edema, consolidation, effusion, or pneumothorax. Fusion of three lower thoracic vertebrae. No osseous findings to explain chest pain.  IMPRESSION: No active cardiopulmonary disease.   Electronically Signed   By: Jorje Guild M.D.   On: 04/08/2014 12:39   Dg Chest Port 1 View  04/15/2014   CLINICAL DATA:  73 year old male, status post aortic valve replacement.  EXAM: PORTABLE CHEST - 1 VIEW  COMPARISON:  04/15/2014 6:33 a.m., 04/14/2014, 04/13/2014, 04/08/2014, chest CT 12/09/2013  FINDINGS: Tubes/lines:  Interval placement of right-sided large bore thoracostomy tube with the tip terminating at the cardiophrenic angle. Unchanged position of right internal jugular sheath. Percutaneous cardiac pacing leads are visualized at the lower chest/upper abdomen. Overlying EKG leads.  Improved visualization at the right base status post chest tube placement. Partial obscuration left hemidiaphragm. Improved right lung aeration. Lung volumes remain low.  Cardiomediastinal silhouette unchanged. Calcifications of the aortic arch. Surgical changes of aortic valve and median sternotomy again noted.  No visualized pneumothorax.  No visualized displaced acute fracture.  Unremarkable appearance of the upper abdomen.  IMPRESSION: Interval placement of large bore right-sided thoracostomy tube with the tip terminating at the cardiophrenic angle, with decreased right pleural fluid and improved right-sided aeration.  Lung volumes remain low with persisting atelectasis and left basilar opacity, potentially representing small pleural fluid.  Redemonstration of surgical changes of median sternotomy and aortic valve replacement.  Unchanged position of right internal jugular sheath.  These results were called by telephone at the time of interpretation on 04/15/2014 at 10:15 am to Ms Richardean Sale, who verbally acknowledged these results.   Signed,  Dulcy Fanny. Earleen Newport, DO  [facility]  [facility]   Electronically Signed   By: Corrie Mckusick O.D.   On: 04/15/2014 10:18   Dg Chest Port 1 View  04/15/2014   CLINICAL DATA:  Followup atelectasis and right pleural effusion.  EXAM: PORTABLE CHEST - 1 VIEW  COMPARISON:  Portable chest x-rays yesterday and 04/13/2014. Two-view chest x-ray 04/08/2014.  FINDINGS: Sternotomy for CABG. Cardiac silhouette upper normal in size for technique. Interval increase in the moderate sized right pleural effusion. Worsening aeration at the right lung base, with dense consolidation in the right lower lobe. Worsening aeration in the left lung base. Pulmonary vascularity normal without evidence  of pulmonary edema. Interval Swan-Ganz catheter removal. Right jugular introducer sheath tip projects over the upper SVC.  IMPRESSION: Support apparatus satisfactory. Enlarging right pleural effusion and increasing atelectasis and/or pneumonia in the right lower lobe. Mild left basilar atelectasis.   Electronically Signed   By: Evangeline Dakin M.D.   On: 04/15/2014 07:57   Dg Chest Portable 1 View In Am  04/14/2014   CLINICAL DATA:  Coronary artery disease.  Aortic valve replacement.  EXAM: PORTABLE CHEST - 1 VIEW  COMPARISON:  04/13/2014  FINDINGS: Changes of CABG and valve replacement. Interval extubation and removal of NG tube. Increasing bibasilar opacities, likely atelectasis, right greater than left. Small right pleural effusion. No pneumothorax. Swan-Ganz catheter is in stable position.  IMPRESSION: Worsening bibasilar atelectasis following extubation. Small right pleural effusion.   Electronically Signed   By: Rolm Baptise M.D.   On: 04/14/2014 08:07   Dg Chest Portable 1 View  04/13/2014   CLINICAL DATA:  Postop  EXAM: PORTABLE CHEST - 1 VIEW  COMPARISON:  04/08/2014  FINDINGS: Endotracheal tube appropriately positioned, 5.1 cm above chronic. Nasogastric tube extends beyond the  inferior aspect of the film. Right IJ Swan-Ganz catheter with tip at pulmonary outflow tract. Interval median sternotomy. Aortic valve repair.  Midline trachea. Normal heart size. Atherosclerosis in the transverse aorta. No pleural effusion or pneumothorax. mild atelectasis in the left mid lung.  IMPRESSION: Appropriate position of support apparatus.  Mild volume loss and subsegmental atelectasis in the left mid lung.   Electronically Signed   By: Abigail Miyamoto M.D.   On: 04/13/2014 14:01    ASSESSMENT AND PLAN  Mr. Alcorn is a 73 y.o.male hx of non-obstructive CAD by cath 2008, aortic stenosis, HL, DM, bilateral carotid stenosis admitted on 04/08/14 with chest pain  Severe aortic valve stenosis: s/p AVR. Continues to progress well.   Nonobstructive ASCAD - Dr. Radford Pax recommended low dose ASA. Cont statin and BB.  Moderate to severe LV dysfunction EF 35% with normal LVEDP at cath.  -- Consider adding a low dose ACE (BPs have been soft- but improving. Last pressure SBP ~125) Creat normal. Would schedule BMET in 1 week. MD to see.  -- Continue Lasix/BB.   PAF short burst- CVTS called [place] for nonsustained VTAC that occurred 04/17/14 but strip reviewed by Dr. Radford Pax and is not VT. It appears to be a short burst of afib. 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 Mag and Potassium ok.  -- Tele with NSR w/ baseline LBBB. Freq ectopy with PVCs, vent bigeminy and trigeminy. 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  Mable Fill R PA-C  Pager [phone]  I have seen and examined the patient along with Angelena Form R PA-C.  I have reviewed the chart, notes and new data.  I agree with PA's note.  Lying fully supine in bed, no dyspnea, no edema. Walked halls without difficulty  PLAN: Agree with adding low dose ACei. Discussed  sodium restriction and daily weight monitoring in detail. Ready for DC from our standpoint. F/U has been arranged  Sanda Klein, MD, [facility] and [place] [phone] 04/19/2014, 12:10 PM

## 2014-04-19 NOTE — Progress Notes (Signed)
Removed epicardial wires per order. 3 intact.  Pt tolerated procedure well.  Pt instructed to remain on bedrest for one hour.  Frequent vitals will be taken and documented. Pt resting with call bell within reach. Aleda Madl McClintock, RN   

## 2014-04-20 LAB — GLUCOSE, CAPILLARY
GLUCOSE-CAPILLARY: 179 mg/dL — AB (ref 70–99)
GLUCOSE-CAPILLARY: 145 mg/dL — AB (ref 70–99)

## 2014-04-20 MED ORDER — LISINOPRIL 2.5 MG PO TABS: 2.5000 mg | ORAL_TABLET | Freq: Every day | ORAL | Status: DC

## 2014-04-20 NOTE — Progress Notes (Signed)
      [address]       [place] [address]             [phone]        7 Days Post-Op Procedure(s) (LRB): AORTIC VALVE REPLACEMENT (AVR) (N/A)  Subjective: Patient had a bowel movement yesterday.  Objective: Vital signs in last 24 hours: Temp:  [100.3 F (37.9 C)] 100.3 F (37.9 C) (09/14 1941) Pulse Rate:  [93-101] 94 (09/15 0500) Cardiac Rhythm:  [-] Normal sinus rhythm;Sinus tachycardia (09/14 2015) Resp:  [18] 18 (09/15 0500) BP: (108-136)/(50-64) 122/64 mmHg (09/15 0500) SpO2:  [95 %-96 %] 96 % (09/15 0500) Weight:  [136 lb 3.9 oz (61.8 kg)] 136 lb 3.9 oz (61.8 kg) (09/15 0500)  Pre op weight 58 kg Current Weight  04/20/14 136 lb 3.9 oz (61.8 kg)      Intake/Output from previous day: 09/14 0701 - 09/15 0700 In: 840 [P.O.:840] Out: -    Physical Exam:  Cardiovascular: Slightly irregular-PVCs Pulmonary: Slightly decreased at bases; no rales, wheezes, or rhonchi. Abdomen: Soft, non tender, bowel sounds present. Extremities: Trace lower extremity edema. Wounds: Clean and dry.  No erythema or signs of infection.  Lab Results: CBC:No results found for this basename: WBC, HGB, HCT, PLT,  in the last 72 hours BMET:   Recent Labs  04/17/14 1856  NA 138  K 4.0  CL 100  CO2 28  GLUCOSE 123*  BUN 18  CREATININE 0.71  CALCIUM 8.4    PT/INR:  Lab Results  Component Value Date   INR 1.60* 04/13/2014   INR 1.82* 04/13/2014   INR 1.23 04/09/2014   ABG:  INR: Will add last result for INR, ABG once components are confirmed Will add last 4 CBG results once components are confirmed  Assessment/Plan:  1. CV - Had short run of a fib yesterday.According to monitor PVCS and missed beats on occasion.SR/PVCs this am.  On Lopressor to 50 bid and Lisinopril 2.5 daily. 2.  Pulmonary - Encourage incentive spirometer 3. Volume Overload - On Lasix 40 daily 4.  Acute blood loss anemia - Last H and H 9.6 and 28.1. Continue Trinsicon. 5.DM-CBGs  176/129/179. On Metformin 500 daily. 6. Thrombocytopenia-last platelets 89,000.Not on lovenox or ecasa. Will start baby aspirin at discharge. 7. Fever to 100.3 last. Last WBC within normal limits.No signs of wound infection, no GU complaints, no cough. Likely related to atelectasis.  Franky Reier MPA-C 04/20/2014,7:37 AM

## 2014-04-20 NOTE — Progress Notes (Signed)
Patient Name: [name] Date of Encounter: 04/20/2014  Active Problems:   Aortic stenosis   Chronic systolic heart failure   Unstable angina   Chest pain   S/P AVR (aortic valve replacement)   Cardiomyopathy, dilated, nonischemic   Postoperative atrial fibrillation   Length of Stay: 12  SUBJECTIVE  Feels well and asks about going home. Low grade elevation in temp. Walked across entire unit without dyspnea. Mild anemia, stable, normal WBC. Occ PVCs, no sustained arrhythmia.  CURRENT MEDS . ferrous [id number] C-folic acid  1 capsule Oral Daily  . furosemide  40 mg Oral Daily  . lactobacillus acidophilus & bulgar  1 tablet Oral BID  . lisinopril  2.5 mg Oral Daily  . magnesium oxide  400 mg Oral BID  . metFORMIN  500 mg Oral Q breakfast  . metoprolol tartrate  50 mg Oral BID  . pantoprazole  40 mg Oral Daily  . potassium chloride  20 mEq Oral Daily  . pravastatin  40 mg Oral Daily  . sodium chloride  3 mL Intravenous Q12H  . sodium chloride  3 mL Intravenous Q12H    OBJECTIVE   Intake/Output Summary (Last 24 hours) at 04/20/14 0903 Last data filed at 04/20/14 0526  Gross per 24 hour  Intake    600 ml  Output      0 ml  Net    600 ml   Filed Weights   04/18/14 0403 04/19/14 0400 04/20/14 0500  Weight: 143 lb 11.2 oz (65.182 kg) 138 lb 3.2 oz (62.687 kg) 136 lb 3.9 oz (61.8 kg)    PHYSICAL EXAM Filed Vitals:   04/19/14 1200 04/19/14 1941 04/20/14 0500 04/20/14 0830  BP: 121/59 108/52 122/64 110/54  Pulse: 94 100 94 90  Temp:  100.3 F (37.9 C)  98.2 F (36.8 C)  TempSrc:  Oral  Oral  Resp: 18 18 18 20   Height:      Weight:   136 lb 3.9 oz (61.8 kg)   SpO2:  95% 96% 100%   General: Alert, oriented x3, no distress Head: no evidence of trauma, PERRL, EOMI, no exophtalmos or lid lag, no myxedema, no xanthelasma; normal ears, nose and oropharynx Neck: normal jugular venous pulsations and no hepatojugular reflux; brisk carotid pulses without  delay and no carotid bruits Chest: healthy sternotomy scar, clear to auscultation, but diminished breath sounds at bases, normal fremitus, symmetrical and full respiratory excursions Cardiovascular: normal position and quality of the apical impulse, regular rhythm, normal first and second heart sounds, no rubs or gallops, 1/6 systolic ejection murmur Abdomen: no tenderness or distention, no masses by palpation, no abnormal pulsatility or arterial bruits, normal bowel sounds, no hepatosplenomegaly Extremities: no clubbing, cyanosis or edema; 2+ radial, ulnar and brachial pulses bilaterally; 2+ right femoral, posterior tibial and dorsalis pedis pulses; 2+ left femoral, posterior tibial and dorsalis pedis pulses; no subclavian or femoral bruits Neurological: grossly nonfocal  LABS  CBC No results found for this basename: WBC, NEUTROABS, HGB, HCT, MCV, PLT,  in the last 72 hours Basic Metabolic Panel  Recent Labs  04/17/14 1856 04/18/14 0655  NA 138  --   K 4.0  --   CL 100  --   CO2 28  --   GLUCOSE 123*  --   BUN 18  --   CREATININE 0.71  --   CALCIUM 8.4  --   MG  --  1.9    Radiology Studies Imaging results have been reviewed and  No results found.  TELE SR, PVCs   ASSESSMENT AND PLAN  Tolerating ACEi - titrate up as outpatient Appt with Jory Sims NP in our [place] office next week on 9/24 Will arrange F/U with Dr. Tamala Julian after that.   Sanda Klein, MD, [facility] [phone] office [phone] pager 04/20/2014 9:03 AM

## 2014-04-20 NOTE — Progress Notes (Signed)
Pt discharging home with family.  All instructions and prescriptions given and reviewed.  Follow up appts in place, home care arranged, equipment delivered.  All questions answered.

## 2014-04-26 ENCOUNTER — Telehealth: Payer: Self-pay | Admitting: *Deleted

## 2014-04-26 ENCOUNTER — Other Ambulatory Visit: Payer: Self-pay | Admitting: *Deleted

## 2014-04-26 DIAGNOSIS — I5022 Chronic systolic (congestive) heart failure: Secondary | ICD-10-CM

## 2014-04-26 DIAGNOSIS — I10 Essential (primary) hypertension: Secondary | ICD-10-CM

## 2014-04-26 NOTE — Progress Notes (Signed)
Pt notified and will get labs. Also will call  as orders for suture removal not put in yet. Pt understood

## 2014-04-26 NOTE — Telephone Encounter (Signed)
PT went to have labs done today but they were put in under another provider. Please fix and put under [facility] call pt when done so he can go back and have them done. Thanks/tmj

## 2014-04-27 ENCOUNTER — Other Ambulatory Visit: Payer: Self-pay | Admitting: Adult Health

## 2014-04-28 LAB — BASIC METABOLIC PANEL WITH GFR
BUN: 10 mg/dL (ref 6–23)
CHLORIDE: 98 meq/L (ref 96–112)
CO2: 26 meq/L (ref 19–32)
Calcium: 9.6 mg/dL (ref 8.4–10.5)
Creat: 0.79 mg/dL (ref 0.50–1.35)
GFR, EST NON AFRICAN AMERICAN: 88 mL/min
GFR, Est African American: >89 mL/min
Glucose, Bld: 133 mg/dL — ABNORMAL HIGH (ref 70–99)
POTASSIUM: 4.4 meq/L (ref 3.5–5.3)
Sodium: 134 mEq/L — ABNORMAL LOW (ref 135–145)

## 2014-04-29 ENCOUNTER — Encounter: Payer: Medicare Other | Admitting: Adult Health

## 2014-04-29 ENCOUNTER — Encounter: Payer: Self-pay | Admitting: *Deleted

## 2014-04-29 ENCOUNTER — Telehealth: Payer: Self-pay | Admitting: Cardiovascular Disease

## 2014-04-29 NOTE — Telephone Encounter (Signed)
Will forward to provider for resulting

## 2014-04-29 NOTE — Discharge Summary (Signed)
patient examined and medical record reviewed,agree with above note. [name],PETER 04/29/2014

## 2014-04-29 NOTE — Progress Notes (Signed)
Error. No Show 

## 2014-04-29 NOTE — Telephone Encounter (Signed)
Lab results / tgs  °

## 2014-05-04 ENCOUNTER — Telehealth: Payer: Self-pay | Admitting: *Deleted

## 2014-05-07 ENCOUNTER — Telehealth: Payer: Self-pay | Admitting: Cardiovascular Disease

## 2014-05-07 NOTE — Telephone Encounter (Signed)
Please call with lab results.  States they have been waiting over a week. / tgs

## 2014-05-07 NOTE — Telephone Encounter (Signed)
Called [phone] per wife,spoke with pt,told him his labs are ok,copy to pcp

## 2014-05-07 NOTE — Telephone Encounter (Signed)
I tried calling the number that is listed but no answer, no voice mail. Labs are fine. Will have one of the nurses try calling later.

## 2014-05-07 NOTE — Telephone Encounter (Signed)
Will forward to [employer]

## 2014-05-14 ENCOUNTER — Other Ambulatory Visit: Payer: Self-pay | Admitting: Cardiothoracic Surgery

## 2014-05-14 DIAGNOSIS — I350 Nonrheumatic aortic (valve) stenosis: Secondary | ICD-10-CM

## 2014-05-19 ENCOUNTER — Ambulatory Visit
Admission: RE | Admit: 2014-05-19 | Discharge: 2014-05-19 | Disposition: A | Discharge status: Home / Self Care | Payer: Medicare Other | Source: Ambulatory Visit | Attending: Cardiothoracic Surgery | Admitting: Cardiothoracic Surgery

## 2014-05-19 ENCOUNTER — Ambulatory Visit (INDEPENDENT_AMBULATORY_CARE_PROVIDER_SITE_OTHER): Payer: Self-pay | Admitting: Cardiothoracic Surgery

## 2014-05-19 ENCOUNTER — Encounter: Payer: Self-pay | Admitting: Cardiothoracic Surgery

## 2014-05-19 VITALS — BP 119/72 | HR 84 | Resp 20 | Ht 68.0 in | Wt 135.0 lb

## 2014-05-19 DIAGNOSIS — I350 Nonrheumatic aortic (valve) stenosis: Secondary | ICD-10-CM

## 2014-05-19 DIAGNOSIS — J939 Pneumothorax, unspecified: Secondary | ICD-10-CM

## 2014-05-19 DIAGNOSIS — Z952 Presence of prosthetic heart valve: Secondary | ICD-10-CM

## 2014-05-19 DIAGNOSIS — Z954 Presence of other heart-valve replacement: Secondary | ICD-10-CM
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
## 2014-05-24 NOTE — Telephone Encounter (Signed)
Message copied by Desma Mcgregor on Mon May 24, 2014  7:57 AM ------      Message from: Lestine Mount      Created: Fri May 21, 2014 10:51 AM      Regarding: phone note encounter still open on pt from 04/29/14 about labs       Do you know if we ever called patient with results? If so please note and close encounter. Thanks ------

## 2014-05-24 NOTE — Telephone Encounter (Signed)
Called [phone] per wife,spoke with pt,told him his labs are ok,copy to pcp done on 10/2

## 2014-05-25 ENCOUNTER — Ambulatory Visit (HOSPITAL_COMMUNITY)
Admission: RE | Admit: 2014-05-25 | Discharge: 2014-05-25 | Disposition: A | Discharge status: Home / Self Care | Payer: Medicare Other | Source: Ambulatory Visit | Attending: Adult Health | Admitting: Adult Health

## 2014-05-25 ENCOUNTER — Encounter: Payer: Self-pay | Admitting: Adult Health

## 2014-05-25 ENCOUNTER — Ambulatory Visit (INDEPENDENT_AMBULATORY_CARE_PROVIDER_SITE_OTHER): Payer: Medicare Other | Admitting: Adult Health

## 2014-05-25 VITALS — BP 102/60 | HR 85 | Ht 68.0 in | Wt 136.0 lb

## 2014-05-25 DIAGNOSIS — R059 Cough, unspecified: Secondary | ICD-10-CM

## 2014-05-25 DIAGNOSIS — R05 Cough: Secondary | ICD-10-CM | POA: Diagnosis present

## 2014-05-25 DIAGNOSIS — J90 Pleural effusion, not elsewhere classified: Secondary | ICD-10-CM | POA: Diagnosis not present

## 2014-05-25 DIAGNOSIS — I359 Nonrheumatic aortic valve disorder, unspecified: Secondary | ICD-10-CM

## 2014-05-25 DIAGNOSIS — I5022 Chronic systolic (congestive) heart failure: Secondary | ICD-10-CM

## 2014-05-25 NOTE — Assessment & Plan Note (Signed)
Blood pressure is low normal. Currently. I will not make any changes in his medication regimen at this time.

## 2014-05-25 NOTE — Assessment & Plan Note (Signed)
No overt evidence of fluid retention. I am hearing some mild crackles in the lungs, which may be related to bronchitis versus mild CHF. I do not see any lower extremity edema, his O2 sat is 99%. He was taken off of benazepril HCTZ, and metoprolol 25 mg prior to discharge. He remains on low-dose lisinopril, Lasix, 40 mg. We will need to repeat his echocardiogram in approximately 3-6 months to evaluate for changes in LV function.

## 2014-05-25 NOTE — Patient Instructions (Signed)
Your physician wants you to follow-up in: 6 months to be established Cardiologist. You will receive a reminder letter in the mail two months in advance. If you don't receive a letter, please call our office to schedule the follow-up appointment.  Your physician recommends that you continue on your current medications as directed. Please refer to the Current Medication list given to you today.  A chest x-ray takes a picture of the organs and structures inside the chest, including the heart, lungs, and blood vessels. This test can show several things, including, whether the heart is enlarges; whether fluid is building up in the lungs; and whether pacemaker / defibrillator leads are still in place.  Thank you for choosing [facility]!!

## 2014-05-25 NOTE — Assessment & Plan Note (Signed)
Is doing well post aortic valve replacement. Sternotomy scar is well-healed without evidence of infection, bleeding, or erythema. The patient denies any severe pain. Although he is having still some soreness. He is not yet regained any energy, and he is having trouble keeping his appetite up. He states his breathing status is better, but he is having a congested cough. I will do a PA and lateral chest x-ray to evaluate for abnormalities. We will see the patient back in the office in 3 months unless he is symptomatic.

## 2014-05-25 NOTE — Progress Notes (Deleted)
Name: [name]    DOB: Jun 18, 1941  Age: 73 y.o.  MR#: [id number]       PCP:  Karis Juba, PA-C      Insurance: Payor: [employer] MEDICARE / Plan: [employer] / Product Type: *No Product type* /   CC:    Chief Complaint  Patient presents with  . Coronary Artery Disease  . Aortic Stenosis    VS Filed Vitals:   05/25/14 1517  BP: 102/60  Pulse: 85  Height: 5\' 8"  (1.727 m)  Weight: 136 lb (61.689 kg)  SpO2: 99%    Weights Current Weight  05/25/14 136 lb (61.689 kg)  05/19/14 135 lb (61.236 kg)  04/20/14 136 lb 3.9 oz (61.8 kg)    Blood Pressure  BP Readings from Last 3 Encounters:  05/25/14 102/60  05/19/14 119/72  04/20/14 110/54     Admit date:  (Not on file) Last encounter with RMR:  04/08/2014   Allergy Actos; Morphine; and Statins  Current Outpatient Prescriptions  Medication Sig Dispense Refill  . aspirin 81 MG EC tablet Take 1 tablet (81 mg total) by mouth daily. Swallow whole.  30 tablet  12  . feeding supplement, ENSURE COMPLETE, (ENSURE COMPLETE) LIQD Take 237 mLs by mouth 2 (two) times daily between meals.      . ferrous [id number] C-folic acid (TRINSICON / FOLTRIN) capsule Take 1 capsule by mouth daily. For one month then stop.  30 capsule  0  . fish oil-omega-3 fatty acids 1000 MG capsule Take 1 g by mouth daily.       . metFORMIN (GLUCOPHAGE) 500 MG tablet TAKE 1 TABLET BY MOUTH EVERY MORNING  30 tablet  5  . metoprolol (LOPRESSOR) 50 MG tablet Take 50 mg by mouth 2 (two) times daily.      . pravastatin (PRAVACHOL) 40 MG tablet Take 40 mg by mouth daily.       . traMADol (ULTRAM) 50 MG tablet Take 1 tablet (50 mg total) by mouth every 6 (six) hours as needed for severe pain.  30 tablet  0   No current facility-administered medications for this visit.    Discontinued Meds:    Medications Discontinued During This Encounter  Medication Reason  . KLOR-CON M20 20 MEQ tablet Error  . lisinopril (PRINIVIL,ZESTRIL) 2.5 MG  tablet Error  . metoprolol (LOPRESSOR) 50 MG tablet Error  . furosemide (LASIX) 40 MG tablet Error    Patient Active Problem List   Diagnosis Date Noted  . Cardiomyopathy, dilated, nonischemic 04/18/2014  . Postoperative atrial fibrillation 04/18/2014  . 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  . Aortic stenosis 04/08/2014  . Chronic systolic heart failure 83/15/1761  . Unstable angina 04/08/2014  . Chest pain 04/08/2014  . Personal history of colonic polyps 11/26/2013  . Loss of weight 11/02/2013  . AORTIC STENOSIS/ INSUFFICIENCY, NON-RHEUMATIC 07/11/2010  . LBBB 07/11/2010  . CAROTID BRUIT 07/11/2010  . NEOPLASM OF UNCERTAIN BEHAVIOR OF SKIN 09/27/2008  . BENIGN PROSTATIC HYPERTROPHY, WITH OBSTRUCTION 09/27/2008  . KNEE PAIN 09/27/2008  . FLATULENCE ERUCTATION AND GAS PAIN 04/06/2008  . ALLERGIC RHINITIS 01/09/2008  . CONSTIPATION 10/17/2007  . HEADACHE 07/25/2007  . LEG CRAMPS 02/13/2007  . DYSPNEA ON EXERTION 01/10/2007  . DIABETES MELLITUS, TYPE II 10/10/2006  . HYPERLIPIDEMIA 10/10/2006  . HYPERTENSION 10/10/2006  . GERD 10/10/2006  . CARDIAC MURMUR 10/10/2006  . CHEST PAIN, EXERTIONAL 10/10/2006    LABS    Component Value Date/Time   NA 134* 04/27/2014  1133   NA 138 04/17/2014 1856   NA 138 04/17/2014 0720   K 4.4 04/27/2014 1133   K 4.0 04/17/2014 1856   K 4.0 04/17/2014 0720   CL 98 04/27/2014 1133   CL 100 04/17/2014 1856   CL 101 04/17/2014 0720   CO2 26 04/27/2014 1133   CO2 28 04/17/2014 1856   CO2 27 04/17/2014 0720   GLUCOSE 133* 04/27/2014 1133   GLUCOSE 123* 04/17/2014 1856   GLUCOSE 93 04/17/2014 0720   BUN 10 04/27/2014 1133   BUN 18 04/17/2014 1856   BUN 22 04/17/2014 0720   CREATININE 0.79 04/27/2014 1133   CREATININE 0.71 04/17/2014 1856   CREATININE 0.75 04/17/2014 0720   CREATININE 0.61 04/16/2014 0400   CREATININE 0.82 02/22/2014 0915   CREATININE 0.89 09/28/2013 0951   CALCIUM 9.6 04/27/2014 1133   CALCIUM 8.4 04/17/2014 1856   CALCIUM 8.6  04/17/2014 0720   GFRNONAA 88 04/27/2014 1133   GFRNONAA 90* 04/17/2014 1856   GFRNONAA 88* 04/17/2014 0720   GFRNONAA >90 04/16/2014 0400   GFRNONAA 87 02/22/2014 0915   GFRNONAA 84 09/28/2013 0951   GFRAA >89 04/27/2014 1133   GFRAA >90 04/17/2014 1856   GFRAA >90 04/17/2014 0720   GFRAA >90 04/16/2014 0400   GFRAA >89 02/22/2014 0915   GFRAA >89 09/28/2013 0951   CMP     Component Value Date/Time   NA 134* 04/27/2014 1133   K 4.4 04/27/2014 1133   CL 98 04/27/2014 1133   CO2 26 04/27/2014 1133   GLUCOSE 133* 04/27/2014 1133   BUN 10 04/27/2014 1133   CREATININE 0.79 04/27/2014 1133   CREATININE 0.71 04/17/2014 1856   CALCIUM 9.6 04/27/2014 1133   PROT 6.5 02/22/2014 0915   ALBUMIN 4.1 02/22/2014 0915   AST 22 02/22/2014 0915   ALT 22 02/22/2014 0915   ALKPHOS 62 02/22/2014 0915   BILITOT 0.9 02/22/2014 0915   GFRNONAA 88 04/27/2014 1133   GFRNONAA 90* 04/17/2014 1856   GFRAA >89 04/27/2014 1133   GFRAA >90 04/17/2014 1856       Component Value Date/Time   WBC 4.1 04/17/2014 0720   WBC 4.7 04/16/2014 0400   WBC 7.7 04/15/2014 1554   HGB 9.6* 04/17/2014 0720   HGB 9.1* 04/16/2014 0400   HGB 10.1* 04/15/2014 1554   HCT 28.1* 04/17/2014 0720   HCT 27.0* 04/16/2014 0400   HCT 29.6* 04/15/2014 1554   MCV 91.8 04/17/2014 0720   MCV 90.0 04/16/2014 0400   MCV 91.1 04/15/2014 1554    Lipid Panel     Component Value Date/Time   CHOL 151 02/22/2014 0915   TRIG 176* 02/22/2014 0915   HDL 38* 02/22/2014 0915   CHOLHDL 4.0 02/22/2014 0915   VLDL 35 02/22/2014 0915   LDLCALC 78 02/22/2014 0915    ABG    Component Value Date/Time   PHART 7.363 04/14/2014 0145   PCO2ART 38.7 04/14/2014 0145   PO2ART 121.0* 04/14/2014 0145   HCO3 21.9 04/14/2014 0145   TCO2 22 04/14/2014 1737   ACIDBASEDEF 3.0* 04/14/2014 0145   O2SAT 98.0 04/14/2014 0145     Lab Results  Component Value Date   TSH 3.998 02/22/2014   BNP (last 3 results) No results found for this basename: PROBNP,  in the last 8760 hours Cardiac Panel (last 3  results) No results found for this basename: CKTOTAL, CKMB, TROPONINI, RELINDX,  in the last 72 hours  Iron/TIBC/Ferritin/ %Sat No results found for this basename:  iron, tibc, ferritin, ironpctsat     EKG Orders placed during the hospital encounter of 04/08/14  . EKG 12-LEAD  . EKG 12-LEAD  . ED EKG  . EKG  . EKG 12-LEAD  . EKG 12-LEAD  . EKG 12-LEAD  . EKG 12-LEAD     Prior Assessment and Plan Problem List as of 05/25/2014     Cardiovascular and Mediastinum   HYPERTENSION   Last Assessment & Plan   10/22/2012 Office Visit Written 10/22/2012 11:38 AM by Lendon Colonel, NP     Excellent control of blood pressure. He will continue on benazepril HCTZ, metoprolol 25 mg daily. He continues to have labs through his primary care physician [facility] No changes in medication regimen at this time. We will follow him in one year unless he is symptomatic.    AORTIC STENOSIS/ INSUFFICIENCY, NON-RHEUMATIC   Last Assessment & Plan   10/22/2012 Office Visit Written 10/22/2012 11:41 AM by Lendon Colonel, NP     Continue to hear significant 2/6 harsh systolic murmur. Echocardiogram completed in January 2014 demonstrated moderately calcified annulus with mild to moderate stenosis. EF 50-55%, with mild to moderate hypokinesis of the distal anterior septal myocardium. He is currently asymptomatic for chest pain, dizziness, or dyspnea on exertion. We will repeat echo in 2 years unless he becomes symptomatic.    LBBB   Aortic stenosis   Chronic systolic heart failure   Unstable angina   Cardiomyopathy, dilated, nonischemic   Postoperative atrial fibrillation     Respiratory   ALLERGIC RHINITIS     Digestive   GERD   CONSTIPATION     Endocrine   DIABETES MELLITUS, TYPE II     Musculoskeletal and Integument   NEOPLASM OF UNCERTAIN BEHAVIOR OF SKIN     Genitourinary   BENIGN PROSTATIC HYPERTROPHY, WITH OBSTRUCTION     Other   HYPERLIPIDEMIA   Last Assessment & Plan    10/22/2012 Office Visit Written 10/22/2012 11:40 AM by Lendon Colonel, NP     He has had recent lipid studies completed on 10/07/2012. Total cholesterol 149 HDL 32 triglycerides 204 LDL 76. I provided him with a copy of this for his own records. LFTs were found to be within normal limits. He offers no myalgia type symptoms. Continue him on current medication regimen without changes.    KNEE PAIN   LEG CRAMPS   HEADACHE   CARDIAC MURMUR   CAROTID BRUIT   DYSPNEA ON EXERTION   CHEST PAIN, EXERTIONAL   Last Assessment & Plan   09/25/2012 Office Visit Written 09/25/2012  1:24 PM by Lendon Colonel, NP     He had cardiac cath in 2008 demonstrating non-obstructive CAD at that time.With continued CVRF to include diabetes, insulin dependent, and recurrent symptoms, will have stress myoview to evaluate for progression of CAD. He is willing to walk on treadmill. Will follow up with him in 2-3 weeks to discuss test results. With LBBB, EKG changes will be difficult to evaluate.     FLATULENCE ERUCTATION AND GAS PAIN   Loss of weight   Personal history of colonic polyps   Chest pain   S/P AVR (aortic valve replacement)       Imaging: Dg Chest 2 View  05/19/2014   CLINICAL DATA:  Aortic valve replacement for aortic stenosis. Cough. Shortness of breath.  EXAM: CHEST  2 VIEW  COMPARISON:  04/17/2014  FINDINGS: Median sternotomy. Aortic valve prosthesis. Small bilateral pleural effusions with passive  atelectasis. Heart size within normal limits. No edema. Several thoracic vertebra in the lower and upper thoracic spine appear fused.  IMPRESSION: 1. Small bilateral pleural effusions with passive atelectasis. Normal heart size and no pulmonary edema. Aortic valve prosthesis in place.   Electronically Signed   By: Sherryl Barters M.D.   On: 05/19/2014 13:03
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
## 2014-05-25 NOTE — Assessment & Plan Note (Signed)
This symptom has improved. He is now at [place] class I -ll. He is not extremely active. He is trying to get his energy back.

## 2014-05-26 ENCOUNTER — Encounter: Payer: Self-pay | Admitting: Physician Assistant

## 2014-05-26 ENCOUNTER — Ambulatory Visit (INDEPENDENT_AMBULATORY_CARE_PROVIDER_SITE_OTHER): Payer: Medicare Other | Admitting: Physician Assistant

## 2014-05-26 VITALS — BP 116/60 | HR 60 | Temp 97.5°F | Resp 19 | Wt 134.0 lb

## 2014-05-26 DIAGNOSIS — I420 Dilated cardiomyopathy: Secondary | ICD-10-CM

## 2014-05-26 DIAGNOSIS — I429 Cardiomyopathy, unspecified: Secondary | ICD-10-CM

## 2014-05-26 DIAGNOSIS — I10 Essential (primary) hypertension: Secondary | ICD-10-CM

## 2014-05-26 DIAGNOSIS — Z952 Presence of prosthetic heart valve: Secondary | ICD-10-CM

## 2014-05-26 DIAGNOSIS — Z954 Presence of other heart-valve replacement: Secondary | ICD-10-CM

## 2014-05-26 DIAGNOSIS — E1165 Type 2 diabetes mellitus with hyperglycemia: Secondary | ICD-10-CM

## 2014-05-26 DIAGNOSIS — E785 Hyperlipidemia, unspecified: Secondary | ICD-10-CM

## 2014-05-26 NOTE — Progress Notes (Signed)
Patient ID: [name], DOB: [birth year], 73 y.o. Date of Encounter: @DATE @  Chief Complaint:  Chief Complaint  Patient presents with  . 3 month check    is fasting, very unsure of meds he is to take    HPI: 73 y.o. year old white male  presents today for followup office visit and also to followup after his recent hospitalization which included aortic valve replacement.  He had aortic valve replacement secondary to severe aortic stenosis. Normal coronaries by cardiac catheterization. Preoperatively he had low EF.  He was discharged from the hospital on 04/20/14. He and his wife state that he was discharged directly home-- did not have to go to any type of rehabilitation facility between. He says that he had followup appointment with Dr. Darcey Nora last Wednesday. His note is in Epic and I have reviewed that today. I saw that he did mention about patient doing cardiac rehabilitation. I followed this up with patient today.  Patient states that he would have to pay a $50 copay each time he goes to Cardiac Rehab--cannot afford this.  He says that he is able to walk up and down the street at his house and even walks around the block sometimes.  He says that he went to cardiology office and saw the nurse practitioner there for f/u office visit yesterday.  I reviewed both of those office visit notes in Epic and no medications were changed either office visit.  Patient states that he is confused about which medications he is supposed to be taking. He brought in all of his medication bottles including the ones that he was taking prior to the hospitalization as well as the ones that were prescribed at time of discharge from the hospitalization. He also states that they did give him metformin in the hospital but says that he has not been taking it since he got home because his blood sugars were low. He states that even off of Metformin his fasting morning blood sugars have been  around 138 and 99.  No other complaints or concerns today.    His last office visit with me was 02/22/2014. The following is copied from that office visit note.:  At his OV 09/28/2013  that time he presented for his routine 3 month checkup for diabetes hypertension hyperlipidemia et Ronney Asters.  However, at that visit I noticed that he looked even thinner that day than usual. I then noticed that I had noted at his prior visit with me 06/29/13 that he had also lost weight at the time of that visit. At that visit , I initially assumed that his weight loss was intentional  secondary to dietary changes to help control his diabetes.  However, at that visit 06/29/13 he reported that he decrease his dietary intake secondary to finances. At that visit he stated   "I just have to eat what I have.   I can't afford anything more."  Weight February 2014   was 167. Weight 06/29/13             was 155. Weight 09/28/13               was 147. Today's weight is again 147.  At his visit with me 09/28/13 he said that finances were not quite such a problem at that time. He stated that at the time of his prior visit in November he was in the Donut hole regarding paying for medications.  Also at his visit  09/28/13 he reported that he was having some problems with constipation over the prior 2 months. At that visit he reported that he " always had some problems with his bowels."   He stated that in the past he would go back and forth between feeling constipated having problems with diarrhea/loose stools. I do know that in the past we had to decrease his metformin down to his current dose of 500 mg daily because any higher dose caused loose stools/diarrhea.  At the visit 09/28/13 I did labs to evaluate underlying medical causes of weight loss including CBC TSH and CME T. These were all normal.  We also discussed the fact he had a screening colonoscopy 06/03/2009 and he was told to repeat 10-15 years.  At that visit I  gave him prescription and sample for Linzess.  Told him that if that was too expensive then he can get over-the-counter MiraLax and to start with 2 cap fulls daily . If He had no bowel movement with this after 3 days, then he was to call me for further dosing directions. I told him to increase his food intake and not worry about carbohydrates etc. regarding his sugar but simply just try to eat more food in general.  Was to schedule followup office visit with me in the next 1 to 2 weeks to reassess his weight and constipation. However, when we talked to him on the phone he stated he could not afford to pay for an office visit. We then recommended that he come in for a weight check free of charge.  Those encounters and recommendations are documented in the computer. See those for details.  He did come in for weight check and his weight was stable at that time. Say when he came in for that we checked our staff gave him some contact information --he could call for some assistance.  At [place] 09/28/13 he says that he is using [employer] Says that they get him through to every other Thursday. He gives very pick this up. Says that he has other phone numbers that he could call for food if he needs to but hasn't needed to so far.  He had colonoscopy by Dr. Jearld Adjutant on 12/12/13. There were polyps which were biopsied. External and internal hemorrhoids. At that time Dr. Jearld Adjutant planned to order CT of abdomen and pelvis as well as chest.  He had a CT scan on 12/09/13. No malignancy was seen on either CT scan. No significant finding seen on the CT scans.  Today he says that he gets food once a month from 4 or 5 different places--- [employer] out reach, [employer], [employer], their church.   Past Medical History  Diagnosis Date  . Hyperlipidemia   . Hypertension   . Vitamin D deficiency   . Diabetes mellitus   . GERD (gastroesophageal reflux disease)   . BPH (benign prostatic hypertrophy)   .  History of cataract   . Constipation   . CAD (coronary artery disease)   . Aortic stenosis, mild   . Cancer     spindle cell cancer of left ear  . Nephrolithiasis      Home Meds: Outpatient Prescriptions Prior to Visit  Medication Sig Dispense Refill  . aspirin 81 MG EC tablet Take 1 tablet (81 mg total) by mouth daily. Swallow whole.  30 tablet  12  . feeding supplement, ENSURE COMPLETE, (ENSURE COMPLETE) LIQD Take 237 mLs by mouth 2 (two) times daily between  meals.      . metoprolol (LOPRESSOR) 50 MG tablet Take 50 mg by mouth 2 (two) times daily.      . traMADol (ULTRAM) 50 MG tablet Take 1 tablet (50 mg total) by mouth every 6 (six) hours as needed for severe pain.  30 tablet  0  . metFORMIN (GLUCOPHAGE) 500 MG tablet TAKE 1 TABLET BY MOUTH EVERY MORNING  30 tablet  5  . fish oil-omega-3 fatty acids 1000 MG capsule Take 1 g by mouth daily.       . pravastatin (PRAVACHOL) 40 MG tablet Take 40 mg by mouth daily.       . ferrous [id number] C-folic acid (TRINSICON / FOLTRIN) capsule Take 1 capsule by mouth daily. For one month then stop.  30 capsule  0   No facility-administered medications prior to visit.     Allergies:  Allergies  Allergen Reactions  . Actos [Pioglitazone] Other (See Comments)    Cramps in legs   . Morphine     REACTION: hives  . Statins     REACTION: leg cramps but tolerates pravastatin    History   Social History  . Marital Status: Married    Spouse Name: N/A    Number of Children: N/A  . Years of Education: N/A   Occupational History  . Not on file.   Social History Main Topics  . Smoking status: Former Smoker -- 1.00 packs/day for 30 years  . Smokeless tobacco: Current User    Types: Chew     Comment: Quit >15 years ago  . Alcohol Use: No  . Drug Use: No  . Sexual Activity: Not Currently   Other Topics Concern  . Not on file   Social History Narrative   Married for >44 years    Family History  Problem Relation Age of  Onset  . Heart disease Mother   . Congestive Heart Failure Father      Review of Systems:  See HPI for pertinent ROS. All other ROS negative.    Physical Exam: Blood pressure 116/60, pulse 60, temperature 97.5 F (36.4 C), temperature source Oral, resp. rate 19, weight 134 lb (60.782 kg)., Body mass index is 20.38 kg/(m^2). General: Thin WM. Appears in no acute distress. Lungs: Crackles at bases bilaterally. O/w clear.  Heart: RRR with S1 S2. No murmurs, rubs, or gallops. Abdomen: Soft, non-tender, non-distended with normoactive bowel sounds. No hepatomegaly. No rebound/guarding. No obvious abdominal masses. Firmly palpated abdomen multiple times and he reports no areas of tenderness. Musculoskeletal:  Strength and tone normal for age. Extremities/Skin: Warm and dry.  No edema.  Neuro: Alert and oriented X 3. Moves all extremities spontaneously. Gait is normal. CNII-XII grossly in tact. Psych:  Responds to questions appropriately with a normal affect.     ASSESSMENT AND PLAN:  73 y.o. year old male with   Today I reviewed the medications that were listed on his discharge summary as far as exactly which medications he is supposed to be taking and exactly which medications that he was taking prior to hospitalization which need to be stopped. I discussed this with patient and wife thoroughly today and they voiced understanding about correct medications.  I told him to stop Metformin and I removed this from his medication list today.  I made no other new medication changes today.   Also discussed with him the fact that he cannot afford to pay the co-pay for cardiac rehabilitation he must walk 20 minutes every  day. Also discussed that he must continue drinking the Ensure at least 2 per day.   1. S/P AVR (aortic valve replacement)  2. Cardiomyopathy, dilated, nonischemic  3. DIABETES MELLITUS, TYPE II  - Hemoglobin A1c --last A1C here was 02/2014 Last microalbumin: 02/2014   He  provides excellent foot care. Reminded him of proper foot care and wearing shoes at all times.  He is overdue for ophthalmology exam. He states that he cannot afford this. He has a high co-pay.  He is on ASA 81mg , on ACE inhibitor, and on statin therapy.  Today I told him to stay off of the metformin. We'll wait to recheck an A1c at his followup office visit with me in 3 months.  I see that his weight is down even further today, so I think he can definitely stay off of metformin for now.   4. HYPERLIPIDEMIA  09/28/13 FLP was at goal LFTs were nml. Recent LFTs normal.    5. HYPERTENSION   On ACE inhibitor and Beta Blocker     6. CAROTID BRUIT  Last carotid Doppler was 12/30/12. Right ICA stenosis had progressed to 60-79%. Left ICA stenosis was stable at 40-59%. Definitely need to get followup on this.  7. Weight Loss Has had evaluation by myself as well as Dr. Wonda Amis. I did verify with patient today and he says that he is drinking Ensure  2 per day.  8. CONSTIPATION  In past was treating with Linzess, Mirilax Not a current problem.   9. BENIGN PROSTATIC HYPERTROPHY, WITH OBSTRUCTION  A screening PSA was done 09/25/12 and was normal. He is now 73 years old so can stop doing screening PSA.   10. Screening colonoscopy: 06/03/2009. Repeat 10-15 years. Had Colonoscopy --Dr. Rehman--11/26/13--Polyps. Internal and External Hemorrhoids.  11. Immunizations:  Pneumonia vaccine: Patient reports that he had this at age 80 his prior PCP  .--He would have received in Pneumovax 23.  Given The new guidelines we will need to give him the Prevnar 13. --WILL DISCUSS THIS AT NEXT REGULAR OV  Tetanus vaccine: Patient recently checked with his insurance and is going to cost him $40 and he deferred.  Zostavax: Patient check with his insurance. Too expensive. The patient defers.   F/U 3 months, sooner if needed.   Signed,     Signed, [address] [place], [place], BSFM 05/26/2014 2:26 PM

## 2014-05-31 ENCOUNTER — Encounter: Payer: Self-pay | Admitting: *Deleted

## 2014-05-31 NOTE — Progress Notes (Signed)
Patient ID: [name], male   DOB: June 02, 1941, 73 y.o.   MRN: [id number] Referral faxed to [facility] to contact [name] to begin Phase II per Dr [name] Trigt's request.

## 2014-06-17 ENCOUNTER — Other Ambulatory Visit: Payer: Self-pay | Admitting: Physician Assistant

## 2014-06-22 ENCOUNTER — Other Ambulatory Visit: Payer: Self-pay | Admitting: Physician Assistant

## 2014-06-25 ENCOUNTER — Other Ambulatory Visit: Payer: Self-pay | Admitting: Physician Assistant

## 2014-07-05 ENCOUNTER — Telehealth: Payer: Self-pay | Admitting: Physician Assistant

## 2014-07-05 NOTE — Telephone Encounter (Signed)
PT is wanting to talk to you about a prescription I couldn't really understand him.  [phone]

## 2014-07-07 MED ORDER — LISINOPRIL 2.5 MG PO TABS: 2.5000 mg | ORAL_TABLET | Freq: Every day | ORAL | Status: DC

## 2014-07-07 MED ORDER — METOPROLOL TARTRATE 50 MG PO TABS: 50.0000 mg | ORAL_TABLET | Freq: Two times a day (BID) | ORAL | Status: DC

## 2014-07-07 NOTE — Telephone Encounter (Signed)
Pt has run out of his BP meds and wanted to know if he was to still be taking.  YES.  Refills to pharmacy

## 2014-07-15 ENCOUNTER — Encounter (HOSPITAL_COMMUNITY): Payer: Self-pay | Admitting: Interventional Cardiology

## 2014-08-17 ENCOUNTER — Telehealth: Payer: Self-pay | Admitting: Family Medicine

## 2014-08-17 MED ORDER — PRAVASTATIN SODIUM 40 MG PO TABS: 40.0000 mg | ORAL_TABLET | Freq: Every day | ORAL | Status: DC

## 2014-08-17 MED ORDER — LISINOPRIL 2.5 MG PO TABS: 2.5000 mg | ORAL_TABLET | Freq: Every day | ORAL | Status: DC

## 2014-08-17 MED ORDER — METOPROLOL TARTRATE 50 MG PO TABS: 50.0000 mg | ORAL_TABLET | Freq: Two times a day (BID) | ORAL | Status: DC

## 2014-08-17 NOTE — Telephone Encounter (Signed)
Medication refilled per protocol. 

## 2014-09-02 ENCOUNTER — Encounter: Payer: Self-pay | Admitting: Physician Assistant

## 2014-09-02 ENCOUNTER — Encounter (HOSPITAL_COMMUNITY): Payer: Self-pay

## 2014-09-02 ENCOUNTER — Ambulatory Visit (INDEPENDENT_AMBULATORY_CARE_PROVIDER_SITE_OTHER): Payer: Medicare Other | Admitting: Physician Assistant

## 2014-09-02 ENCOUNTER — Other Ambulatory Visit: Payer: Self-pay | Admitting: Physician Assistant

## 2014-09-02 VITALS — BP 120/72 | HR 64 | Temp 97.6°F | Resp 18 | Wt 140.0 lb

## 2014-09-02 DIAGNOSIS — R0989 Other specified symptoms and signs involving the circulatory and respiratory systems: Secondary | ICD-10-CM

## 2014-09-02 DIAGNOSIS — I10 Essential (primary) hypertension: Secondary | ICD-10-CM | POA: Diagnosis not present

## 2014-09-02 DIAGNOSIS — Z954 Presence of other heart-valve replacement: Secondary | ICD-10-CM

## 2014-09-02 DIAGNOSIS — E1165 Type 2 diabetes mellitus with hyperglycemia: Secondary | ICD-10-CM

## 2014-09-02 DIAGNOSIS — I359 Nonrheumatic aortic valve disorder, unspecified: Secondary | ICD-10-CM

## 2014-09-02 DIAGNOSIS — Z952 Presence of prosthetic heart valve: Secondary | ICD-10-CM

## 2014-09-02 DIAGNOSIS — I6529 Occlusion and stenosis of unspecified carotid artery: Secondary | ICD-10-CM | POA: Insufficient documentation

## 2014-09-02 DIAGNOSIS — Z23 Encounter for immunization: Secondary | ICD-10-CM

## 2014-09-02 DIAGNOSIS — K5900 Constipation, unspecified: Secondary | ICD-10-CM

## 2014-09-02 DIAGNOSIS — K219 Gastro-esophageal reflux disease without esophagitis: Secondary | ICD-10-CM

## 2014-09-02 DIAGNOSIS — E785 Hyperlipidemia, unspecified: Secondary | ICD-10-CM | POA: Diagnosis not present

## 2014-09-02 DIAGNOSIS — I6523 Occlusion and stenosis of bilateral carotid arteries: Secondary | ICD-10-CM

## 2014-09-02 LAB — COMPLETE METABOLIC PANEL WITH GFR
ALBUMIN: 3.9 g/dL (ref 3.5–5.2)
ALT: 12 U/L (ref 0–53)
AST: 17 U/L (ref 0–37)
Alkaline Phosphatase: 127 U/L — ABNORMAL HIGH (ref 39–117)
BUN: 11 mg/dL (ref 6–23)
CALCIUM: 9.4 mg/dL (ref 8.4–10.5)
CO2: 24 meq/L (ref 19–32)
CREATININE: 0.73 mg/dL (ref 0.50–1.35)
Chloride: 100 mEq/L (ref 96–112)
GFR, Est African American: >89 mL/min
GFR, Est Non African American: >89 mL/min
Glucose, Bld: 113 mg/dL — ABNORMAL HIGH (ref 70–99)
Potassium: 4.2 mEq/L (ref 3.5–5.3)
SODIUM: 138 meq/L (ref 135–145)
TOTAL PROTEIN: 6.9 g/dL (ref 6.0–8.3)
Total Bilirubin: 1 mg/dL (ref 0.2–1.2)

## 2014-09-02 LAB — HEMOGLOBIN A1C
HEMOGLOBIN A1C: 6.8 % — AB (ref <5.7)
Mean Plasma Glucose: 148 mg/dL — ABNORMAL HIGH (ref <117)

## 2014-09-02 LAB — LIPID PANEL
CHOL/HDL RATIO: 5 ratio
CHOLESTEROL: 176 mg/dL (ref 0–200)
HDL: 35 mg/dL — AB (ref >39)
LDL Cholesterol: 108 mg/dL — ABNORMAL HIGH (ref 0–99)
Triglycerides: 165 mg/dL — ABNORMAL HIGH (ref <150)
VLDL: 33 mg/dL (ref 0–40)

## 2014-09-02 NOTE — Progress Notes (Signed)
Patient ID: [name] MRN: [id number], DOB: 19-Oct-1940, 74 y.o. Date of Encounter: @DATE @  Chief Complaint:  Chief Complaint  Patient presents with  . 3 mth check up    HPI: 74 y.o. year old white male  presents today for followup office visit.  He had hospitalization 04/2014.  On 9/8/2015e had aortic valve replacement secondary to severe aortic stenosis.  Had Normal coronaries by cardiac catheterization. Preoperatively he had low EF.  He was discharged from the hospital on 04/20/14. At follow-up office visit with me 05/26/14 he and his wife reported that he was discharged directly home-- did not have to go to any type of rehabilitation facility between. He had follow-up office visit with Dr. Darcey Nora 05/19/14. Patient reports that he has had no further follow-up with Dr. Darcey Nora. Pt says that at that visit Dr. Darcey Nora told him he only needed to follow-up PRN.  At patient's visit with me 05/26/14 we reviewed the fact that Dr. [name] Trigt's note discussed patient doing Cardiac Rehabilitation.  Patient states that he would have to pay a $50 copay each time he goes to Cardiac Rehab--cannot afford this.  At [place] 05/26/14 he said that he was able to walk up and down the street at his house and even walks around the block sometimes.  At office visit 09/02/14 asked him what he is doing for exercise since it has been cold and we have even had snow and ice in the past week. He says that he tries to walk some at [place].  He has only had one office visit at the Cardiology office since his hospitalization. That OV was with the Nurse Practitioner there on 05/25/14. He has had no other follow-up with a Cardiologist since the hospitalization.  I reviewed Dr. Thayer Ohm OV note and the Cardiology NP note  in Epic .  At his office visit with me 05/26/14, pt stated that they did give him metformin in the hospital but said that he had not been taking it since he got home because his blood sugars were  low. He stated that even off of Metformin his fasting morning blood sugars had been around 138 and 99. At that visit I told him to stay off of metformin. I reviewed the fact that he had experienced further weight loss with the hospitalization and surgery--I'm sure this is contributing to his decreased sugar.  No complaints or concerns today.    His last office visit with me prior to the hospitlaization was 02/22/2014. The following is copied from that office visit note.:  At his OV 09/28/2013  that time he presented for his routine 3 month checkup for diabetes hypertension hyperlipidemia et Ronney Asters.  However, at that visit I noticed that he looked even thinner that day than usual. I then noticed that I had noted at his prior visit with me 06/29/13 that he had also lost weight at the time of that visit. At that visit , I initially assumed that his weight loss was intentional  secondary to dietary changes to help control his diabetes.  However, at that visit 06/29/13 he reported that he decrease his dietary intake secondary to finances. At that visit he stated   "I just have to eat what I have.   I can't afford anything more."  Weight February 2014   was 167. Weight 06/29/13             was 155. Weight 09/28/13  was 147. Today's weight is again 147.  At his visit with me 09/28/13 he said that finances were not quite such a problem at that time. He stated that at the time of his prior visit in November he was in the Donut hole regarding paying for medications.  Also at his visit 09/28/13 he reported that he was having some problems with constipation over the prior 2 months. At that visit he reported that he " always had some problems with his bowels."   He stated that in the past he would go back and forth between feeling constipated having problems with diarrhea/loose stools. I do know that in the past we had to decrease his metformin down to his current dose of 500 mg daily because  any higher dose caused loose stools/diarrhea.  At the visit 09/28/13 I did labs to evaluate underlying medical causes of weight loss including CBC TSH and CME T. These were all normal.  We also discussed the fact he had a screening colonoscopy 06/03/2009 and he was told to repeat 10-15 years.  At that visit I gave him prescription and sample for Linzess.  Told him that if that was too expensive then he can get over-the-counter MiraLax and to start with 2 cap fulls daily . If He had no bowel movement with this after 3 days, then he was to call me for further dosing directions. I told him to increase his food intake and not worry about carbohydrates etc. regarding his sugar but simply just try to eat more food in general.  Was to schedule followup office visit with me in the next 1 to 2 weeks to reassess his weight and constipation. However, when we talked to him on the phone he stated he could not afford to pay for an office visit. We then recommended that he come in for a weight check free of charge.  Those encounters and recommendations are documented in the computer. See those for details.  He did come in for weight check and his weight was stable at that time. Say when he came in for that we checked our staff gave him some contact information --he could call for some assistance.  At [place] 09/28/13 he says that he is using [employer] Says that they get him through to every other Thursday. He gives very pick this up. Says that he has other phone numbers that he could call for food if he needs to but hasn't needed to so far.  He had colonoscopy by Dr. Jearld Adjutant on 12/12/13. There were polyps which were biopsied. External and internal hemorrhoids. At that time Dr. Jearld Adjutant planned to order CT of abdomen and pelvis as well as chest.  He had a CT scan on 12/09/13. No malignancy was seen on either CT scan. No significant finding seen on the CT scans.  Today he says that he gets food once a month  from 4 or 5 different places--- [employer] out [employer], [employer], [employer], their church.   Past Medical History  Diagnosis Date  . Hyperlipidemia   . Hypertension   . Vitamin D deficiency   . Diabetes mellitus   . GERD (gastroesophageal reflux disease)   . BPH (benign prostatic hypertrophy)   . History of cataract   . Constipation   . CAD (coronary artery disease)   . Aortic stenosis, mild   . Cancer     spindle cell cancer of left ear  . Nephrolithiasis  Home Meds: Outpatient Prescriptions Prior to Visit  Medication Sig Dispense Refill  . aspirin 81 MG EC tablet Take 1 tablet (81 mg total) by mouth daily. Swallow whole. 30 tablet 12  . Cholecalciferol (VITAMIN D) 2000 UNITS tablet Take 2,000 Units by mouth daily.    . fish oil-omega-3 fatty acids 1000 MG capsule Take 1 g by mouth daily.     Marland Kitchen lisinopril (PRINIVIL,ZESTRIL) 2.5 MG tablet Take 1 tablet (2.5 mg total) by mouth daily. 90 tablet 0  . metoprolol (LOPRESSOR) 50 MG tablet Take 1 tablet (50 mg total) by mouth 2 (two) times daily. 180 tablet 0  . pravastatin (PRAVACHOL) 40 MG tablet Take 1 tablet (40 mg total) by mouth daily. 90 tablet 0  . feeding supplement, ENSURE COMPLETE, (ENSURE COMPLETE) LIQD Take 237 mLs by mouth 2 (two) times daily between meals.    . traMADol (ULTRAM) 50 MG tablet Take 1 tablet (50 mg total) by mouth every 6 (six) hours as needed for severe pain. (Patient not taking: Reported on 09/02/2014) 30 tablet 0   No facility-administered medications prior to visit.     Allergies:  Allergies  Allergen Reactions  . Actos [Pioglitazone] Other (See Comments)    Cramps in legs   . Morphine     REACTION: hives  . Statins     REACTION: leg cramps but tolerates pravastatin    History   Social History  . Marital Status: Married    Spouse Name: N/A    Number of Children: N/A  . Years of Education: N/A   Occupational History  . Not on file.   Social History Main Topics  . Smoking  status: Former Smoker -- 1.00 packs/day for 30 years  . Smokeless tobacco: Current User    Types: Chew     Comment: Quit >15 years ago  . Alcohol Use: No  . Drug Use: No  . Sexual Activity: Not Currently   Other Topics Concern  . Not on file   Social History Narrative   Married for >44 years    Family History  Problem Relation Age of Onset  . Heart disease Mother   . Congestive Heart Failure Father      Review of Systems:  See HPI for pertinent ROS. All other ROS negative.    Physical Exam: Blood pressure 120/72, pulse 64, temperature 97.6 F (36.4 C), temperature source Oral, resp. rate 18, weight 140 lb (63.504 kg)., Body mass index is 21.29 kg/(m^2). General: Thin WM. Appears in no acute distress. Neck: Bilateral Carotid Bruits.  Lungs: Crackles at bases bilaterally. O/w clear.  Heart: RRR with S1 S2. No murmurs, rubs, or gallops. Musculoskeletal:  Strength and tone normal for age. Extremities/Skin: Warm and dry.  No edema.  Neuro: Alert and oriented X 3. Moves all extremities spontaneously. Gait is normal. CNII-XII grossly in tact. Psych:  Responds to questions appropriately with a normal affect.     ASSESSMENT AND PLAN:  74 y.o. year old male with   1. S/P AVR (aortic valve replacement)  2. Cardiomyopathy, dilated, nonischemic  3. Carotid artery stenosis, bilateral - US Carotid Duplex Bilateral; Future  In the past, cardiology was ordering his carotid artery ultrasounds. However, patient stopped following up with cardiology as he could not afford to pay the co-pay. Because of this financial situation, I was the one to order his last set of carotid artery Dopplers as well as echocardiogram. His last carotid artery ultrasound was 03/09/14. It showed 60-79% stenosis bilaterally.  Recommended repeat 6 months. Discussed this with the patient today.  He says that he will get a check the third week of the month and he can go for test after that. I placed the order today  but  Requested it be scheduled the last week of February.  4. Bilateral carotid bruits - US Carotid Duplex Bilateral; Future    5. Type 2 diabetes mellitus with hyperglycemia - COMPLETE METABOLIC PANEL WITH GFR - Hemoglobin A1c  Last microalbumin: 02/2014   He provides excellent foot care. Reminded him of proper foot care and wearing shoes at all times.  He is overdue for ophthalmology exam. He states that he cannot afford this. He has a high co-pay.  He is on ASA 81mg , on ACE inhibitor, and on statin therapy.  He has been off Metformin since hospitalization--see HPI.  6. Hyperlipidemia - COMPLETE METABOLIC PANEL WITH GFR - Lipid panel  7. Essential hypertension Blood pressure is at goal. Continue current medications. On ACE inhibitor and beta blocker. - COMPLETE METABOLIC PANEL WITH GFR  8. Weight Loss Has had evaluation by myself as well as Dr. Wonda Amis. I did verify with patient today and he says that he is drinking Ensure  2 per day. Postoperatively, he is gaining weight.  05/19/14 was 135 pounds. Today 09/02/14 he is 140 pounds.  8. CONSTIPATION  In past was treating with Linzess, Mirilax Not a current problem.   9. BENIGN PROSTATIC HYPERTROPHY, WITH OBSTRUCTION  A screening PSA was done 09/25/12 and was normal. He is now 74 years old so can stop doing screening PSA.   10. Screening colonoscopy: 06/03/2009. Repeat 10-15 years. Had Colonoscopy --Dr. Rehman--11/26/13--Polyps. Internal and External Hemorrhoids.  11. Immunizations:  Pneumonia vaccine: Patient reports that he had this at age 42 his prior PCP  .--He would have received  Pneumovax 23.  Given The new guidelines we will need to give him the Prevnar 13 Discussed this at his office visit 09/02/14 and he was agreeable to receive this. Prevnar  13 given here 09/02/14.  Tetanus vaccine: Patient recently checked with his insurance and is going to cost him $40 and he deferred.  Zostavax: Patient check with his  insurance. Too expensive. The patient defers.  Influenza vaccine: Received this 04/20/14   Ideally, he needs follow-up with cardiologist but he cannot afford the co-pay. Ideally, he needs to be in cardiac rehabilitation but he cannot afford the co-pay. Given the cold weather, I have told him that every time he goes to the grocery store or [place] etc. that even after he has completed all of his shopping to walk multiple laps around the store nonstop. He is agreeable to go for follow-up carotid artery duplex as long as it is the last week of February after he receives his check.  F/U 3 months, sooner if needed.   Signed,     Signed, [address] [place], [place], [facility] 09/02/2014 9:43 AM

## 2014-09-10 ENCOUNTER — Encounter: Payer: Self-pay | Admitting: Family Medicine

## 2014-09-28 ENCOUNTER — Other Ambulatory Visit: Payer: Self-pay | Admitting: Physician Assistant

## 2014-09-28 NOTE — Telephone Encounter (Signed)
Medication refilled per protocol. 

## 2014-10-01 ENCOUNTER — Ambulatory Visit (HOSPITAL_COMMUNITY): Payer: Medicare Other | Attending: Physician Assistant | Admitting: Cardiology

## 2014-10-01 DIAGNOSIS — R0989 Other specified symptoms and signs involving the circulatory and respiratory systems: Secondary | ICD-10-CM | POA: Diagnosis not present

## 2014-10-01 DIAGNOSIS — F17220 Nicotine dependence, chewing tobacco, uncomplicated: Secondary | ICD-10-CM | POA: Insufficient documentation

## 2014-10-01 DIAGNOSIS — E785 Hyperlipidemia, unspecified: Secondary | ICD-10-CM | POA: Insufficient documentation

## 2014-10-01 DIAGNOSIS — I6523 Occlusion and stenosis of bilateral carotid arteries: Secondary | ICD-10-CM | POA: Diagnosis not present

## 2014-10-01 DIAGNOSIS — E119 Type 2 diabetes mellitus without complications: Secondary | ICD-10-CM | POA: Insufficient documentation

## 2014-10-01 DIAGNOSIS — I10 Essential (primary) hypertension: Secondary | ICD-10-CM | POA: Diagnosis not present

## 2014-10-01 DIAGNOSIS — I2510 Atherosclerotic heart disease of native coronary artery without angina pectoris: Secondary | ICD-10-CM | POA: Diagnosis not present

## 2014-10-01 NOTE — Progress Notes (Signed)
Carotid duplex performed 

## 2014-10-20 ENCOUNTER — Telehealth: Payer: Self-pay | Admitting: *Deleted

## 2014-10-20 MED ORDER — PERMETHRIN 5 % EX CREA: 1.0000 "application " | TOPICAL_CREAM | Freq: Once | CUTANEOUS | Status: DC

## 2014-10-20 NOTE — Telephone Encounter (Signed)
Pt grandson has scabies,went to doctor this morning adn they gave him a cream to put on. Pt wants to know if you can prescribe her something to help her get rid of it for herself as well.  [facility] [address]   Per MBD Yes.  Also, make sure she knows about cleaning Towels, bedsheets all clothing they have worn etc and dry on high heat. Send Rx for: Permethrin Cream--apply cream from head (avoid mouth, nose, eyes) to soles of feet and wash after 8-14 hours-- dispense #60 g +0 refills.   Script called into pharmacy

## 2014-10-25 ENCOUNTER — Other Ambulatory Visit: Payer: Self-pay | Admitting: Physician Assistant

## 2014-11-22 ENCOUNTER — Encounter: Payer: Self-pay | Admitting: Adult Health

## 2014-11-22 ENCOUNTER — Ambulatory Visit (INDEPENDENT_AMBULATORY_CARE_PROVIDER_SITE_OTHER): Payer: Medicare Other | Admitting: Adult Health

## 2014-11-22 VITALS — BP 104/58 | HR 65 | Ht 68.0 in | Wt 141.8 lb

## 2014-11-22 DIAGNOSIS — I2510 Atherosclerotic heart disease of native coronary artery without angina pectoris: Secondary | ICD-10-CM | POA: Diagnosis not present

## 2014-11-22 MED ORDER — PRAVASTATIN SODIUM 40 MG PO TABS: 40.0000 mg | ORAL_TABLET | Freq: Every day | ORAL | Status: DC

## 2014-11-22 MED ORDER — LISINOPRIL 2.5 MG PO TABS: ORAL_TABLET | ORAL | Status: DC

## 2014-11-22 MED ORDER — METOPROLOL TARTRATE 50 MG PO TABS: ORAL_TABLET | ORAL | Status: DC

## 2014-11-22 NOTE — Patient Instructions (Signed)
Your physician wants you to follow-up in: 6 months with Kathryn Lawrence, NP. You will receive a reminder letter in the mail two months in advance. If you don't receive a letter, please call our office to schedule the follow-up appointment.  Your physician recommends that you continue on your current medications as directed. Please refer to the Current Medication list given to you today.  Thank you for choosing [facility]!   

## 2014-11-22 NOTE — Progress Notes (Deleted)
Name: [name]    DOB: [birth year]  Age: 74 y.o.  MR#: [id number]       PCP:  Karis Juba, PA-C      Insurance: Payor: [employer] MEDICARE / Plan: UHC MEDICARE / Product Type: *No Product type* /   CC:    Chief Complaint  Patient presents with  . Coronary Artery Disease  . Aortic Stenosis    VS Filed Vitals:   11/22/14 1317  BP: 104/58  Pulse: 65  Height: 5\' 8"  (1.727 m)  Weight: 141 lb 12.8 oz (64.32 kg)  SpO2: 98%    Weights Current Weight  11/22/14 141 lb 12.8 oz (64.32 kg)  09/02/14 140 lb (63.504 kg)  05/26/14 134 lb (60.782 kg)    Blood Pressure  BP Readings from Last 3 Encounters:  11/22/14 104/58  09/02/14 120/72  05/26/14 116/60     Admit date:  (Not on file) Last encounter with RMR:  Visit date not found   Allergy Actos; Morphine; and Statins  Current Outpatient Prescriptions  Medication Sig Dispense Refill  . aspirin 81 MG EC tablet Take 1 tablet (81 mg total) by mouth daily. Swallow whole. 30 tablet 12  . Cholecalciferol (VITAMIN D) 2000 UNITS tablet Take 2,000 Units by mouth daily.    Marland Kitchen lisinopril (PRINIVIL,ZESTRIL) 2.5 MG tablet TAKE 1 TABLET (2.5 MG TOTAL) BY MOUTH DAILY. 30 tablet 2  . metoprolol (LOPRESSOR) 50 MG tablet TAKE 1 TABLET (50 MG TOTAL) BY MOUTH 2 (TWO) TIMES DAILY. 60 tablet 2  . pravastatin (PRAVACHOL) 40 MG tablet Take 1 tablet (40 mg total) by mouth daily. 90 tablet 0  . feeding supplement, ENSURE COMPLETE, (ENSURE COMPLETE) LIQD Take 237 mLs by mouth 2 (two) times daily between meals.    . fish oil-omega-3 fatty acids 1000 MG capsule Take 1 g by mouth daily.      No current facility-administered medications for this visit.    Discontinued Meds:    Medications Discontinued During This Encounter  Medication Reason  . permethrin (ELIMITE) 5 % cream Error  . traMADol (ULTRAM) 50 MG tablet Error    Patient Active Problem List   Diagnosis Date Noted  . Carotid artery stenosis 09/02/2014  . Cardiomyopathy, dilated,  nonischemic 04/18/2014  . Postoperative atrial fibrillation 04/18/2014  . 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  . Aortic stenosis 04/08/2014  . Chronic systolic heart failure 93/26/7124  . Unstable angina 04/08/2014  . Chest pain 04/08/2014  . Personal history of colonic polyps 11/26/2013  . Loss of weight 11/02/2013  . Aortic valve disorder 07/11/2010  . LBBB 07/11/2010  . Bilateral carotid bruits 07/11/2010  . NEOPLASM OF UNCERTAIN BEHAVIOR OF SKIN 09/27/2008  . BENIGN PROSTATIC HYPERTROPHY, WITH OBSTRUCTION 09/27/2008  . KNEE PAIN 09/27/2008  . FLATULENCE ERUCTATION AND GAS PAIN 04/06/2008  . ALLERGIC RHINITIS 01/09/2008  . Constipation 10/17/2007  . HEADACHE 07/25/2007  . LEG CRAMPS 02/13/2007  . DYSPNEA ON EXERTION 01/10/2007  . Diabetes 10/10/2006  . Hyperlipidemia 10/10/2006  . Essential hypertension 10/10/2006  . GERD 10/10/2006  . CARDIAC MURMUR 10/10/2006  . CHEST PAIN, EXERTIONAL 10/10/2006    LABS    Component Value Date/Time   NA 138 09/02/2014 1000   NA 134* 04/27/2014 1133   NA 138 04/17/2014 1856   K 4.2 09/02/2014 1000   K 4.4 04/27/2014 1133   K 4.0 04/17/2014 1856   CL 100 09/02/2014 1000   CL 98 04/27/2014 1133   CL 100 04/17/2014 1856  CO2 24 09/02/2014 1000   CO2 26 04/27/2014 1133   CO2 28 04/17/2014 1856   GLUCOSE 113* 09/02/2014 1000   GLUCOSE 133* 04/27/2014 1133   GLUCOSE 123* 04/17/2014 1856   BUN 11 09/02/2014 1000   BUN 10 04/27/2014 1133   BUN 18 04/17/2014 1856   CREATININE 0.73 09/02/2014 1000   CREATININE 0.79 04/27/2014 1133   CREATININE 0.71 04/17/2014 1856   CREATININE 0.75 04/17/2014 0720   CREATININE 0.61 04/16/2014 0400   CREATININE 0.82 02/22/2014 0915   CALCIUM 9.4 09/02/2014 1000   CALCIUM 9.6 04/27/2014 1133   CALCIUM 8.4 04/17/2014 1856   GFRNONAA >89 09/02/2014 1000   GFRNONAA 88 04/27/2014 1133   GFRNONAA 90* 04/17/2014 1856   GFRNONAA 88* 04/17/2014 0720   GFRNONAA >90 04/16/2014 0400   GFRNONAA  87 02/22/2014 0915   GFRAA >89 09/02/2014 1000   GFRAA >89 04/27/2014 1133   GFRAA >90 04/17/2014 1856   GFRAA >90 04/17/2014 0720   GFRAA >90 04/16/2014 0400   GFRAA >89 02/22/2014 0915   CMP     Component Value Date/Time   NA 138 09/02/2014 1000   K 4.2 09/02/2014 1000   CL 100 09/02/2014 1000   CO2 24 09/02/2014 1000   GLUCOSE 113* 09/02/2014 1000   BUN 11 09/02/2014 1000   CREATININE 0.73 09/02/2014 1000   CREATININE 0.71 04/17/2014 1856   CALCIUM 9.4 09/02/2014 1000   PROT 6.9 09/02/2014 1000   ALBUMIN 3.9 09/02/2014 1000   AST 17 09/02/2014 1000   ALT 12 09/02/2014 1000   ALKPHOS 127* 09/02/2014 1000   BILITOT 1.0 09/02/2014 1000   GFRNONAA >89 09/02/2014 1000   GFRNONAA 90* 04/17/2014 1856   GFRAA >89 09/02/2014 1000   GFRAA >90 04/17/2014 1856       Component Value Date/Time   WBC 4.1 04/17/2014 0720   WBC 4.7 04/16/2014 0400   WBC 7.7 04/15/2014 1554   HGB 9.6* 04/17/2014 0720   HGB 9.1* 04/16/2014 0400   HGB 10.1* 04/15/2014 1554   HCT 28.1* 04/17/2014 0720   HCT 27.0* 04/16/2014 0400   HCT 29.6* 04/15/2014 1554   MCV 91.8 04/17/2014 0720   MCV 90.0 04/16/2014 0400   MCV 91.1 04/15/2014 1554    Lipid Panel     Component Value Date/Time   CHOL 176 09/02/2014 1000   TRIG 165* 09/02/2014 1000   HDL 35* 09/02/2014 1000   CHOLHDL 5.0 09/02/2014 1000   VLDL 33 09/02/2014 1000   LDLCALC 108* 09/02/2014 1000    ABG    Component Value Date/Time   PHART 7.363 04/14/2014 0145   PCO2ART 38.7 04/14/2014 0145   PO2ART 121.0* 04/14/2014 0145   HCO3 21.9 04/14/2014 0145   TCO2 22 04/14/2014 1737   ACIDBASEDEF 3.0* 04/14/2014 0145   O2SAT 98.0 04/14/2014 0145     Lab Results  Component Value Date   TSH 3.998 02/22/2014   BNP (last 3 results) No results for input(s): BNP in the last 8760 hours.  ProBNP (last 3 results) No results for input(s): PROBNP in the last 8760 hours.  Cardiac Panel (last 3 results) No results for input(s): CKTOTAL, CKMB,  TROPONINI, RELINDX in the last 72 hours.  Iron/TIBC/Ferritin/ %Sat No results found for: IRON, TIBC, FERRITIN, IRONPCTSAT   EKG Orders placed or performed during the hospital encounter of 04/08/14  . EKG 12-Lead  . EKG 12-Lead  . ED EKG  . EKG  . EKG 12-Lead  . EKG 12-Lead  . EKG  12-Lead  . EKG 12-Lead     Prior Assessment and Plan Problem List as of 11/22/2014      Cardiovascular and Mediastinum   Essential hypertension   Last Assessment & Plan 05/25/2014 Office Visit Written 05/25/2014  4:26 PM by Lendon Colonel, NP    Blood pressure is low normal. Currently. I will not make any changes in his medication regimen at this time.      Aortic valve disorder   Last Assessment & Plan 05/25/2014 Office Visit Written 05/25/2014  4:24 PM by Lendon Colonel, NP    Is doing well post aortic valve replacement. Sternotomy scar is well-healed without evidence of infection, bleeding, or erythema. The patient denies any severe pain. Although he is having still some soreness. He is not yet regained any energy, and he is having trouble keeping his appetite up. He states his breathing status is better, but he is having a congested cough. I will do a PA and lateral chest x-ray to evaluate for abnormalities. We will see the patient back in the office in 3 months unless he is symptomatic.      LBBB   Aortic stenosis   Chronic systolic heart failure   Last Assessment & Plan 05/25/2014 Office Visit Written 05/25/2014  4:25 PM by Lendon Colonel, NP    No overt evidence of fluid retention. I am hearing some mild crackles in the lungs, which may be related to bronchitis versus mild CHF. I do not see any lower extremity edema, his O2 sat is 99%. He was taken off of benazepril HCTZ, and metoprolol 25 mg prior to discharge. He remains on low-dose lisinopril, Lasix, 40 mg. We will need to repeat his echocardiogram in approximately 3-6 months to evaluate for changes in LV function.      Unstable  angina   Cardiomyopathy, dilated, nonischemic   Postoperative atrial fibrillation   Carotid artery stenosis     Respiratory   ALLERGIC RHINITIS     Digestive   GERD   Constipation     Endocrine   Diabetes     Musculoskeletal and Integument   NEOPLASM OF UNCERTAIN BEHAVIOR OF SKIN     Genitourinary   BENIGN PROSTATIC HYPERTROPHY, WITH OBSTRUCTION     Other   Hyperlipidemia   Last Assessment & Plan 10/22/2012 Office Visit Written 10/22/2012 11:40 AM by Lendon Colonel, NP    He has had recent lipid studies completed on 10/07/2012. Total cholesterol 149 HDL 32 triglycerides 204 LDL 76. I provided him with a copy of this for his own records. LFTs were found to be within normal limits. He offers no myalgia type symptoms. Continue him on current medication regimen without changes.      KNEE PAIN   LEG CRAMPS   HEADACHE   CARDIAC MURMUR   Bilateral carotid bruits   DYSPNEA ON EXERTION   Last Assessment & Plan 05/25/2014 Office Visit Written 05/25/2014  4:26 PM by Lendon Colonel, NP    This symptom has improved. He is now at [place] class I -ll. He is not extremely active. He is trying to get his energy back.      CHEST PAIN, EXERTIONAL   Last Assessment & Plan 09/25/2012 Office Visit Written 09/25/2012  1:24 PM by Lendon Colonel, NP    He had cardiac cath in 2008 demonstrating non-obstructive CAD at that time.With continued CVRF to include diabetes, insulin dependent, and recurrent symptoms, will have stress myoview to  evaluate for progression of CAD. He is willing to walk on treadmill. Will follow up with him in 2-3 weeks to discuss test results. With LBBB, EKG changes will be difficult to evaluate.       FLATULENCE ERUCTATION AND GAS PAIN   Loss of weight   Personal history of colonic polyps   Chest pain   S/P AVR (aortic valve replacement)       Imaging: No results found.
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
## 2014-11-24 ENCOUNTER — Telehealth: Payer: Self-pay | Admitting: Adult Health

## 2014-11-24 NOTE — Telephone Encounter (Signed)
Please see refill bin / tg  °

## 2014-12-06 ENCOUNTER — Emergency Department (HOSPITAL_COMMUNITY)
Admission: EM | Admit: 2014-12-06 | Discharge: 2014-12-06 | Disposition: A | Discharge status: Home / Self Care | Payer: Medicare Other | Attending: Emergency Medicine | Admitting: Emergency Medicine

## 2014-12-06 ENCOUNTER — Encounter (HOSPITAL_COMMUNITY): Payer: Self-pay | Admitting: Emergency Medicine

## 2014-12-06 DIAGNOSIS — Z87438 Personal history of other diseases of male genital organs: Secondary | ICD-10-CM | POA: Insufficient documentation

## 2014-12-06 DIAGNOSIS — W25XXXA Contact with sharp glass, initial encounter: Secondary | ICD-10-CM | POA: Insufficient documentation

## 2014-12-06 DIAGNOSIS — Z8719 Personal history of other diseases of the digestive system: Secondary | ICD-10-CM | POA: Diagnosis not present

## 2014-12-06 DIAGNOSIS — Z87442 Personal history of urinary calculi: Secondary | ICD-10-CM | POA: Diagnosis not present

## 2014-12-06 DIAGNOSIS — I2510 Atherosclerotic heart disease of native coronary artery without angina pectoris: Secondary | ICD-10-CM | POA: Diagnosis not present

## 2014-12-06 DIAGNOSIS — Z23 Encounter for immunization: Secondary | ICD-10-CM | POA: Insufficient documentation

## 2014-12-06 DIAGNOSIS — Z7982 Long term (current) use of aspirin: Secondary | ICD-10-CM | POA: Diagnosis not present

## 2014-12-06 DIAGNOSIS — Y9289 Other specified places as the place of occurrence of the external cause: Secondary | ICD-10-CM | POA: Diagnosis not present

## 2014-12-06 DIAGNOSIS — Z87891 Personal history of nicotine dependence: Secondary | ICD-10-CM | POA: Diagnosis not present

## 2014-12-06 DIAGNOSIS — E119 Type 2 diabetes mellitus without complications: Secondary | ICD-10-CM | POA: Insufficient documentation

## 2014-12-06 DIAGNOSIS — E785 Hyperlipidemia, unspecified: Secondary | ICD-10-CM | POA: Insufficient documentation

## 2014-12-06 DIAGNOSIS — Z85828 Personal history of other malignant neoplasm of skin: Secondary | ICD-10-CM | POA: Insufficient documentation

## 2014-12-06 DIAGNOSIS — Z9889 Other specified postprocedural states: Secondary | ICD-10-CM | POA: Diagnosis not present

## 2014-12-06 DIAGNOSIS — S61219A Laceration without foreign body of unspecified finger without damage to nail, initial encounter: Secondary | ICD-10-CM

## 2014-12-06 DIAGNOSIS — Z79899 Other long term (current) drug therapy: Secondary | ICD-10-CM | POA: Diagnosis not present

## 2014-12-06 DIAGNOSIS — S61215A Laceration without foreign body of left ring finger without damage to nail, initial encounter: Secondary | ICD-10-CM | POA: Diagnosis not present

## 2014-12-06 DIAGNOSIS — I10 Essential (primary) hypertension: Secondary | ICD-10-CM | POA: Diagnosis not present

## 2014-12-06 DIAGNOSIS — Y9389 Activity, other specified: Secondary | ICD-10-CM | POA: Insufficient documentation

## 2014-12-06 DIAGNOSIS — Y998 Other external cause status: Secondary | ICD-10-CM | POA: Diagnosis not present

## 2014-12-06 MED ORDER — TETANUS-DIPHTH-ACELL PERTUSSIS 5-2.5-18.5 LF-MCG/0.5 IM SUSP
0.5000 mL | Freq: Once | INTRAMUSCULAR | Status: AC
  Administered 2014-12-06: 0.5 mL via INTRAMUSCULAR
  Filled 2014-12-06: qty 0.5

## 2014-12-06 MED ORDER — LIDOCAINE HCL (PF) 1 % IJ SOLN
5.0000 mL | Freq: Once | INTRAMUSCULAR | Status: AC
  Administered 2014-12-06: 5 mL via INTRADERMAL

## 2014-12-06 MED ORDER — LIDOCAINE HCL (PF) 1 % IJ SOLN
INTRAMUSCULAR | Status: AC
  Filled 2014-12-06: qty 5
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
## 2014-12-06 NOTE — ED Notes (Signed)
Suture tray at bedside.  

## 2014-12-06 NOTE — ED Notes (Signed)
Pt reports cut left ring finger on a piece of glass this am. Minimal bleeding noted in triage.

## 2014-12-06 NOTE — Discharge Instructions (Signed)

## 2014-12-06 NOTE — ED Notes (Signed)
Covered wound with sterile tefla and wrapped in sterile gauze. Patient tolerated well.

## 2014-12-09 ENCOUNTER — Ambulatory Visit: Payer: Medicare Other | Admitting: Physician Assistant

## 2014-12-16 ENCOUNTER — Emergency Department (HOSPITAL_COMMUNITY)
Admission: EM | Admit: 2014-12-16 | Discharge: 2014-12-16 | Disposition: A | Discharge status: Home / Self Care | Payer: Medicare Other | Attending: Emergency Medicine | Admitting: Emergency Medicine

## 2014-12-16 ENCOUNTER — Encounter (HOSPITAL_COMMUNITY): Payer: Self-pay | Admitting: *Deleted

## 2014-12-16 DIAGNOSIS — Z9841 Cataract extraction status, right eye: Secondary | ICD-10-CM | POA: Insufficient documentation

## 2014-12-16 DIAGNOSIS — I10 Essential (primary) hypertension: Secondary | ICD-10-CM | POA: Diagnosis not present

## 2014-12-16 DIAGNOSIS — Z9842 Cataract extraction status, left eye: Secondary | ICD-10-CM | POA: Insufficient documentation

## 2014-12-16 DIAGNOSIS — Z9889 Other specified postprocedural states: Secondary | ICD-10-CM | POA: Diagnosis not present

## 2014-12-16 DIAGNOSIS — Z7982 Long term (current) use of aspirin: Secondary | ICD-10-CM | POA: Insufficient documentation

## 2014-12-16 DIAGNOSIS — E785 Hyperlipidemia, unspecified: Secondary | ICD-10-CM | POA: Diagnosis not present

## 2014-12-16 DIAGNOSIS — Z85828 Personal history of other malignant neoplasm of skin: Secondary | ICD-10-CM | POA: Diagnosis not present

## 2014-12-16 DIAGNOSIS — Z4802 Encounter for removal of sutures: Secondary | ICD-10-CM | POA: Diagnosis not present

## 2014-12-16 DIAGNOSIS — E119 Type 2 diabetes mellitus without complications: Secondary | ICD-10-CM | POA: Insufficient documentation

## 2014-12-16 DIAGNOSIS — I2510 Atherosclerotic heart disease of native coronary artery without angina pectoris: Secondary | ICD-10-CM | POA: Insufficient documentation

## 2014-12-16 DIAGNOSIS — Z87442 Personal history of urinary calculi: Secondary | ICD-10-CM | POA: Insufficient documentation

## 2014-12-16 DIAGNOSIS — E559 Vitamin D deficiency, unspecified: Secondary | ICD-10-CM | POA: Insufficient documentation

## 2014-12-16 DIAGNOSIS — R011 Cardiac murmur, unspecified: Secondary | ICD-10-CM | POA: Diagnosis not present

## 2014-12-16 DIAGNOSIS — Z79899 Other long term (current) drug therapy: Secondary | ICD-10-CM | POA: Insufficient documentation

## 2014-12-16 DIAGNOSIS — Z87448 Personal history of other diseases of urinary system: Secondary | ICD-10-CM | POA: Diagnosis not present

## 2014-12-16 DIAGNOSIS — Z8719 Personal history of other diseases of the digestive system: Secondary | ICD-10-CM | POA: Diagnosis not present

## 2014-12-16 NOTE — ED Notes (Signed)
Pt states sutures placed to left ring finger on 5/2. Here to have them removed. NAD.
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
## 2014-12-16 NOTE — Discharge Instructions (Signed)

## 2014-12-29 ENCOUNTER — Encounter: Payer: Self-pay | Admitting: Physician Assistant

## 2014-12-29 ENCOUNTER — Ambulatory Visit (INDEPENDENT_AMBULATORY_CARE_PROVIDER_SITE_OTHER): Payer: Medicare Other | Admitting: Physician Assistant

## 2014-12-29 VITALS — BP 106/66 | HR 60 | Temp 98.0°F | Resp 18 | Wt 146.0 lb

## 2014-12-29 DIAGNOSIS — I359 Nonrheumatic aortic valve disorder, unspecified: Secondary | ICD-10-CM

## 2014-12-29 DIAGNOSIS — I10 Essential (primary) hypertension: Secondary | ICD-10-CM | POA: Diagnosis not present

## 2014-12-29 DIAGNOSIS — I429 Cardiomyopathy, unspecified: Secondary | ICD-10-CM

## 2014-12-29 DIAGNOSIS — I420 Dilated cardiomyopathy: Secondary | ICD-10-CM

## 2014-12-29 DIAGNOSIS — E785 Hyperlipidemia, unspecified: Secondary | ICD-10-CM

## 2014-12-29 DIAGNOSIS — E1165 Type 2 diabetes mellitus with hyperglycemia: Secondary | ICD-10-CM

## 2014-12-29 DIAGNOSIS — I6523 Occlusion and stenosis of bilateral carotid arteries: Secondary | ICD-10-CM | POA: Diagnosis not present

## 2014-12-29 DIAGNOSIS — I5022 Chronic systolic (congestive) heart failure: Secondary | ICD-10-CM | POA: Diagnosis not present

## 2014-12-29 DIAGNOSIS — R0989 Other specified symptoms and signs involving the circulatory and respiratory systems: Secondary | ICD-10-CM

## 2014-12-29 DIAGNOSIS — Z954 Presence of other heart-valve replacement: Secondary | ICD-10-CM | POA: Diagnosis not present

## 2014-12-29 DIAGNOSIS — D692 Other nonthrombocytopenic purpura: Secondary | ICD-10-CM

## 2014-12-29 DIAGNOSIS — K5900 Constipation, unspecified: Secondary | ICD-10-CM | POA: Diagnosis not present

## 2014-12-29 DIAGNOSIS — Z952 Presence of prosthetic heart valve: Secondary | ICD-10-CM

## 2014-12-29 LAB — HEMOGLOBIN A1C, FINGERSTICK: HEMOGLOBIN A1C, FINGERSTICK: 6.5 % — AB (ref <5.7)

## 2014-12-29 MED ORDER — FLUTICASONE PROPIONATE 50 MCG/ACT NA SUSP: 2.0000 | Freq: Every day | NASAL | Status: DC

## 2014-12-29 NOTE — Progress Notes (Signed)
Patient ID: [name] MRN: [id number], DOB: [birth year], 74 y.o. Date of Encounter: @DATE @  Chief Complaint:  Chief Complaint  Patient presents with  . 3 mth check up    not fasting    HPI: 74 y.o. year old white male  presents today for routine followup office visit.   On 04/13/2014 he had aortic valve replacement secondary to severe aortic stenosis.  Had Normal coronaries by cardiac catheterization. Preoperatively he had low EF.  At his office visit with me 05/26/14, pt stated that they did give him metformin in the hospital, but said that he had not been taking it since he got home because his blood sugars were low.  He stated that even off of Metformin, his fasting morning blood sugars had been around 138 and 99. At that visit I told him to stay off of metformin. I reviewed the fact that he had experienced further weight loss with the hospitalization and surgery--I'm sure this is contributing to his decreased sugar.  AT OV 12/2014: He states that he checks his blood sugar about once a week. Sometimes fasting or sometimes during the day.  Says this morning fasting reading was 130. Says that he usually gets around 100 to 130 for fasting readings.  States that he takes his dog for a walk every morning. They usually go around the block and then back around. Says that he feels pretty good with this exertion.  He is taking medications as directed with no adverse effects. No lightheadedness. No presyncope.  Says he has had some nasal congestion but what comes out is just watery clear.  No other complaints or concerns today.    His last office visit with me prior to the hospitlaization was 02/22/2014. The following is copied from that office visit note.:   At his routine OV 09/28/2013--at that visit I noticed that he looked even thinner that day than usual. I then noticed that I had noted at his prior visit with me 06/29/13 that he had also lost weight at the time of that  visit. At that visit , I initially assumed that his weight loss was intentional  secondary to dietary changes to help control his diabetes.  However, at that visit 06/29/13 he reported that he decrease his dietary intake secondary to finances. At that visit he stated   "I just have to eat what I have.   I can't afford anything more."  Weight February 2014   was 167. Weight 06/29/13             was 155. Weight 09/28/13               was 147.   09/28/13---checked labs to evaluate underlying medical causes of weight loss including CBC TSH and CME T. These were all normal.  We also discussed the fact he had a screening colonoscopy 06/03/2009 and he was told to repeat 10-15 years.   He did come in for weight check and his weight was stable at that time. Say when he came in for that we checked our staff gave him some contact information --he could call for some assistance.  At [place] 09/28/13 he says that he is using [employer] Says that they get him through to every other Thursday. He gives very pick this up. Says that he has other phone numbers that he could call for food if he needs to but hasn't needed to so far.  He had colonoscopy by Dr.  Reimann on 12/12/13. There were polyps which were biopsied. External and internal hemorrhoids. At that time Dr. Wonda Amis planned to order CT of abdomen and pelvis as well as chest.  He had a CT scan on 12/09/13. No malignancy was seen on either CT scan. No significant finding seen on the CT scans.  Today he says that he continues to  get food once a month from 4 or 5 different places--- [employer] out reach, [employer], [employer], their church.  Weight today 12/29/14 remains stable at 146.  At visit 12/29/14 I also reviewed last cardiology note.  Saw NP at Cardiology 11/22/14. Things were stable at that visit and he was to continue current medications. No labs or tests were indicated/  ordered at that time and they plan for follow-up 6 months.  Past  Medical History  Diagnosis Date  . Hyperlipidemia   . Hypertension   . Vitamin D deficiency   . Diabetes mellitus   . GERD (gastroesophageal reflux disease)   . BPH (benign prostatic hypertrophy)   . History of cataract   . Constipation   . CAD (coronary artery disease)   . Aortic stenosis, mild   . Cancer     spindle cell cancer of left ear  . Nephrolithiasis      Home Meds: Outpatient Prescriptions Prior to Visit  Medication Sig Dispense Refill  . aspirin 81 MG EC tablet Take 1 tablet (81 mg total) by mouth daily. Swallow whole. 30 tablet 12  . Cholecalciferol (VITAMIN D) 2000 UNITS tablet Take 2,000 Units by mouth daily.    . fish oil-omega-3 fatty acids 1000 MG capsule Take 1 g by mouth daily.     Marland Kitchen lisinopril (PRINIVIL,ZESTRIL) 2.5 MG tablet TAKE 1 TABLET (2.5 MG TOTAL) BY MOUTH DAILY. (Patient taking differently: Take 2.5 mg by mouth daily. TAKE 1 TABLET (2.5 MG TOTAL) BY MOUTH DAILY.) 90 tablet 3  . metoprolol (LOPRESSOR) 50 MG tablet TAKE 1 TABLET (50 MG TOTAL) BY MOUTH 2 (TWO) TIMES DAILY. (Patient taking differently: Take 50 mg by mouth daily. TAKE 1 TABLET (50 MG TOTAL) BY MOUTH 2 (TWO) TIMES DAILY.) 180 tablet 3  . pravastatin (PRAVACHOL) 40 MG tablet Take 1 tablet (40 mg total) by mouth daily. 90 tablet 3   No facility-administered medications prior to visit.     Allergies:  Allergies  Allergen Reactions  . Actos [Pioglitazone] Other (See Comments)    Cramps in legs   . Morphine     REACTION: hives  . Statins     REACTION: leg cramps but tolerates pravastatin    History   Social History  . Marital Status: Married    Spouse Name: N/A  . Number of Children: N/A  . Years of Education: N/A   Occupational History  . Not on file.   Social History Main Topics  . Smoking status: Former Smoker -- 1.00 packs/day for 30 years  . Smokeless tobacco: Current User    Types: Chew     Comment: Quit >15 years ago  . Alcohol Use: No  . Drug Use: No  . Sexual  Activity: Not Currently   Other Topics Concern  . Not on file   Social History Narrative   Married for >44 years    Family History  Problem Relation Age of Onset  . Heart disease Mother   . Congestive Heart Failure Father      Review of Systems:  See HPI for pertinent ROS. All other ROS negative.  Physical Exam: Blood pressure 106/66, pulse 60, temperature 98 F (36.7 C), temperature source Oral, resp. rate 18, weight 146 lb (66.225 kg)., Body mass index is 22.2 kg/(m^2). General: Thin WM. Appears in no acute distress. Neck: Bilateral Carotid Bruits.  Lungs: Crackles at bases bilaterally. O/w clear.  Heart: RRR with S1 S2. I/VI murmur at 2nd ICS on Right Musculoskeletal:  Strength and tone normal for age. Extremities/Skin: Warm and dry.  No edema.  Left Forearm with 1 inch diameter area of deep reddish-purplish purpura. Smaller areas scattered on bilateral forearms as well.  Neuro: Alert and oriented X 3. Moves all extremities spontaneously. Gait is normal. CNII-XII grossly in tact. Psych:  Responds to questions appropriately with a normal affect.     ASSESSMENT AND PLAN:  74 y.o. year old male with   1. S/P AVR (aortic valve replacement)  2. Cardiomyopathy, dilated, nonischemic  3. Carotid artery stenosis, bilateral  In the past, cardiology was ordering his carotid artery ultrasounds. However, patient stopped following up with cardiology as he could not afford to pay the co-pay. Because of this financial situation, I  Ordered Echo and carotid artery Dopplers more recently.  And I ordered another set of carotid Dopplers which was performed on 10/01/14. Stable. 60-79% bilateral ICA stenosis. Follow-up one year.  4. Bilateral carotid bruits - see #3 above    5. Type 2 diabetes mellitus with hyperglycemia - Hemoglobin A1c  Last microalbumin: 02/2014   He provides excellent foot care. Reminded him of proper foot care and wearing shoes at all times.  He is  overdue for ophthalmology exam. He states that he cannot afford this. He has a high co-pay.  He is on ASA 81mg , on ACE inhibitor, and on statin therapy.  He has been off Metformin since hospitalization--see HPI.  6. Hyperlipidemia Last FLP and LFTs were 09/02/14. That time LDL was up just a little bit and patient did state that he had been out of his pravastatin recently but was picking up refill. Today patient states that he has restarted the pravastatin and is taking it as directed.  7. Essential hypertension Blood pressure is at goal. Continue current medications. On ACE inhibitor and beta blocker.  8. Weight Loss Has had evaluation by myself as well as Dr. Wonda Amis. Weight is stable.  8. CONSTIPATION  In past was treating with Linzess, Mirilax Not a current problem.   9. 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 Also he is on aspirin 81 mg daily and this is also contributing to this.   9. BENIGN PROSTATIC HYPERTROPHY, WITH OBSTRUCTION  A screening PSA was done 09/25/12 and was normal. He is now 74 years old so can stop doing screening PSA.   10. Screening colonoscopy: 06/03/2009. Repeat 10-15 years. Had Colonoscopy --Dr. Rehman--11/26/13--Polyps. Internal and External Hemorrhoids.  11. Immunizations:  Pneumonia vaccine: Patient reports that he had this at age 22 his prior PCP  .--He would have received  Pneumovax 23.   Prevnar  13 given here 09/02/14.  Tetanus vaccine: Patient recently checked with his insurance and is going to cost him $40 and he deferred.  Zostavax: Patient check with his insurance. Too expensive. The patient defers.  Influenza vaccine: Received this 04/20/14    At office visit 12/29/14 I sent in prescription for Flonase to use for his nasal congestion. F/U 3 months, sooner if needed.   Signed,     Signed, Olean Ree [place], [place],  BSFM 12/29/2014 11:27 AM

## 2015-04-04 ENCOUNTER — Encounter: Payer: Self-pay | Admitting: Physician Assistant

## 2015-04-04 ENCOUNTER — Ambulatory Visit (INDEPENDENT_AMBULATORY_CARE_PROVIDER_SITE_OTHER): Payer: Medicare Other | Admitting: Physician Assistant

## 2015-04-04 ENCOUNTER — Other Ambulatory Visit: Payer: Self-pay | Admitting: Physician Assistant

## 2015-04-04 VITALS — BP 100/78 | HR 60 | Temp 97.6°F | Resp 16 | Wt 147.0 lb

## 2015-04-04 DIAGNOSIS — E038 Other specified hypothyroidism: Secondary | ICD-10-CM | POA: Diagnosis not present

## 2015-04-04 DIAGNOSIS — R0989 Other specified symptoms and signs involving the circulatory and respiratory systems: Secondary | ICD-10-CM

## 2015-04-04 DIAGNOSIS — E785 Hyperlipidemia, unspecified: Secondary | ICD-10-CM

## 2015-04-04 DIAGNOSIS — E1165 Type 2 diabetes mellitus with hyperglycemia: Secondary | ICD-10-CM | POA: Diagnosis not present

## 2015-04-04 DIAGNOSIS — I359 Nonrheumatic aortic valve disorder, unspecified: Secondary | ICD-10-CM | POA: Diagnosis not present

## 2015-04-04 DIAGNOSIS — Z954 Presence of other heart-valve replacement: Secondary | ICD-10-CM | POA: Diagnosis not present

## 2015-04-04 DIAGNOSIS — K5900 Constipation, unspecified: Secondary | ICD-10-CM | POA: Diagnosis not present

## 2015-04-04 DIAGNOSIS — D692 Other nonthrombocytopenic purpura: Secondary | ICD-10-CM

## 2015-04-04 DIAGNOSIS — Z0000 Encounter for general adult medical examination without abnormal findings: Secondary | ICD-10-CM

## 2015-04-04 DIAGNOSIS — I5022 Chronic systolic (congestive) heart failure: Secondary | ICD-10-CM

## 2015-04-04 DIAGNOSIS — I420 Dilated cardiomyopathy: Secondary | ICD-10-CM

## 2015-04-04 DIAGNOSIS — I10 Essential (primary) hypertension: Secondary | ICD-10-CM

## 2015-04-04 DIAGNOSIS — Z952 Presence of prosthetic heart valve: Secondary | ICD-10-CM

## 2015-04-04 DIAGNOSIS — I429 Cardiomyopathy, unspecified: Secondary | ICD-10-CM

## 2015-04-04 DIAGNOSIS — I6523 Occlusion and stenosis of bilateral carotid arteries: Secondary | ICD-10-CM

## 2015-04-04 LAB — LIPID PANEL
Cholesterol: 178 mg/dL (ref 125–200)
HDL: 35 mg/dL — ABNORMAL LOW (ref >=40)
LDL Cholesterol: 106 mg/dL (ref <130)
Total CHOL/HDL Ratio: 5.1 Ratio — ABNORMAL HIGH (ref <=5.0)
Triglycerides: 185 mg/dL — ABNORMAL HIGH (ref <150)
VLDL: 37 mg/dL — ABNORMAL HIGH (ref <30)

## 2015-04-04 LAB — COMPLETE METABOLIC PANEL WITH GFR
ALT: 18 U/L (ref 9–46)
AST: 18 U/L (ref 10–35)
Albumin: 4.1 g/dL (ref 3.6–5.1)
Alkaline Phosphatase: 74 U/L (ref 40–115)
BUN: 12 mg/dL (ref 7–25)
CHLORIDE: 101 mmol/L (ref 98–110)
CO2: 29 mmol/L (ref 20–31)
CREATININE: 0.77 mg/dL (ref 0.70–1.18)
Calcium: 9.5 mg/dL (ref 8.6–10.3)
GFR, Est Non African American: 89 mL/min (ref >=60)
Glucose, Bld: 126 mg/dL — ABNORMAL HIGH (ref 70–99)
Potassium: 4.2 mmol/L (ref 3.5–5.3)
SODIUM: 138 mmol/L (ref 135–146)
Total Bilirubin: 0.9 mg/dL (ref 0.2–1.2)
Total Protein: 6.6 g/dL (ref 6.1–8.1)

## 2015-04-04 LAB — CBC WITH DIFFERENTIAL/PLATELET
BASOS ABS: 0.1 10*3/uL (ref 0.0–0.1)
BASOS PCT: 1 % (ref 0–1)
EOS ABS: 0.1 10*3/uL (ref 0.0–0.7)
Eosinophils Relative: 2 % (ref 0–5)
HCT: 45 % (ref 39.0–52.0)
Hemoglobin: 15 g/dL (ref 13.0–17.0)
Lymphocytes Relative: 30 % (ref 12–46)
Lymphs Abs: 1.9 10*3/uL (ref 0.7–4.0)
MCH: 30.7 pg (ref 26.0–34.0)
MCHC: 33.3 g/dL (ref 30.0–36.0)
MCV: 92 fL (ref 78.0–100.0)
MPV: 11.6 fL (ref 8.6–12.4)
Monocytes Absolute: 0.6 10*3/uL (ref 0.1–1.0)
Monocytes Relative: 9 % (ref 3–12)
Neutro Abs: 3.7 10*3/uL (ref 1.7–7.7)
Neutrophils Relative %: 58 % (ref 43–77)
PLATELETS: 122 10*3/uL — AB (ref 150–400)
RBC: 4.89 MIL/uL (ref 4.22–5.81)
RDW: 15 % (ref 11.5–15.5)
WBC: 6.4 10*3/uL (ref 4.0–10.5)

## 2015-04-04 LAB — HEMOGLOBIN A1C
Hgb A1c MFr Bld: 6.7 % — ABNORMAL HIGH (ref <5.7)
MEAN PLASMA GLUCOSE: 146 mg/dL — AB (ref <117)

## 2015-04-04 LAB — TSH: TSH: 6.128 u[IU]/mL — AB (ref 0.350–4.500)

## 2015-04-04 NOTE — Progress Notes (Signed)
Patient ID: [name] MRN: [id number], DOB: September 22, 1940, 74 y.o. Date of Encounter: @DATE @  Chief Complaint:  Chief Complaint  Patient presents with  . Annual Exam    CPE, having problems with left hip when sits on it awile    HPI: 74 y.o. year old white male  presents today for routine followup office visit.   On 04/13/2014 he had aortic valve replacement secondary to severe aortic stenosis.  Had Normal coronaries by cardiac catheterization. Preoperatively he had low EF.  At his office visit with me 05/26/14, pt stated that they did give him metformin in the hospital, but said that he had not been taking it since he got home because his blood sugars were low.  He stated that even off of Metformin, his fasting morning blood sugars had been around 138 and 99. At that visit I told him to stay off of metformin. I reviewed the fact that he had experienced further weight loss with the hospitalization and surgery--I'm sure this is contributing to his decreased sugar.  AT OV 12/2014: He states that he checks his blood sugar about once a week. Sometimes fasting or sometimes during the day.  Says this morning fasting reading was 130. Says that he usually gets around 100 to 130 for fasting readings.  States that he takes his dog for a walk every morning. They usually go around the block and then back around. Says that he feels pretty good with this exertion.  He is taking medications as directed with no adverse effects. No lightheadedness. No presyncope.  His last office visit with me prior to the hospitlaization was 02/22/2014. The following is copied from that office visit note.:  At his routine OV 09/28/2013--at that visit I noticed that he looked even thinner that day than usual. I then noticed that I had noted at his prior visit with me 06/29/13 that he had also lost weight at the time of that visit. At that visit , I initially assumed that his weight loss was intentional  secondary  to dietary changes to help control his diabetes.  However, at that visit 06/29/13 he reported that he decrease his dietary intake secondary to finances. At that visit he stated   "I just have to eat what I have.   I can't afford anything more."  Weight February 2014   was 167. Weight 06/29/13             was 155. Weight 09/28/13               was 147.  09/28/13---checked labs to evaluate underlying medical causes of weight loss including CBC TSH and CME T. These were all normal.  We also discussed the fact he had a screening colonoscopy 06/03/2009 and he was told to repeat 10-15 years.  He did come in for weight check and his weight was stable at that time. Say when he came in for that we checked our staff gave him some contact information --he could call for some assistance.  At [place] 09/28/13 he says that he is using [employer] Says that they get him through to every other Thursday. He gives very pick this up. Says that he has other phone numbers that he could call for food if he needs to but hasn't needed to so far.  He had colonoscopy by Dr. Jearld Adjutant on 12/12/13. There were polyps which were biopsied. External and internal hemorrhoids. At that time Dr. Wonda Amis planned to order  CT of abdomen and pelvis as well as chest.  He had a CT scan on 12/09/13. No malignancy was seen on either CT scan. No significant finding seen on the CT scans.  Today he says that he continues to  get food once a month from 4 or 5 different places--- [employer] out reach, [employer], [employer], their church.  Weight 12/29/14 remains stable at 146.  At visit 12/29/14 I also reviewed last cardiology note.  Saw NP at Cardiology 11/22/14. Things were stable at that visit and he was to continue current medications. No labs or tests were indicated/ ordered at that time and they plan for follow-up 6 months.  04/04/2015:  He reports that sometimes when he first stands up, his left "hip" "is stiff--takes a minute to  straighten up". Says there is no pain, just stiffness.  Does not want to pursue imaging, further eval right now sec to finances--will monitor. No other complaints or concerns. Has been feeling pretty good. Still checks blood sugar once a week--fasting---gets 120s, 133, "something like that"  Past Medical History  Diagnosis Date  . Hyperlipidemia   . Hypertension   . Vitamin D deficiency   . Diabetes mellitus   . GERD (gastroesophageal reflux disease)   . BPH (benign prostatic hypertrophy)   . History of cataract   . Constipation   . CAD (coronary artery disease)   . Aortic stenosis, mild   . Cancer     spindle cell cancer of left ear  . Nephrolithiasis      Home Meds: Outpatient Prescriptions Prior to Visit  Medication Sig Dispense Refill  . aspirin 81 MG EC tablet Take 1 tablet (81 mg total) by mouth daily. Swallow whole. 30 tablet 12  . Cholecalciferol (VITAMIN D) 2000 UNITS tablet Take 2,000 Units by mouth daily.    . fish oil-omega-3 fatty acids 1000 MG capsule Take 1 g by mouth daily.     . fluticasone (FLONASE) 50 MCG/ACT nasal spray Place 2 sprays into both nostrils daily. 16 g 6  . lisinopril (PRINIVIL,ZESTRIL) 2.5 MG tablet TAKE 1 TABLET (2.5 MG TOTAL) BY MOUTH DAILY. (Patient taking differently: Take 2.5 mg by mouth daily. TAKE 1 TABLET (2.5 MG TOTAL) BY MOUTH DAILY.) 90 tablet 3  . metoprolol (LOPRESSOR) 50 MG tablet TAKE 1 TABLET (50 MG TOTAL) BY MOUTH 2 (TWO) TIMES DAILY. (Patient taking differently: Take 50 mg by mouth daily. TAKE 1 TABLET (50 MG TOTAL) BY MOUTH 2 (TWO) TIMES DAILY.) 180 tablet 3  . pravastatin (PRAVACHOL) 40 MG tablet Take 1 tablet (40 mg total) by mouth daily. 90 tablet 3   No facility-administered medications prior to visit.     Allergies:  Allergies  Allergen Reactions  . Actos [Pioglitazone] Other (See Comments)    Cramps in legs   . Morphine     REACTION: hives  . Statins     REACTION: leg cramps but tolerates pravastatin    Social  History   Social History  . Marital Status: Married    Spouse Name: N/A  . Number of Children: N/A  . Years of Education: N/A   Occupational History  . Not on file.   Social History Main Topics  . Smoking status: Former Smoker -- 1.00 packs/day for 30 years  . Smokeless tobacco: Current User    Types: Chew     Comment: Quit >15 years ago  . Alcohol Use: No  . Drug Use: No  . Sexual Activity: Not Currently  Other Topics Concern  . Not on file   Social History Narrative   Married for >44 years    Family History  Problem Relation Age of Onset  . Heart disease Mother   . Congestive Heart Failure Father      Review of Systems:  See HPI for pertinent ROS. All other ROS negative.    Physical Exam: Blood pressure 100/78, pulse 60, temperature 97.6 F (36.4 C), temperature source Oral, resp. rate 16, weight 147 lb (66.679 kg)., Body mass index is 22.36 kg/(m^2). General: Thin WM. Appears in no acute distress. HEENT: Eyes: Normal Bilaterally. Ears: Ear Canals, TMs normal Bilaterally.  Mouth: Oral mucosa normal. He has upper dentures in place. On the bottom: He has no teeth and no dentures. Says those dentures have been broken a long time--doesn't have money to replace them. Says he has to eat soft foods. Neck: Bilateral Carotid Bruits.  Lungs: Crackles at bases bilaterally. O/w clear.  Heart: RRR with S1 S2. I/VI murmur at 2nd ICS on Right Musculoskeletal:  Strength and tone normal for age. Extremities/Skin: Warm and dry.  No edema.  Forearms with multiple scattered areas of deep reddish-purplish purpura.  Neuro: Alert and oriented X 3. Moves all extremities spontaneously. Gait is normal. CNII-XII grossly in tact. Psych:  Responds to questions appropriately with a normal affect. Diabetic Foot Exam: Inspection: Normal. No wounds, no callouses, worisome areas. Sensation intact. 1+-2+ DP pulses bilaterally. Trace PT pulses bilaterally.      ASSESSMENT AND PLAN:  74 y.o. year  old male with    1. Medicare annual wellness visit, subsequent - CBC with Differential/Platelet - COMPLETE METABOLIC PANEL WITH GFR - Lipid panel - TSH - Vit D  25 hydroxy (rtn osteoporosis monitoring)  2. Visit for preventive health examination - CBC with Differential/Platelet - COMPLETE METABOLIC PANEL WITH GFR - Lipid panel - TSH - Vit D  25 hydroxy (rtn osteoporosis monitoring)  1. S/P AVR (aortic valve replacement)  2. Cardiomyopathy, dilated, nonischemic  3. Carotid artery stenosis, bilateral  In the past, cardiology was ordering his carotid artery ultrasounds. However, patient had stopped following up with cardiology as he could not afford to pay the co-pay. Because of this financial situation, I  Ordered Echo and carotid artery Dopplers more recently.  And I ordered another set of carotid Dopplers which was performed on 10/01/14. Stable. 60-79% bilateral ICA stenosis. Follow-up one year.  4. Bilateral carotid bruits - see #3 above    5. Type 2 diabetes mellitus with hyperglycemia - Hemoglobin A1c  Last microalbumin: 02/2014, 04/04/2015   He provides excellent foot care. Reminded him of proper foot care and wearing shoes at all times. Diabetic Foot Exam Documented in Quality Metrics 04/04/2015   He is overdue for ophthalmology exam. He states that he cannot afford this. He has a high co-pay.   He is on ASA 81mg , on ACE inhibitor, and on statin therapy.  He has been off Metformin since hospitalization--see HPI. At 04/04/2015 OV---Told him that since BS has been so well controlled, can change to Routine OVs Q 6 months, instead of every 3 months.  6. Hyperlipidemia Last FLP and LFTs were 09/02/14. That time LDL was up just a little bit and patient did state that he had been out of his pravastatin recently but was picking up refill. Today patient states that he has restarted the pravastatin and is taking it as directed. 04/04/15--Fasting--Recheck  7. Essential  hypertension Blood pressure is at goal. Continue current  medications. On ACE inhibitor and beta blocker. Check lab to monitor.  8. Weight Loss Has had evaluation by myself as well as Dr. Wonda Amis. Also, at [place] 04/04/2015---Realized that he has no lower teeth and that those dentures have been broken for long time and he cannot afford new ones--he says he has to eat soft foods.  Weight is stable.  8. CONSTIPATION  In past was treating with Linzess, Mirilax Not a current problem.   9. 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 Also he is on aspirin 81 mg daily and this is also contributing to this.   9. BENIGN PROSTATIC HYPERTROPHY, WITH OBSTRUCTION  A screening PSA was done 09/25/12 and was normal. He is now 74 years old so can stop doing screening PSA.   10. Screening colonoscopy: 06/03/2009. Repeat 10-15 years. Had Colonoscopy --Dr. Rehman--11/26/13--Polyps. Internal and External Hemorrhoids.  11. Immunizations:  Pneumonia vaccine: Patient reports that he had this at age 48 his prior PCP  .--He would have received  Pneumovax 23.   Prevnar  13 given here 09/02/14.  Tetanus vaccine: Patient recently checked with his insurance and is going to cost him $40 and he deferred.  Zostavax: Patient check with his insurance. Too expensive. The patient defers.  Influenza vaccine: Received this 04/20/14   Subjective:   Patient presents for Medicare Annual/Subsequent preventive examination.   Review Past Medical/Family/Social: These are all reviewed, updated today   Risk Factors  Current exercise habits:  [place] the dog around the block once a day Dietary issues discussed: Compliant with diabetic, low cholesterol diet  Cardiac risk factors: HTN, HLD, DM, Male, Age, Carotid Stenosis  Depression Screen  (Note: if answer to either of the following is "Yes", a more complete depression screening is indicated)  Over the past two weeks,  have you felt down, depressed or hopeless? No Over the past two weeks, have you felt little interest or pleasure in doing things? No Have you lost interest or pleasure in daily life? No Do you often feel hopeless? No Do you cry easily over simple problems? No   Activities of Daily Living  In your present state of health, do you have any difficulty performing the following activities?:  Driving? No  Managing money? No  Feeding yourself? No  Getting from bed to chair? No  Climbing a flight of stairs? No  Preparing food and eating?: No  Bathing or showering? No  Getting dressed: No  Getting to the toilet? No  Using the toilet:No  Moving around from place to place: No  In the past year have you fallen or had a near fall?:No  Are you sexually active? No  Do you have more than one partner? No   Hearing Difficulties: No  Do you often ask people to speak up or repeat themselves? No  Do you experience ringing or noises in your ears? No Do you have difficulty understanding soft or whispered voices? No  Do you feel that you have a problem with memory? No Do you often misplace items? No  Do you feel safe at home? Yes  Cognitive Testing  Alert? Yes Normal Appearance?Yes  Oriented to person? Yes Place? Yes  Time? Yes  Recall of three objects? Yes  Can perform simple calculations? Yes  Displays appropriate judgment?Yes  Can read the correct time from a watch face?Yes   List the Names of Other Physician/Practitioners you currently use:  Dr. Caren Griffins [facility]  Indicate any recent Medical Services you may have received from other than [facility] providers in the past year (date may be approximate).  None  Screening Tests / Date-----This information is all documented above Colonoscopy                     Zostavax  Mammogram  Influenza Vaccine  Tetanus/tdap    Assessment:    Annual wellness medicare exam   Plan:    During the course of the visit the patient was educated  and counseled about appropriate screening and preventive services including:  Screening mammography  Colorectal cancer screening  Shingles vaccine. Prescription given to that she can get the vaccine at the pharmacy or Medicare part D.  Screen + for depression. PHQ- 9 score of 12 (moderate depression). We discussed the options of counseling versus possibly a medication. I encouraged her strongly think about the counseling. She is going through some medical problems currently and her husband is as well Mrs. been very stressful for her. She says she will think about it. She does have Xanax to use as needed. Though she may benefit from an SSRI for her more depressive type symptoms but she wants to hold off at this time.  I aksed her to please have her cardioloist send records since we have none on file.  Diet review for nutrition referral? Yes ____ Not Indicated __x__  Patient Instructions (the written plan) was given to the patient.  Medicare Attestation  I have personally reviewed:  The patient's medical and social history  Their use of alcohol, tobacco or illicit drugs  Their current medications and supplements  The patient's functional ability including ADLs,fall risks, home safety risks, cognitive, and hearing and visual impairment  Diet and physical activities  Evidence for depression or mood disorders  The patient's weight, height, BMI, and visual acuity have been recorded in the chart. I have made referrals, counseling, and provided education to the patient based on review of the above and I have provided the patient with a written personalized care plan for preventive services.         F/U 6 months, sooner if needed.   Signed,     Signed, [address] [place], [place], [facility] 04/04/2015 11:22 AM

## 2015-04-05 LAB — VITAMIN D 25 HYDROXY (VIT D DEFICIENCY, FRACTURES): Vit D, 25-Hydroxy: 39 ng/mL (ref 30–100)

## 2015-04-05 LAB — MICROALBUMIN, URINE: Microalb, Ur: 1.9 mg/dL (ref <2.0)

## 2015-04-07 LAB — T4, FREE: Free T4: 0.91 ng/dL (ref 0.80–1.80)

## 2015-04-08 ENCOUNTER — Encounter: Payer: Self-pay | Admitting: Family Medicine

## 2015-04-08 DIAGNOSIS — E038 Other specified hypothyroidism: Secondary | ICD-10-CM | POA: Insufficient documentation

## 2015-04-08 DIAGNOSIS — E039 Hypothyroidism, unspecified: Secondary | ICD-10-CM | POA: Insufficient documentation

## 2015-04-12 ENCOUNTER — Encounter: Payer: Self-pay | Admitting: Family Medicine

## 2015-04-12 DIAGNOSIS — M8580 Other specified disorders of bone density and structure, unspecified site: Secondary | ICD-10-CM | POA: Insufficient documentation

## 2015-06-10 ENCOUNTER — Encounter: Payer: Self-pay | Admitting: Cardiovascular Disease

## 2015-06-10 ENCOUNTER — Ambulatory Visit (INDEPENDENT_AMBULATORY_CARE_PROVIDER_SITE_OTHER): Payer: Medicare Other | Admitting: Adult Health

## 2015-06-10 VITALS — BP 130/78 | HR 95 | Ht 68.0 in | Wt 151.0 lb

## 2015-06-10 DIAGNOSIS — E7800 Pure hypercholesterolemia, unspecified: Secondary | ICD-10-CM | POA: Diagnosis not present

## 2015-06-10 DIAGNOSIS — I10 Essential (primary) hypertension: Secondary | ICD-10-CM | POA: Diagnosis not present

## 2015-06-10 MED ORDER — METOPROLOL SUCCINATE ER 50 MG PO TB24: 50.0000 mg | ORAL_TABLET | Freq: Every day | ORAL | Status: DC

## 2015-06-10 NOTE — Progress Notes (Deleted)
Name: PAULA ZIETZ    DOB: 06/14/1941  Age: 74 y.o.  MR#: [id number]       PCP:  Karis Juba, PA-C      Insurance: Payor: [employer] MEDICARE / Plan: UHC MEDICARE / Product Type: *No Product type* /   CC:   No chief complaint on file.   VS Filed Vitals:   06/10/15 1140  BP: 130/78  Pulse: 95  Height: 5\' 8"  (1.727 m)  Weight: 151 lb (68.493 kg)    Weights Current Weight  06/10/15 151 lb (68.493 kg)  04/04/15 147 lb (66.679 kg)  12/29/14 146 lb (66.225 kg)    Blood Pressure  BP Readings from Last 3 Encounters:  06/10/15 130/78  04/04/15 100/78  12/29/14 106/66     Admit date:  (Not on file) Last encounter with RMR:  Visit date not found   Allergy Actos; Morphine; and Statins  Current Outpatient Prescriptions  Medication Sig Dispense Refill  . aspirin 81 MG EC tablet Take 1 tablet (81 mg total) by mouth daily. Swallow whole. 30 tablet 12  . Cholecalciferol (VITAMIN D) 2000 UNITS tablet Take 2,000 Units by mouth daily.    . fish oil-omega-3 fatty acids 1000 MG capsule Take 1 g by mouth daily.     . fluticasone (FLONASE) 50 MCG/ACT nasal spray Place 2 sprays into both nostrils daily. 16 g 6  . lisinopril (PRINIVIL,ZESTRIL) 2.5 MG tablet TAKE 1 TABLET (2.5 MG TOTAL) BY MOUTH DAILY. (Patient taking differently: Take 2.5 mg by mouth daily. TAKE 1 TABLET (2.5 MG TOTAL) BY MOUTH DAILY.) 90 tablet 3  . metoprolol (LOPRESSOR) 50 MG tablet TAKE 1 TABLET (50 MG TOTAL) BY MOUTH 2 (TWO) TIMES DAILY. (Patient taking differently: Take 50 mg by mouth daily. TAKE 1 TABLET (50 MG TOTAL) BY MOUTH 2 (TWO) TIMES DAILY.) 180 tablet 3  . pravastatin (PRAVACHOL) 40 MG tablet Take 1 tablet (40 mg total) by mouth daily. 90 tablet 3   No current facility-administered medications for this visit.    Discontinued Meds:   There are no discontinued medications.  Patient Active Problem List   Diagnosis Date Noted  . Osteopenia determined by x-ray 04/12/2015  . Subclinical hypothyroidism  04/08/2015  . Purpura senilis ([place]) 12/29/2014  . Carotid artery stenosis 09/02/2014  . Cardiomyopathy, dilated, nonischemic ([place]) 04/18/2014  . Postoperative atrial fibrillation ([place]) 04/18/2014  . 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  . Aortic stenosis 04/08/2014  . Chronic systolic heart failure ([place]) 04/08/2014  . Unstable angina ([place]) 04/08/2014  . Chest pain 04/08/2014  . Personal history of colonic polyps 11/26/2013  . Loss of weight 11/02/2013  . Aortic valve disorder 07/11/2010  . LBBB 07/11/2010  . Bilateral carotid bruits 07/11/2010  . NEOPLASM OF UNCERTAIN BEHAVIOR OF SKIN 09/27/2008  . BENIGN PROSTATIC HYPERTROPHY, WITH OBSTRUCTION 09/27/2008  . KNEE PAIN 09/27/2008  . FLATULENCE ERUCTATION AND GAS PAIN 04/06/2008  . ALLERGIC RHINITIS 01/09/2008  . Constipation 10/17/2007  . HEADACHE 07/25/2007  . LEG CRAMPS 02/13/2007  . DYSPNEA ON EXERTION 01/10/2007  . Diabetes ([place]) 10/10/2006  . Hyperlipidemia 10/10/2006  . Essential hypertension 10/10/2006  . GERD 10/10/2006  . CARDIAC MURMUR 10/10/2006  . CHEST PAIN, EXERTIONAL 10/10/2006    LABS    Component Value Date/Time   NA 138 04/04/2015 1134   NA 138 09/02/2014 1000   NA 134* 04/27/2014 1133   K 4.2 04/04/2015 1134   K 4.2 09/02/2014 1000   K 4.4 04/27/2014 1133  CL 101 04/04/2015 1134   CL 100 09/02/2014 1000   CL 98 04/27/2014 1133   CO2 29 04/04/2015 1134   CO2 24 09/02/2014 1000   CO2 26 04/27/2014 1133   GLUCOSE 126* 04/04/2015 1134   GLUCOSE 113* 09/02/2014 1000   GLUCOSE 133* 04/27/2014 1133   BUN 12 04/04/2015 1134   BUN 11 09/02/2014 1000   BUN 10 04/27/2014 1133   CREATININE 0.77 04/04/2015 1134   CREATININE 0.73 09/02/2014 1000   CREATININE 0.79 04/27/2014 1133   CREATININE 0.71 04/17/2014 1856   CREATININE 0.75 04/17/2014 0720   CREATININE 0.61 04/16/2014 0400   CALCIUM 9.5 04/04/2015 1134   CALCIUM 9.4 09/02/2014 1000   CALCIUM 9.6 04/27/2014 1133   GFRNONAA 89 04/04/2015  1134   GFRNONAA >89 09/02/2014 1000   GFRNONAA 88 04/27/2014 1133   GFRNONAA 90* 04/17/2014 1856   GFRNONAA 88* 04/17/2014 0720   GFRNONAA >90 04/16/2014 0400   GFRAA >89 04/04/2015 1134   GFRAA >89 09/02/2014 1000   GFRAA >89 04/27/2014 1133   GFRAA >90 04/17/2014 1856   GFRAA >90 04/17/2014 0720   GFRAA >90 04/16/2014 0400   CMP     Component Value Date/Time   NA 138 04/04/2015 1134   K 4.2 04/04/2015 1134   CL 101 04/04/2015 1134   CO2 29 04/04/2015 1134   GLUCOSE 126* 04/04/2015 1134   BUN 12 04/04/2015 1134   CREATININE 0.77 04/04/2015 1134   CREATININE 0.71 04/17/2014 1856   CALCIUM 9.5 04/04/2015 1134   PROT 6.6 04/04/2015 1134   ALBUMIN 4.1 04/04/2015 1134   AST 18 04/04/2015 1134   ALT 18 04/04/2015 1134   ALKPHOS 74 04/04/2015 1134   BILITOT 0.9 04/04/2015 1134   GFRNONAA 89 04/04/2015 1134   GFRNONAA 90* 04/17/2014 1856   GFRAA >89 04/04/2015 1134   GFRAA >90 04/17/2014 1856       Component Value Date/Time   WBC 6.4 04/04/2015 1134   WBC 4.1 04/17/2014 0720   WBC 4.7 04/16/2014 0400   HGB 15.0 04/04/2015 1134   HGB 9.6* 04/17/2014 0720   HGB 9.1* 04/16/2014 0400   HCT 45.0 04/04/2015 1134   HCT 28.1* 04/17/2014 0720   HCT 27.0* 04/16/2014 0400   MCV 92.0 04/04/2015 1134   MCV 91.8 04/17/2014 0720   MCV 90.0 04/16/2014 0400    Lipid Panel     Component Value Date/Time   CHOL 178 04/04/2015 1134   TRIG 185* 04/04/2015 1134   HDL 35* 04/04/2015 1134   CHOLHDL 5.1* 04/04/2015 1134   VLDL 37* 04/04/2015 1134   LDLCALC 106 04/04/2015 1134    ABG    Component Value Date/Time   PHART 7.363 04/14/2014 0145   PCO2ART 38.7 04/14/2014 0145   PO2ART 121.0* 04/14/2014 0145   HCO3 21.9 04/14/2014 0145   TCO2 22 04/14/2014 1737   ACIDBASEDEF 3.0* 04/14/2014 0145   O2SAT 98.0 04/14/2014 0145     Lab Results  Component Value Date   TSH 6.128* 04/04/2015   BNP (last 3 results) No results for input(s): BNP in the last 8760 hours.  ProBNP (last  3 results) No results for input(s): PROBNP in the last 8760 hours.  Cardiac Panel (last 3 results) No results for input(s): CKTOTAL, CKMB, TROPONINI, RELINDX in the last 72 hours.  Iron/TIBC/Ferritin/ %Sat No results found for: IRON, TIBC, FERRITIN, IRONPCTSAT   EKG Orders placed or performed in visit on 06/10/15  . EKG 12-Lead     Prior Assessment  and Plan Problem List as of 06/10/2015      Cardiovascular and Mediastinum   Essential hypertension   Last Assessment & Plan 05/25/2014 Office Visit Written 05/25/2014  4:26 PM by Lendon Colonel, NP    Blood pressure is low normal. Currently. I will not make any changes in his medication regimen at this time.      Aortic valve disorder   Last Assessment & Plan 05/25/2014 Office Visit Written 05/25/2014  4:24 PM by Lendon Colonel, NP    Is doing well post aortic valve replacement. Sternotomy scar is well-healed without evidence of infection, bleeding, or erythema. The patient denies any severe pain. Although he is having still some soreness. He is not yet regained any energy, and he is having trouble keeping his appetite up. He states his breathing status is better, but he is having a congested cough. I will do a PA and lateral chest x-ray to evaluate for abnormalities. We will see the patient back in the office in 3 months unless he is symptomatic.      LBBB   Aortic stenosis   Chronic systolic heart failure [facility])   Last Assessment & Plan 05/25/2014 Office Visit Written 05/25/2014  4:25 PM by Lendon Colonel, NP    No overt evidence of fluid retention. I am hearing some mild crackles in the lungs, which may be related to bronchitis versus mild CHF. I do not see any lower extremity edema, his O2 sat is 99%. He was taken off of benazepril HCTZ, and metoprolol 25 mg prior to discharge. He remains on low-dose lisinopril, Lasix, 40 mg. We will need to repeat his echocardiogram in approximately 3-6 months to evaluate for changes in LV  function.      Unstable angina (HCC)   Cardiomyopathy, dilated, nonischemic (HCC)   Postoperative atrial fibrillation (HCC)   Carotid artery stenosis   Purpura senilis (HCC)     Respiratory   ALLERGIC RHINITIS     Digestive   GERD   Constipation     Endocrine   Diabetes ([place])   Subclinical hypothyroidism     Musculoskeletal and Integument   NEOPLASM OF UNCERTAIN BEHAVIOR OF SKIN   Osteopenia determined by x-ray     Genitourinary   BENIGN PROSTATIC HYPERTROPHY, WITH OBSTRUCTION     Other   Hyperlipidemia   Last Assessment & Plan 10/22/2012 Office Visit Written 10/22/2012 11:40 AM by Lendon Colonel, NP    He has had recent lipid studies completed on 10/07/2012. Total cholesterol 149 HDL 32 triglycerides 204 LDL 76. I provided him with a copy of this for his own records. LFTs were found to be within normal limits. He offers no myalgia type symptoms. Continue him on current medication regimen without changes.      KNEE PAIN   LEG CRAMPS   HEADACHE   CARDIAC MURMUR   Bilateral carotid bruits   DYSPNEA ON EXERTION   Last Assessment & Plan 05/25/2014 Office Visit Written 05/25/2014  4:26 PM by Lendon Colonel, NP    This symptom has improved. He is now at [place] class I -ll. He is not extremely active. He is trying to get his energy back.      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 He is willing to walk on treadmill. Will follow up with him in 2-3 weeks to discuss test results. With LBBB, EKG changes will be difficult to evaluate.       FLATULENCE ERUCTATION AND GAS PAIN   Loss of weight   Personal history of colonic polyps   Chest pain   S/P  AVR (aortic valve replacement)       Imaging: No results found.   Name: RANNY WIEBELHAUS    DOB: 06-01-1941  Age: 74 y.o.  MR#: [id number]       PCP:  Karis Juba, PA-C      Insurance: Payor: [employer] MEDICARE / Plan: UHC MEDICARE / Product Type: *No Product type* /   CC:   No chief complaint on file.   VS Filed Vitals:   06/10/15 1140  BP: 130/78  Pulse: 95  Height: 5\' 8"  (1.727 m)  Weight: 151 lb (68.493 kg)    Weights Current Weight  06/10/15 151 lb (68.493 kg)  04/04/15 147 lb (66.679 kg)  12/29/14 146 lb (66.225 kg)    Blood Pressure  BP Readings from Last 3 Encounters:  06/10/15 130/78  04/04/15 100/78  12/29/14 106/66     Admit date:  (Not on file) Last encounter with RMR:  Visit date not found   Allergy Actos; Morphine; and Statins  Current Outpatient Prescriptions  Medication Sig Dispense Refill  . aspirin 81 MG EC tablet Take 1 tablet (81 mg total) by mouth daily. Swallow whole. 30 tablet 12  . Cholecalciferol (VITAMIN D) 2000 UNITS tablet Take 2,000 Units by mouth daily.    . fish oil-omega-3 fatty acids 1000 MG capsule Take 1 g by mouth daily.     . fluticasone (FLONASE) 50 MCG/ACT nasal spray Place 2 sprays into both nostrils daily. 16 g 6  . lisinopril (PRINIVIL,ZESTRIL) 2.5 MG tablet TAKE 1 TABLET (2.5 MG TOTAL) BY MOUTH DAILY. (Patient taking differently: Take 2.5 mg by mouth daily. TAKE 1 TABLET (2.5 MG TOTAL) BY MOUTH DAILY.) 90 tablet 3  . metoprolol (LOPRESSOR) 50 MG tablet TAKE 1 TABLET (50 MG TOTAL) BY MOUTH 2 (TWO) TIMES DAILY. (Patient taking differently: Take 50 mg by mouth daily. TAKE 1 TABLET (50 MG TOTAL) BY MOUTH 2 (TWO) TIMES DAILY.) 180 tablet 3  . pravastatin (PRAVACHOL) 40 MG tablet Take 1 tablet (40 mg total) by mouth daily. 90 tablet 3   No current facility-administered medications for this visit.    Discontinued Meds:   There are no discontinued medications.  Patient Active Problem List   Diagnosis Date Noted  .  Osteopenia determined by x-ray 04/12/2015  . 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  . Purpura senilis ([place]) 12/29/2014  . Carotid artery stenosis 09/02/2014  . Cardiomyopathy, dilated, nonischemic ([place]) 04/18/2014  . Postoperative atrial fibrillation ([place]) 04/18/2014  . 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  . Aortic stenosis 04/08/2014  . Chronic systolic heart failure ([place]) 04/08/2014  . Unstable angina ([place]) 04/08/2014  . Chest pain 04/08/2014  . Personal history of colonic polyps 11/26/2013  . Loss of weight 11/02/2013  . Aortic valve disorder 07/11/2010  . LBBB 07/11/2010  . Bilateral carotid bruits 07/11/2010  . NEOPLASM OF UNCERTAIN BEHAVIOR OF SKIN 09/27/2008  . BENIGN PROSTATIC HYPERTROPHY, WITH OBSTRUCTION 09/27/2008  . KNEE PAIN 09/27/2008  . FLATULENCE ERUCTATION AND GAS PAIN 04/06/2008  . ALLERGIC RHINITIS 01/09/2008  . Constipation 10/17/2007  . HEADACHE  07/25/2007  . LEG CRAMPS 02/13/2007  . DYSPNEA ON EXERTION 01/10/2007  . Diabetes ([place]) 10/10/2006  . Hyperlipidemia 10/10/2006  . Essential hypertension 10/10/2006  . GERD 10/10/2006  . CARDIAC MURMUR 10/10/2006  . CHEST PAIN, EXERTIONAL 10/10/2006    LABS    Component Value Date/Time   NA 138 04/04/2015 1134   NA 138 09/02/2014 1000   NA 134* 04/27/2014 1133   K 4.2 04/04/2015 1134   K 4.2 09/02/2014 1000   K 4.4 04/27/2014 1133   CL 101 04/04/2015 1134   CL 100 09/02/2014 1000   CL 98 04/27/2014 1133   CO2 29 04/04/2015 1134   CO2 24 09/02/2014 1000   CO2 26 04/27/2014 1133   GLUCOSE 126* 04/04/2015 1134   GLUCOSE 113* 09/02/2014 1000   GLUCOSE 133* 04/27/2014 1133   BUN 12 04/04/2015 1134   BUN 11 09/02/2014 1000   BUN 10 04/27/2014 1133   CREATININE 0.77 04/04/2015 1134   CREATININE 0.73 09/02/2014 1000   CREATININE 0.79 04/27/2014 1133   CREATININE 0.71 04/17/2014 1856   CREATININE 0.75 04/17/2014 0720   CREATININE 0.61 04/16/2014 0400   CALCIUM 9.5 04/04/2015 1134   CALCIUM 9.4  09/02/2014 1000   CALCIUM 9.6 04/27/2014 1133   GFRNONAA 89 04/04/2015 1134   GFRNONAA >89 09/02/2014 1000   GFRNONAA 88 04/27/2014 1133   GFRNONAA 90* 04/17/2014 1856   GFRNONAA 88* 04/17/2014 0720   GFRNONAA >90 04/16/2014 0400   GFRAA >89 04/04/2015 1134   GFRAA >89 09/02/2014 1000   GFRAA >89 04/27/2014 1133   GFRAA >90 04/17/2014 1856   GFRAA >90 04/17/2014 0720   GFRAA >90 04/16/2014 0400   CMP     Component Value Date/Time   NA 138 04/04/2015 1134   K 4.2 04/04/2015 1134   CL 101 04/04/2015 1134   CO2 29 04/04/2015 1134   GLUCOSE 126* 04/04/2015 1134   BUN 12 04/04/2015 1134   CREATININE 0.77 04/04/2015 1134   CREATININE 0.71 04/17/2014 1856   CALCIUM 9.5 04/04/2015 1134   PROT 6.6 04/04/2015 1134   ALBUMIN 4.1 04/04/2015 1134   AST 18 04/04/2015 1134   ALT 18 04/04/2015 1134   ALKPHOS 74 04/04/2015 1134   BILITOT 0.9 04/04/2015 1134   GFRNONAA 89 04/04/2015 1134   GFRNONAA 90* 04/17/2014 1856   GFRAA >89 04/04/2015 1134   GFRAA >90 04/17/2014 1856       Component Value Date/Time   WBC 6.4 04/04/2015 1134   WBC 4.1 04/17/2014 0720   WBC 4.7 04/16/2014 0400   HGB 15.0 04/04/2015 1134   HGB 9.6* 04/17/2014 0720   HGB 9.1* 04/16/2014 0400   HCT 45.0 04/04/2015 1134   HCT 28.1* 04/17/2014 0720   HCT 27.0* 04/16/2014 0400   MCV 92.0 04/04/2015 1134   MCV 91.8 04/17/2014 0720   MCV 90.0 04/16/2014 0400    Lipid Panel     Component Value Date/Time   CHOL 178 04/04/2015 1134   TRIG 185* 04/04/2015 1134   HDL 35* 04/04/2015 1134   CHOLHDL 5.1* 04/04/2015 1134   VLDL 37* 04/04/2015 1134   LDLCALC 106 04/04/2015 1134    ABG    Component Value Date/Time   PHART 7.363 04/14/2014 0145   PCO2ART 38.7 04/14/2014 0145   PO2ART 121.0* 04/14/2014 0145   HCO3 21.9 04/14/2014 0145   TCO2 22 04/14/2014 1737   ACIDBASEDEF 3.0* 04/14/2014 0145   O2SAT 98.0 04/14/2014 0145     Lab Results  Component Value  Date   TSH 6.128* 04/04/2015   BNP (last 3  results) No results for input(s): BNP in the last 8760 hours.  ProBNP (last 3 results) No results for input(s): PROBNP in the last 8760 hours.  Cardiac Panel (last 3 results) No results for input(s): CKTOTAL, CKMB, TROPONINI, RELINDX in the last 72 hours.  Iron/TIBC/Ferritin/ %Sat No results found for: IRON, TIBC, FERRITIN, IRONPCTSAT   EKG Orders placed or performed in visit on 06/10/15  . EKG 12-Lead     Prior Assessment and Plan Problem List as of 06/10/2015      Cardiovascular and Mediastinum   Essential hypertension   Last Assessment & Plan 05/25/2014 Office Visit Written 05/25/2014  4:26 PM by Lendon Colonel, NP    Blood pressure is low normal. Currently. I will not make any changes in his medication regimen at this time.      Aortic valve disorder   Last Assessment & Plan 05/25/2014 Office Visit Written 05/25/2014  4:24 PM by Lendon Colonel, NP    Is doing well post aortic valve replacement. Sternotomy scar is well-healed without evidence of infection, bleeding, or erythema. The patient denies any severe pain. Although he is having still some soreness. He is not yet regained any energy, and he is having trouble keeping his appetite up. He states his breathing status is better, but he is having a congested cough. I will do a PA and lateral chest x-ray to evaluate for abnormalities. We will see the patient back in the office in 3 months unless he is symptomatic.      LBBB   Aortic stenosis   Chronic systolic heart failure [facility])   Last Assessment & Plan 05/25/2014 Office Visit Written 05/25/2014  4:25 PM by Lendon Colonel, NP    No overt evidence of fluid retention. I am hearing some mild crackles in the lungs, which may be related to bronchitis versus mild CHF. I do not see any lower extremity edema, his O2 sat is 99%. He was taken off of benazepril HCTZ, and metoprolol 25 mg prior to discharge. He remains on low-dose lisinopril, Lasix, 40 mg. We will need to repeat  his echocardiogram in approximately 3-6 months to evaluate for changes in LV function.      Unstable angina (HCC)   Cardiomyopathy, dilated, nonischemic (HCC)   Postoperative atrial fibrillation (HCC)   Carotid artery stenosis   Purpura senilis (HCC)     Respiratory   ALLERGIC RHINITIS     Digestive   GERD   Constipation     Endocrine   Diabetes ([place])   Subclinical hypothyroidism     Musculoskeletal and Integument   NEOPLASM OF UNCERTAIN BEHAVIOR OF SKIN   Osteopenia determined by x-ray     Genitourinary   BENIGN PROSTATIC HYPERTROPHY, WITH OBSTRUCTION     Other   Hyperlipidemia   Last Assessment & Plan 10/22/2012 Office Visit Written 10/22/2012 11:40 AM by Lendon Colonel, NP    He has had recent lipid studies completed on 10/07/2012. Total cholesterol 149 HDL 32 triglycerides 204 LDL 76. I provided him with a copy of this for his own records. LFTs were found to be within normal limits. He offers no myalgia type symptoms. Continue him on current medication regimen without changes.      KNEE PAIN   LEG CRAMPS   HEADACHE   CARDIAC MURMUR   Bilateral carotid bruits   DYSPNEA ON EXERTION   Last Assessment & Plan 05/25/2014 Office Visit  Written 05/25/2014  4:26 PM by Lendon Colonel, NP    This symptom has improved. He is now at [place] class I -ll. He is not extremely active. He is trying to get his energy back.      CHEST PAIN, EXERTIONAL   Last Assessment & Plan 09/25/2012 Office Visit Written 09/25/2012  1:24 PM by Lendon Colonel, NP    He had cardiac cath in 2008 demonstrating non-obstructive CAD at that time.With continued CVRF to include diabetes, insulin dependent, and recurrent symptoms, will have stress myoview to evaluate for progression of CAD. He is willing to walk on treadmill. Will follow up with him in 2-3 weeks to discuss test results. With LBBB, EKG changes will be difficult to evaluate.       FLATULENCE ERUCTATION AND GAS PAIN    Loss of weight   Personal history of colonic polyps   Chest pain   S/P AVR (aortic valve replacement)       Imaging: No results found.

## 2015-06-10 NOTE — Patient Instructions (Signed)
Medication Instructions:  START METOPROLOL XL 50 mg daily  Labwork: none  Testing/Procedures: none  Follow-Up: Your physician wants you to follow-up in: 6  Months with Dr. Harl Bowie. You will receive a reminder letter in the mail two months in advance. If you don't receive a letter, please call our office to schedule the follow-up appointment.    Any Other Special Instructions Will Be Listed Below (If Applicable).    If you need a refill on your cardiac medications before your next appointment, please call your pharmacy.  Thanks for choosing [facility]!!!
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
## 2015-06-14 ENCOUNTER — Telehealth: Payer: Self-pay

## 2015-06-14 NOTE — Telephone Encounter (Signed)
Pharmacy faxed asking to clarify if patient is on Lopressor 50 mg BID or Toprol XL 50 mg QD or both.  I found where you prescribed the Toprol at the patient's last visit, but I did not find if the Lopressor was dc'd.  Please advise.

## 2015-06-16 ENCOUNTER — Other Ambulatory Visit: Payer: Self-pay

## 2015-06-16 MED ORDER — METOPROLOL SUCCINATE ER 50 MG PO TB24: 50.0000 mg | ORAL_TABLET | Freq: Every day | ORAL | Status: DC

## 2015-06-16 NOTE — Telephone Encounter (Signed)
will forward back to [facility]

## 2015-06-16 NOTE — Telephone Encounter (Signed)
He has just filled the BID lopressor and wanted to finish it first. When refill time comes he is switching to Metoprolol XL. He sometimes forgets take the second dose, so I changed it to make it easier for him.

## 2015-07-27 ENCOUNTER — Ambulatory Visit (INDEPENDENT_AMBULATORY_CARE_PROVIDER_SITE_OTHER): Payer: Medicare Other | Admitting: Family Medicine

## 2015-07-27 DIAGNOSIS — Z23 Encounter for immunization: Secondary | ICD-10-CM

## 2015-08-10 ENCOUNTER — Other Ambulatory Visit: Payer: Self-pay | Admitting: Adult Health

## 2015-09-20 ENCOUNTER — Other Ambulatory Visit: Payer: Self-pay | Admitting: Physician Assistant

## 2015-09-20 DIAGNOSIS — I6523 Occlusion and stenosis of bilateral carotid arteries: Secondary | ICD-10-CM

## 2015-10-03 ENCOUNTER — Ambulatory Visit (HOSPITAL_COMMUNITY)
Admission: RE | Admit: 2015-10-03 | Discharge: 2015-10-03 | Disposition: A | Discharge status: Home / Self Care | Payer: Medicare Other | Source: Ambulatory Visit | Attending: Physician Assistant | Admitting: Physician Assistant

## 2015-10-03 DIAGNOSIS — E785 Hyperlipidemia, unspecified: Secondary | ICD-10-CM | POA: Diagnosis not present

## 2015-10-03 DIAGNOSIS — I10 Essential (primary) hypertension: Secondary | ICD-10-CM | POA: Diagnosis not present

## 2015-10-03 DIAGNOSIS — E119 Type 2 diabetes mellitus without complications: Secondary | ICD-10-CM | POA: Insufficient documentation

## 2015-10-03 DIAGNOSIS — I6523 Occlusion and stenosis of bilateral carotid arteries: Secondary | ICD-10-CM | POA: Diagnosis not present

## 2015-10-05 ENCOUNTER — Encounter: Payer: Self-pay | Admitting: Physician Assistant

## 2015-10-05 ENCOUNTER — Ambulatory Visit (INDEPENDENT_AMBULATORY_CARE_PROVIDER_SITE_OTHER): Payer: Medicare Other | Admitting: Physician Assistant

## 2015-10-05 ENCOUNTER — Other Ambulatory Visit: Payer: Self-pay | Admitting: Physician Assistant

## 2015-10-05 ENCOUNTER — Ambulatory Visit
Admission: RE | Admit: 2015-10-05 | Discharge: 2015-10-05 | Disposition: A | Discharge status: Home / Self Care | Payer: Medicare Other | Source: Ambulatory Visit | Attending: Physician Assistant | Admitting: Physician Assistant

## 2015-10-05 VITALS — BP 122/80 | HR 68 | Temp 97.5°F | Resp 18 | Wt 155.0 lb

## 2015-10-05 DIAGNOSIS — E785 Hyperlipidemia, unspecified: Secondary | ICD-10-CM

## 2015-10-05 DIAGNOSIS — R0989 Other specified symptoms and signs involving the circulatory and respiratory systems: Secondary | ICD-10-CM

## 2015-10-05 DIAGNOSIS — K5900 Constipation, unspecified: Secondary | ICD-10-CM | POA: Diagnosis not present

## 2015-10-05 DIAGNOSIS — I10 Essential (primary) hypertension: Secondary | ICD-10-CM | POA: Diagnosis not present

## 2015-10-05 DIAGNOSIS — I5022 Chronic systolic (congestive) heart failure: Secondary | ICD-10-CM | POA: Diagnosis not present

## 2015-10-05 DIAGNOSIS — D692 Other nonthrombocytopenic purpura: Secondary | ICD-10-CM

## 2015-10-05 DIAGNOSIS — E039 Hypothyroidism, unspecified: Secondary | ICD-10-CM

## 2015-10-05 DIAGNOSIS — M8580 Other specified disorders of bone density and structure, unspecified site: Secondary | ICD-10-CM

## 2015-10-05 DIAGNOSIS — M25552 Pain in left hip: Secondary | ICD-10-CM | POA: Diagnosis not present

## 2015-10-05 DIAGNOSIS — I429 Cardiomyopathy, unspecified: Secondary | ICD-10-CM | POA: Diagnosis not present

## 2015-10-05 DIAGNOSIS — Z954 Presence of other heart-valve replacement: Secondary | ICD-10-CM | POA: Diagnosis not present

## 2015-10-05 DIAGNOSIS — I6523 Occlusion and stenosis of bilateral carotid arteries: Secondary | ICD-10-CM

## 2015-10-05 DIAGNOSIS — E038 Other specified hypothyroidism: Secondary | ICD-10-CM | POA: Diagnosis not present

## 2015-10-05 DIAGNOSIS — I359 Nonrheumatic aortic valve disorder, unspecified: Secondary | ICD-10-CM

## 2015-10-05 DIAGNOSIS — E1165 Type 2 diabetes mellitus with hyperglycemia: Secondary | ICD-10-CM | POA: Diagnosis not present

## 2015-10-05 DIAGNOSIS — I420 Dilated cardiomyopathy: Secondary | ICD-10-CM

## 2015-10-05 DIAGNOSIS — Z952 Presence of prosthetic heart valve: Secondary | ICD-10-CM

## 2015-10-05 LAB — COMPLETE METABOLIC PANEL WITH GFR
ALT: 18 U/L (ref 9–46)
AST: 19 U/L (ref 10–35)
Albumin: 4.4 g/dL (ref 3.6–5.1)
Alkaline Phosphatase: 70 U/L (ref 40–115)
BUN: 13 mg/dL (ref 7–25)
CHLORIDE: 98 mmol/L (ref 98–110)
CO2: 27 mmol/L (ref 20–31)
CREATININE: 0.86 mg/dL (ref 0.70–1.18)
Calcium: 9.3 mg/dL (ref 8.6–10.3)
GFR, Est African American: >89 mL/min (ref >=60)
GFR, Est Non African American: 84 mL/min (ref >=60)
Glucose, Bld: 195 mg/dL — ABNORMAL HIGH (ref 70–99)
Potassium: 4.4 mmol/L (ref 3.5–5.3)
Sodium: 136 mmol/L (ref 135–146)
Total Bilirubin: 1.1 mg/dL (ref 0.2–1.2)
Total Protein: 7 g/dL (ref 6.1–8.1)

## 2015-10-05 LAB — LIPID PANEL
Cholesterol: 158 mg/dL (ref 125–200)
HDL: 36 mg/dL — ABNORMAL LOW (ref >=40)
LDL CALC: 79 mg/dL (ref <130)
Total CHOL/HDL Ratio: 4.4 Ratio (ref <=5.0)
Triglycerides: 217 mg/dL — ABNORMAL HIGH (ref <150)
VLDL: 43 mg/dL — AB (ref <30)

## 2015-10-05 LAB — TSH: TSH: 5.05 m[IU]/L — AB (ref 0.40–4.50)

## 2015-10-05 LAB — T4, FREE: FREE T4: 1 ng/dL (ref 0.8–1.8)

## 2015-10-05 LAB — HEMOGLOBIN A1C
Hgb A1c MFr Bld: 8.9 % — ABNORMAL HIGH (ref <5.7)
MEAN PLASMA GLUCOSE: 209 mg/dL — AB (ref <117)

## 2015-10-05 NOTE — Progress Notes (Signed)
Patient ID: [name] MRN: [id number], DOB: May 04, 1941, 75 y.o. Date of Encounter: @DATE @  Chief Complaint:  Chief Complaint  Patient presents with  . Follow-up    6 mos f/u, no changes    HPI: 75 y.o. year old white male  presents today for routine followup office visit.   On 04/13/2014 he had aortic valve replacement secondary to severe aortic stenosis.  Had Normal coronaries by cardiac catheterization. Preoperatively he had low EF.  At his office visit with me 05/26/14, pt stated that they did give him metformin in the hospital, but said that he had not been taking it since he got home because his blood sugars were low.  He stated that even off of Metformin, his fasting morning blood sugars had been around 138 and 99. At that visit I told him to stay off of metformin. I reviewed the fact that he had experienced further weight loss with the hospitalization and surgery--I'm sure this is contributing to his decreased sugar.  AT OV 12/2014: He states that he checks his blood sugar about once a week. Sometimes fasting or sometimes during the day.  Says this morning fasting reading was 130. Says that he usually gets around 100 to 130 for fasting readings.  States that he takes his dog for a walk every morning. They usually go around the block and then back around. Says that he feels pretty good with this exertion.  He is taking medications as directed with no adverse effects. No lightheadedness. No presyncope.  His last office visit with me prior to the hospitlaization was 02/22/2014. The following is copied from that office visit note.:  At his routine OV 09/28/2013--at that visit I noticed that he looked even thinner that day than usual. I then noticed that I had noted at his prior visit with me 06/29/13 that he had also lost weight at the time of that visit. At that visit , I initially assumed that his weight loss was intentional  secondary to dietary changes to help control  his diabetes.  However, at that visit 06/29/13 he reported that he decrease his dietary intake secondary to finances. At that visit he stated   "I just have to eat what I have.   I can't afford anything more."  Weight February 2014   was 167. Weight 06/29/13             was 155. Weight 09/28/13               was 147.  09/28/13---checked labs to evaluate underlying medical causes of weight loss including CBC TSH and CME T. These were all normal.  We also discussed the fact he had a screening colonoscopy 06/03/2009 and he was told to repeat 10-15 years.  He did come in for weight check and his weight was stable at that time. Say when he came in for that we checked our staff gave him some contact information --he could call for some assistance.  At [facility] 09/28/13 he says that he is using [employer] Says that they get him through to every other Thursday. He gives very pick this up. Says that he has other phone numbers that he could call for food if he needs to but hasn't needed to so far.  He had colonoscopy by Dr. Jearld Adjutant on 12/12/13. There were polyps which were biopsied. External and internal hemorrhoids. At that time Dr. Wonda Amis planned to order CT of abdomen and pelvis as well  as chest.  He had a CT scan on 12/09/13. No malignancy was seen on either CT scan. No significant finding seen on the CT scans.  Today he says that he continues to  get food once a month from 4 or 5 different places--- [employer] out reach, [employer], [employer], their church.  Weight 12/29/14 remains stable at 146.  At visit 12/29/14 I also reviewed last cardiology note.  Saw NP at Cardiology 11/22/14. Things were stable at that visit and he was to continue current medications. No labs or tests were indicated/ ordered at that time and they plan for follow-up 6 months.  04/04/2015:  He reports that sometimes when he first stands up, his left "hip" "is stiff--takes a minute to straighten up". Says there is no  pain, just stiffness.  Does not want to pursue imaging, further eval right now sec to finances--will monitor. No other complaints or concerns. Has been feeling pretty good. Still checks blood sugar once a week--fasting---gets 120s, 133, "something like that"  10/05/2015: He has no complaints during the visit. Reports he had his follow-up carotid artery ultrasound on Monday but has not heard results yet. He is taking cholesterol medication as directed. No myalgias or other adverse effects. He is taking blood pressure medications as directed with no adverse effects. When he gets up off of the exam table to go to the lab, he moves very slowly and stiff holding to his left hip. Asked what area was bothering him he points to the anterior aspect of the left hip.  Past Medical History  Diagnosis Date  . Hyperlipidemia   . Hypertension   . Vitamin D deficiency   . Diabetes mellitus   . GERD (gastroesophageal reflux disease)   . BPH (benign prostatic hypertrophy)   . History of cataract   . Constipation   . CAD (coronary artery disease)   . Aortic stenosis, mild   . Cancer (HCC)     spindle cell cancer of left ear  . Nephrolithiasis      Home Meds: Outpatient Prescriptions Prior to Visit  Medication Sig Dispense Refill  . aspirin 81 MG EC tablet Take 1 tablet (81 mg total) by mouth daily. Swallow whole. 30 tablet 12  . Cholecalciferol (VITAMIN D) 2000 UNITS tablet Take 2,000 Units by mouth daily.    . fish oil-omega-3 fatty acids 1000 MG capsule Take 1 g by mouth daily.     . fluticasone (FLONASE) 50 MCG/ACT nasal spray Place 2 sprays into both nostrils daily. 16 g 6  . lisinopril (PRINIVIL,ZESTRIL) 2.5 MG tablet TAKE 1 TABLET (2.5 MG TOTAL) BY MOUTH DAILY. (Patient taking differently: Take 2.5 mg by mouth daily. TAKE 1 TABLET (2.5 MG TOTAL) BY MOUTH DAILY.) 90 tablet 3  . metoprolol succinate (TOPROL-XL) 50 MG 24 hr tablet Take 1 tablet (50 mg total) by mouth daily. Take with or  immediately following a meal. 90 tablet 3  . pravastatin (PRAVACHOL) 40 MG tablet Take 1 tablet by mouth  daily 90 tablet 1  . metoprolol (LOPRESSOR) 50 MG tablet TAKE 1 TABLET (50 MG TOTAL) BY MOUTH 2 (TWO) TIMES DAILY. (Patient taking differently: Take 50 mg by mouth daily. TAKE 1 TABLET (50 MG TOTAL) BY MOUTH 2 (TWO) TIMES DAILY.) 180 tablet 3   No facility-administered medications prior to visit.     Allergies:  Allergies  Allergen Reactions  . Actos [Pioglitazone] Other (See Comments)    Cramps in legs   . Morphine  REACTION: hives  . Statins     REACTION: leg cramps but tolerates pravastatin    Social History   Social History  . Marital Status: Married    Spouse Name: N/A  . Number of Children: N/A  . Years of Education: N/A   Occupational History  . Not on file.   Social History Main Topics  . Smoking status: Former Smoker -- 1.00 packs/day for 30 years    Quit date: 06/10/1999  . Smokeless tobacco: Former Systems developer    Types: [place] date: 04/09/2014     Comment: Quit >15 years ago  . Alcohol Use: No  . Drug Use: No  . Sexual Activity: Not Currently   Other Topics Concern  . Not on file   Social History Narrative   Married for >44 years    Family History  Problem Relation Age of Onset  . Heart disease Mother   . Congestive Heart Failure Father      Review of Systems:  See HPI for pertinent ROS. All other ROS negative.    Physical Exam: Blood pressure 122/80, pulse 68, temperature 97.5 F (36.4 C), temperature source Oral, resp. rate 18, weight 155 lb (70.308 kg)., Body mass index is 23.57 kg/(m^2). General: Thin WM. Appears in no acute distress. Neck: Bilateral Carotid Bruits.  Lungs: Crackles at bases bilaterally. O/w clear.  Heart: RRR with S1 S2. I/VI murmur at 2nd ICS on Right Musculoskeletal:  Strength and tone normal for age. Extremities/Skin: Warm and dry.  No edema.  Forearms with multiple scattered areas of deep reddish-purplish  purpura.  Neuro: Alert and oriented X 3. Moves all extremities spontaneously. Gait is normal. CNII-XII grossly in tact. Psych:  Responds to questions appropriately with a normal affect. Diabetic Foot Exam: Inspection: Normal. No wounds, no callouses, worisome areas. Sensation intact. 1+-2+ DP pulses bilaterally. Trace PT pulses bilaterally.      ASSESSMENT AND PLAN:  75 y.o. year old male with    Subclinical hypothyroidism - TSH - T4, free  Osteopenia determined by x-ray Prior X-ray showed osteopenia. Patient has never had bone density scan. Discussed having bone density scan and he is agreeable. - DG Bone Density; Future  Left hip pain --will obtain XRay  S/P AVR (aortic valve replacement) --Managed by Cardiology  Cardiomyopathy, dilated, nonischemic --Managed by Cardiology  Carotid artery stenosis, bilateral In the past, cardiology was ordering his carotid artery ultrasounds. However, patient had stopped following up with cardiology as he could not afford to pay the co-pay. Because of this financial situation, I  Ordered Echo and carotid artery Dopplers more recently.  And I ordered another set of carotid Dopplers which was performed on 10/01/14. Stable. 60-79% bilateral ICA stenosis. Follow-up one year. --F/U performed 09/2015:  Stable. 60-79% bilateral ICA stenosis. Follow-up one year.  Bilateral carotid bruits - see #3 above  Type 2 diabetes mellitus with hyperglycemia - Hemoglobin A1c  Last microalbumin: 02/2014, 04/04/2015   He provides excellent foot care. Reminded him of proper foot care and wearing shoes at all times. Diabetic Foot Exam Documented in Quality Metrics 04/04/2015   He is overdue for ophthalmology exam. He states that he cannot afford this. He has a high co-pay.   He is on ASA 81mg , on ACE inhibitor, and on statin therapy.  He has been off Metformin since hospitalization--see HPI. At 04/04/2015 OV---Told him that since BS has been so well  controlled, can change to Routine OVs Q 6 months, instead of every  3 months.  Hyperlipidemia Last FLP and LFTs were 09/02/14. That time LDL was up just a little bit and patient did state that he had been out of his pravastatin recently but was picking up refill. Today patient states that he has restarted the pravastatin and is taking it as directed. 04/04/15--Fasting--Recheck  Essential hypertension Blood pressure is at goal. Continue current medications. On ACE inhibitor and beta blocker. Check lab to monitor.  Weight Loss Has had evaluation by myself as well as Dr. Wonda Amis. Also, at [place] 04/04/2015---Realized that he has no lower teeth and that those dentures have been broken for long time and he cannot afford new ones--he says he has to eat soft foods.  Weight is stable.  CONSTIPATION  In past was treating with Linzess, Mirilax Not a current problem.   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 Also he is on aspirin 81 mg daily and this is also contributing to this.  BENIGN PROSTATIC HYPERTROPHY, WITH OBSTRUCTION  A screening PSA was done 09/25/12 and was normal. He is now 75 years old so can stop doing screening PSA.   Screening colonoscopy: 06/03/2009. Repeat 10-15 years. Had Colonoscopy --Dr. Rehman--11/26/13--Polyps. Internal and External Hemorrhoids.   Immunizations:  Pneumonia vaccine: Patient reports that he had this at age 50 his prior PCP  .--He would have received  Pneumovax 23.   Prevnar  13 given here 09/02/14.  Tetanus vaccine: Patient recently checked with his insurance and is going to cost him $40 and he deferred.  Zostavax: Patient check with his insurance. Too expensive. The patient defers.  Influenza vaccine: Received this 04/20/14      F/U 6 months, sooner if needed.   Signed,     Signed, [address] [place], [place], [facility] 10/05/2015 9:23 AM

## 2015-10-07 ENCOUNTER — Other Ambulatory Visit: Payer: Self-pay | Admitting: Family Medicine

## 2015-10-07 MED ORDER — METFORMIN HCL 1000 MG PO TABS: 1000.0000 mg | ORAL_TABLET | Freq: Two times a day (BID) | ORAL | Status: DC

## 2015-10-10 DIAGNOSIS — E119 Type 2 diabetes mellitus without complications: Secondary | ICD-10-CM | POA: Diagnosis not present

## 2015-10-24 ENCOUNTER — Ambulatory Visit (INDEPENDENT_AMBULATORY_CARE_PROVIDER_SITE_OTHER): Payer: Medicare Other | Admitting: Physician Assistant

## 2015-10-24 ENCOUNTER — Encounter: Payer: Self-pay | Admitting: Physician Assistant

## 2015-10-24 VITALS — BP 108/56 | HR 64 | Temp 97.6°F | Resp 18 | Wt 152.0 lb

## 2015-10-24 DIAGNOSIS — M810 Age-related osteoporosis without current pathological fracture: Secondary | ICD-10-CM

## 2015-10-24 DIAGNOSIS — E1165 Type 2 diabetes mellitus with hyperglycemia: Secondary | ICD-10-CM

## 2015-10-24 MED ORDER — OMEPRAZOLE 20 MG PO CPDR: 20.0000 mg | DELAYED_RELEASE_CAPSULE | Freq: Every day | ORAL | Status: DC

## 2015-10-24 NOTE — Progress Notes (Addendum)
Patient ID: [name] MRN: [id number], DOB: 07/23/1941, 75 y.o. Date of Encounter: @DATE @  Chief Complaint:  Chief Complaint  Patient presents with  . 2 wk follow up    is fasting    HPI: 75 y.o. year old white male  presents today for routine followup office visit.   On 04/13/2014 he had aortic valve replacement secondary to severe aortic stenosis.  Had Normal coronaries by cardiac catheterization. Preoperatively he had low EF.  At his office visit with me 05/26/14, pt stated that they did give him metformin in the hospital, but said that he had not been taking it since he got home because his blood sugars were low.  He stated that even off of Metformin, his fasting morning blood sugars had been around 138 and 99. At that visit I told him to stay off of metformin. I reviewed the fact that he had experienced further weight loss with the hospitalization and surgery--I'm sure this is contributing to his decreased sugar.  AT OV 12/2014: He states that he checks his blood sugar about once a week. Sometimes fasting or sometimes during the day.  Says this morning fasting reading was 130. Says that he usually gets around 100 to 130 for fasting readings.  States that he takes his dog for a walk every morning. They usually go around the block and then back around. Says that he feels pretty good with this exertion.  He is taking medications as directed with no adverse effects. No lightheadedness. No presyncope.  His last office visit with me prior to the hospitlaization was 02/22/2014. The following is copied from that office visit note.:  At his routine OV 09/28/2013--at that visit I noticed that he looked even thinner that day than usual. I then noticed that I had noted at his prior visit with me 06/29/13 that he had also lost weight at the time of that visit. At that visit , I initially assumed that his weight loss was intentional  secondary to dietary changes to help control his  diabetes.  However, at that visit 06/29/13 he reported that he decrease his dietary intake secondary to finances. At that visit he stated   "I just have to eat what I have.   I can't afford anything more."  Weight February 2014   was 167. Weight 06/29/13             was 155. Weight 09/28/13               was 147.  09/28/13---checked labs to evaluate underlying medical causes of weight loss including CBC TSH and CME T. These were all normal.  We also discussed the fact he had a screening colonoscopy 06/03/2009 and he was told to repeat 10-15 years.  He did come in for weight check and his weight was stable at that time. Say when he came in for that we checked our staff gave him some contact information --he could call for some assistance.  At [place] 09/28/13 he says that he is using [employer] Says that they get him through to every other Thursday. He gives very pick this up. Says that he has other phone numbers that he could call for food if he needs to but hasn't needed to so far.  He had colonoscopy by Dr. Jearld Adjutant on 12/12/13. There were polyps which were biopsied. External and internal hemorrhoids. At that time Dr. Wonda Amis planned to order CT of abdomen and pelvis as well  as chest.  He had a CT scan on 12/09/13. No malignancy was seen on either CT scan. No significant finding seen on the CT scans.  Today he says that he continues to  get food once a month from 4 or 5 different places--- [employer] out reach, [employer], [employer], their church.  Weight 12/29/14 remains stable at 146.  At visit 12/29/14 I also reviewed last cardiology note.  Saw NP at Cardiology 11/22/14. Things were stable at that visit and he was to continue current medications. No labs or tests were indicated/ ordered at that time and they plan for follow-up 6 months.  04/04/2015:  He reports that sometimes when he first stands up, his left "hip" "is stiff--takes a minute to straighten up". Says there is no pain,  just stiffness.  Does not want to pursue imaging, further eval right now sec to finances--will monitor. No other complaints or concerns. Has been feeling pretty good. Still checks blood sugar once a week--fasting---gets 120s, 133, "something like that"  10/05/2015: He has no complaints during the visit. Reports he had his follow-up carotid artery ultrasound on Monday but has not heard results yet. He is taking cholesterol medication as directed. No myalgias or other adverse effects. He is taking blood pressure medications as directed with no adverse effects. When he gets up off of the exam table to go to the lab, he moves very slowly and stiff holding to his left hip. Asked what area was bothering him he points to the anterior aspect of the left hip.  10/24/2015: At [place] 10/05/2015--A1C came back high at 8.9. 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  When I got that lab result, told him to start Metformin 1,000mg  BID and check fasting BS QAM and come for f/u OV 2 weeks.  Today he states that he is taking the metformin 1000 mg twice a day as directed. Says it is causing him quite a bit of gas and some heartburn. Says that it's only causing a little bit of diarrhea. Says he takes a Tums and that helps the heartburn. Then adds that these symptoms were worse when he first started the Metformin and that the symptoms are getting better.  He does bring in blood sugar log sheet. Fasting morning readings have been 167, 129, 137, 125, 115, 138, 108, 136, 111, 109, 143, 145, 114, 130.   Past Medical History  Diagnosis Date  . Hyperlipidemia   . Hypertension   . Vitamin D deficiency   . Diabetes mellitus   . GERD (gastroesophageal reflux disease)   . BPH (benign prostatic hypertrophy)   . History of cataract   . Constipation   . CAD (coronary artery disease)   . Aortic stenosis, mild   . Cancer (HCC)     spindle cell cancer of left ear  . Nephrolithiasis      Home Meds: Outpatient Prescriptions  Prior to Visit  Medication Sig Dispense Refill  . aspirin 81 MG EC tablet Take 1 tablet (81 mg total) by mouth daily. Swallow whole. 30 tablet 12  . Cholecalciferol (VITAMIN D) 2000 UNITS tablet Take 2,000 Units by mouth daily.    . fish oil-omega-3 fatty acids 1000 MG capsule Take 1 g by mouth daily.     Marland Kitchen lisinopril (PRINIVIL,ZESTRIL) 2.5 MG tablet TAKE 1 TABLET (2.5 MG TOTAL) BY MOUTH DAILY. (Patient taking differently: Take 2.5 mg by mouth daily. TAKE 1 TABLET (2.5 MG TOTAL) BY MOUTH DAILY.) 90 tablet 3  .  metFORMIN (GLUCOPHAGE) 1000 MG tablet Take 1 tablet (1,000 mg total) by mouth 2 (two) times daily with a meal. 180 tablet 0  . metoprolol succinate (TOPROL-XL) 50 MG 24 hr tablet Take 1 tablet (50 mg total) by mouth daily. Take with or immediately following a meal. 90 tablet 3  . pravastatin (PRAVACHOL) 40 MG tablet Take 1 tablet by mouth  daily 90 tablet 1  . fluticasone (FLONASE) 50 MCG/ACT nasal spray Place 2 sprays into both nostrils daily. (Patient not taking: Reported on 10/24/2015) 16 g 6   No facility-administered medications prior to visit.     Allergies:  Allergies  Allergen Reactions  . Actos [Pioglitazone] Other (See Comments)    Cramps in legs   . Morphine     REACTION: hives  . Statins     REACTION: leg cramps but tolerates pravastatin    Social History   Social History  . Marital Status: Married    Spouse Name: N/A  . Number of Children: N/A  . Years of Education: N/A   Occupational History  . Not on file.   Social History Main Topics  . Smoking status: Former Smoker -- 1.00 packs/day for 30 years    Quit date: 06/10/1999  . Smokeless tobacco: Former Systems developer    Types: [place] date: 04/09/2014     Comment: Quit >15 years ago  . Alcohol Use: No  . Drug Use: No  . Sexual Activity: Not Currently   Other Topics Concern  . Not on file   Social History Narrative   Married for >44 years    Family History  Problem Relation Age of Onset  . Heart  disease Mother   . Congestive Heart Failure Father      Review of Systems:  See HPI for pertinent ROS. All other ROS negative.    Physical Exam: Blood pressure 108/56, pulse 64, temperature 97.6 F (36.4 C), temperature source Oral, resp. rate 18, weight 152 lb (68.947 kg)., Body mass index is 23.12 kg/(m^2). General: Thin WM. Appears in no acute distress. Neck: Bilateral Carotid Bruits.  Lungs: Crackles at bases bilaterally. O/w clear.  Heart: RRR with S1 S2. I/VI murmur at 2nd ICS on Right Musculoskeletal:  Strength and tone normal for age. Extremities/Skin: Warm and dry.  No edema.  Forearms with multiple scattered areas of deep reddish-purplish purpura.  Neuro: Alert and oriented X 3. Moves all extremities spontaneously. Gait is normal. CNII-XII grossly in tact. Psych:  Responds to questions appropriately with a normal affect. Diabetic Foot Exam: Inspection: Normal. No wounds, no callouses, worisome areas. Sensation intact. 1+-2+ DP pulses bilaterally. Trace PT pulses bilaterally.      ASSESSMENT AND PLAN:  75 y.o. year old male with    AT OV 10/24/2015---The Only Thing Addressed was Diabetes: 1. Type 2 diabetes mellitus with hyperglycemia, without long-term current use of insulin (HCC) ---Omeprazole 20mg  1 po QD--This Rx was sent in/added---Told him to take this daily. See if GI symptoms controlled with this.  If continues with significant GI symptoms, call me and will then have to decrease metformin to 500 mg twice a day and then decide on another agent to add with this. Concerned of hypoglycemia with sulfonylurea. Concern of using Actos given his cardiac history.  Does not have to continue checking blood sugar every single morning. Can just check with 1-2 mornings per week. Follow-up office visit 3 months or sooner if needed.     THE FOLLOWING IS COPIED FROM  10/05/2015 OV---WAS NOT REVIEWED/UPDATED AT OV 10/24/2015:  Subclinical hypothyroidism - TSH - T4,  free  Osteopenia determined by x-ray Prior X-ray showed osteopenia. Patient has never had bone density scan. Discussed having bone density scan and he is agreeable. - DG Bone Density; Future Addendum Added 11/14/15--- DEXA shows Osteoporosis.  Pt to start Fosamax 70mg  once weekly.  Osteoporosis --S/P DEXA 11/2015 --Started Fosamax 11/14/2015  Left hip pain --will obtain XRay  S/P AVR (aortic valve replacement) --Managed by Cardiology  Cardiomyopathy, dilated, nonischemic --Managed by Cardiology  Carotid artery stenosis, bilateral In the past, cardiology was ordering his carotid artery ultrasounds. However, patient had stopped following up with cardiology as he could not afford to pay the co-pay. Because of this financial situation, I  Ordered Echo and carotid artery Dopplers more recently.  And I ordered another set of carotid Dopplers which was performed on 10/01/14. Stable. 60-79% bilateral ICA stenosis. Follow-up one year. --F/U performed 09/2015:  Stable. 60-79% bilateral ICA stenosis. Follow-up one year.  Bilateral carotid bruits - see #3 above  Type 2 diabetes mellitus with hyperglycemia - Hemoglobin A1c  Last microalbumin: 02/2014, 04/04/2015   He provides excellent foot care. Reminded him of proper foot care and wearing shoes at all times. Diabetic Foot Exam Documented in Quality Metrics 04/04/2015   He is overdue for ophthalmology exam. He states that he cannot afford this. He has a high co-pay.   He is on ASA 81mg , on ACE inhibitor, and on statin therapy.  He has been off Metformin since hospitalization--see HPI. At 04/04/2015 OV---Told him that since BS has been so well controlled, can change to Routine OVs Q 6 months, instead of every 3 months.  Hyperlipidemia Last FLP and LFTs were 09/02/14. That time LDL was up just a little bit and patient did state that he had been out of his pravastatin recently but was picking up refill. Today patient states that he has  restarted the pravastatin and is taking it as directed. 04/04/15--Fasting--Recheck  Essential hypertension Blood pressure is at goal. Continue current medications. On ACE inhibitor and beta blocker. Check lab to monitor.  Weight Loss Has had evaluation by myself as well as Dr. Wonda Amis. Also, at [facility] 04/04/2015---Realized that he has no lower teeth and that those dentures have been broken for long time and he cannot afford new ones--he says he has to eat soft foods.  Weight is stable.  CONSTIPATION  In past was treating with Linzess, Mirilax Not a current problem.   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 Also he is on aspirin 81 mg daily and this is also contributing to this.  BENIGN PROSTATIC HYPERTROPHY, WITH OBSTRUCTION  A screening PSA was done 09/25/12 and was normal. He is now 75 years old so can stop doing screening PSA.   Screening colonoscopy: 06/03/2009. Repeat 10-15 years. Had Colonoscopy --Dr. Rehman--11/26/13--Polyps. Internal and External Hemorrhoids.   Immunizations:  Pneumonia vaccine: Patient reports that he had this at age 54 his prior PCP  .--He would have received  Pneumovax 23.   Prevnar  13 given here 09/02/14.  Tetanus vaccine: Patient recently checked with his insurance and is going to cost him $40 and he deferred.  Zostavax: Patient check with his insurance. Too expensive. The patient defers.  Influenza vaccine: Received this 04/20/14      F/U 6 months, sooner if needed.   Signed,     Signed, [address] [place], [place], [place]  10/24/2015 9:21 AM

## 2015-11-01 ENCOUNTER — Other Ambulatory Visit: Payer: Self-pay | Admitting: Family Medicine

## 2015-11-01 MED ORDER — LISINOPRIL 2.5 MG PO TABS: ORAL_TABLET | ORAL | Status: DC

## 2015-11-01 NOTE — Telephone Encounter (Signed)
Medication refilled per protocol. 

## 2015-11-02 ENCOUNTER — Encounter: Payer: Self-pay | Admitting: *Deleted

## 2015-11-14 ENCOUNTER — Ambulatory Visit (HOSPITAL_COMMUNITY)
Admission: RE | Admit: 2015-11-14 | Discharge: 2015-11-14 | Disposition: A | Discharge status: Home / Self Care | Payer: Medicare Other | Source: Ambulatory Visit | Attending: Physician Assistant | Admitting: Physician Assistant

## 2015-11-14 DIAGNOSIS — M810 Age-related osteoporosis without current pathological fracture: Secondary | ICD-10-CM | POA: Diagnosis not present

## 2015-11-14 DIAGNOSIS — M8580 Other specified disorders of bone density and structure, unspecified site: Secondary | ICD-10-CM

## 2015-11-15 DIAGNOSIS — M810 Age-related osteoporosis without current pathological fracture: Secondary | ICD-10-CM | POA: Insufficient documentation

## 2015-11-16 ENCOUNTER — Other Ambulatory Visit: Payer: Self-pay | Admitting: Physician Assistant

## 2015-11-16 MED ORDER — ALENDRONATE SODIUM 70 MG PO TABS: 70.0000 mg | ORAL_TABLET | ORAL | Status: DC

## 2015-12-12 ENCOUNTER — Other Ambulatory Visit: Payer: Self-pay | Admitting: Physician Assistant

## 2015-12-12 NOTE — Telephone Encounter (Signed)
Medication refilled per protocol. 

## 2015-12-20 ENCOUNTER — Encounter: Payer: Self-pay | Admitting: Cardiology

## 2015-12-20 ENCOUNTER — Ambulatory Visit (INDEPENDENT_AMBULATORY_CARE_PROVIDER_SITE_OTHER): Payer: Medicare Other | Admitting: Cardiology

## 2015-12-20 VITALS — BP 138/72 | HR 80 | Ht 68.0 in | Wt 153.0 lb

## 2015-12-20 DIAGNOSIS — I350 Nonrheumatic aortic (valve) stenosis: Secondary | ICD-10-CM | POA: Diagnosis not present

## 2015-12-20 DIAGNOSIS — I2510 Atherosclerotic heart disease of native coronary artery without angina pectoris: Secondary | ICD-10-CM

## 2015-12-20 DIAGNOSIS — I5022 Chronic systolic (congestive) heart failure: Secondary | ICD-10-CM

## 2015-12-20 DIAGNOSIS — E785 Hyperlipidemia, unspecified: Secondary | ICD-10-CM

## 2015-12-20 NOTE — Patient Instructions (Signed)
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
## 2015-12-22 ENCOUNTER — Telehealth: Payer: Self-pay | Admitting: Family Medicine

## 2015-12-22 NOTE — Telephone Encounter (Signed)
Believes Metformin is giving him diarrhea.  Started about 2 weeks after beginning the medication and has continued.

## 2015-12-23 ENCOUNTER — Ambulatory Visit (HOSPITAL_COMMUNITY)
Admission: RE | Admit: 2015-12-23 | Discharge: 2015-12-23 | Disposition: A | Discharge status: Home / Self Care | Payer: Medicare Other | Source: Ambulatory Visit | Attending: Cardiology | Admitting: Cardiology

## 2015-12-23 DIAGNOSIS — K219 Gastro-esophageal reflux disease without esophagitis: Secondary | ICD-10-CM | POA: Insufficient documentation

## 2015-12-23 DIAGNOSIS — Z952 Presence of prosthetic heart valve: Secondary | ICD-10-CM | POA: Insufficient documentation

## 2015-12-23 DIAGNOSIS — I10 Essential (primary) hypertension: Secondary | ICD-10-CM | POA: Diagnosis not present

## 2015-12-23 DIAGNOSIS — I5022 Chronic systolic (congestive) heart failure: Secondary | ICD-10-CM | POA: Insufficient documentation

## 2015-12-23 DIAGNOSIS — I071 Rheumatic tricuspid insufficiency: Secondary | ICD-10-CM | POA: Insufficient documentation

## 2015-12-23 DIAGNOSIS — E119 Type 2 diabetes mellitus without complications: Secondary | ICD-10-CM | POA: Insufficient documentation

## 2015-12-23 DIAGNOSIS — I2510 Atherosclerotic heart disease of native coronary artery without angina pectoris: Secondary | ICD-10-CM | POA: Diagnosis not present

## 2015-12-23 DIAGNOSIS — E785 Hyperlipidemia, unspecified: Secondary | ICD-10-CM | POA: Diagnosis not present

## 2015-12-23 MED ORDER — PERFLUTREN LIPID MICROSPHERE
1.0000 mL | INTRAVENOUS | Status: AC | PRN
  Administered 2015-12-23 (×2): 2 mL via INTRAVENOUS
  Filled 2015-12-23: qty 10

## 2015-12-23 NOTE — Telephone Encounter (Signed)
He is on 1,000mg  BID. Have him decrease to 500mg  BID # 60+5 Tell him to take this dose and if still having diarrhea in 2 weeks, call me.

## 2015-12-26 MED ORDER — METFORMIN HCL 500 MG PO TABS: 500.0000 mg | ORAL_TABLET | Freq: Two times a day (BID) | ORAL | Status: DC

## 2015-12-26 NOTE — Telephone Encounter (Signed)
Pt aware to cut back to 1/2 dose of metformin.  Rx to mail order for the 500 mg tablets.  In the meantime cut 1000 mg tablets in half.  Let us know in 2 weeks if diarrhea continues

## 2016-01-30 ENCOUNTER — Ambulatory Visit: Payer: Medicare Other | Admitting: Physician Assistant

## 2016-02-13 ENCOUNTER — Other Ambulatory Visit: Payer: Self-pay | Admitting: Physician Assistant

## 2016-02-13 NOTE — Telephone Encounter (Signed)
Refill appropriate and filled per protocol. 

## 2016-03-14 ENCOUNTER — Encounter (INDEPENDENT_AMBULATORY_CARE_PROVIDER_SITE_OTHER): Payer: Self-pay

## 2016-03-14 ENCOUNTER — Encounter: Payer: Self-pay | Admitting: Cardiology

## 2016-03-14 ENCOUNTER — Ambulatory Visit (INDEPENDENT_AMBULATORY_CARE_PROVIDER_SITE_OTHER): Payer: Medicare Other | Admitting: Cardiology

## 2016-03-14 VITALS — BP 134/78 | HR 77 | Ht 68.0 in | Wt 150.0 lb

## 2016-03-14 DIAGNOSIS — I2510 Atherosclerotic heart disease of native coronary artery without angina pectoris: Secondary | ICD-10-CM

## 2016-03-14 DIAGNOSIS — I5022 Chronic systolic (congestive) heart failure: Secondary | ICD-10-CM

## 2016-03-14 DIAGNOSIS — I350 Nonrheumatic aortic (valve) stenosis: Secondary | ICD-10-CM | POA: Diagnosis not present

## 2016-03-14 DIAGNOSIS — E785 Hyperlipidemia, unspecified: Secondary | ICD-10-CM
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
## 2016-03-14 NOTE — Patient Instructions (Signed)

## 2016-03-26 ENCOUNTER — Other Ambulatory Visit: Payer: Self-pay | Admitting: Adult Health

## 2016-04-11 ENCOUNTER — Ambulatory Visit: Payer: Medicare Other | Admitting: Physician Assistant

## 2016-04-16 ENCOUNTER — Encounter: Payer: Self-pay | Admitting: Physician Assistant

## 2016-04-16 ENCOUNTER — Ambulatory Visit (INDEPENDENT_AMBULATORY_CARE_PROVIDER_SITE_OTHER): Payer: Medicare Other | Admitting: Physician Assistant

## 2016-04-16 VITALS — BP 134/74 | HR 71 | Temp 97.4°F | Resp 18 | Wt 152.0 lb

## 2016-04-16 DIAGNOSIS — E1165 Type 2 diabetes mellitus with hyperglycemia: Secondary | ICD-10-CM

## 2016-04-16 DIAGNOSIS — Z954 Presence of other heart-valve replacement: Secondary | ICD-10-CM

## 2016-04-16 DIAGNOSIS — I420 Dilated cardiomyopathy: Secondary | ICD-10-CM

## 2016-04-16 DIAGNOSIS — I10 Essential (primary) hypertension: Secondary | ICD-10-CM

## 2016-04-16 DIAGNOSIS — Z952 Presence of prosthetic heart valve: Secondary | ICD-10-CM

## 2016-04-16 DIAGNOSIS — I359 Nonrheumatic aortic valve disorder, unspecified: Secondary | ICD-10-CM | POA: Diagnosis not present

## 2016-04-16 DIAGNOSIS — R0989 Other specified symptoms and signs involving the circulatory and respiratory systems: Secondary | ICD-10-CM

## 2016-04-16 DIAGNOSIS — I5022 Chronic systolic (congestive) heart failure: Secondary | ICD-10-CM | POA: Diagnosis not present

## 2016-04-16 DIAGNOSIS — E038 Other specified hypothyroidism: Secondary | ICD-10-CM

## 2016-04-16 DIAGNOSIS — I350 Nonrheumatic aortic (valve) stenosis: Secondary | ICD-10-CM | POA: Diagnosis not present

## 2016-04-16 DIAGNOSIS — M810 Age-related osteoporosis without current pathological fracture: Secondary | ICD-10-CM | POA: Diagnosis not present

## 2016-04-16 DIAGNOSIS — D692 Other nonthrombocytopenic purpura: Secondary | ICD-10-CM | POA: Diagnosis not present

## 2016-04-16 DIAGNOSIS — E039 Hypothyroidism, unspecified: Secondary | ICD-10-CM

## 2016-04-16 DIAGNOSIS — Z860100 Personal history of colon polyps, unspecified: Secondary | ICD-10-CM

## 2016-04-16 DIAGNOSIS — I429 Cardiomyopathy, unspecified: Secondary | ICD-10-CM | POA: Diagnosis not present

## 2016-04-16 DIAGNOSIS — E785 Hyperlipidemia, unspecified: Secondary | ICD-10-CM

## 2016-04-16 DIAGNOSIS — I6523 Occlusion and stenosis of bilateral carotid arteries: Secondary | ICD-10-CM

## 2016-04-16 LAB — LIPID PANEL
CHOL/HDL RATIO: 4.4 ratio (ref <=5.0)
CHOLESTEROL: 168 mg/dL (ref 125–200)
HDL: 38 mg/dL — AB (ref >=40)
LDL Cholesterol: 103 mg/dL (ref <130)
Triglycerides: 136 mg/dL (ref <150)
VLDL: 27 mg/dL (ref <30)

## 2016-04-16 LAB — COMPLETE METABOLIC PANEL WITH GFR
ALT: 21 U/L (ref 9–46)
AST: 21 U/L (ref 10–35)
Albumin: 4.2 g/dL (ref 3.6–5.1)
Alkaline Phosphatase: 95 U/L (ref 40–115)
BILIRUBIN TOTAL: 0.9 mg/dL (ref 0.2–1.2)
BUN: 14 mg/dL (ref 7–25)
CHLORIDE: 100 mmol/L (ref 98–110)
CO2: 26 mmol/L (ref 20–31)
CREATININE: 0.88 mg/dL (ref 0.70–1.18)
Calcium: 9.6 mg/dL (ref 8.6–10.3)
GFR, EST NON AFRICAN AMERICAN: 83 mL/min (ref >=60)
GFR, Est African American: >89 mL/min (ref >=60)
GLUCOSE: 197 mg/dL — AB (ref 70–99)
Potassium: 4.5 mmol/L (ref 3.5–5.3)
SODIUM: 136 mmol/L (ref 135–146)
TOTAL PROTEIN: 6.9 g/dL (ref 6.1–8.1)

## 2016-04-16 LAB — HEMOGLOBIN A1C
Hgb A1c MFr Bld: 8.1 % — ABNORMAL HIGH (ref <5.7)
MEAN PLASMA GLUCOSE: 186 mg/dL

## 2016-04-16 NOTE — Progress Notes (Signed)
Patient ID: [name] MRN: [id number], DOB: [birth year], 75 y.o. Date of Encounter: @DATE @  Chief Complaint:  Chief Complaint  Patient presents with  . Follow-up    HPI: 75 y.o. year old white male  presents today for routine followup office visit.   On 04/13/2014 he had aortic valve replacement secondary to severe aortic stenosis.  Had Normal coronaries by cardiac catheterization. Preoperatively he had low EF.  At his office visit with me 05/26/14, pt stated that they did give him metformin in the hospital, but said that he had not been taking it since he got home because his blood sugars were low.  He stated that even off of Metformin, his fasting morning blood sugars had been around 138 and 99. At that visit I told him to stay off of metformin. I reviewed the fact that he had experienced further weight loss with the hospitalization and surgery--I'm sure this is contributing to his decreased sugar.  AT OV 12/2014: He states that he checks his blood sugar about once a week. Sometimes fasting or sometimes during the day.  Says this morning fasting reading was 130. Says that he usually gets around 100 to 130 for fasting readings.  States that he takes his dog for a walk every morning. They usually go around the block and then back around. Says that he feels pretty good with this exertion.  He is taking medications as directed with no adverse effects. No lightheadedness. No presyncope.  His last office visit with me prior to the hospitlaization was 02/22/2014. The following is copied from that office visit note.:  At his routine OV 09/28/2013--at that visit I noticed that he looked even thinner that day than usual. I then noticed that I had noted at his prior visit with me 06/29/13 that he had also lost weight at the time of that visit. At that visit , I initially assumed that his weight loss was intentional  secondary to dietary changes to help control his diabetes.  However, at  that visit 06/29/13 he reported that he decrease his dietary intake secondary to finances. At that visit he stated   "I just have to eat what I have.   I can't afford anything more."  Weight February 2014   was 167. Weight 06/29/13             was 155. Weight 09/28/13               was 147.  09/28/13---checked labs to evaluate underlying medical causes of weight loss including CBC TSH and CME T. These were all normal.  We also discussed the fact he had a screening colonoscopy 06/03/2009 and he was told to repeat 10-15 years.  He did come in for weight check and his weight was stable at that time. Say when he came in for that we checked our staff gave him some contact information --he could call for some assistance.  At [place] 09/28/13 he says that he is using [employer] Says that they get him through to every other Thursday. He gives very pick this up. Says that he has other phone numbers that he could call for food if he needs to but hasn't needed to so far.  He had colonoscopy by Dr. Jearld Adjutant on 12/12/13. There were polyps which were biopsied. External and internal hemorrhoids. At that time Dr. Wonda Amis planned to order CT of abdomen and pelvis as well as chest.  He had a CT scan  on 12/09/13. No malignancy was seen on either CT scan. No significant finding seen on the CT scans.  Today he says that he continues to  get food once a month from 4 or 5 different places--- [employer] out reach, [employer], [employer], their church.  Weight 12/29/14 remains stable at 146.  At visit 12/29/14 I also reviewed last cardiology note.  Saw NP at Cardiology 11/22/14. Things were stable at that visit and he was to continue current medications. No labs or tests were indicated/ ordered at that time and they plan for follow-up 6 months.  04/04/2015:  He reports that sometimes when he first stands up, his left "hip" "is stiff--takes a minute to straighten up". Says there is no pain, just stiffness.  Does  not want to pursue imaging, further eval right now sec to finances--will monitor. No other complaints or concerns. Has been feeling pretty good. Still checks blood sugar once a week--fasting---gets 120s, 133, "something like that"  10/05/2015: He has no complaints during the visit. Reports he had his follow-up carotid artery ultrasound on Monday but has not heard results yet. He is taking cholesterol medication as directed. No myalgias or other adverse effects. He is taking blood pressure medications as directed with no adverse effects. When he gets up off of the exam table to go to the lab, he moves very slowly and stiff holding to his left hip. Asked what area was bothering him he points to the anterior aspect of the left hip.  10/24/2015: At [place] 10/05/2015--A1C came back high at 8.9. 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  When I got that lab result, told him to start Metformin 1,000mg  BID and check fasting BS QAM and come for f/u OV 2 weeks.  Today he states that he is taking the metformin 1000 mg twice a day as directed. Says it is causing him quite a bit of gas and some heartburn. Says that it's only causing a little bit of diarrhea. Says he takes a Tums and that helps the heartburn. Then adds that these symptoms were worse when he first started the Metformin and that the symptoms are getting better.  He does bring in blood sugar log sheet. Fasting morning readings have been 167, 129, 137, 125, 115, 138, 108, 136, 111, 109, 143, 145, 114, 130.  12/22/2015--- Phone message on this date that patient called reporting having diarrhea with the metformin 1000 twice a day. At time he was told to decrease down to 500 mg twice a day.  04/16/2016: Pt reports that he has decreased the metformin down to 500 mg once a day. Says he still has some diarrhea even with this low dose. He checks his blood sugar about twice a week. Says yesterday he got 158 and that was one or 2 hours after he ate. No other  complaints or concerns today. That he is feeling good. Taking blood pressure medication as directed with no lightheadedness or other adverse effects. Eating pravastatin with no myalgias or other adverse effects.   Past Medical History:  Diagnosis Date  . Aortic stenosis, mild   . BPH (benign prostatic hypertrophy)   . CAD (coronary artery disease)   . 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 Constipation   . Diabetes mellitus   . GERD (gastroesophageal reflux disease)   . History of cataract   . Hyperlipidemia   . Hypertension   . Nephrolithiasis   . Vitamin D deficiency  Home Meds: Outpatient Medications Prior to Visit  Medication Sig Dispense Refill  . aspirin 81 MG EC tablet Take 1 tablet (81 mg total) by mouth daily. Swallow whole. 30 tablet 12  . Cholecalciferol (VITAMIN D) 2000 UNITS tablet Take 2,000 Units by mouth daily.    . fish oil-omega-3 fatty acids 1000 MG capsule Take 1 g by mouth daily.     Marland Kitchen lisinopril (PRINIVIL,ZESTRIL) 2.5 MG tablet TAKE 1 TABLET (2.5 MG TOTAL) BY MOUTH DAILY. 90 tablet 3  . metFORMIN (GLUCOPHAGE) 500 MG tablet Take 1 tablet by mouth two  times daily with a meal (Patient taking differently: 1 time daily with food) 180 tablet 0  . metoprolol succinate (TOPROL-XL) 50 MG 24 hr tablet Take 1 tablet (50 mg total) by mouth daily. Take with or immediately following a meal. 90 tablet 3  . omeprazole (PRILOSEC) 20 MG capsule Take 1 capsule by mouth  daily 90 capsule 1  . pravastatin (PRAVACHOL) 40 MG tablet Take 1 tablet by mouth  daily 90 tablet 3  . alendronate (FOSAMAX) 70 MG tablet Take 1 tablet (70 mg total) by mouth every 7 (seven) days. Take with a full glass of water on an empty stomach. (Patient not taking: Reported on 04/16/2016) 12 tablet 3   No facility-administered medications prior to visit.      Allergies:  Allergies  Allergen Reactions  . Actos [Pioglitazone] Other (See Comments)    Cramps in legs   . Morphine      REACTION: hives  . Statins     REACTION: leg cramps but tolerates pravastatin    Social History   Social History  . Marital status: Married    Spouse name: N/A  . Number of children: N/A  . Years of education: N/A   Occupational History  . Not on file.   Social History Main Topics  . Smoking status: Former Smoker    Packs/day: 1.00    Years: 30.00    Quit date: 06/10/1999  . Smokeless tobacco: Former Systems developer    Types: [place] date: 04/09/2014     Comment: Quit >15 years ago  . Alcohol use No  . Drug use: No  . Sexual activity: Not Currently   Other Topics Concern  . Not on file   Social History Narrative   Married for >44 years    Family History  Problem Relation Age of Onset  . Heart disease Mother   . Congestive Heart Failure Father   . Heart failure Sister 53     Review of Systems:  See HPI for pertinent ROS. All other ROS negative.    Physical Exam: Blood pressure 134/74, pulse 71, temperature 97.4 F (36.3 C), temperature source Oral, resp. rate 18, weight 152 lb (68.9 kg)., Body mass index is 23.11 kg/m. General: Thin WM. Appears in no acute distress. Neck: Bilateral Carotid Bruits.  Lungs: Crackles at bases bilaterally. O/w clear.  Heart: RRR with S1 S2. I/VI murmur at 2nd ICS on Right Musculoskeletal:  Strength and tone normal for age. Extremities/Skin: Warm and dry.  No edema.  Forearms with multiple scattered areas of deep reddish-purplish purpura.  Neuro: Alert and oriented X 3. Moves all extremities spontaneously. Gait is normal. CNII-XII grossly in tact. Psych:  Responds to questions appropriately with a normal affect. Diabetic Foot Exam: Inspection: Normal. No wounds, no callouses, worisome areas. Sensation intact. 1+-2+ DP pulses bilaterally. Trace PT pulses bilaterally.      ASSESSMENT AND PLAN:  75 y.o. year old male with     Subclinical hypothyroidism - TSH - T4, free  Osteopenia determined by x-ray Prior X-ray showed osteopenia.  Patient has never had bone density scan. Discussed having bone density scan and he is agreeable. - DG Bone Density; Future Addendum Added 11/14/15--- DEXA shows Osteoporosis.  Pt to start Fosamax 70mg  once weekly.  Osteoporosis --S/P DEXA 11/2015 --Started Fosamax 11/14/2015  Left hip pain --will obtain XRay  S/P AVR (aortic valve replacement) --Managed by Cardiology  Cardiomyopathy, dilated, nonischemic --Managed by Cardiology  Carotid artery stenosis, bilateral In the past, cardiology was ordering his carotid artery ultrasounds. However, patient had stopped following up with cardiology as he could not afford to pay the co-pay. Because of this financial situation, I  Ordered Echo and carotid artery Dopplers more recently.  And I ordered another set of carotid Dopplers which was performed on 10/01/14. Stable. 60-79% bilateral ICA stenosis. Follow-up one year. --F/U performed 09/2015:  Stable. 60-79% bilateral ICA stenosis. Follow-up one year.  Bilateral carotid bruits - see #3 above  Type 2 diabetes mellitus with hyperglycemia - Hemoglobin A1c  Last microalbumin: 02/2014, 04/04/2015   He provides excellent foot care. Reminded him of proper foot care and wearing shoes at all times. Diabetic Foot Exam Documented in Quality Metrics 04/04/2015   He is overdue for ophthalmology exam. He states that he cannot afford this. He has a high co-pay.   He is on ASA 81mg , on ACE inhibitor, and on statin therapy.  He has been off Metformin since hospitalization--see HPI. At 04/04/2015 OV---Told him that since BS has been so well controlled, can change to Routine OVs Q 6 months, instead of every 3 months. 10/05/2015---A1C was up to 8.9----Restarted Metformin but has had problems with diarrhea---see HPI from 04/16/2016 OV for details.  AT OV 04/16/2016--- is having diarrhea even with metformin 500 mg daily. Therefore I'm going to have to discontinue the metformin. I will wait to get the A1c result  before I decide dose of sulfonylurea. Cannot use Actos given his cardiac history but he is not going to be able to afford any brand name medication.  Hyperlipidemia On pravastatin. Check FLP LFT now. He is fasting.  Essential hypertension Blood pressure is at goal. Continue current medications. On ACE inhibitor and beta blocker. Check lab to monitor.  Weight Loss Has had evaluation by myself as well as Dr. Wonda Amis. Also, at [facility] 04/04/2015---Realized that he has no lower teeth and that those dentures have been broken for long time and he cannot afford new ones--he says he has to eat soft foods.  Weight is stable.  CONSTIPATION  In past was treating with Linzess, Mirilax Not a current problem.   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 Also he is on aspirin 81 mg daily and this is also contributing to this.  BENIGN PROSTATIC HYPERTROPHY, WITH OBSTRUCTION  A screening PSA was done 09/25/12 and was normal. He is now 75 years old so can stop doing screening PSA.   Screening colonoscopy: 06/03/2009. Repeat 10-15 years. Had Colonoscopy --Dr. Rehman--11/26/13--Polyps. Internal and External Hemorrhoids.   Immunizations:  Pneumonia vaccine: Patient reports that he had this at age 38 his prior PCP  .--He would have received  Pneumovax 23.   Prevnar  13 given here 09/02/14.  Tetanus vaccine: Patient recently checked with his insurance and is going to cost him $40 and he deferred.  Zostavax: Patient check with  his insurance. Too expensive. The patient defers.  Influenza vaccine: Received this 04/20/14      F/U 6 months, sooner if needed.   Signed,     Signed, [address] [place], [place], [facility] 04/16/2016 9:13 AM

## 2016-04-17 LAB — MICROALBUMIN, URINE: MICROALB UR: 4.1 mg/dL

## 2016-04-17 LAB — T4, FREE: FREE T4: 1 ng/dL (ref 0.8–1.8)

## 2016-04-17 LAB — TSH: TSH: 6.69 mIU/L — ABNORMAL HIGH (ref 0.40–4.50)

## 2016-04-19 ENCOUNTER — Telehealth: Payer: Self-pay

## 2016-04-19 MED ORDER — GLIPIZIDE 10 MG PO TABS: 10.0000 mg | ORAL_TABLET | Freq: Every day | ORAL | 3 refills | Status: DC

## 2016-04-19 NOTE — Telephone Encounter (Signed)
-----   Message from Orlena Sheldon, PA-C sent at 04/17/2016  5:03 PM EDT ----- Add Glucotrol XL 10mg  1 po QD # 30 +3 Call pt and inform him to add this medication. Tell him to call us if he gets any Blood Sugar readings < 80.  -I did stop the Metformin at his OV Monday---Tell him to stop this.  Tell him to cont all other meds the same with no other changes-- Send above Rx to Pharmacy.

## 2016-04-19 NOTE — Telephone Encounter (Signed)
New Rx sent to pharmacy

## 2016-06-07 ENCOUNTER — Other Ambulatory Visit: Payer: Self-pay | Admitting: Physician Assistant

## 2016-06-07 NOTE — Telephone Encounter (Signed)
Rx refilled per protocol 

## 2016-07-02 ENCOUNTER — Ambulatory Visit (INDEPENDENT_AMBULATORY_CARE_PROVIDER_SITE_OTHER): Payer: Medicare Other | Admitting: Family Medicine

## 2016-07-02 DIAGNOSIS — Z23 Encounter for immunization: Secondary | ICD-10-CM | POA: Diagnosis not present

## 2016-07-06 ENCOUNTER — Other Ambulatory Visit: Payer: Self-pay | Admitting: Interventional Cardiology

## 2016-07-06 ENCOUNTER — Other Ambulatory Visit: Payer: Self-pay | Admitting: Physician Assistant

## 2016-07-10 NOTE — Telephone Encounter (Signed)
Rx filled per protocol  

## 2016-07-16 ENCOUNTER — Ambulatory Visit: Payer: Medicare Other | Admitting: Physician Assistant

## 2016-07-17 ENCOUNTER — Telehealth: Payer: Self-pay

## 2016-07-17 NOTE — Telephone Encounter (Signed)
I rec a fax from [facility] req diabetic supplies as well as lidocaine ointment and hydrocortisone cream be sent to the pt. When I called the pt he stated he did not req any cream and that he was told they could send him a new meter. Pt later decided he will stay with his current pharmacy

## 2016-08-03 ENCOUNTER — Other Ambulatory Visit: Payer: Self-pay

## 2016-08-14 ENCOUNTER — Telehealth: Payer: Self-pay

## 2016-08-14 MED ORDER — ONETOUCH ULTRA SYSTEM W/DEVICE KIT: PACK | 0 refills | Status: DC

## 2016-08-14 MED ORDER — ONETOUCH ULTRASOFT LANCETS MISC: 12 refills | Status: DC

## 2016-08-14 MED ORDER — GLUCOSE BLOOD VI STRP: ORAL_STRIP | 12 refills | Status: DC

## 2016-08-14 NOTE — Telephone Encounter (Signed)
New Rx for onetouch meter and test strips as well as lancets  were ordered for pt. Pt states his old one no longer works.

## 2016-09-19 ENCOUNTER — Other Ambulatory Visit: Payer: Self-pay | Admitting: Cardiology

## 2016-09-19 ENCOUNTER — Other Ambulatory Visit: Payer: Self-pay | Admitting: Physician Assistant

## 2016-09-26 ENCOUNTER — Telehealth: Payer: Self-pay | Admitting: Family Medicine

## 2016-09-26 NOTE — Telephone Encounter (Signed)
Pt called and states that his BS is 332 and wants to know if he should go to the hospital or come here? Informed pt that if he had a large lunch it could shoot up that high. He denied any of the following: dizzy, ha, nausea, vomiting, chest pain, trouble breathing. He just states that his bs is high. Informed pt that if he ate a large lunch then it could go up that high as he is diabetic. Told him to rest and drink plenty of water and recheck in 2 hours to see if it has come down and if he developes any of the mentioned symptoms then he needs to go to hospital. Scheduled an appt for tomorrow to f/u on his blood sugars.

## 2016-09-27 ENCOUNTER — Ambulatory Visit (INDEPENDENT_AMBULATORY_CARE_PROVIDER_SITE_OTHER): Payer: Medicare Other | Admitting: Physician Assistant

## 2016-09-27 ENCOUNTER — Encounter: Payer: Self-pay | Admitting: Physician Assistant

## 2016-09-27 VITALS — BP 110/70 | HR 77 | Temp 97.7°F | Resp 18 | Wt 146.0 lb

## 2016-09-27 DIAGNOSIS — E119 Type 2 diabetes mellitus without complications: Secondary | ICD-10-CM | POA: Diagnosis not present

## 2016-09-27 LAB — GLUCOSE, FINGERSTICK (STAT): Glucose, fingerstick: 236 mg/dL — ABNORMAL HIGH (ref 65–99)

## 2016-09-27 LAB — HEMOGLOBIN A1C, FINGERSTICK: Hgb A1C (fingerstick): 11 % — ABNORMAL HIGH (ref <5.7)

## 2016-09-27 MED ORDER — INSULIN GLARGINE 100 UNIT/ML SOLOSTAR PEN: PEN_INJECTOR | SUBCUTANEOUS | 99 refills | Status: DC

## 2016-09-27 NOTE — Progress Notes (Signed)
Patient ID: [name] MRN: [id number], DOB: 1941/01/16, 76 y.o. Date of Encounter: _0 @  Chief Complaint:  Chief Complaint  Patient presents with  . blood sugar readings high    HPI: 76 y.o. year old white male  presents today for routine followup office visit.   On 04/13/2014 he had aortic valve replacement secondary to severe aortic stenosis.  Had Normal coronaries by cardiac catheterization. Preoperatively he had low EF.  At his office visit with me 05/26/14, pt stated that they did give him metformin in the hospital, but said that he had not been taking it since he got home because his blood sugars were low.  He stated that even off of Metformin, his fasting morning blood sugars had been around 138 and 99. At that visit I told him to stay off of metformin. I reviewed the fact that he had experienced further weight loss with the hospitalization and surgery--I'm sure this is contributing to his decreased sugar.  AT OV 12/2014: He states that he checks his blood sugar about once a week. Sometimes fasting or sometimes during the day.  Says this morning fasting reading was 130. Says that he usually gets around 100 to 130 for fasting readings.  States that he takes his dog for a walk every morning. They usually go around the block and then back around. Says that he feels pretty good with this exertion.  He is taking medications as directed with no adverse effects. No lightheadedness. No presyncope.  His last office visit with me prior to the hospitlaization was 02/22/2014. The following is copied from that office visit note.:  At his routine OV 09/28/2013--at that visit I noticed that he looked even thinner that day than usual. I then noticed that I had noted at his prior visit with me 06/29/13 that he had also lost weight at the time of that visit. At that visit , I initially assumed that his weight loss was intentional  secondary to dietary changes to help control his  diabetes.  However, at that visit 06/29/13 he reported that he decrease his dietary intake secondary to finances. At that visit he stated   "I just have to eat what I have.   I can't afford anything more."  Weight February 2014   was 167. Weight 06/29/13             was 155. Weight 09/28/13               was 147.  09/28/13---checked labs to evaluate underlying medical causes of weight loss including CBC TSH and CME T. These were all normal.  We also discussed the fact he had a screening colonoscopy 06/03/2009 and he was told to repeat 10-15 years.  He did come in for weight check and his weight was stable at that time. Say when he came in for that we checked our staff gave him some contact information --he could call for some assistance.  At [place] 09/28/13 he says that he is using [employer] Says that they get him through to every other Thursday. He gives very pick this up. Says that he has other phone numbers that he could call for food if he needs to but hasn't needed to so far.  He had colonoscopy by Dr. Jearld Adjutant on 12/12/13. There were polyps which were biopsied. External and internal hemorrhoids. At that time Dr. Wonda Amis planned to order CT of abdomen and pelvis as well as chest.  He had  a CT scan on 12/09/13. No malignancy was seen on either CT scan. No significant finding seen on the CT scans.  Today he says that he continues to  get food once a month from 4 or 5 different places--- [employer] out reach, [employer], [employer], their church.  Weight 12/29/14 remains stable at 146.  At visit 12/29/14 I also reviewed last cardiology note.  Saw NP at Cardiology 11/22/14. Things were stable at that visit and he was to continue current medications. No labs or tests were indicated/ ordered at that time and they plan for follow-up 6 months.  04/04/2015:  He reports that sometimes when he first stands up, his left "hip" "is stiff--takes a minute to straighten up". Says there is no pain,  just stiffness.  Does not want to pursue imaging, further eval right now sec to finances--will monitor. No other complaints or concerns. Has been feeling pretty good. Still checks blood sugar once a week--fasting---gets 120s, 133, "something like that"  10/05/2015: He has no complaints during the visit. Reports he had his follow-up carotid artery ultrasound on Monday but has not heard results yet. He is taking cholesterol medication as directed. No myalgias or other adverse effects. He is taking blood pressure medications as directed with no adverse effects. When he gets up off of the exam table to go to the lab, he moves very slowly and stiff holding to his left hip. Asked what area was bothering him he points to the anterior aspect of the left hip.  10/24/2015: At [place] 10/05/2015--A1C came back high at 8.9. 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  When I got that lab result, told him to start Metformin 1,061m BID and check fasting BS QAM and come for f/u OV 2 weeks.  Today he states that he is taking the metformin 1000 mg twice a day as directed. Says it is causing him quite a bit of gas and some heartburn. Says that it's only causing a little bit of diarrhea. Says he takes a Tums and that helps the heartburn. Then adds that these symptoms were worse when he first started the Metformin and that the symptoms are getting better.  He does bring in blood sugar log sheet. Fasting morning readings have been 167, 129, 137, 125, 115, 138, 108, 136, 111, 109, 143, 145, 114, 130.  12/22/2015--- Phone message on this date that patient called reporting having diarrhea with the metformin 1000 twice a day. At time he was told to decrease down to 500 mg twice a day.  04/16/2016: Pt reports that he has decreased the metformin down to 500 mg once a day. Says he still has some diarrhea even with this low dose. He checks his blood sugar about twice a week. Says yesterday he got 158 and that was one or 2 hours  after he ate. No other complaints or concerns today. That he is feeling good. Taking blood pressure medication as directed with no lightheadedness or other adverse effects. Taking pravastatin with no myalgias or other adverse effects.   09/27/2016: Today he came in for office visit because "high blood sugar readings". This was not a routine f/u OV. (I dont know why he is overdue for ROV / why he hasnot come in for ROV prior to today) States that he has been getting readings like 350 and 320 and says that these are fasting morning readings. Says he has been getting these kinds of readings for about 2 months now.  I  reviewed his Lab from 04/2016. At that time A1c was performed. At that time I had said to add Glucotrol.  Today I asked if he was taking that as directed. He says that caused diarrhea so he is not taking it. I reviewed that  the metformin caused diarrhea in the past but asked if he was confusing this with metformin and he says no- the Glucotrol also cause diarrhea so he quit it. Now he still has some diarrhea but that it was worse with the Glucotrol.   Past Medical History:  Diagnosis Date  . Aortic stenosis, mild   . BPH (benign prostatic hypertrophy)   . CAD (coronary artery disease)   . 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 Constipation   . Diabetes mellitus   . GERD (gastroesophageal reflux disease)   . History of cataract   . Hyperlipidemia   . Hypertension   . Nephrolithiasis   . Vitamin D deficiency      Home Meds: Outpatient Medications Prior to Visit  Medication Sig Dispense Refill  . aspirin 81 MG EC tablet Take 1 tablet (81 mg total) by mouth daily. Swallow whole. 30 tablet 12  . Blood Glucose Monitoring Suppl (ONE TOUCH ULTRA SYSTEM KIT) w/Device KIT Check blood sugar twice daily. 1 each 0  . Cholecalciferol (VITAMIN D) 2000 UNITS tablet Take 2,000 Units by mouth daily.    . fish oil-omega-3 fatty acids 1000 MG capsule Take 1 g by mouth daily.       Marland Kitchen glipiZIDE (GLUCOTROL) 10 MG tablet TAKE 1 TABLET BY MOUTH  DAILY 90 tablet 0  . glucose blood test strip Check blood sugar (2) twice daily. 100 each 12  . Lancets (ONETOUCH ULTRASOFT) lancets Use as instructed 100 each 12  . lisinopril (PRINIVIL,ZESTRIL) 2.5 MG tablet TAKE 1 TABLET (2.5 MG TOTAL) BY MOUTH DAILY. 90 tablet 3  . metoprolol succinate (TOPROL-XL) 50 MG 24 hr tablet TAKE 1 TABLET BY MOUTH  DAILY WITH OR IMMEDIATELY  FOLLOWING A MEAL 90 tablet 3  . omeprazole (PRILOSEC) 20 MG capsule TAKE 1 CAPSULE BY MOUTH  DAILY 90 capsule 3  . pravastatin (PRAVACHOL) 40 MG tablet Take 1 tablet by mouth  daily 90 tablet 3  . alendronate (FOSAMAX) 70 MG tablet Take 1 tablet (70 mg total) by mouth every 7 (seven) days. Take with a full glass of water on an empty stomach. (Patient not taking: Reported on 04/16/2016) 12 tablet 3   No facility-administered medications prior to visit.      Allergies:  Allergies  Allergen Reactions  . Actos [Pioglitazone] Other (See Comments)    Cramps in legs   . Metformin And Related Diarrhea  . Morphine     REACTION: hives  . Statins     REACTION: leg cramps but tolerates pravastatin    Social History   Social History  . Marital status: Married    Spouse name: N/A  . Number of children: N/A  . Years of education: N/A   Occupational History  . Not on file.   Social History Main Topics  . Smoking status: Former Smoker    Packs/day: 1.00    Years: 30.00    Quit date: 06/10/1999  . Smokeless tobacco: Former Systems developer    Types: [place] date: 04/09/2014     Comment: Quit >15 years ago  . Alcohol use No  . Drug use: No  . Sexual activity: Not Currently  Other Topics Concern  . Not on file   Social History Narrative   Married for >44 years    Family History  Problem Relation Age of Onset  . Heart disease Mother   . Congestive Heart Failure Father   . Heart failure Sister 52     Review of Systems:  See HPI for pertinent ROS. All other  ROS negative.    Physical Exam: Blood pressure 110/70, pulse 77, temperature 97.7 F (36.5 C), temperature source Oral, resp. rate 18, weight 146 lb (66.2 kg), SpO2 97 %., Body mass index is 22.2 kg/m. General: Thin WM. Appears in no acute distress. Neck: Bilateral Carotid Bruits.  Lungs: Crackles at bases bilaterally. O/w clear.  Heart: RRR with S1 S2. I/VI murmur at 2nd ICS on Right Musculoskeletal:  Strength and tone normal for age. Extremities/Skin: Warm and dry.  No edema.  Forearms with multiple scattered areas of deep reddish-purplish purpura.  Neuro: Alert and oriented X 3. Moves all extremities spontaneously. Gait is normal. CNII-XII grossly in tact. Psych:  Responds to questions appropriately with a normal affect. Diabetic Foot Exam: Inspection: Normal. No wounds, no callouses, worisome areas. Sensation intact. 1+-2+ DP pulses bilaterally. Trace PT pulses bilaterally.      ASSESSMENT AND PLAN:  76 y.o. year old male with   1. Type 2 diabetes mellitus without complication, without long-term current use of insulin (HCC) At this time Blood Sugar reading 236 and A1c 11.0 He cannot take Metformin, Actos, Glucotrol. Is not going to be able to afford multiple medications. At this time will add basal insulin. Staff has shown him how to do this and he has administered first dose here today. He is given 10 units. To then titrate up 1 unit each day until he gets to 16 units. He will then stop/ pause at 16 units. Given him a blood sugar log sheet and he is to document fasting morning readings. Is scheduling follow-up visit with me for this upcoming Monday. Bring the log sheet with him to that appointment. - Hemoglobin A1C, fingerstick - Glucose, fingerstick (stat) - Insulin Glargine (LANTUS SOLOSTAR) 100 UNIT/ML Solostar Pen; Give # units as directed at office visit, up to 30 units daily.  Dispense: 5 pen; Refill: PRN   THE FOLLOWING WAS NOT COVERED AT OV 09/27/2016--THE FOLLOWING IS  COPIED FROM PRIOR OV:   Subclinical hypothyroidism - TSH - T4, free  Osteopenia determined by x-ray Prior X-ray showed osteopenia. Patient has never had bone density scan. Discussed having bone density scan and he is agreeable. - DG Bone Density; Future Addendum Added 11/14/15--- DEXA shows Osteoporosis.  Pt to start Fosamax 61m once weekly.  Osteoporosis --S/P DEXA 11/2015 --Started Fosamax 11/14/2015  Left hip pain --will obtain XRay  S/P AVR (aortic valve replacement) --Managed by Cardiology  Cardiomyopathy, dilated, nonischemic --Managed by Cardiology  Carotid artery stenosis, bilateral In the past, cardiology was ordering his carotid artery ultrasounds. However, patient had stopped following up with cardiology as he could not afford to pay the co-pay. Because of this financial situation, I  Ordered Echo and carotid artery Dopplers more recently.  And I ordered another set of carotid Dopplers which was performed on 10/01/14. Stable. 60-79% bilateral ICA stenosis. Follow-up one year. --F/U performed 09/2015:  Stable. 60-79% bilateral ICA stenosis. Follow-up one year.  Bilateral carotid bruits - see #3 above  Type 2 diabetes mellitus with hyperglycemia - Hemoglobin A1c  Last microalbumin: 02/2014, 04/04/2015   He provides excellent foot care. Reminded him  of proper foot care and wearing shoes at all times. Diabetic Foot Exam Documented in Quality Metrics 04/04/2015   He is overdue for ophthalmology exam. He states that he cannot afford this. He has a high co-pay.   He is on ASA 60m, on ACE inhibitor, and on statin therapy.  He has been off Metformin since hospitalization--see HPI. At 04/04/2015 OV---Told him that since BS has been so well controlled, can change to Routine OVs Q 6 months, instead of every 3 months. 10/05/2015---A1C was up to 8.9----Restarted Metformin but has had problems with diarrhea---see HPI from 04/16/2016 OV for details.  AT [place] 04/16/2016--- is  having diarrhea even with metformin 500 mg daily. Therefore I'm going to have to discontinue the metformin. I will wait to get the A1c result before I decide dose of sulfonylurea. Cannot use Actos given his cardiac history but he is not going to be able to afford any brand name medication.  Hyperlipidemia On pravastatin. Check FLP LFT now. He is fasting.  Essential hypertension Blood pressure is at goal. Continue current medications. On ACE inhibitor and beta blocker. Check lab to monitor.  Weight Loss Has had evaluation by myself as well as Dr. RWonda Amis Also, [facility]---Realized that he has no lower teeth and that those dentures have been broken for long time and he cannot afford new ones--he says he has to eat soft foods.  Weight is stable.  CONSTIPATION  In past was treating with Linzess, Mirilax Not a current problem.   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 Also he is on aspirin 81 mg daily and this is also contributing to this.  BENIGN PROSTATIC HYPERTROPHY, WITH OBSTRUCTION  A screening PSA was done 09/25/12 and was normal. He is now 76years old so can stop doing screening PSA.   Screening colonoscopy: 06/03/2009. Repeat 10-15 years. Had Colonoscopy --Dr. Rehman--11/26/13--Polyps. Internal and External Hemorrhoids.   Immunizations:  Pneumonia vaccine: Patient reports that he had this at age [id number] prior PCP  .--He would have received  Pneumovax 23.   Prevnar  13 given here 09/02/14.  Tetanus vaccine: Patient recently checked with his insurance and is going to cost him $40 and he deferred.  Zostavax: Patient check with his insurance. Too expensive. The patient defers.  Influenza vaccine: Received this 04/20/14      F/U 6 months, sooner if needed.   Signed,     Signed, [id number] Temple CircleDSan Lorenzo PUtah BBaylor [facility]2/22/2018 1:04 PM

## 2016-10-01 ENCOUNTER — Ambulatory Visit (INDEPENDENT_AMBULATORY_CARE_PROVIDER_SITE_OTHER): Payer: Medicare Other | Admitting: Physician Assistant

## 2016-10-01 ENCOUNTER — Encounter: Payer: Self-pay | Admitting: Physician Assistant

## 2016-10-01 VITALS — BP 118/62 | HR 77 | Temp 97.8°F | Resp 18 | Wt 147.2 lb

## 2016-10-01 DIAGNOSIS — Z794 Long term (current) use of insulin: Secondary | ICD-10-CM

## 2016-10-01 DIAGNOSIS — E119 Type 2 diabetes mellitus without complications: Secondary | ICD-10-CM

## 2016-10-01 MED ORDER — SITAGLIPTIN PHOSPHATE 100 MG PO TABS: 100.0000 mg | ORAL_TABLET | Freq: Every day | ORAL | 3 refills | Status: DC

## 2016-10-01 NOTE — Progress Notes (Signed)
Patient ID: [name] MRN: [id number], DOB: 1940/10/06, 76 y.o. Date of Encounter: _0 @  Chief Complaint:  Chief Complaint  Patient presents with  . Diabetes    f/u    HPI: 76 y.o. year old white male  presents today for routine followup office visit.   On 04/13/2014 he had aortic valve replacement secondary to severe aortic stenosis.  Had Normal coronaries by cardiac catheterization. Preoperatively he had low EF.  At his office visit with me 05/26/14, pt stated that they did give him metformin in the hospital, but said that he had not been taking it since he got home because his blood sugars were low.  He stated that even off of Metformin, his fasting morning blood sugars had been around 138 and 99. At that visit I told him to stay off of metformin. I reviewed the fact that he had experienced further weight loss with the hospitalization and surgery--I'm sure this is contributing to his decreased sugar.  AT OV 12/2014: He states that he checks his blood sugar about once a week. Sometimes fasting or sometimes during the day.  Says this morning fasting reading was 130. Says that he usually gets around 100 to 130 for fasting readings.  States that he takes his dog for a walk every morning. They usually go around the block and then back around. Says that he feels pretty good with this exertion.  He is taking medications as directed with no adverse effects. No lightheadedness. No presyncope.  His last office visit with me prior to the hospitlaization was 02/22/2014. The following is copied from that office visit note.:  At his routine OV 09/28/2013--at that visit I noticed that he looked even thinner that day than usual. I then noticed that I had noted at his prior visit with me 06/29/13 that he had also lost weight at the time of that visit. At that visit , I initially assumed that his weight loss was intentional  secondary to dietary changes to help control his diabetes.    However, at that visit 06/29/13 he reported that he decrease his dietary intake secondary to finances. At that visit he stated   "I just have to eat what I have.   I can't afford anything more."  Weight February 2014   was 167. Weight 06/29/13             was 155. Weight 09/28/13               was 147.  09/28/13---checked labs to evaluate underlying medical causes of weight loss including CBC TSH and CME T. These were all normal.  We also discussed the fact he had a screening colonoscopy 06/03/2009 and he was told to repeat 10-15 years.  He did come in for weight check and his weight was stable at that time. Say when he came in for that we checked our staff gave him some contact information --he could call for some assistance.  At [place] 09/28/13 he says that he is using [employer] Says that they get him through to every other Thursday. He gives very pick this up. Says that he has other phone numbers that he could call for food if he needs to but hasn't needed to so far.  He had colonoscopy by Dr. Jearld Adjutant on 12/12/13. There were polyps which were biopsied. External and internal hemorrhoids. At that time Dr. Wonda Amis planned to order CT of abdomen and pelvis as well as chest.  He had a CT scan on 12/09/13. No malignancy was seen on either CT scan. No significant finding seen on the CT scans.  Today he says that he continues to  get food once a month from 4 or 5 different places--- [employer] out reach, [employer], [employer], their church.  Weight 12/29/14 remains stable at 146.  At visit 12/29/14 I also reviewed last cardiology note.  Saw NP at Cardiology 11/22/14. Things were stable at that visit and he was to continue current medications. No labs or tests were indicated/ ordered at that time and they plan for follow-up 6 months.  04/04/2015:  He reports that sometimes when he first stands up, his left "hip" "is stiff--takes a minute to straighten up". Says there is no pain, just  stiffness.  Does not want to pursue imaging, further eval right now sec to finances--will monitor. No other complaints or concerns. Has been feeling pretty good. Still checks blood sugar once a week--fasting---gets 120s, 133, "something like that"  10/05/2015: He has no complaints during the visit. Reports he had his follow-up carotid artery ultrasound on Monday but has not heard results yet. He is taking cholesterol medication as directed. No myalgias or other adverse effects. He is taking blood pressure medications as directed with no adverse effects. When he gets up off of the exam table to go to the lab, he moves very slowly and stiff holding to his left hip. Asked what area was bothering him he points to the anterior aspect of the left hip.  10/24/2015: At [place] 10/05/2015--A1C came back high at 8.9. 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  When I got that lab result, told him to start Metformin 1,057m BID and check fasting BS QAM and come for f/u OV 2 weeks.  Today he states that he is taking the metformin 1000 mg twice a day as directed. Says it is causing him quite a bit of gas and some heartburn. Says that it's only causing a little bit of diarrhea. Says he takes a Tums and that helps the heartburn. Then adds that these symptoms were worse when he first started the Metformin and that the symptoms are getting better.  He does bring in blood sugar log sheet. Fasting morning readings have been 167, 129, 137, 125, 115, 138, 108, 136, 111, 109, 143, 145, 114, 130.  12/22/2015--- Phone message on this date that patient called reporting having diarrhea with the metformin 1000 twice a day. At time he was told to decrease down to 500 mg twice a day.  04/16/2016: Pt reports that he has decreased the metformin down to 500 mg once a day. Says he still has some diarrhea even with this low dose. He checks his blood sugar about twice a week. Says yesterday he got 158 and that was one or 2 hours after he  ate. No other complaints or concerns today. That he is feeling good. Taking blood pressure medication as directed with no lightheadedness or other adverse effects. Taking pravastatin with no myalgias or other adverse effects.   09/27/2016: Today he came in for office visit because "high blood sugar readings". This was not a routine f/u OV. (I dont know why he is overdue for ROV / why he hasnot come in for ROV prior to today) States that he has been getting readings like 350 and 320 and says that these are fasting morning readings. Says he has been getting these kinds of readings for about 2 months now.  I reviewed his Lab from 04/2016. At that time A1c was performed. At that time I had said to add Glucotrol.  Today I asked if he was taking that as directed. He says that caused diarrhea so he is not taking it. I reviewed that  the metformin caused diarrhea in the past but asked if he was confusing this with metformin and he says no- the Glucotrol also cause diarrhea so he quit it. Now he still has some diarrhea but that it was worse with the Glucotrol. ASSESSMENT/PLAN AT THAT OV: 1. Type 2 diabetes mellitus without complication, without long-term current use of insulin (HCC) At this time Blood Sugar reading 236 and A1c 11.0 He cannot take Metformin, Actos, Glucotrol. Is not going to be able to afford multiple medications. At this time will add basal insulin. Staff has shown him how to do this and he has administered first dose here today. He is given 10 units. To then titrate up 1 unit each day until he gets to 16 units. He will then stop/ pause at 16 units. Given him a blood sugar log sheet and he is to document fasting morning readings. Is scheduling follow-up visit with me for this upcoming Monday. Bring the log sheet with him to that appointment. - Hemoglobin A1C, fingerstick - Glucose, fingerstick (stat) - Insulin Glargine (LANTUS SOLOSTAR) 100 UNIT/ML Solostar Pen; Give # units as directed at  office visit, up to 30 units daily.  Dispense: 5 pen; Refill: PRN   10/01/2016: Today he does bring in his blood sugar log sheet that we gave him at last office visit. He does have documented that he has been increasing number of units of insulin by 1 each day since that visit. Also has documented that his fasting blood sugars have been (in this chronological order according to the date): 285, 200, 267, 198. Today also reviewed the fact that he is unable to take Actos because of his cardiac issues. Can not take metformin or glipizide because each of these causes diarrhea. Verified with him again today that his diarrhea does get better when he stops the glipizide. Says that his Insulin is going to cost > $200  Past Medical History:  Diagnosis Date  . Aortic stenosis, mild   . BPH (benign prostatic hypertrophy)   . CAD (coronary artery disease)   . 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 Constipation   . Diabetes mellitus   . GERD (gastroesophageal reflux disease)   . History of cataract   . Hyperlipidemia   . Hypertension   . Nephrolithiasis   . Vitamin D deficiency      Home Meds: Outpatient Medications Prior to Visit  Medication Sig Dispense Refill  . aspirin 81 MG EC tablet Take 1 tablet (81 mg total) by mouth daily. Swallow whole. 30 tablet 12  . Blood Glucose Monitoring Suppl (ONE TOUCH ULTRA SYSTEM KIT) w/Device KIT Check blood sugar twice daily. 1 each 0  . Cholecalciferol (VITAMIN D) 2000 UNITS tablet Take 2,000 Units by mouth daily.    . fish oil-omega-3 fatty acids 1000 MG capsule Take 1 g by mouth daily.     Marland Kitchen glipiZIDE (GLUCOTROL) 10 MG tablet TAKE 1 TABLET BY MOUTH  DAILY 90 tablet 0  . glucose blood test strip Check blood sugar (2) twice daily. 100 each 12  . Insulin Glargine (LANTUS SOLOSTAR) 100 UNIT/ML Solostar Pen Give # units as directed at office visit, up to 30 units  daily. 5 pen PRN  . Lancets (ONETOUCH ULTRASOFT) lancets Use as instructed 100 each  12  . lisinopril (PRINIVIL,ZESTRIL) 2.5 MG tablet TAKE 1 TABLET (2.5 MG TOTAL) BY MOUTH DAILY. 90 tablet 3  . metoprolol succinate (TOPROL-XL) 50 MG 24 hr tablet TAKE 1 TABLET BY MOUTH  DAILY WITH OR IMMEDIATELY  FOLLOWING A MEAL 90 tablet 3  . omeprazole (PRILOSEC) 20 MG capsule TAKE 1 CAPSULE BY MOUTH  DAILY 90 capsule 3  . pravastatin (PRAVACHOL) 40 MG tablet Take 1 tablet by mouth  daily 90 tablet 3  . alendronate (FOSAMAX) 70 MG tablet Take 1 tablet (70 mg total) by mouth every 7 (seven) days. Take with a full glass of water on an empty stomach. (Patient not taking: Reported on 04/16/2016) 12 tablet 3   No facility-administered medications prior to visit.      Allergies:  Allergies  Allergen Reactions  . Actos [Pioglitazone] Other (See Comments)    Cramps in legs   . Metformin And Related Diarrhea  . Morphine     REACTION: hives  . Statins     REACTION: leg cramps but tolerates pravastatin    Social History   Social History  . Marital status: Married    Spouse name: N/A  . Number of children: N/A  . Years of education: N/A   Occupational History  . Not on file.   Social History Main Topics  . Smoking status: Former Smoker    Packs/day: 1.00    Years: 30.00    Quit date: 06/10/1999  . Smokeless tobacco: Former Systems developer    Types: [place] date: 04/09/2014     Comment: Quit >15 years ago  . Alcohol use No  . Drug use: No  . Sexual activity: Not Currently   Other Topics Concern  . Not on file   Social History Narrative   Married for >44 years    Family History  Problem Relation Age of Onset  . Heart disease Mother   . Congestive Heart Failure Father   . Heart failure Sister 62     Review of Systems:  See HPI for pertinent ROS. All other ROS negative.    Physical Exam: Blood pressure 118/62, pulse 77, temperature 97.8 F (36.6 C), temperature source Oral, resp. rate 18, weight 147 lb 3.2 oz (66.8 kg), SpO2 98 %., Body mass index is 22.38 kg/m. General:  Thin WM. Appears in no acute distress. Neck: Bilateral Carotid Bruits.  Lungs: Crackles at bases bilaterally. O/w clear.  Heart: RRR with S1 S2. I/VI murmur at 2nd ICS on Right Musculoskeletal:  Strength and tone normal for age. Extremities/Skin: Warm and dry.  No edema.  Forearms with multiple scattered areas of deep reddish-purplish purpura.  Neuro: Alert and oriented X 3. Moves all extremities spontaneously. Gait is normal. CNII-XII grossly in tact. Psych:  Responds to questions appropriately with a normal affect. Diabetic Foot Exam: Inspection: Normal. No wounds, no callouses, worisome areas. Sensation intact. 1+-2+ DP pulses bilaterally. Trace PT pulses bilaterally.      ASSESSMENT AND PLAN:  76 y.o. year old male with   1. Type 2 diabetes mellitus without complication, without long-term current use of insulin ([place]) Today I had Maudie Mercury talk to him regarding finances and assistance that may be available to help with this as well as finding out which insulin will have the lowest co-pay with his insurance coverage. Today I have told him to continue increasing the insulin by 1 unit  per day until he gets fasting sugars reading around 150. When sugar reading around 150 then he will pause and continue that number of units. At this time will add Januvia 100 mg once daily. Renal function was normal on last lab. He will continue to document fasting sugars every morning. He will have follow-up visit with me in one week.      THE FOLLOWING WAS NOT COVERED AT OV 09/27/2016 or at OV 10/01/2016--THE FOLLOWING IS COPIED FROM PRIOR OV:   Subclinical hypothyroidism - TSH - T4, free  Osteopenia determined by x-ray Prior X-ray showed osteopenia. Patient has never had bone density scan. Discussed having bone density scan and he is agreeable. - DG Bone Density; Future Addendum Added 11/14/15--- DEXA shows Osteoporosis.  Pt to start Fosamax 83m once weekly.  Osteoporosis --S/P DEXA 11/2015 --Started  Fosamax 11/14/2015  Left hip pain --will obtain XRay  S/P AVR (aortic valve replacement) --Managed by Cardiology  Cardiomyopathy, dilated, nonischemic --Managed by Cardiology  Carotid artery stenosis, bilateral In the past, cardiology was ordering his carotid artery ultrasounds. However, patient had stopped following up with cardiology as he could not afford to pay the co-pay. Because of this financial situation, I  Ordered Echo and carotid artery Dopplers more recently.  And I ordered another set of carotid Dopplers which was performed on 10/01/14. Stable. 60-79% bilateral ICA stenosis. Follow-up one year. --F/U performed 09/2015:  Stable. 60-79% bilateral ICA stenosis. Follow-up one year.  Bilateral carotid bruits - see #3 above  Type 2 diabetes mellitus with hyperglycemia - Hemoglobin A1c  Last microalbumin: 02/2014, 04/04/2015   He provides excellent foot care. Reminded him of proper foot care and wearing shoes at all times. Diabetic Foot Exam Documented in [facility]rics 04/04/2015   He is overdue for ophthalmology exam. He states that he cannot afford this. He has a high co-pay.   He is on ASA 851m on ACE inhibitor, and on statin therapy.  He has been off Metformin since hospitalization--see HPI. At 04/04/2015 OV---Told him that since BS has been so well controlled, can change to Routine OVs Q 6 months, instead of every 3 months. 10/05/2015---A1C was up to 8.9----Restarted Metformin but has had problems with diarrhea---see HPI from 04/16/2016 OV for details.  AT OV 04/16/2016--- is having diarrhea even with metformin 500 mg daily. Therefore I'm going to have to discontinue the metformin. I will wait to get the A1c result before I decide dose of sulfonylurea. Cannot use Actos given his cardiac history but he is not going to be able to afford any brand name medication.  Hyperlipidemia On pravastatin. Check FLP LFT now. He is fasting.  Essential hypertension Blood pressure is  at goal. Continue current medications. On ACE inhibitor and beta blocker. Check lab to monitor.  Weight Loss Has had evaluation by myself as well as Dr. [place]onda [place]isAlso, [place]---Realized that he has no lower teeth and that those dentures have been broken for long time and he cannot afford new ones--he says he has to eat soft foods.  Weight is stable.  CONSTIPATION  In past was treating with Linzess, Mirilax Not a current problem.   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 Also he is on aspirin 81 mg daily and this is also contributing to this.  BENIGN PROSTATIC HYPERTROPHY, WITH OBSTRUCTION  A screening PSA was done 09/25/12 and was normal. He is now 89ears old so can stop doing  screening PSA.   Screening colonoscopy: 06/03/2009. Repeat 10-15 years. Had Colonoscopy --Dr. Rehman--11/26/13--Polyps. Internal and External Hemorrhoids.   Immunizations:  Pneumonia vaccine: Patient reports that he had this at age 33 his prior PCP  .--He would have received  Pneumovax 23.   Prevnar  13 given here 09/02/14.  Tetanus vaccine: Patient recently checked with his insurance and is going to cost him $40 and he deferred.  Zostavax: Patient check with his insurance. Too expensive. The patient defers.  Influenza vaccine: Received this 04/20/14      F/U 6 months, sooner if needed.   Signed,     Signed, [address] [place], [place], [facility] 10/01/2016 10:33 AM

## 2016-10-02 ENCOUNTER — Other Ambulatory Visit: Payer: Self-pay

## 2016-10-02 DIAGNOSIS — Z794 Long term (current) use of insulin: Principal | ICD-10-CM

## 2016-10-02 DIAGNOSIS — E119 Type 2 diabetes mellitus without complications: Secondary | ICD-10-CM

## 2016-10-04 ENCOUNTER — Other Ambulatory Visit: Payer: Self-pay

## 2016-10-04 MED ORDER — BASAGLAR KWIKPEN 100 UNIT/ML ~~LOC~~ SOPN: PEN_INJECTOR | SUBCUTANEOUS | 11 refills | Status: DC

## 2016-10-04 MED ORDER — SITAGLIPTIN PHOSPHATE 100 MG PO TABS: 100.0000 mg | ORAL_TABLET | Freq: Every day | ORAL | 3 refills | Status: DC

## 2016-10-08 ENCOUNTER — Encounter: Payer: Self-pay | Admitting: Physician Assistant

## 2016-10-08 ENCOUNTER — Ambulatory Visit (INDEPENDENT_AMBULATORY_CARE_PROVIDER_SITE_OTHER): Payer: Medicare Other | Admitting: Physician Assistant

## 2016-10-08 DIAGNOSIS — E119 Type 2 diabetes mellitus without complications: Secondary | ICD-10-CM

## 2016-10-08 DIAGNOSIS — Z794 Long term (current) use of insulin: Secondary | ICD-10-CM

## 2016-10-08 NOTE — Progress Notes (Addendum)
Patient ID: [name] MRN: [id number], DOB: July 21, 1941, 76 y.o. Date of Encounter: _0 @  Chief Complaint:  Chief Complaint  Patient presents with  . Diabetes    1wk f/u    HPI: 76 y.o. year old white male  presents today for routine followup office visit.   On 04/13/2014 he had aortic valve replacement secondary to severe aortic stenosis.  Had Normal coronaries by cardiac catheterization. Preoperatively he had low EF.  At his office visit with me 05/26/14, pt stated that they did give him metformin in the hospital, but said that he had not been taking it since he got home because his blood sugars were low.  He stated that even off of Metformin, his fasting morning blood sugars had been around 138 and 99. At that visit I told him to stay off of metformin. I reviewed the fact that he had experienced further weight loss with the hospitalization and surgery--I'm sure this is contributing to his decreased sugar.  AT OV 12/2014: He states that he checks his blood sugar about once a week. Sometimes fasting or sometimes during the day.  Says this morning fasting reading was 130. Says that he usually gets around 100 to 130 for fasting readings.  States that he takes his dog for a walk every morning. They usually go around the block and then back around. Says that he feels pretty good with this exertion.  He is taking medications as directed with no adverse effects. No lightheadedness. No presyncope.  His last office visit with me prior to the hospitlaization was 02/22/2014. The following is copied from that office visit note.:  At his routine OV 09/28/2013--at that visit I noticed that he looked even thinner that day than usual. I then noticed that I had noted at his prior visit with me 06/29/13 that he had also lost weight at the time of that visit. At that visit , I initially assumed that his weight loss was intentional  secondary to dietary changes to help control his diabetes.    However, at that visit 06/29/13 he reported that he decrease his dietary intake secondary to finances. At that visit he stated   "I just have to eat what I have.   I can't afford anything more."  Weight February 2014   was 167. Weight 06/29/13             was 155. Weight 09/28/13               was 147.  09/28/13---checked labs to evaluate underlying medical causes of weight loss including CBC TSH and CME T. These were all normal.  We also discussed the fact he had a screening colonoscopy 06/03/2009 and he was told to repeat 10-15 years.  He did come in for weight check and his weight was stable at that time. Say when he came in for that we checked our staff gave him some contact information --he could call for some assistance.  At [place] 09/28/13 he says that he is using [employer] Says that they get him through to every other Thursday. He gives very pick this up. Says that he has other phone numbers that he could call for food if he needs to but hasn't needed to so far.  He had colonoscopy by Dr. Jearld Adjutant on 12/12/13. There were polyps which were biopsied. External and internal hemorrhoids. At that time Dr. Wonda Amis planned to order CT of abdomen and pelvis as well as chest.  He had a CT scan on 12/09/13. No malignancy was seen on either CT scan. No significant finding seen on the CT scans.  Today he says that he continues to  get food once a month from 4 or 5 different places--- [employer] out reach, [employer], [employer], their church.  Weight 12/29/14 remains stable at 146.  At visit 12/29/14 I also reviewed last cardiology note.  Saw NP at Cardiology 11/22/14. Things were stable at that visit and he was to continue current medications. No labs or tests were indicated/ ordered at that time and they plan for follow-up 6 months.  04/04/2015:  He reports that sometimes when he first stands up, his left "hip" "is stiff--takes a minute to straighten up". Says there is no pain, just  stiffness.  Does not want to pursue imaging, further eval right now sec to finances--will monitor. No other complaints or concerns. Has been feeling pretty good. Still checks blood sugar once a week--fasting---gets 120s, 133, "something like that"  10/05/2015: He has no complaints during the visit. Reports he had his follow-up carotid artery ultrasound on Monday but has not heard results yet. He is taking cholesterol medication as directed. No myalgias or other adverse effects. He is taking blood pressure medications as directed with no adverse effects. When he gets up off of the exam table to go to the lab, he moves very slowly and stiff holding to his left hip. Asked what area was bothering him he points to the anterior aspect of the left hip.  10/24/2015: At [place] 10/05/2015--A1C came back high at 8.9. 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  When I got that lab result, told him to start Metformin 1,023m BID and check fasting BS QAM and come for f/u OV 2 weeks.  Today he states that he is taking the metformin 1000 mg twice a day as directed. Says it is causing him quite a bit of gas and some heartburn. Says that it's only causing a little bit of diarrhea. Says he takes a Tums and that helps the heartburn. Then adds that these symptoms were worse when he first started the Metformin and that the symptoms are getting better.  He does bring in blood sugar log sheet. Fasting morning readings have been 167, 129, 137, 125, 115, 138, 108, 136, 111, 109, 143, 145, 114, 130.  12/22/2015--- Phone message on this date that patient called reporting having diarrhea with the metformin 1000 twice a day. At time he was told to decrease down to 500 mg twice a day.  04/16/2016: Pt reports that he has decreased the metformin down to 500 mg once a day. Says he still has some diarrhea even with this low dose. He checks his blood sugar about twice a week. Says yesterday he got 158 and that was one or 2 hours after he  ate. No other complaints or concerns today. That he is feeling good. Taking blood pressure medication as directed with no lightheadedness or other adverse effects. Taking pravastatin with no myalgias or other adverse effects.   09/27/2016: Today he came in for office visit because "high blood sugar readings". This was not a routine f/u OV. (I dont know why he is overdue for ROV / why he hasnot come in for ROV prior to today) States that he has been getting readings like 350 and 320 and says that these are fasting morning readings. Says he has been getting these kinds of readings for about 2 months now.  I reviewed his Lab from 04/2016. At that time A1c was performed. At that time I had said to add Glucotrol.  Today I asked if he was taking that as directed. He says that caused diarrhea so he is not taking it. I reviewed that  the metformin caused diarrhea in the past but asked if he was confusing this with metformin and he says no- the Glucotrol also cause diarrhea so he quit it. Now he still has some diarrhea but that it was worse with the Glucotrol. ASSESSMENT/PLAN AT THAT OV: 1. Type 2 diabetes mellitus without complication, without long-term current use of insulin (HCC) At this time Blood Sugar reading 236 and A1c 11.0 He cannot take Metformin, Actos, Glucotrol. Is not going to be able to afford multiple medications. At this time will add basal insulin. Staff has shown him how to do this and he has administered first dose here today. He is given 10 units. To then titrate up 1 unit each day until he gets to 16 units. He will then stop/ pause at 16 units. Given him a blood sugar log sheet and he is to document fasting morning readings. Is scheduling follow-up visit with me for this upcoming Monday. Bring the log sheet with him to that appointment. - Hemoglobin A1C, fingerstick - Glucose, fingerstick (stat) - Insulin Glargine (LANTUS SOLOSTAR) 100 UNIT/ML Solostar Pen; Give # units as directed at  office visit, up to 30 units daily.  Dispense: 5 pen; Refill: PRN   10/01/2016: Today he does bring in his blood sugar log sheet that we gave him at last office visit. He does have documented that he has been increasing number of units of insulin by 1 each day since that visit. Also has documented that his fasting blood sugars have been (in this chronological order according to the date): 285, 200, 267, 198. Today also reviewed the fact that he is unable to take Actos because of his cardiac issues. Can not take metformin or glipizide because each of these causes diarrhea. Verified with him again today that his diarrhea does get better when he stops the glipizide. Says that his Insulin is going to cost > $200  Assessment/Plan at that OV: 1. Type 2 diabetes mellitus without complication, without long-term current use of insulin ([place]) Today I had Maudie Mercury talk to him regarding finances and assistance that may be available to help with this as well as finding out which insulin will have the lowest co-pay with his insurance coverage. Today I have told him to continue increasing the insulin by 1 unit per day until he gets fasting sugars reading around 150. When sugar reading around 150 then he will pause and continue that number of units. At this time will add Januvia 100 mg once daily. Renal function was normal on last lab. He will continue to document fasting sugars every morning. He will have follow-up visit with me in one week.   10/08/2016: He states that he is taking the Januvia 100 mg daily. He got it at the local pharmacy and is waiting for the mail order to come in. He will pay $125 for 90 day supply. Currently using the sample of the Basaglar that we gave him at last visit. Says that he has sent in for the mail order and that his wife said it supposed to be free. Has no questions or concerns today. Having no adverse effects with the medicines.  Does bring in his blood sugar log form. Has been  increasing by 1 unit each day. The last 6 fasting blood sugar readings have been 240, 182, 158, 176, 276, 173. He is currently at 17 units daily.  Addendum added 10/10/2016:  Patient's wife her for OV with me. Reports that Januvia is causing husband to have diarrhea. I checked for samples of the 49m dose so he could try this dose but we have no Januvia 573msamples. Therefore told her to have him just stop the Januvia until his follow-up visit which she says is scheduled in just a couple weeks.  Past Medical History:  Diagnosis Date  . Aortic stenosis, mild   . BPH (benign prostatic hypertrophy)   . CAD (coronary artery disease)   . 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 Constipation   . Diabetes mellitus   . GERD (gastroesophageal reflux disease)   . History of cataract   . Hyperlipidemia   . Hypertension   . Nephrolithiasis   . Vitamin D deficiency      Home Meds: Outpatient Medications Prior to Visit  Medication Sig Dispense Refill  . alendronate (FOSAMAX) 70 MG tablet Take 1 tablet (70 mg total) by mouth every 7 (seven) days. Take with a full glass of water on an empty stomach. 12 tablet 3  . aspirin 81 MG EC tablet Take 1 tablet (81 mg total) by mouth daily. Swallow whole. 30 tablet 12  . Blood Glucose Monitoring Suppl (ONE TOUCH ULTRA SYSTEM KIT) w/Device KIT Check blood sugar twice daily. 1 each 0  . Cholecalciferol (VITAMIN D) 2000 UNITS tablet Take 2,000 Units by mouth daily.    . fish oil-omega-3 fatty acids 1000 MG capsule Take 1 g by mouth daily.     . Marland Kitchenlucose blood test strip Check blood sugar (2) twice daily. 100 each 12  . Insulin Glargine (BASAGLAR KWIKPEN) 100 UNIT/ML SOPN Give number of units as directed at office visit, up to 30 units daily. 15 mL 11  . Insulin Glargine (LANTUS SOLOSTAR) 100 UNIT/ML Solostar Pen Give # units as directed at office visit, up to 30 units daily. 5 pen PRN  . Lancets (ONETOUCH ULTRASOFT) lancets Use as instructed 100 each 12    . lisinopril (PRINIVIL,ZESTRIL) 2.5 MG tablet TAKE 1 TABLET (2.5 MG TOTAL) BY MOUTH DAILY. 90 tablet 3  . metoprolol succinate (TOPROL-XL) 50 MG 24 hr tablet TAKE 1 TABLET BY MOUTH  DAILY WITH OR IMMEDIATELY  FOLLOWING A MEAL 90 tablet 3  . omeprazole (PRILOSEC) 20 MG capsule TAKE 1 CAPSULE BY MOUTH  DAILY 90 capsule 3  . pravastatin (PRAVACHOL) 40 MG tablet Take 1 tablet by mouth  daily 90 tablet 3  . sitaGLIPtin (JANUVIA) 100 MG tablet Take 1 tablet (100 mg total) by mouth daily. 30 tablet 3   No facility-administered medications prior to visit.      Allergies:  Allergies  Allergen Reactions  . Actos [Pioglitazone] Other (See Comments)    Cramps in legs   . Metformin And Related Diarrhea  . Morphine     REACTION: hives  . Statins     REACTION: leg cramps but tolerates pravastatin    Social History   Social History  . Marital status: Married    Spouse name: N/A  . Number of children: N/A  . Years of education: N/A   Occupational History  . Not on file.   Social History Main Topics  . Smoking status: Former Smoker    Packs/day: 1.00  Years: 30.00    Quit date: 06/10/1999  . Smokeless tobacco: Former Systems developer    Types: [place] date: 04/09/2014     Comment: Quit >15 years ago  . Alcohol use No  . Drug use: No  . Sexual activity: Not Currently   Other Topics Concern  . Not on file   Social History Narrative   Married for >44 years    Family History  Problem Relation Age of Onset  . Heart disease Mother   . Congestive Heart Failure Father   . Heart failure Sister 60     Review of Systems:  See HPI for pertinent ROS. All other ROS negative.    Physical Exam: Blood pressure 130/60, pulse 74, temperature 97.5 F (36.4 C), temperature source Oral, resp. rate 16, weight 150 lb (68 kg), SpO2 98 %., Body mass index is 22.81 kg/m. General: Thin WM. Appears in no acute distress. Neck: Bilateral Carotid Bruits.  Lungs: Crackles at bases bilaterally. O/w  clear.  Heart: RRR with S1 S2. I/VI murmur at 2nd ICS on Right Musculoskeletal:  Strength and tone normal for age. Extremities/Skin: Warm and dry.  No edema.  Forearms with multiple scattered areas of deep reddish-purplish purpura.  Neuro: Alert and oriented X 3. Moves all extremities spontaneously. Gait is normal. CNII-XII grossly in tact. Psych:  Responds to questions appropriately with a normal affect. Diabetic Foot Exam: Inspection: Normal. No wounds, no callouses, worisome areas. Sensation intact. 1+-2+ DP pulses bilaterally. Trace PT pulses bilaterally.      ASSESSMENT AND PLAN:  76 y.o. year old male with   1. Type 2 diabetes mellitus without complication, without long-term current use of insulin (HCC) Today I gave him 4 weeks worth of samples of the Januvia 100 mg daily. Today I gave him samples of 2 more of the [employer] I have written # units daily on his BS log sheet---He is to continue increasing by 1 unit daily until he gets to 20 units and then we will continue 20 units daily. Will continue on the 20 units daily until he has follow-up visit in 1-2 weeks. Bring the blood sugar log form with him to that visit. F/U sooner if needed if any questions or concerns.   Addendum added 10/10/2016:  Patient's wife her for OV with me. Reports that Januvia is causing husband to have diarrhea. I checked for samples of the 37m dose so he could try this dose but we have no Januvia 564msamples. Therefore told her to have him just stop the Januvia until his follow-up visit which she says is scheduled in just a couple weeks.    THE FOLLOWING WAS NOT COVERED AT OV 09/27/2016 or at [facility]/2018 or at [facility]--THE FOLLOWING IS COPIED FROM PRIOR OV:   Subclinical hypothyroidism - TSH - T4, free  Osteopenia determined by x-ray Prior X-ray showed osteopenia. Patient has never had bone density scan. Discussed having bone density scan and he is agreeable. - DG Bone Density; Future Addendum  Added 11/14/15--- DEXA shows Osteoporosis.  Pt to start Fosamax 705mnce weekly.  Osteoporosis --S/P DEXA 11/2015 --Started Fosamax 11/14/2015  Left hip pain --will obtain XRay  S/P AVR (aortic valve replacement) --Managed by Cardiology  Cardiomyopathy, dilated, nonischemic --Managed by Cardiology  Carotid artery stenosis, bilateral In the past, cardiology was ordering his carotid artery ultrasounds. However, patient had stopped following up with cardiology as he could not afford to pay the co-pay. Because of this financial  situation, I  Ordered Echo and carotid artery Dopplers more recently.  And I ordered another set of carotid Dopplers which was performed on 10/01/14. Stable. 60-79% bilateral ICA stenosis. Follow-up one year. --F/U performed 09/2015:  Stable. 60-79% bilateral ICA stenosis. Follow-up one year.  Bilateral carotid bruits - see #3 above  Type 2 diabetes mellitus with hyperglycemia - Hemoglobin A1c  Last microalbumin: 02/2014, 04/04/2015   He provides excellent foot care. Reminded him of proper foot care and wearing shoes at all times. Diabetic Foot Exam Documented in Quality Metrics 04/04/2015   He is overdue for ophthalmology exam. He states that he cannot afford this. He has a high co-pay.   He is on ASA 22m, on ACE inhibitor, and on statin therapy.  He has been off Metformin since hospitalization--see HPI. At 04/04/2015 OV---Told him that since BS has been so well controlled, can change to Routine OVs Q 6 months, instead of every 3 months. 10/05/2015---A1C was up to 8.9----Restarted Metformin but has had problems with diarrhea---see HPI from 04/16/2016 OV for details.  AT OV 04/16/2016--- is having diarrhea even with metformin 500 mg daily. Therefore I'm going to have to discontinue the metformin. I will wait to get the A1c result before I decide dose of sulfonylurea. Cannot use Actos given his cardiac history but he is not going to be able to afford any brand  name medication.  Hyperlipidemia On pravastatin. Check FLP LFT now. He is fasting.  Essential hypertension Blood pressure is at goal. Continue current medications. On ACE inhibitor and beta blocker. Check lab to monitor.  Weight Loss Has had evaluation by myself as well as Dr. RWonda Amis Also, at [facility]---Realized that he has no lower teeth and that those dentures have been broken for long time and he cannot afford new ones--he says he has to eat soft foods.  Weight is stable.  CONSTIPATION  In past was treating with Linzess, Mirilax Not a current problem.   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 Also he is on aspirin 81 mg daily and this is also contributing to this.  BENIGN PROSTATIC HYPERTROPHY, WITH OBSTRUCTION  A screening PSA was done 09/25/12 and was normal. He is now 76years old so can stop doing screening PSA.   Screening colonoscopy: 06/03/2009. Repeat 10-15 years. Had Colonoscopy --Dr. Rehman--11/26/13--Polyps. Internal and External Hemorrhoids.   Immunizations:  Pneumonia vaccine: Patient reports that he had this at age [age]his prior PCP  .--He would have received  Pneumovax 23.   Prevnar  13 given here 09/02/14.  Tetanus vaccine: Patient recently checked with his insurance and is going to cost him $40 and he deferred.  Zostavax: Patient check with his insurance. Too expensive. The patient defers.  Influenza vaccine: Received this 04/20/14      F/U 6 months, sooner if needed.   Signed,     Signed, [address] [facility]/12/2016 10:28 AM

## 2016-10-17 ENCOUNTER — Encounter: Payer: Self-pay | Admitting: Cardiology

## 2016-10-17 ENCOUNTER — Ambulatory Visit (INDEPENDENT_AMBULATORY_CARE_PROVIDER_SITE_OTHER): Payer: Medicare Other | Admitting: Cardiology

## 2016-10-17 VITALS — BP 124/68 | HR 105 | Ht 68.0 in

## 2016-10-17 DIAGNOSIS — I350 Nonrheumatic aortic (valve) stenosis: Secondary | ICD-10-CM | POA: Diagnosis not present

## 2016-10-17 DIAGNOSIS — I5022 Chronic systolic (congestive) heart failure: Secondary | ICD-10-CM

## 2016-10-17 DIAGNOSIS — I10 Essential (primary) hypertension: Secondary | ICD-10-CM

## 2016-10-17 DIAGNOSIS — E782 Mixed hyperlipidemia: Secondary | ICD-10-CM | POA: Diagnosis not present

## 2016-10-17 DIAGNOSIS — I2510 Atherosclerotic heart disease of native coronary artery without angina pectoris: Secondary | ICD-10-CM | POA: Diagnosis not present
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
## 2016-10-17 NOTE — Patient Instructions (Signed)

## 2016-10-24 ENCOUNTER — Encounter: Payer: Self-pay | Admitting: Physician Assistant

## 2016-10-24 ENCOUNTER — Ambulatory Visit (INDEPENDENT_AMBULATORY_CARE_PROVIDER_SITE_OTHER): Payer: Medicare Other | Admitting: Physician Assistant

## 2016-10-24 VITALS — BP 118/62 | HR 82 | Temp 97.8°F | Resp 14 | Wt 146.4 lb

## 2016-10-24 DIAGNOSIS — Z794 Long term (current) use of insulin: Secondary | ICD-10-CM

## 2016-10-24 DIAGNOSIS — E119 Type 2 diabetes mellitus without complications: Secondary | ICD-10-CM | POA: Diagnosis not present

## 2016-10-24 MED ORDER — EMPAGLIFLOZIN 10 MG PO TABS: 10.0000 mg | ORAL_TABLET | Freq: Every day | ORAL | 5 refills | Status: DC

## 2016-10-24 NOTE — Progress Notes (Signed)
Patient ID: [name] MRN: [id number], DOB: 03-26-1941, 76 y.o. Date of Encounter: _0 @  Chief Complaint:  Chief Complaint  Patient presents with  . diabetic follow up    HPI: 76 y.o. year old white male  presents today for routine followup office visit.   On 04/13/2014 he had aortic valve replacement secondary to severe aortic stenosis.  Had Normal coronaries by cardiac catheterization. Preoperatively he had low EF.  At his office visit with me 05/26/14, pt stated that they did give him metformin in the hospital, but said that he had not been taking it since he got home because his blood sugars were low.  He stated that even off of Metformin, his fasting morning blood sugars had been around 138 and 99. At that visit I told him to stay off of metformin. I reviewed the fact that he had experienced further weight loss with the hospitalization and surgery--I'm sure this is contributing to his decreased sugar.  AT OV 12/2014: He states that he checks his blood sugar about once a week. Sometimes fasting or sometimes during the day.  Says this morning fasting reading was 130. Says that he usually gets around 100 to 130 for fasting readings.  States that he takes his dog for a walk every morning. They usually go around the block and then back around. Says that he feels pretty good with this exertion.  He is taking medications as directed with no adverse effects. No lightheadedness. No presyncope.  His last office visit with me prior to the hospitlaization was 02/22/2014. The following is copied from that office visit note.:  At his routine OV 09/28/2013--at that visit I noticed that he looked even thinner that day than usual. I then noticed that I had noted at his prior visit with me 06/29/13 that he had also lost weight at the time of that visit. At that visit , I initially assumed that his weight loss was intentional  secondary to dietary changes to help control his diabetes.    However, at that visit 06/29/13 he reported that he decrease his dietary intake secondary to finances. At that visit he stated   "I just have to eat what I have.   I can't afford anything more."  Weight February 2014   was 167. Weight 06/29/13             was 155. Weight 09/28/13               was 147.  09/28/13---checked labs to evaluate underlying medical causes of weight loss including CBC TSH and CME T. These were all normal.  We also discussed the fact he had a screening colonoscopy 06/03/2009 and he was told to repeat 10-15 years.  He did come in for weight check and his weight was stable at that time. Say when he came in for that we checked our staff gave him some contact information --he could call for some assistance.  At [place] 09/28/13 he says that he is using [employer] Says that they get him through to every other Thursday. He gives very pick this up. Says that he has other phone numbers that he could call for food if he needs to but hasn't needed to so far.  He had colonoscopy by Dr. Jearld Adjutant on 12/12/13. There were polyps which were biopsied. External and internal hemorrhoids. At that time Dr. Wonda Amis planned to order CT of abdomen and pelvis as well as chest.  He had  a CT scan on 12/09/13. No malignancy was seen on either CT scan. No significant finding seen on the CT scans.  Today he says that he continues to  get food once a month from 4 or 5 different places--- [employer] out reach, [employer], [employer], their church.  Weight 12/29/14 remains stable at 146.  At visit 12/29/14 I also reviewed last cardiology note.  Saw NP at Cardiology 11/22/14. Things were stable at that visit and he was to continue current medications. No labs or tests were indicated/ ordered at that time and they plan for follow-up 6 months.  04/04/2015:  He reports that sometimes when he first stands up, his left "hip" "is stiff--takes a minute to straighten up". Says there is no pain, just  stiffness.  Does not want to pursue imaging, further eval right now sec to finances--will monitor. No other complaints or concerns. Has been feeling pretty good. Still checks blood sugar once a week--fasting---gets 120s, 133, "something like that"  10/05/2015: He has no complaints during the visit. Reports he had his follow-up carotid artery ultrasound on Monday but has not heard results yet. He is taking cholesterol medication as directed. No myalgias or other adverse effects. He is taking blood pressure medications as directed with no adverse effects. When he gets up off of the exam table to go to the lab, he moves very slowly and stiff holding to his left hip. Asked what area was bothering him he points to the anterior aspect of the left hip.  10/24/2015: At [place] 10/05/2015--A1C came back high at 8.9. 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  When I got that lab result, told him to start Metformin 1,047m BID and check fasting BS QAM and come for f/u OV 2 weeks.  Today he states that he is taking the metformin 1000 mg twice a day as directed. Says it is causing him quite a bit of gas and some heartburn. Says that it's only causing a little bit of diarrhea. Says he takes a Tums and that helps the heartburn. Then adds that these symptoms were worse when he first started the Metformin and that the symptoms are getting better.  He does bring in blood sugar log sheet. Fasting morning readings have been 167, 129, 137, 125, 115, 138, 108, 136, 111, 109, 143, 145, 114, 130.  12/22/2015--- Phone message on this date that patient called reporting having diarrhea with the metformin 1000 twice a day. At time he was told to decrease down to 500 mg twice a day.  04/16/2016: Pt reports that he has decreased the metformin down to 500 mg once a day. Says he still has some diarrhea even with this low dose. He checks his blood sugar about twice a week. Says yesterday he got 158 and that was one or 2 hours after he  ate. No other complaints or concerns today. That he is feeling good. Taking blood pressure medication as directed with no lightheadedness or other adverse effects. Taking pravastatin with no myalgias or other adverse effects.   09/27/2016: Today he came in for office visit because "high blood sugar readings". This was not a routine f/u OV. (I dont know why he is overdue for ROV / why he hasnot come in for ROV prior to today) States that he has been getting readings like 350 and 320 and says that these are fasting morning readings. Says he has been getting these kinds of readings for about 2 months now.  I  reviewed his Lab from 04/2016. At that time A1c was performed. At that time I had said to add Glucotrol.  Today I asked if he was taking that as directed. He says that caused diarrhea so he is not taking it. I reviewed that  the metformin caused diarrhea in the past but asked if he was confusing this with metformin and he says no- the Glucotrol also cause diarrhea so he quit it. Now he still has some diarrhea but that it was worse with the Glucotrol. ASSESSMENT/PLAN AT THAT OV: 1. Type 2 diabetes mellitus without complication, without long-term current use of insulin (HCC) At this time Blood Sugar reading 236 and A1c 11.0 He cannot take Metformin, Actos, Glucotrol. Is not going to be able to afford multiple medications. At this time will add basal insulin. Staff has shown him how to do this and he has administered first dose here today. He is given 10 units. To then titrate up 1 unit each day until he gets to 16 units. He will then stop/ pause at 16 units. Given him a blood sugar log sheet and he is to document fasting morning readings. Is scheduling follow-up visit with me for this upcoming Monday. Bring the log sheet with him to that appointment. - Hemoglobin A1C, fingerstick - Glucose, fingerstick (stat) - Insulin Glargine (LANTUS SOLOSTAR) 100 UNIT/ML Solostar Pen; Give # units as directed at  office visit, up to 30 units daily.  Dispense: 5 pen; Refill: PRN   10/01/2016: Today he does bring in his blood sugar log sheet that we gave him at last office visit. He does have documented that he has been increasing number of units of insulin by 1 each day since that visit. Also has documented that his fasting blood sugars have been (in this chronological order according to the date): 285, 200, 267, 198. Today also reviewed the fact that he is unable to take Actos because of his cardiac issues. Can not take metformin or glipizide because each of these causes diarrhea. Verified with him again today that his diarrhea does get better when he stops the glipizide. Says that his Insulin is going to cost > $200  Assessment/Plan at that OV: 1. Type 2 diabetes mellitus without complication, without long-term current use of insulin ([place]) Today I had Maudie Mercury talk to him regarding finances and assistance that may be available to help with this as well as finding out which insulin will have the lowest co-pay with his insurance coverage. Today I have told him to continue increasing the insulin by 1 unit per day until he gets fasting sugars reading around 150. When sugar reading around 150 then he will pause and continue that number of units. At this time will add Januvia 100 mg once daily. Renal function was normal on last lab. He will continue to document fasting sugars every morning. He will have follow-up visit with me in one week.   10/08/2016: He states that he is taking the Januvia 100 mg daily. He got it at the local pharmacy and is waiting for the mail order to come in. He will pay $125 for 90 day supply. Currently using the sample of the Basaglar that we gave him at last visit. Says that he has sent in for the mail order and that his wife said it supposed to be free. Has no questions or concerns today. Having no adverse effects with the medicines.  Does bring in his blood sugar log form. Has been  increasing  by 1 unit each day. The last 6 fasting blood sugar readings have been 240, 182, 158, 176, 276, 173. He is currently at 17 units daily. A/P at that OV: Today I gave him 4 weeks worth of samples of the Januvia 100 mg daily. Today I gave him samples of 2 more of the [employer] I have written # units daily on his BS log sheet---He is to continue increasing by 1 unit daily until he gets to 20 units and then we will continue 20 units daily. Will continue on the 20 units daily until he has follow-up visit in 1-2 weeks. Bring the blood sugar log form with him to that visit. F/U sooner if needed if any questions or concerns.   Addendum added 10/10/2016:  Patient's wife her for OV with me. Reports that Januvia is causing husband to have diarrhea. I checked for samples of the 59m dose so he could try this dose but we have no Januvia 525msamples. Therefore told her to have him just stop the Januvia until his follow-up visit which she says is scheduled in just a couple weeks.  10/24/2016: He reports that he did stop the Januvia. The diarrhea did resolve.  He did increase Insulin by one unit until he got to 20 units and has continued 20 units daily since.  He does bring in BS log-- Fasting morning readings since getting to 20 units are : 189, 178, 171, 161, 143, 142, 162, 140, 178, 140, 148. A/P at this OV: Will have him increase to 22 units daily and stay at that dose.  I checked for samples of Januvia 5065mgain today but we have none.  At this time will D/C Januvia and try Jardiance 1m30m 2 boxes of samples given (total 14 tablets)    Past Medical History:  Diagnosis Date  . Aortic stenosis, mild   . BPH (benign prostatic hypertrophy)   . CAD (coronary artery disease)   . 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 Constipation   . Diabetes mellitus   . GERD (gastroesophageal reflux disease)   . History of cataract   . Hyperlipidemia   . Hypertension   . Nephrolithiasis     . Vitamin D deficiency      Home Meds: Outpatient Medications Prior to Visit  Medication Sig Dispense Refill  . alendronate (FOSAMAX) 70 MG tablet Take 1 tablet (70 mg total) by mouth every 7 (seven) days. Take with a full glass of water on an empty stomach. 12 tablet 3  . aspirin 81 MG EC tablet Take 1 tablet (81 mg total) by mouth daily. Swallow whole. 30 tablet 12  . Blood Glucose Monitoring Suppl (ONE TOUCH ULTRA SYSTEM KIT) w/Device KIT Check blood sugar twice daily. 1 each 0  . Cholecalciferol (VITAMIN D) 2000 UNITS tablet Take 2,000 Units by mouth daily.    . fish oil-omega-3 fatty acids 1000 MG capsule Take 1 g by mouth daily.     . glMarland Kitchencose blood test strip Check blood sugar (2) twice daily. 100 each 12  . Insulin Glargine (BASAGLAR KWIKPEN) 100 UNIT/ML SOPN Give number of units as directed at office visit, up to 30 units daily. 15 mL 11  . Insulin Glargine (LANTUS SOLOSTAR) 100 UNIT/ML Solostar Pen Give # units as directed at office visit, up to 30 units daily. 5 pen PRN  . Lancets (ONETOUCH ULTRASOFT) lancets Use as instructed 100 each 12  . lisinopril (PRINIVIL,ZESTRIL) 2.5  MG tablet TAKE 1 TABLET (2.5 MG TOTAL) BY MOUTH DAILY. 90 tablet 3  . metoprolol succinate (TOPROL-XL) 50 MG 24 hr tablet TAKE 1 TABLET BY MOUTH  DAILY WITH OR IMMEDIATELY  FOLLOWING A MEAL 90 tablet 3  . omeprazole (PRILOSEC) 20 MG capsule TAKE 1 CAPSULE BY MOUTH  DAILY 90 capsule 3  . pravastatin (PRAVACHOL) 40 MG tablet Take 1 tablet by mouth  daily 90 tablet 3  . sitaGLIPtin (JANUVIA) 100 MG tablet Take 1 tablet (100 mg total) by mouth daily. (Patient not taking: Reported on 10/24/2016) 30 tablet 3   No facility-administered medications prior to visit.      Allergies:  Allergies  Allergen Reactions  . Actos [Pioglitazone] Other (See Comments)    Cramps in legs   . Metformin And Related Diarrhea  . Morphine     REACTION: hives  . Statins     REACTION: leg cramps but tolerates pravastatin     Social History   Social History  . Marital status: Married    Spouse name: N/A  . Number of children: N/A  . Years of education: N/A   Occupational History  . Not on file.   Social History Main Topics  . Smoking status: Former Smoker    Packs/day: 1.00    Years: 30.00    Quit date: 06/10/1999  . Smokeless tobacco: Former Systems developer    Types: [place] date: 04/09/2014     Comment: Quit >15 years ago  . Alcohol use No  . Drug use: No  . Sexual activity: Not Currently   Other Topics Concern  . Not on file   Social History Narrative   Married for >44 years    Family History  Problem Relation Age of Onset  . Heart disease Mother   . Congestive Heart Failure Father   . Heart failure Sister 73     Review of Systems:  See HPI for pertinent ROS. All other ROS negative.    Physical Exam: Blood pressure 118/62, pulse 82, temperature 97.8 F (36.6 C), temperature source Oral, resp. rate 14, weight 146 lb 6.4 oz (66.4 kg), SpO2 98 %., Body mass index is 22.26 kg/m. General: Thin WM. Appears in no acute distress. Neck: Bilateral Carotid Bruits.  Lungs: Crackles at bases bilaterally. O/w clear.  Heart: RRR with S1 S2. I/VI murmur at 2nd ICS on Right Musculoskeletal:  Strength and tone normal for age. Extremities/Skin: Warm and dry.  No edema.  Forearms with multiple scattered areas of deep reddish-purplish purpura.  Neuro: Alert and oriented X 3. Moves all extremities spontaneously. Gait is normal. CNII-XII grossly in tact. Psych:  Responds to questions appropriately with a normal affect. Diabetic Foot Exam: Inspection: Normal. No wounds, no callouses, worisome areas. Sensation intact. 1+-2+ DP pulses bilaterally. Trace PT pulses bilaterally.      ASSESSMENT AND PLAN:  76 y.o. year old male with   1. Type 2 diabetes mellitus without complication, without long-term current use of insulin ([place]) Will have him increase to 22 units daily and stay at that dose.  I checked  for samples of Januvia 43m again today but we have none.  At this time will D/C Januvia and try Jardiance 188m-- 2 boxes of samples given (total 14 tablets)  Discussed that if he gets BS <80, call me. Discussed that if he ever has vomiting/diarrhea/no appetite--do not give insulin---call me Can wait 3 months for f/u OV.  Call if any adverse effects with  Jardiance.    THE FOLLOWING WAS NOT COVERED AT OV 09/27/2016 or at [place] 10/01/2016 or at [facility] [place] 10/08/2016, 10/24/2016----THE FOLLOWING IS COPIED FROM PRIOR OV:   Subclinical hypothyroidism - TSH - T4, free  Osteopenia determined by x-ray Prior X-ray showed osteopenia. Patient has never had bone density scan. Discussed having bone density scan and he is agreeable. - DG Bone Density; Future Addendum Added 11/14/15--- DEXA shows Osteoporosis.  Pt to start Fosamax '70mg'$  once weekly.  Osteoporosis --S/P DEXA 11/2015 --Started Fosamax 11/14/2015  Left hip pain --will obtain XRay  S/P AVR (aortic valve replacement) --Managed by Cardiology  Cardiomyopathy, dilated, nonischemic --Managed by Cardiology  Carotid artery stenosis, bilateral In the past, cardiology was ordering his carotid artery ultrasounds. However, patient had stopped following up with cardiology as he could not afford to pay the co-pay. Because of this financial situation, I  Ordered Echo and carotid artery Dopplers more recently.  And I ordered another set of carotid Dopplers which was performed on 10/01/14. Stable. 60-79% bilateral ICA stenosis. Follow-up one year. --F/U performed 09/2015:  Stable. 60-79% bilateral ICA stenosis. Follow-up one year.  Bilateral carotid bruits - see #3 above  Type 2 diabetes mellitus with hyperglycemia - Hemoglobin A1c  Last microalbumin: 02/2014, 04/04/2015   He provides excellent foot care. Reminded him of proper foot care and wearing shoes at all times. Diabetic Foot Exam Documented in Quality Metrics 04/04/2015   He is overdue for  ophthalmology exam. He states that he cannot afford this. He has a high co-pay.   He is on ASA '81mg'$ , on ACE inhibitor, and on statin therapy.  He has been off Metformin since hospitalization--see HPI. At 04/04/2015 OV---Told him that since BS has been so well controlled, can change to Routine OVs Q 6 months, instead of every 3 months. 10/05/2015---A1C was up to 8.9----Restarted Metformin but has had problems with diarrhea---see HPI from 04/16/2016 OV for details.  AT OV 04/16/2016--- is having diarrhea even with metformin 500 mg daily. Therefore I'm going to have to discontinue the metformin. I will wait to get the A1c result before I decide dose of sulfonylurea. Cannot use Actos given his cardiac history but he is not going to be able to afford any brand name medication.  Hyperlipidemia On pravastatin. Check FLP LFT now. He is fasting.  Essential hypertension Blood pressure is at goal. Continue current medications. On ACE inhibitor and beta blocker. Check lab to monitor.  Weight Loss Has had evaluation by myself as well as Dr. Wonda Amis. Also, at [place] 04/04/2015---Realized that he has no lower teeth and that those dentures have been broken for long time and he cannot afford new ones--he says he has to eat soft foods.  Weight is stable.  CONSTIPATION  In past was treating with Linzess, Mirilax Not a current problem.   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 Also he is on aspirin 81 mg daily and this is also contributing to this.  BENIGN PROSTATIC HYPERTROPHY, WITH OBSTRUCTION  A screening PSA was done 09/25/12 and was normal. He is now 76 years old so can stop doing screening PSA.   Screening colonoscopy: 06/03/2009. Repeat 10-15 years. Had Colonoscopy --Dr. Rehman--11/26/13--Polyps. Internal and External Hemorrhoids.   Immunizations:  Pneumonia vaccine: Patient reports that he had this at age 19 his prior PCP  .--He would have  received  Pneumovax 23.   Prevnar  13 given here 09/02/14.  Tetanus vaccine: Patient recently  checked with his insurance and is going to cost him $40 and he deferred.  Zostavax: Patient check with his insurance. Too expensive. The patient defers.  Influenza vaccine: Received this 04/20/14      F/U 6 months, sooner if needed.   Signed,     Signed, [address] [place], [place], [facility] 10/24/2016 11:11 AM

## 2016-10-30 ENCOUNTER — Other Ambulatory Visit: Payer: Self-pay | Admitting: Physician Assistant

## 2016-10-30 ENCOUNTER — Telehealth: Payer: Self-pay

## 2016-10-30 DIAGNOSIS — I6523 Occlusion and stenosis of bilateral carotid arteries: Secondary | ICD-10-CM

## 2016-10-30 MED ORDER — INSULIN PEN NEEDLE 32G X 4 MM MISC: 2 refills | Status: DC

## 2016-10-30 NOTE — Telephone Encounter (Signed)
Pharmacy  sent over fax requesting pen needle [id number]   Rx filled

## 2016-11-05 ENCOUNTER — Telehealth: Payer: Self-pay | Admitting: Family Medicine

## 2016-11-05 NOTE — Telephone Encounter (Signed)
Cannot tolerate metformin, Actos, Glucotrol. Cannot afford Januvia secondary to cost either. Will have him take just Insulin at this time with no oral med for diabetes at this time.

## 2016-11-05 NOTE — Telephone Encounter (Signed)
[name] is over $100 copay.  That is too high.  What else can we use??

## 2016-11-05 NOTE — Telephone Encounter (Signed)
Spoke with patient regarding his medications explained to him to stop taking the diabetic pills due to intolerance or cost and for him to use the insulin. We also provided a number to [employer] where he can call to get assistance with paying for his medications.  Patient states he has not had time to all the number provided and verbally states he understands what he should due in regards to his medications

## 2016-11-12 ENCOUNTER — Ambulatory Visit (HOSPITAL_COMMUNITY)
Admission: RE | Admit: 2016-11-12 | Discharge: 2016-11-12 | Disposition: A | Discharge status: Home / Self Care | Payer: Medicare Other | Source: Ambulatory Visit | Attending: Cardiology | Admitting: Cardiology

## 2016-11-12 DIAGNOSIS — I6523 Occlusion and stenosis of bilateral carotid arteries: Secondary | ICD-10-CM

## 2016-11-29 ENCOUNTER — Encounter: Payer: Self-pay | Admitting: Physician Assistant

## 2016-11-29 ENCOUNTER — Ambulatory Visit (INDEPENDENT_AMBULATORY_CARE_PROVIDER_SITE_OTHER): Payer: Medicare Other | Admitting: Physician Assistant

## 2016-11-29 VITALS — BP 122/70 | HR 70 | Temp 97.5°F | Resp 16 | Wt 142.4 lb

## 2016-11-29 DIAGNOSIS — L0291 Cutaneous abscess, unspecified: Secondary | ICD-10-CM | POA: Diagnosis not present

## 2016-11-29 MED ORDER — SULFAMETHOXAZOLE-TRIMETHOPRIM 800-160 MG PO TABS: 1.0000 | ORAL_TABLET | Freq: Two times a day (BID) | ORAL | 0 refills | Status: DC

## 2016-11-29 NOTE — Progress Notes (Signed)
Patient ID: [name] MRN: [id number], DOB: 1941/06/29, 76 y.o. Date of Encounter: 11/29/2016, 9:38 AM    Chief Complaint:  Chief Complaint  Patient presents with  . knot on back of neck     HPI: 76 y.o. year old male has recently noticed "knot on left side/ back of neck" so came in. No other concerns. Has had no fever, chills.     Home Meds:   Outpatient Medications Prior to Visit  Medication Sig Dispense Refill  . aspirin 81 MG EC tablet Take 1 tablet (81 mg total) by mouth daily. Swallow whole. 30 tablet 12  . Blood Glucose Monitoring Suppl (ONE TOUCH ULTRA SYSTEM KIT) w/Device KIT Check blood sugar twice daily. 1 each 0  . Cholecalciferol (VITAMIN D) 2000 UNITS tablet Take 2,000 Units by mouth daily.    . empagliflozin (JARDIANCE) 10 MG TABS tablet Take 10 mg by mouth daily. 30 tablet 5  . fish oil-omega-3 fatty acids 1000 MG capsule Take 1 g by mouth daily.     Marland Kitchen glucose blood test strip Check blood sugar (2) twice daily. 100 each 12  . Insulin Glargine (BASAGLAR KWIKPEN) 100 UNIT/ML SOPN Give number of units as directed at office visit, up to 30 units daily. (Patient taking differently: 22 Units. Give number of units as directed at office visit, up to 30 units daily.) 15 mL 11  . Insulin Pen Needle (BD PEN NEEDLE NANO U/F) 32G X 4 MM MISC Use to check blood sugar daily 90 each 2  . Lancets (ONETOUCH ULTRASOFT) lancets Use as instructed 100 each 12  . lisinopril (PRINIVIL,ZESTRIL) 2.5 MG tablet TAKE 1 TABLET (2.5 MG TOTAL) BY MOUTH DAILY. 90 tablet 3  . metoprolol succinate (TOPROL-XL) 50 MG 24 hr tablet TAKE 1 TABLET BY MOUTH  DAILY WITH OR IMMEDIATELY  FOLLOWING A MEAL 90 tablet 3  . omeprazole (PRILOSEC) 20 MG capsule TAKE 1 CAPSULE BY MOUTH  DAILY 90 capsule 3  . pravastatin (PRAVACHOL) 40 MG tablet Take 1 tablet by mouth  daily 90 tablet 3  . alendronate (FOSAMAX) 70 MG tablet Take 1 tablet (70 mg total) by mouth every 7 (seven) days. Take with a full glass of water  on an empty stomach. 12 tablet 3  . Insulin Glargine (LANTUS SOLOSTAR) 100 UNIT/ML Solostar Pen Give # units as directed at office visit, up to 30 units daily. 5 pen PRN   No facility-administered medications prior to visit.     Allergies:  Allergies  Allergen Reactions  . Actos [Pioglitazone] Other (See Comments)    Cramps in legs   . Metformin And Related Diarrhea  . Morphine     REACTION: hives  . Statins     REACTION: leg cramps but tolerates pravastatin      Review of Systems: See HPI for pertinent ROS. All other ROS negative.    Physical Exam: Blood pressure 122/70, pulse 70, temperature 97.5 F (36.4 C), temperature source Oral, resp. rate 16, weight 142 lb 6.4 oz (64.6 kg), SpO2 98 %., Body mass index is 21.65 kg/m. General:  WM. Appears in no acute distress. Neck: Supple. No thyromegaly. No lymphadenopathy. Lungs: Clear bilaterally to auscultation without wheezes, rales, or rhonchi. Breathing is unlabored. Heart: Regular rhythm. Msk:  Strength and tone normal for age. Skin: Left Posterior-lateral neck: 3cm x 2cm oval shaped abscess. Raised ~ 32m.  Neuro: Alert and oriented X 3. Moves all extremities spontaneously. Gait is normal. CNII-XII grossly in tact. Psych:  Responds to questions appropriately with a normal affect.     ASSESSMENT AND PLAN:  76 y.o. year old male with  1. Abscess Site cleaned with Betadine and Alcohol. Anaesthecised with Epi+Lidocaine. Incised with scapel. Large amount of copious drainage came out. Was able to remove all of drainage to where site was flat at skin level at end of procedure. Site covered, bandaged.  He is to start the antibiotic immediately take as directed and complete all of it. He is to have his wife examine the site daily and if it starts to recur/worsen follow-up immediately and if the site does not completely resolve with completion of antibiotic, then follow-up as well. - sulfamethoxazole-trimethoprim (BACTRIM DS,SEPTRA  DS) 800-160 MG tablet; Take 1 tablet by mouth 2 (two) times daily.  Dispense: 20 tablet; Refill: 0   Signed, [address] [place], [place], [facility] 11/29/2016 9:38 AM

## 2016-11-30 ENCOUNTER — Other Ambulatory Visit: Payer: Self-pay | Admitting: Physician Assistant

## 2016-11-30 NOTE — Telephone Encounter (Signed)
Refill appropriate 

## 2017-01-24 ENCOUNTER — Ambulatory Visit (INDEPENDENT_AMBULATORY_CARE_PROVIDER_SITE_OTHER): Payer: Medicare Other | Admitting: Physician Assistant

## 2017-01-24 ENCOUNTER — Encounter: Payer: Self-pay | Admitting: Physician Assistant

## 2017-01-24 VITALS — BP 130/70 | HR 70 | Temp 97.6°F | Resp 16 | Ht 68.0 in | Wt 144.0 lb

## 2017-01-24 DIAGNOSIS — E039 Hypothyroidism, unspecified: Secondary | ICD-10-CM | POA: Diagnosis not present

## 2017-01-24 DIAGNOSIS — Z952 Presence of prosthetic heart valve: Secondary | ICD-10-CM

## 2017-01-24 DIAGNOSIS — I10 Essential (primary) hypertension: Secondary | ICD-10-CM | POA: Diagnosis not present

## 2017-01-24 DIAGNOSIS — I350 Nonrheumatic aortic (valve) stenosis: Secondary | ICD-10-CM

## 2017-01-24 DIAGNOSIS — Z794 Long term (current) use of insulin: Secondary | ICD-10-CM

## 2017-01-24 DIAGNOSIS — E119 Type 2 diabetes mellitus without complications: Secondary | ICD-10-CM | POA: Diagnosis not present

## 2017-01-24 DIAGNOSIS — E038 Other specified hypothyroidism: Secondary | ICD-10-CM

## 2017-01-24 DIAGNOSIS — I420 Dilated cardiomyopathy: Secondary | ICD-10-CM

## 2017-01-24 DIAGNOSIS — I6529 Occlusion and stenosis of unspecified carotid artery: Secondary | ICD-10-CM

## 2017-01-24 DIAGNOSIS — E785 Hyperlipidemia, unspecified: Secondary | ICD-10-CM

## 2017-01-24 NOTE — Progress Notes (Signed)
Patient ID: CHANTRY TORCHIO MRN: [id number], DOB: 1940/11/21, 76 y.o. Date of Encounter: @DATE @  Chief Complaint:  Chief Complaint  Patient presents with  . Blood Sugar Problem    f/u    HPI: 76 y.o. year old white male  presents today for routine followup office visit.   On 04/13/2014 he had aortic valve replacement secondary to severe aortic stenosis.  Had Normal coronaries by cardiac catheterization. Preoperatively he had low EF.  At his office visit with me 05/26/14, pt stated that they did give him metformin in the hospital, but said that he had not been taking it since he got home because his blood sugars were low.  He stated that even off of Metformin, his fasting morning blood sugars had been around 138 and 99. At that visit I told him to stay off of metformin. I reviewed the fact that he had experienced further weight loss with the hospitalization and surgery--I'm sure this is contributing to his decreased sugar.  AT OV 12/2014: He states that he checks his blood sugar about once a week. Sometimes fasting or sometimes during the day.  Says this morning fasting reading was 130. Says that he usually gets around 100 to 130 for fasting readings.  States that he takes his dog for a walk every morning. They usually go around the block and then back around. Says that he feels pretty good with this exertion.  He is taking medications as directed with no adverse effects. No lightheadedness. No presyncope.  His last office visit with me prior to the hospitlaization was 02/22/2014. The following is copied from that office visit note.:  At his routine OV 09/28/2013--at that visit I noticed that he looked even thinner that day than usual. I then noticed that I had noted at his prior visit with me 06/29/13 that he had also lost weight at the time of that visit. At that visit , I initially assumed that his weight loss was intentional  secondary to dietary changes to help control his  diabetes.  However, at that visit 06/29/13 he reported that he decrease his dietary intake secondary to finances. At that visit he stated   "I just have to eat what I have.   I can't afford anything more."  Weight February 2014   was 167. Weight 06/29/13             was 155. Weight 09/28/13               was 147.  09/28/13---checked labs to evaluate underlying medical causes of weight loss including CBC TSH and CME T. These were all normal.  We also discussed the fact he had a screening colonoscopy 06/03/2009 and he was told to repeat 10-15 years.  He did come in for weight check and his weight was stable at that time. Say when he came in for that we checked our staff gave him some contact information --he could call for some assistance.  At OV 09/28/13 he says that he is using [employer]. Says that they get him through to every other Thursday. He gives very pick this up. Says that he has other phone numbers that he could call for food if he needs to but hasn't needed to so far.  He had colonoscopy by Dr. Jackelyn Knife on 12/12/13. There were polyps which were biopsied. External and internal hemorrhoids. At that time Dr. Kelvin Cellar planned to order CT of abdomen and pelvis as well as chest.  He had a CT scan on 12/09/13. No malignancy was seen on either CT scan. No significant finding seen on the CT scans.  Today he says that he continues to  get food once a month from 4 or 5 different places--- [employer] out reach, [employer], [employer], their church.  Weight 12/29/14 remains stable at 146.  At visit 12/29/14 I also reviewed last cardiology note.  Saw NP at Cardiology 11/22/14. Things were stable at that visit and he was to continue current medications. No labs or tests were indicated/ ordered at that time and they plan for follow-up 6 months.  04/04/2015:  He reports that sometimes when he first stands up, his left "hip" "is stiff--takes a minute to straighten up". Says there is no pain,  just stiffness.  Does not want to pursue imaging, further eval right now sec to finances--will monitor. No other complaints or concerns. Has been feeling pretty good. Still checks blood sugar once a week--fasting---gets 120s, 133, "something like that"  10/05/2015: He has no complaints during the visit. Reports he had his follow-up carotid artery ultrasound on Monday but has not heard results yet. He is taking cholesterol medication as directed. No myalgias or other adverse effects. He is taking blood pressure medications as directed with no adverse effects. When he gets up off of the exam table to go to the lab, he moves very slowly and stiff holding to his left hip. Asked what area was bothering him he points to the anterior aspect of the left hip.  10/24/2015: At OV 10/05/2015--A1C came back high at 8.9. 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  When I got that lab result, told him to start Metformin 1,000mg  BID and check fasting BS QAM and come for f/u OV 2 weeks.  Today he states that he is taking the metformin 1000 mg twice a day as directed. Says it is causing him quite a bit of gas and some heartburn. Says that it's only causing a little bit of diarrhea. Says he takes a Tums and that helps the heartburn. Then adds that these symptoms were worse when he first started the Metformin and that the symptoms are getting better.  He does bring in blood sugar log sheet. Fasting morning readings have been 167, 129, 137, 125, 115, 138, 108, 136, 111, 109, 143, 145, 114, 130.  12/22/2015--- Phone message on this date that patient called reporting having diarrhea with the metformin 1000 twice a day. At time he was told to decrease down to 500 mg twice a day.  04/16/2016: Pt reports that he has decreased the metformin down to 500 mg once a day. Says he still has some diarrhea even with this low dose. He checks his blood sugar about twice a week. Says yesterday he got 158 and that was one or 2 hours  after he ate. No other complaints or concerns today. That he is feeling good. Taking blood pressure medication as directed with no lightheadedness or other adverse effects. Taking pravastatin with no myalgias or other adverse effects.   09/27/2016: Today he came in for office visit because "high blood sugar readings". This was not a routine f/u OV. (I dont know why he is overdue for ROV / why he hasnot come in for ROV prior to today) States that he has been getting readings like 350 and 320 and says that these are fasting morning readings. Says he has been getting these kinds of readings for about 2 months now.  I reviewed his Lab from 04/2016. At that time A1c was performed. At that time I had said to add Glucotrol.  Today I asked if he was taking that as directed. He says that caused diarrhea so he is not taking it. I reviewed that  the metformin caused diarrhea in the past but asked if he was confusing this with metformin and he says no- the Glucotrol also cause diarrhea so he quit it. Now he still has some diarrhea but that it was worse with the Glucotrol. ASSESSMENT/PLAN AT THAT OV: 1. Type 2 diabetes mellitus without complication, without long-term current use of insulin (HCC) At this time Blood Sugar reading 236 and A1c 11.0 He cannot take Metformin, Actos, Glucotrol. Is not going to be able to afford multiple medications. At this time will add basal insulin. Staff has shown him how to do this and he has administered first dose here today. He is given 10 units. To then titrate up 1 unit each day until he gets to 16 units. He will then stop/ pause at 16 units. Given him a blood sugar log sheet and he is to document fasting morning readings. Is scheduling follow-up visit with me for this upcoming Monday. Bring the log sheet with him to that appointment. - Hemoglobin A1C, fingerstick - Glucose, fingerstick (stat) - Insulin Glargine (LANTUS SOLOSTAR) 100 UNIT/ML Solostar Pen; Give # units as  directed at office visit, up to 30 units daily.  Dispense: 5 pen; Refill: PRN   10/01/2016: Today he does bring in his blood sugar log sheet that we gave him at last office visit. He does have documented that he has been increasing number of units of insulin by 1 each day since that visit. Also has documented that his fasting blood sugars have been (in this chronological order according to the date): 285, 200, 267, 198. Today also reviewed the fact that he is unable to take Actos because of his cardiac issues. Can not take metformin or glipizide because each of these causes diarrhea. Verified with him again today that his diarrhea does get better when he stops the glipizide. Says that his Insulin is going to cost > $200  Assessment/Plan at that OV: 1. Type 2 diabetes mellitus without complication, without long-term current use of insulin (HCC) Today I had Selena Batten talk to him regarding finances and assistance that may be available to help with this as well as finding out which insulin will have the lowest co-pay with his insurance coverage. Today I have told him to continue increasing the insulin by 1 unit per day until he gets fasting sugars reading around 150. When sugar reading around 150 then he will pause and continue that number of units. At this time will add Januvia 100 mg once daily. Renal function was normal on last lab. He will continue to document fasting sugars every morning. He will have follow-up visit with me in one week.   10/08/2016: He states that he is taking the Januvia 100 mg daily. He got it at the local pharmacy and is waiting for the mail order to come in. He will pay $125 for 90 day supply. Currently using the sample of the Basaglar that we gave him at last visit. Says that he has sent in for the mail order and that his wife said it supposed to be free. Has no questions or concerns today. Having no adverse effects with the medicines.  Does bring in his blood sugar log form.  Has been  increasing by 1 unit each day. The last 6 fasting blood sugar readings have been 240, 182, 158, 176, 276, 173. He is currently at 17 units daily. A/P at that OV: Today I gave him 4 weeks worth of samples of the Januvia 100 mg daily. Today I gave him samples of 2 more of the [employer]. I have written # units daily on his BS log sheet---He is to continue increasing by 1 unit daily until he gets to 20 units and then we will continue 20 units daily. Will continue on the 20 units daily until he has follow-up visit in 1-2 weeks. Bring the blood sugar log form with him to that visit. F/U sooner if needed if any questions or concerns.   Addendum added 10/10/2016:  Patient's wife her for OV with me. Reports that Januvia is causing husband to have diarrhea. I checked for samples of the 50mg  dose so he could try this dose but we have no Januvia 50mg  samples. Therefore told her to have him just stop the Januvia until his follow-up visit which she says is scheduled in just a couple weeks.  10/24/2016: He reports that he did stop the Januvia. The diarrhea did resolve.  He did increase Insulin by one unit until he got to 20 units and has continued 20 units daily since.  He does bring in BS log-- Fasting morning readings since getting to 20 units are : 189, 178, 171, 161, 143, 142, 162, 140, 178, 140, 148. A/P at this OV: Will have him increase to 22 units daily and stay at that dose.  I checked for samples of Januvia 50mg  again today but we have none.  At this time will D/C Januvia and try Jardiance 10mg  -- 2 boxes of samples given (total 14 tablets)    01/24/2017: Today he reports he has been feeling very well. Is that his granddaughter is living with them for the summer and that she is looking into joining the Y and he is asking about whether be okay for him to swim if they joined the Y. I told him that that would be excellent exercise for him and highly encouraged him to do this. Told him to just be  careful not to hit his toes or feet but otherwise encouraged him to do that and for him to see if there are any classes offered especially senior classes for water aerobics.  He is currently taking his insulin at 22 units daily. He states that financially it is difficult to have the money for every prescriptions that he has been coming here and getting some samples. As well today I have given him 4 sample pens of Basaglar.  He brings in his blood sugar log. He only checks fasting morning readings but he does check this every day and knees are excellent. For the month of June since June 1 he has been getting the following readings: 114, 112, 153, 120, 108, 105, 166, 113, 125, 135, 120, 139, 198, 137, 118, 139, 149.  I did review telephone notes that are documented from 11/05/2016. This documents that Jardiance -- for him was going to cost over $100 co-pay and that was too expensive. I then reviewed the fact that he cannot tolerate metformin, Actos, Glucotrol. Can not afford Januvia secondary to cost either. Therefore plan to just have him take insulin with no oral medicine for diabetes. Patient was informed of this. He also was given a number to [facility] where he can call  to get assistance for pain for his medicines.  He is taking blood pressure medications as directed. Having no lightheadedness or other adverse effects.  He is taking his pravastatin daily. No myalgias or right upper quadrant pain.  He has no concerns to address today.   Past Medical History:  Diagnosis Date  . Aortic stenosis, mild   . BPH (benign prostatic hypertrophy)   . CAD (coronary artery disease)   . 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 Constipation   . Diabetes mellitus   . GERD (gastroesophageal reflux disease)   . History of cataract   . Hyperlipidemia   . Hypertension   . Nephrolithiasis   . Vitamin D deficiency      Home Meds: Outpatient Medications Prior to Visit  Medication Sig  Dispense Refill  . aspirin 81 MG EC tablet Take 1 tablet (81 mg total) by mouth daily. Swallow whole. 30 tablet 12  . Blood Glucose Monitoring Suppl (ONE TOUCH ULTRA SYSTEM KIT) w/Device KIT Check blood sugar twice daily. 1 each 0  . Cholecalciferol (VITAMIN D) 2000 UNITS tablet Take 2,000 Units by mouth daily.    . fish oil-omega-3 fatty acids 1000 MG capsule Take 1 g by mouth daily.     Marland Kitchen glucose blood test strip Check blood sugar (2) twice daily. 100 each 12  . Insulin Glargine (BASAGLAR KWIKPEN) 100 UNIT/ML SOPN Give number of units as directed at office visit, up to 30 units daily. (Patient taking differently: 22 Units. Give number of units as directed at office visit, up to 30 units daily.) 15 mL 11  . Insulin Pen Needle (BD PEN NEEDLE NANO U/F) 32G X 4 MM MISC Use to check blood sugar daily 90 each 2  . Lancets (ONETOUCH ULTRASOFT) lancets Use as instructed 100 each 12  . lisinopril (PRINIVIL,ZESTRIL) 2.5 MG tablet TAKE 1 TABLET BY MOUTH  DAILY 90 tablet 0  . metoprolol succinate (TOPROL-XL) 50 MG 24 hr tablet TAKE 1 TABLET BY MOUTH  DAILY WITH OR IMMEDIATELY  FOLLOWING A MEAL 90 tablet 3  . omeprazole (PRILOSEC) 20 MG capsule TAKE 1 CAPSULE BY MOUTH  DAILY 90 capsule 3  . pravastatin (PRAVACHOL) 40 MG tablet Take 1 tablet by mouth  daily 90 tablet 3  . empagliflozin (JARDIANCE) 10 MG TABS tablet Take 10 mg by mouth daily. 30 tablet 5  . JANUVIA 100 MG tablet Take 100 mg by mouth daily.    Marland Kitchen sulfamethoxazole-trimethoprim (BACTRIM DS,SEPTRA DS) 800-160 MG tablet Take 1 tablet by mouth 2 (two) times daily. 20 tablet 0   No facility-administered medications prior to visit.      Allergies:  Allergies  Allergen Reactions  . Actos [Pioglitazone] Other (See Comments)    Cramps in legs   . Metformin And Related Diarrhea  . Morphine     REACTION: hives  . Statins     REACTION: leg cramps but tolerates pravastatin    Social History   Social History  . Marital status: Married     Spouse name: N/A  . Number of children: N/A  . Years of education: N/A   Occupational History  . Not on file.   Social History Main Topics  . Smoking status: Former Smoker    Packs/day: 1.00    Years: 30.00    Quit date: 06/10/1999  . Smokeless tobacco: Former Neurosurgeon    Types: Chew    Quit date: 04/09/2014     Comment: Quit >  15 years ago  . Alcohol use No  . Drug use: No  . Sexual activity: Not Currently   Other Topics Concern  . Not on file   Social History Narrative   Married for >44 years    Family History  Problem Relation Age of Onset  . Heart disease Mother   . Congestive Heart Failure Father   . Heart failure Sister 19     Review of Systems:  See HPI for pertinent ROS. All other ROS negative.    Physical Exam: Blood pressure 130/70, pulse 70, temperature 97.6 F (36.4 C), temperature source Oral, resp. rate 16, height 5\' 8"  (1.727 m), weight 144 lb (65.3 kg), SpO2 96 %., There is no height or weight on file to calculate BMI. General: Thin WM. Appears in no acute distress. Neck: Bilateral Carotid Bruits.  Lungs: Crackles at bases bilaterally. O/w clear.  Heart: RRR with S1 S2. I/VI murmur at 2nd ICS on Right Musculoskeletal:  Strength and tone normal for age. Extremities/Skin: Warm and dry.  No edema.  Forearms with multiple scattered areas of deep reddish-purplish purpura.  Neuro: Alert and oriented X 3. Moves all extremities spontaneously. Gait is normal. CNII-XII grossly in tact. Psych:  Responds to questions appropriately with a normal affect. Diabetic Foot Exam: Inspection: Normal. No wounds, no callouses, worisome areas. Sensation intact. 1+-2+ DP pulses bilaterally. Trace PT pulses bilaterally.      ASSESSMENT AND PLAN:  76 y.o. year old male with   1. Type 2 diabetes mellitus without complication, with long-term current use of insulin (HCC)  01/24/2017: I did review telephone notes that are documented from 11/05/2016. This documents that Jardiance -- for  him was going to cost over $100 co-pay and that was too expensive. I then reviewed the fact that he cannot tolerate metformin, Actos, Glucotrol. Can not afford Januvia secondary to cost either. Therefore plan to just have him take insulin with no oral medicine for diabetes. Patient was informed of this. He also was given a number to [facility] where he can call to get assistance for pain for his medicines.  01/24/2017: Discussed with him that his fasting blood sugar readings are excellent. Continue insulin at 22 units daily. At today's visit I gave him 4 or 5 boxes of Basaglar Check A1c to monitor. Encouraged him to [facility] at the [employer] see history of present illness.  Last microalbumin 04/16/2016  He provides excellent foot care. Reminded him of proper foot care and wearing shoes at all times.   He is overdue for ophthalmology exam. He states that he cannot afford this. He has a high co-pay.   He is on ASA 81mg , on ACE inhibitor, and on statin therapy.     Subclinical hypothyroidism 01/24/2017: Check labs to monitor - TSH - T4, free  Osteopenia determined by x-ray Prior X-ray showed osteopenia. Patient has never had bone density scan. Discussed having bone density scan and he is agreeable. - DG Bone Density; Future Addendum Added 11/14/15--- DEXA shows Osteoporosis.  Pt to start Fosamax 70mg  once weekly.  Osteoporosis 01/24/2017: --S/P DEXA 11/2015 01/24/2017: --Started Fosamax 11/14/2015   S/P AVR (aortic valve replacement) 01/24/2017: --Managed by Cardiology  Cardiomyopathy, dilated, nonischemic -01/24/2017: -Managed by Cardiology  Carotid artery stenosis, bilateral In the past, cardiology was ordering his carotid artery ultrasounds. However, patient had stopped following up with cardiology as he could not afford to pay the co-pay. Because of this financial situation, I  Ordered Echo and carotid artery Dopplers more  recently.  And I ordered another set of carotid Dopplers which  was performed on 10/01/14. Stable. 60-79% bilateral ICA stenosis. Follow-up one year. --F/U performed 09/2015:  Stable. 60-79% bilateral ICA stenosis. Follow-up one year.  01/24/2017:  He had follow-up carotid artery ultrasound 11/12/16. I did send a message to Dr. Wyline Mood and he did follow-up with me and verified that recommendation a follow-up in one year. Repeat carotid ultrasound one year. Bilateral carotid bruits - see  above     Hyperlipidemia 01/24/2017: On pravastatin. Check FLP LFT now. He is fasting.  Essential hypertension 01/24/2017:Blood pressure is at goal. Continue current medications. On ACE inhibitor and beta blocker. Check lab to monitor.  Weight Loss Has had evaluation by myself as well as Dr. Kelvin Cellar. Also, at OV 04/04/2015---Realized that he has no lower teeth and that those dentures have been broken for long time and he cannot afford new ones--he says he has to eat soft foods.  Weight is stable.  CONSTIPATION  In past was treating with Linzess, Mirilax Not a current problem.   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 Also he is on aspirin 81 mg daily and this is also contributing to this.  BENIGN PROSTATIC HYPERTROPHY, WITH OBSTRUCTION  A screening PSA was done 09/25/12 and was normal. He is now 76 years old so can stop doing screening PSA.   Screening colonoscopy: 06/03/2009. Repeat 10-15 years. Had Colonoscopy --Dr. Rehman--11/26/13--Polyps. Internal and External Hemorrhoids.   Immunizations:  Pneumonia vaccine: Patient reports that he had this at age 89 his prior PCP  .--He would have received  Pneumovax 23.   Prevnar  13 given here 09/02/14.  Tetanus vaccine: Patient recently checked with his insurance and is going to cost him $40 and he deferred.  Zostavax: Patient check with his insurance. Too expensive. The patient defers.  Influenza vaccine: Received this 04/20/14   F/U 3 months,  sooner if needed.   Signed,     Signed, [address] [place], [place], [facility] 01/24/2017 10:16 AM

## 2017-01-25 LAB — HEMOGLOBIN A1C
Hgb A1c MFr Bld: 7.8 % — ABNORMAL HIGH (ref <5.7)
Mean Plasma Glucose: 177 mg/dL

## 2017-01-25 LAB — COMPLETE METABOLIC PANEL WITH GFR
ALBUMIN: 3.8 g/dL (ref 3.6–5.1)
ALT: 20 U/L (ref 9–46)
AST: 22 U/L (ref 10–35)
Alkaline Phosphatase: 135 U/L — ABNORMAL HIGH (ref 40–115)
BUN: 10 mg/dL (ref 7–25)
CHLORIDE: 101 mmol/L (ref 98–110)
CO2: 26 mmol/L (ref 20–31)
CREATININE: 0.71 mg/dL (ref 0.70–1.18)
Calcium: 9.3 mg/dL (ref 8.6–10.3)
GFR, Est African American: >89 mL/min (ref >=60)
GFR, Est Non African American: >89 mL/min (ref >=60)
GLUCOSE: 130 mg/dL — AB (ref 70–99)
POTASSIUM: 3.9 mmol/L (ref 3.5–5.3)
SODIUM: 138 mmol/L (ref 135–146)
Total Bilirubin: 0.7 mg/dL (ref 0.2–1.2)
Total Protein: 6.6 g/dL (ref 6.1–8.1)

## 2017-01-25 LAB — LIPID PANEL
CHOL/HDL RATIO: 4.1 ratio (ref <5.0)
Cholesterol: 159 mg/dL (ref <200)
HDL: 39 mg/dL — ABNORMAL LOW (ref >40)
LDL Cholesterol: 103 mg/dL — ABNORMAL HIGH (ref <100)
Triglycerides: 87 mg/dL (ref <150)
VLDL: 17 mg/dL (ref <30)

## 2017-01-25 LAB — TSH: TSH: 5.52 m[IU]/L — AB (ref 0.40–4.50)

## 2017-01-25 LAB — T4, FREE: FREE T4: 1 ng/dL (ref 0.8–1.8)

## 2017-03-28 ENCOUNTER — Telehealth: Payer: Self-pay

## 2017-03-28 NOTE — Telephone Encounter (Signed)
Patient called left VM requesting  samples of basaglar. Patient came to office to pick up samples

## 2017-04-11 ENCOUNTER — Ambulatory Visit (INDEPENDENT_AMBULATORY_CARE_PROVIDER_SITE_OTHER): Payer: Medicare Other

## 2017-04-11 DIAGNOSIS — Z23 Encounter for immunization: Secondary | ICD-10-CM

## 2017-04-11 NOTE — Progress Notes (Signed)
Patient was seen in office for a flu vaccine Patient received the vaccine in left deltoid patient tolerated well

## 2017-04-16 ENCOUNTER — Other Ambulatory Visit: Payer: Self-pay | Admitting: Physician Assistant

## 2017-04-16 ENCOUNTER — Other Ambulatory Visit: Payer: Self-pay | Admitting: Cardiology

## 2017-04-25 ENCOUNTER — Ambulatory Visit (INDEPENDENT_AMBULATORY_CARE_PROVIDER_SITE_OTHER): Payer: Medicare Other | Admitting: Physician Assistant

## 2017-04-25 ENCOUNTER — Encounter: Payer: Self-pay | Admitting: Physician Assistant

## 2017-04-25 VITALS — BP 140/70 | HR 64 | Temp 97.6°F | Resp 14 | Ht 68.0 in | Wt 146.0 lb

## 2017-04-25 DIAGNOSIS — I6529 Occlusion and stenosis of unspecified carotid artery: Secondary | ICD-10-CM | POA: Diagnosis not present

## 2017-04-25 DIAGNOSIS — I10 Essential (primary) hypertension: Secondary | ICD-10-CM | POA: Diagnosis not present

## 2017-04-25 DIAGNOSIS — I420 Dilated cardiomyopathy: Secondary | ICD-10-CM

## 2017-04-25 DIAGNOSIS — E039 Hypothyroidism, unspecified: Secondary | ICD-10-CM

## 2017-04-25 DIAGNOSIS — Z794 Long term (current) use of insulin: Secondary | ICD-10-CM | POA: Diagnosis not present

## 2017-04-25 DIAGNOSIS — R0989 Other specified symptoms and signs involving the circulatory and respiratory systems: Secondary | ICD-10-CM

## 2017-04-25 DIAGNOSIS — D692 Other nonthrombocytopenic purpura: Secondary | ICD-10-CM

## 2017-04-25 DIAGNOSIS — Z952 Presence of prosthetic heart valve: Secondary | ICD-10-CM | POA: Diagnosis not present

## 2017-04-25 DIAGNOSIS — I359 Nonrheumatic aortic valve disorder, unspecified: Secondary | ICD-10-CM | POA: Diagnosis not present

## 2017-04-25 DIAGNOSIS — I5022 Chronic systolic (congestive) heart failure: Secondary | ICD-10-CM | POA: Diagnosis not present

## 2017-04-25 DIAGNOSIS — E785 Hyperlipidemia, unspecified: Secondary | ICD-10-CM

## 2017-04-25 DIAGNOSIS — E119 Type 2 diabetes mellitus without complications: Secondary | ICD-10-CM | POA: Diagnosis not present

## 2017-04-25 DIAGNOSIS — I350 Nonrheumatic aortic (valve) stenosis: Secondary | ICD-10-CM | POA: Diagnosis not present

## 2017-04-25 DIAGNOSIS — E038 Other specified hypothyroidism: Secondary | ICD-10-CM

## 2017-04-25 NOTE — Progress Notes (Signed)
Patient ID: [name] MRN: [id number], DOB: April 28, 1941, 76 y.o. Date of Encounter: _0 @  Chief Complaint:  Chief Complaint  Patient presents with  . 3 month follow up    HPI: 76 y.o. year old white male  presents today for routine followup office visit.   On 04/13/2014 he had aortic valve replacement secondary to severe aortic stenosis.  Had Normal coronaries by cardiac catheterization. Preoperatively he had low EF.  At his office visit with me 05/26/14, pt stated that they did give him metformin in the hospital, but said that he had not been taking it since he got home because his blood sugars were low.  He stated that even off of Metformin, his fasting morning blood sugars had been around 138 and 99. At that visit I told him to stay off of metformin. I reviewed the fact that he had experienced further weight loss with the hospitalization and surgery--I'm sure this is contributing to his decreased sugar.  AT OV 12/2014: He states that he checks his blood sugar about once a week. Sometimes fasting or sometimes during the day.  Says this morning fasting reading was 130. Says that he usually gets around 100 to 130 for fasting readings.  States that he takes his dog for a walk every morning. They usually go around the block and then back around. Says that he feels pretty good with this exertion.  He is taking medications as directed with no adverse effects. No lightheadedness. No presyncope.  His last office visit with me prior to the hospitlaization was 02/22/2014. The following is copied from that office visit note.:  At his routine OV 09/28/2013--at that visit I noticed that he looked even thinner that day than usual. I then noticed that I had noted at his prior visit with me 06/29/13 that he had also lost weight at the time of that visit. At that visit , I initially assumed that his weight loss was intentional  secondary to dietary changes to help control his diabetes.    However, at that visit 06/29/13 he reported that he decrease his dietary intake secondary to finances. At that visit he stated   "I just have to eat what I have.   I can't afford anything more."  Weight February 2014   was 167. Weight 06/29/13             was 155. Weight 09/28/13               was 147.  09/28/13---checked labs to evaluate underlying medical causes of weight loss including CBC TSH and CME T. These were all normal.  We also discussed the fact he had a screening colonoscopy 06/03/2009 and he was told to repeat 10-15 years.  He did come in for weight check and his weight was stable at that time. Say when he came in for that we checked our staff gave him some contact information --he could call for some assistance.  At [place] 09/28/13 he says that he is using [employer] Says that they get him through to every other Thursday. He gives very pick this up. Says that he has other phone numbers that he could call for food if he needs to but hasn't needed to so far.  He had colonoscopy by Dr. Jearld Adjutant on 12/12/13. There were polyps which were biopsied. External and internal hemorrhoids. At that time Dr. Wonda Amis planned to order CT of abdomen and pelvis as well as chest.  He  had a CT scan on 12/09/13. No malignancy was seen on either CT scan. No significant finding seen on the CT scans.  Today he says that he continues to  get food once a month from 4 or 5 different places--- [employer] out reach, [employer], [employer], their church.  Weight 12/29/14 remains stable at 146.  At visit 12/29/14 I also reviewed last cardiology note.  Saw NP at Cardiology 11/22/14. Things were stable at that visit and he was to continue current medications. No labs or tests were indicated/ ordered at that time and they plan for follow-up 6 months.  04/04/2015:  He reports that sometimes when he first stands up, his left "hip" "is stiff--takes a minute to straighten up". Says there is no pain, just  stiffness.  Does not want to pursue imaging, further eval right now sec to finances--will monitor. No other complaints or concerns. Has been feeling pretty good. Still checks blood sugar once a week--fasting---gets 120s, 133, "something like that"  10/05/2015: He has no complaints during the visit. Reports he had his follow-up carotid artery ultrasound on Monday but has not heard results yet. He is taking cholesterol medication as directed. No myalgias or other adverse effects. He is taking blood pressure medications as directed with no adverse effects. When he gets up off of the exam table to go to the lab, he moves very slowly and stiff holding to his left hip. Asked what area was bothering him he points to the anterior aspect of the left hip.  10/24/2015: At [place] 10/05/2015--A1C came back high at 8.9. 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  When I got that lab result, told him to start Metformin 1,040m BID and check fasting BS QAM and come for f/u OV 2 weeks.  Today he states that he is taking the metformin 1000 mg twice a day as directed. Says it is causing him quite a bit of gas and some heartburn. Says that it's only causing a little bit of diarrhea. Says he takes a Tums and that helps the heartburn. Then adds that these symptoms were worse when he first started the Metformin and that the symptoms are getting better.  He does bring in blood sugar log sheet. Fasting morning readings have been 167, 129, 137, 125, 115, 138, 108, 136, 111, 109, 143, 145, 114, 130.  12/22/2015--- Phone message on this date that patient called reporting having diarrhea with the metformin 1000 twice a day. At time he was told to decrease down to 500 mg twice a day.  04/16/2016: Pt reports that he has decreased the metformin down to 500 mg once a day. Says he still has some diarrhea even with this low dose. He checks his blood sugar about twice a week. Says yesterday he got 158 and that was one or 2 hours after he  ate. No other complaints or concerns today. That he is feeling good. Taking blood pressure medication as directed with no lightheadedness or other adverse effects. Taking pravastatin with no myalgias or other adverse effects.   09/27/2016: Today he came in for office visit because "high blood sugar readings". This was not a routine f/u OV. (I dont know why he is overdue for ROV / why he hasnot come in for ROV prior to today) States that he has been getting readings like 350 and 320 and says that these are fasting morning readings. Says he has been getting these kinds of readings for about 2 months now.  I reviewed his Lab from 04/2016. At that time A1c was performed. At that time I had said to add Glucotrol.  Today I asked if he was taking that as directed. He says that caused diarrhea so he is not taking it. I reviewed that  the metformin caused diarrhea in the past but asked if he was confusing this with metformin and he says no- the Glucotrol also cause diarrhea so he quit it. Now he still has some diarrhea but that it was worse with the Glucotrol. ASSESSMENT/PLAN AT THAT OV: 1. Type 2 diabetes mellitus without complication, without long-term current use of insulin (HCC) At this time Blood Sugar reading 236 and A1c 11.0 He cannot take Metformin, Actos, Glucotrol. Is not going to be able to afford multiple medications. At this time will add basal insulin. Staff has shown him how to do this and he has administered first dose here today. He is given 10 units. To then titrate up 1 unit each day until he gets to 16 units. He will then stop/ pause at 16 units. Given him a blood sugar log sheet and he is to document fasting morning readings. Is scheduling follow-up visit with me for this upcoming Monday. Bring the log sheet with him to that appointment. - Hemoglobin A1C, fingerstick - Glucose, fingerstick (stat) - Insulin Glargine (LANTUS SOLOSTAR) 100 UNIT/ML Solostar Pen; Give # units as directed at  office visit, up to 30 units daily.  Dispense: 5 pen; Refill: PRN   10/01/2016: Today he does bring in his blood sugar log sheet that we gave him at last office visit. He does have documented that he has been increasing number of units of insulin by 1 each day since that visit. Also has documented that his fasting blood sugars have been (in this chronological order according to the date): 285, 200, 267, 198. Today also reviewed the fact that he is unable to take Actos because of his cardiac issues. Can not take metformin or glipizide because each of these causes diarrhea. Verified with him again today that his diarrhea does get better when he stops the glipizide. Says that his Insulin is going to cost > $200  Assessment/Plan at that OV: 1. Type 2 diabetes mellitus without complication, without long-term current use of insulin ([place]) Today I had Maudie Mercury talk to him regarding finances and assistance that may be available to help with this as well as finding out which insulin will have the lowest co-pay with his insurance coverage. Today I have told him to continue increasing the insulin by 1 unit per day until he gets fasting sugars reading around 150. When sugar reading around 150 then he will pause and continue that number of units. At this time will add Januvia 100 mg once daily. Renal function was normal on last lab. He will continue to document fasting sugars every morning. He will have follow-up visit with me in one week.   10/08/2016: He states that he is taking the Januvia 100 mg daily. He got it at the local pharmacy and is waiting for the mail order to come in. He will pay $125 for 90 day supply. Currently using the sample of the Basaglar that we gave him at last visit. Says that he has sent in for the mail order and that his wife said it supposed to be free. Has no questions or concerns today. Having no adverse effects with the medicines.  Does bring in his blood sugar log form. Has been  increasing by 1 unit each day. The last 6 fasting blood sugar readings have been 240, 182, 158, 176, 276, 173. He is currently at 17 units daily. A/P at that OV: Today I gave him 4 weeks worth of samples of the Januvia 100 mg daily. Today I gave him samples of 2 more of the [employer] I have written # units daily on his BS log sheet---He is to continue increasing by 1 unit daily until he gets to 20 units and then we will continue 20 units daily. Will continue on the 20 units daily until he has follow-up visit in 1-2 weeks. Bring the blood sugar log form with him to that visit. F/U sooner if needed if any questions or concerns.   Addendum added 10/10/2016:  Patient's wife her for OV with me. Reports that Januvia is causing husband to have diarrhea. I checked for samples of the 41m dose so he could try this dose but we have no Januvia 548msamples. Therefore told her to have him just stop the Januvia until his follow-up visit which she says is scheduled in just a couple weeks.  10/24/2016: He reports that he did stop the Januvia. The diarrhea did resolve.  He did increase Insulin by one unit until he got to 20 units and has continued 20 units daily since.  He does bring in BS log-- Fasting morning readings since getting to 20 units are : 189, 178, 171, 161, 143, 142, 162, 140, 178, 140, 148. A/P at this OV: Will have him increase to 22 units daily and stay at that dose.  I checked for samples of Januvia 5030mgain today but we have none.  At this time will D/C Januvia and try Jardiance 48m57m 2 boxes of samples given (total 14 tablets)    01/24/2017: Today he reports he has been feeling very well. Is that his granddaughter is living with them for the summer and that she is looking into joining the Y and he is asking about whether be okay for him to swim if they joined the Y. I told him that that would be excellent exercise for him and highly encouraged him to do this. Told him to just be careful  not to hit his toes or feet but otherwise encouraged him to do that and for him to see if there are any classes offered especially senior classes for water aerobics.  He is currently taking his insulin at 22 units daily. He states that financially it is difficult to have the money for every prescriptions that he has been coming here and getting some samples. As well today I have given him 4 sample pens of Basaglar.  He brings in his blood sugar log. He only checks fasting morning readings but he does check this every day and knees are excellent. For the month of June since June 1 he has been getting the following readings: 114, 112, 153, 120, 108, 105, 166, 113, 125, 135, 120, 139, 198, 137, 118, 139, 149.  I did review telephone notes that are documented from 11/05/2016. This documents that Jardiance -- for him was going to cost over $100 co-pay and that was too expensive. I then reviewed the fact that he cannot tolerate metformin, Actos, Glucotrol. Can not afford Januvia secondary to cost either. Therefore plan to just have him take insulin with no oral medicine for diabetes. Patient was informed of this. He also was [facility]  to get assistance for pain for his medicines.  He is taking blood pressure medications as directed. Having no lightheadedness or other adverse effects.  He is taking his pravastatin daily. No myalgias or right upper quadrant pain.  He has no concerns to address today.   04/25/2017: Today he reports that he has continued to feel good. States that he does occasionally get some minor discomfort going down the lateral aspect of his right leg. Discussed with him that this is consistent with right sciatica. Says that it really doesn't hurt bad just something a little annoying every now and then. Comes and goes intermittently. Discussed some stretches that he could do if this does flare and follow-up if it worsens. Otherwise, has been  feeling good. He is using his insulin as directed. Did not bring in blood sugar log form for me to review today. Continues to be very careful with his feet and provide excellent foot care. Still cannot afford the co-pay to go to for eye exam. He is taking blood pressure medications as directed. Having no lightheadedness or other adverse effects. He is taking his pravastatin daily. No myalgias or right upper quadrant pain.    Past Medical History:  Diagnosis Date  . Aortic stenosis, mild   . BPH (benign prostatic hypertrophy)   . CAD (coronary artery disease)   . 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 Constipation   . Diabetes mellitus   . GERD (gastroesophageal reflux disease)   . History of cataract   . Hyperlipidemia   . Hypertension   . Nephrolithiasis   . Vitamin D deficiency      Home Meds: Outpatient Medications Prior to Visit  Medication Sig Dispense Refill  . aspirin 81 MG EC tablet Take 1 tablet (81 mg total) by mouth daily. Swallow whole. 30 tablet 12  . Blood Glucose Monitoring Suppl (ONE TOUCH ULTRA SYSTEM KIT) w/Device KIT Check blood sugar twice daily. 1 each 0  . Cholecalciferol (VITAMIN D) 2000 UNITS tablet Take 2,000 Units by mouth daily.    . fish oil-omega-3 fatty acids 1000 MG capsule Take 1 g by mouth daily.     Marland Kitchen glucose blood test strip Check blood sugar (2) twice daily. 100 each 12  . Insulin Glargine (BASAGLAR KWIKPEN) 100 UNIT/ML SOPN Give number of units as directed at office visit, up to 30 units daily. (Patient taking differently: 22 Units. Give number of units as directed at office visit, up to 30 units daily.) 15 mL 11  . Insulin Pen Needle (BD PEN NEEDLE NANO U/F) 32G X 4 MM MISC Use to check blood sugar daily 90 each 2  . Lancets (ONETOUCH ULTRASOFT) lancets Use as instructed 100 each 12  . lisinopril (PRINIVIL,ZESTRIL) 2.5 MG tablet TAKE 1 TABLET BY MOUTH  DAILY 90 tablet 1  . metoprolol succinate (TOPROL-XL) 50 MG 24 hr tablet TAKE 1  TABLET BY MOUTH  DAILY WITH OR IMMEDIATELY  FOLLOWING A MEAL 90 tablet 3  . omeprazole (PRILOSEC) 20 MG capsule TAKE 1 CAPSULE BY MOUTH  DAILY 90 capsule 3  . pravastatin (PRAVACHOL) 40 MG tablet TAKE 1 TABLET BY MOUTH  DAILY 90 tablet 0   No facility-administered medications prior to visit.      Allergies:  Allergies  Allergen Reactions  . Actos [Pioglitazone] Other (See Comments)    Cramps in legs   . Metformin And Related Diarrhea  . Morphine     REACTION: hives  .  Statins     REACTION: leg cramps but tolerates pravastatin    Social History   Social History  . Marital status: Married    Spouse name: N/A  . Number of children: N/A  . Years of education: N/A   Occupational History  . Not on file.   Social History Main Topics  . Smoking status: Former Smoker    Packs/day: 1.00    Years: 30.00    Quit date: 06/10/1999  . Smokeless tobacco: Former Systems developer    Types: [place] date: 04/09/2014     Comment: Quit >15 years ago  . Alcohol use No  . Drug use: No  . Sexual activity: Not Currently   Other Topics Concern  . Not on file   Social History Narrative   Married for >44 years    Family History  Problem Relation Age of Onset  . Heart disease Mother   . Congestive Heart Failure Father   . Heart failure Sister 69     Review of Systems:  See HPI for pertinent ROS. All other ROS negative.    Physical Exam: Blood pressure 140/70, pulse 64, temperature 97.6 F (36.4 C), temperature source Oral, resp. rate 14, height '5\' 8"'$  (1.727 m), weight 66.2 kg (146 lb), SpO2 98 %., Body mass index is 22.2 kg/m. General: Thin WM. Appears in no acute distress. Neck: Bilateral Carotid Bruits.  Lungs: Crackles at bases bilaterally. O/w clear.  Heart: RRR with S1 S2. I/VI murmur at 2nd ICS on Right Musculoskeletal:  Strength and tone normal for age. Extremities/Skin: Warm and dry.  No edema.  Forearms with multiple scattered areas of deep reddish-purplish purpura.  Neuro:  Alert and oriented X 3. Moves all extremities spontaneously. Gait is normal. CNII-XII grossly in tact. Psych:  Responds to questions appropriately with a normal affect. Diabetic Foot Exam: Inspection: Normal. No wounds, no callouses, worisome areas. Sensation intact. 1+-2+ DP pulses bilaterally. Trace PT pulses bilaterally.      ASSESSMENT AND PLAN:  76 y.o. year old male with   1. Type 2 diabetes mellitus without complication, with long-term current use of insulin ([place])  01/24/2017: I did review telephone notes that are documented from 11/05/2016. This documents that Jardiance -- for him was going to cost over $100 co-pay and that was too expensive. I then reviewed the fact that he cannot tolerate metformin, Actos, Glucotrol. Can not afford Januvia secondary to cost either. Therefore plan to just have him take insulin with no oral medicine for diabetes. Patient was informed of this. He also was given a number to [facility] where he can call to get assistance for pain for his medicines.  01/24/2017: Discussed with him that his fasting blood sugar readings are excellent. Continue insulin at 22 units daily. At today's visit I gave him 4 or 5 boxes of Basaglar Check A1c to monitor. Encouraged him to swim at the Y see history of present illness.  04/25/2017: Check MicroAlbumin, A1C  He provides excellent foot care. Reminded him of proper foot care and wearing shoes at all times.   He is overdue for ophthalmology exam. He states that he cannot afford this. He has a high co-pay.   He is on ASA '81mg'$ , on ACE inhibitor, and on statin therapy.     Subclinical hypothyroidism 01/24/2017: Check labs to monitor - TSH - T4, free 04/26/2017---Labs were stable when checked 01/24/17. Wait to monitor with further labs in future.  Osteopenia determined by x-ray Prior  X-ray showed osteopenia. Patient has never had bone density scan. Discussed having bone density scan and he is agreeable. - DG Bone  Density; Future Addendum Added 11/14/15--- DEXA shows Osteoporosis.  Pt to start Fosamax '70mg'$  once weekly.  Osteoporosis 01/24/2017: --S/P DEXA 11/2015 01/24/2017: --Started Fosamax 11/14/2015  04/25/2017--continue the Fosamax.  S/P AVR (aortic valve replacement) 04/25/2017: --Managed by Cardiology  Cardiomyopathy, dilated, nonischemic 04/25/2017: -Managed by Cardiology  Carotid artery stenosis, bilateral In the past, cardiology was ordering his carotid artery ultrasounds. However, patient had stopped following up with cardiology as he could not afford to pay the co-pay. Because of this financial situation, I  Ordered Echo and carotid artery Dopplers more recently.  And I ordered another set of carotid Dopplers which was performed on 10/01/14. Stable. 60-79% bilateral ICA stenosis. Follow-up one year. --F/U performed 09/2015:  Stable. 60-79% bilateral ICA stenosis. Follow-up one year.  01/24/2017:  He had follow-up carotid artery ultrasound 11/12/16. I did send a message to Dr. Harl Bowie and he did follow-up with me and verified that recommendation a follow-up in one year. Repeat carotid ultrasound one year. Bilateral carotid bruits - see  above     Hyperlipidemia 01/24/2017: On pravastatin. Check FLP LFT now. He is fasting. 04/25/17 he is on pravastatin. Labs were excellent 01/2017. Continue current dose and will wait to recheck labs next visit.  Essential hypertension 04/25/2017:Blood pressure is at goal. Continue current medications. On ACE inhibitor and beta blocker.   Weight Loss Has had evaluation by myself as well as Dr. Wonda Amis. Also, at [place] 04/04/2015---Realized that he has no lower teeth and that those dentures have been broken for long time and he cannot afford new ones--he says he has to eat soft foods.  04/25/2017---Weight is stable.  CONSTIPATION  04/25/2017---In past was treating with Linzess, Mirilax Not a current problem.   Purpura senilis Reassured him that everyone as  they age that her skin becomes thinner and bruises easily. Also he is on aspirin 81 mg daily and this is also contributing to this.  BENIGN PROSTATIC HYPERTROPHY, WITH OBSTRUCTION  A screening PSA was done 09/25/12 and was normal. He is now 76 years old so can stop doing screening PSA.  04/25/2017---No further screening PSA given advanced age  Screening colonoscopy: 06/03/2009. Repeat 10-15 years. Had Colonoscopy --Dr. Rehman--11/26/13--Polyps. Internal and External Hemorrhoids.   Immunizations:  Pneumonia vaccine: Patient reports that he had this at age 13 his prior PCP  .--He would have received  Pneumovax 23.   Prevnar  13 given here 09/02/14.  Tetanus vaccine: Patient recently checked with his insurance and is going to cost him $40 and he deferred.  Zostavax: Patient check with his insurance. Too expensive. The patient defers.  Influenza vaccine: Received this 04/20/14, Has had 04/2017   F/U 3 months, sooner if needed.   Signed,     Signed, [address] [place], [place], [facility] 04/25/2017 10:34 AM

## 2017-04-26 LAB — HEMOGLOBIN A1C
EAG (MMOL/L): 9.5 (calc)
HEMOGLOBIN A1C: 7.6 %{Hb} — AB (ref <5.7)
Mean Plasma Glucose: 171 (calc)

## 2017-04-26 LAB — MICROALBUMIN, URINE: MICROALB UR: 3 mg/dL

## 2017-05-23 ENCOUNTER — Ambulatory Visit (INDEPENDENT_AMBULATORY_CARE_PROVIDER_SITE_OTHER): Payer: Medicare Other | Admitting: Cardiology

## 2017-05-23 ENCOUNTER — Encounter: Payer: Self-pay | Admitting: Cardiology

## 2017-05-23 VITALS — BP 154/78 | HR 71 | Ht 68.0 in | Wt 149.0 lb

## 2017-05-23 DIAGNOSIS — I6523 Occlusion and stenosis of bilateral carotid arteries: Secondary | ICD-10-CM | POA: Diagnosis not present

## 2017-05-23 DIAGNOSIS — I2510 Atherosclerotic heart disease of native coronary artery without angina pectoris: Secondary | ICD-10-CM | POA: Diagnosis not present

## 2017-05-23 DIAGNOSIS — I350 Nonrheumatic aortic (valve) stenosis: Secondary | ICD-10-CM | POA: Diagnosis not present

## 2017-05-23 DIAGNOSIS — I10 Essential (primary) hypertension: Secondary | ICD-10-CM | POA: Diagnosis not present

## 2017-05-23 DIAGNOSIS — E782 Mixed hyperlipidemia: Secondary | ICD-10-CM | POA: Diagnosis not present

## 2017-05-23 MED ORDER — PRAVASTATIN SODIUM 80 MG PO TABS: 80.0000 mg | ORAL_TABLET | Freq: Every evening | ORAL | 0 refills | Status: DC

## 2017-05-23 NOTE — Patient Instructions (Signed)
Medication Instructions:  Increase pravastatin to 28m mg daily   Labwork: none  Testing/Procedures: none  Follow-Up: Your physician wants you to follow-up in: 6 months .  You will receive a reminder letter in the mail two months in advance. If you don't receive a letter, please call our office to schedule the follow-up appointment.  Any Other Special Instructions Will Be Listed Below (If Applicable).     If you need a refill on your cardiac medications before your next appointment, please call your pharmacy.
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
## 2017-05-24 ENCOUNTER — Telehealth: Payer: Self-pay | Admitting: Cardiology

## 2017-05-24 MED ORDER — PRAVASTATIN SODIUM 40 MG PO TABS: 40.0000 mg | ORAL_TABLET | Freq: Every evening | ORAL | 3 refills | Status: DC

## 2017-05-24 NOTE — Telephone Encounter (Signed)
Pravachol reduced to 40 mg daily because leg pain

## 2017-05-24 NOTE — Telephone Encounter (Signed)
Patient states that increase of cholesterol med from [place] yesterday is causing him to have cramps. / tg

## 2017-05-24 NOTE — Telephone Encounter (Signed)
Pravastatin increased to 80 mg yesterday, will forward to Dr.Branch

## 2017-05-24 NOTE — Telephone Encounter (Signed)
Can go back to prava 40mg  daily  Zandra Abts MD

## 2017-05-27 ENCOUNTER — Other Ambulatory Visit: Payer: Self-pay

## 2017-05-27 MED ORDER — PRAVASTATIN SODIUM 40 MG PO TABS: 40.0000 mg | ORAL_TABLET | Freq: Every evening | ORAL | 3 refills | Status: DC

## 2017-05-27 NOTE — Telephone Encounter (Signed)
Refilled pravastatin per request

## 2017-05-30 ENCOUNTER — Other Ambulatory Visit: Payer: Self-pay | Admitting: *Deleted

## 2017-05-30 DIAGNOSIS — I6523 Occlusion and stenosis of bilateral carotid arteries: Secondary | ICD-10-CM

## 2017-06-17 ENCOUNTER — Telehealth: Payer: Self-pay | Admitting: Physician Assistant

## 2017-06-17 MED ORDER — BASAGLAR KWIKPEN 100 UNIT/ML ~~LOC~~ SOPN: 22.0000 [IU] | PEN_INJECTOR | Freq: Every day | SUBCUTANEOUS | 3 refills | Status: DC

## 2017-06-17 NOTE — Telephone Encounter (Signed)
Basaglar sent to [facility]

## 2017-06-17 NOTE — Telephone Encounter (Signed)
Patient calling to get refill on his basaglar

## 2017-06-24 ENCOUNTER — Ambulatory Visit (HOSPITAL_COMMUNITY)
Admission: RE | Admit: 2017-06-24 | Discharge: 2017-06-24 | Disposition: A | Discharge status: Home / Self Care | Payer: Medicare Other | Source: Ambulatory Visit | Attending: Internal Medicine | Admitting: Internal Medicine

## 2017-06-24 DIAGNOSIS — I6523 Occlusion and stenosis of bilateral carotid arteries: Secondary | ICD-10-CM

## 2017-07-01 ENCOUNTER — Telehealth: Payer: Self-pay | Admitting: Physician Assistant

## 2017-07-01 NOTE — Telephone Encounter (Signed)
Pt is out of his basalgar, wants to know if we have samples of something else he can use. He is out and cannot afford it.

## 2017-07-01 NOTE — Telephone Encounter (Signed)
Patient is aware we have toujeo available that he can pick up

## 2017-07-08 ENCOUNTER — Other Ambulatory Visit: Payer: Self-pay | Admitting: Cardiology

## 2017-07-25 ENCOUNTER — Other Ambulatory Visit: Payer: Self-pay

## 2017-07-25 ENCOUNTER — Ambulatory Visit (INDEPENDENT_AMBULATORY_CARE_PROVIDER_SITE_OTHER): Payer: Medicare Other | Admitting: Physician Assistant

## 2017-07-25 ENCOUNTER — Encounter: Payer: Self-pay | Admitting: Physician Assistant

## 2017-07-25 VITALS — BP 130/70 | HR 72 | Temp 97.6°F | Resp 14 | Wt 150.4 lb

## 2017-07-25 DIAGNOSIS — Z952 Presence of prosthetic heart valve: Secondary | ICD-10-CM

## 2017-07-25 DIAGNOSIS — I5022 Chronic systolic (congestive) heart failure: Secondary | ICD-10-CM

## 2017-07-25 DIAGNOSIS — E119 Type 2 diabetes mellitus without complications: Secondary | ICD-10-CM

## 2017-07-25 DIAGNOSIS — E039 Hypothyroidism, unspecified: Secondary | ICD-10-CM | POA: Diagnosis not present

## 2017-07-25 DIAGNOSIS — M810 Age-related osteoporosis without current pathological fracture: Secondary | ICD-10-CM | POA: Diagnosis not present

## 2017-07-25 DIAGNOSIS — Z794 Long term (current) use of insulin: Secondary | ICD-10-CM

## 2017-07-25 DIAGNOSIS — I10 Essential (primary) hypertension: Secondary | ICD-10-CM | POA: Diagnosis not present

## 2017-07-25 DIAGNOSIS — I350 Nonrheumatic aortic (valve) stenosis: Secondary | ICD-10-CM | POA: Diagnosis not present

## 2017-07-25 DIAGNOSIS — E785 Hyperlipidemia, unspecified: Secondary | ICD-10-CM

## 2017-07-25 DIAGNOSIS — E038 Other specified hypothyroidism: Secondary | ICD-10-CM

## 2017-07-25 NOTE — Progress Notes (Signed)
Patient ID: [name] MRN: [id number], DOB: April 28, 1941, 76 y.o. Date of Encounter: _0 @  Chief Complaint:  Chief Complaint  Patient presents with  . 3 month follow up    HPI: 76 y.o. year old white male  presents today for routine followup office visit.   On 04/13/2014 he had aortic valve replacement secondary to severe aortic stenosis.  Had Normal coronaries by cardiac catheterization. Preoperatively he had low EF.  At his office visit with me 05/26/14, pt stated that they did give him metformin in the hospital, but said that he had not been taking it since he got home because his blood sugars were low.  He stated that even off of Metformin, his fasting morning blood sugars had been around 138 and 99. At that visit I told him to stay off of metformin. I reviewed the fact that he had experienced further weight loss with the hospitalization and surgery--I'm sure this is contributing to his decreased sugar.  AT OV 12/2014: He states that he checks his blood sugar about once a week. Sometimes fasting or sometimes during the day.  Says this morning fasting reading was 130. Says that he usually gets around 100 to 130 for fasting readings.  States that he takes his dog for a walk every morning. They usually go around the block and then back around. Says that he feels pretty good with this exertion.  He is taking medications as directed with no adverse effects. No lightheadedness. No presyncope.  His last office visit with me prior to the hospitlaization was 02/22/2014. The following is copied from that office visit note.:  At his routine OV 09/28/2013--at that visit I noticed that he looked even thinner that day than usual. I then noticed that I had noted at his prior visit with me 06/29/13 that he had also lost weight at the time of that visit. At that visit , I initially assumed that his weight loss was intentional  secondary to dietary changes to help control his diabetes.    However, at that visit 06/29/13 he reported that he decrease his dietary intake secondary to finances. At that visit he stated   "I just have to eat what I have.   I can't afford anything more."  Weight February 2014   was 167. Weight 06/29/13             was 155. Weight 09/28/13               was 147.  09/28/13---checked labs to evaluate underlying medical causes of weight loss including CBC TSH and CME T. These were all normal.  We also discussed the fact he had a screening colonoscopy 06/03/2009 and he was told to repeat 10-15 years.  He did come in for weight check and his weight was stable at that time. Say when he came in for that we checked our staff gave him some contact information --he could call for some assistance.  At [place] 09/28/13 he says that he is using [employer] Says that they get him through to every other Thursday. He gives very pick this up. Says that he has other phone numbers that he could call for food if he needs to but hasn't needed to so far.  He had colonoscopy by Dr. Jearld Adjutant on 12/12/13. There were polyps which were biopsied. External and internal hemorrhoids. At that time Dr. Wonda Amis planned to order CT of abdomen and pelvis as well as chest.  He  had a CT scan on 12/09/13. No malignancy was seen on either CT scan. No significant finding seen on the CT scans.  Today he says that he continues to  get food once a month from 4 or 5 different places--- [employer] out reach, [employer], [employer], their church.  Weight 12/29/14 remains stable at 146.  At visit 12/29/14 I also reviewed last cardiology note.  Saw NP at Cardiology 11/22/14. Things were stable at that visit and he was to continue current medications. No labs or tests were indicated/ ordered at that time and they plan for follow-up 6 months.  04/04/2015:  He reports that sometimes when he first stands up, his left "hip" "is stiff--takes a minute to straighten up". Says there is no pain, just  stiffness.  Does not want to pursue imaging, further eval right now sec to finances--will monitor. No other complaints or concerns. Has been feeling pretty good. Still checks blood sugar once a week--fasting---gets 120s, 133, "something like that"  10/05/2015: He has no complaints during the visit. Reports he had his follow-up carotid artery ultrasound on Monday but has not heard results yet. He is taking cholesterol medication as directed. No myalgias or other adverse effects. He is taking blood pressure medications as directed with no adverse effects. When he gets up off of the exam table to go to the lab, he moves very slowly and stiff holding to his left hip. Asked what area was bothering him he points to the anterior aspect of the left hip.  10/24/2015: At [place] 10/05/2015--A1C came back high at 8.9. 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  When I got that lab result, told him to start Metformin 1,040m BID and check fasting BS QAM and come for f/u OV 2 weeks.  Today he states that he is taking the metformin 1000 mg twice a day as directed. Says it is causing him quite a bit of gas and some heartburn. Says that it's only causing a little bit of diarrhea. Says he takes a Tums and that helps the heartburn. Then adds that these symptoms were worse when he first started the Metformin and that the symptoms are getting better.  He does bring in blood sugar log sheet. Fasting morning readings have been 167, 129, 137, 125, 115, 138, 108, 136, 111, 109, 143, 145, 114, 130.  12/22/2015--- Phone message on this date that patient called reporting having diarrhea with the metformin 1000 twice a day. At time he was told to decrease down to 500 mg twice a day.  04/16/2016: Pt reports that he has decreased the metformin down to 500 mg once a day. Says he still has some diarrhea even with this low dose. He checks his blood sugar about twice a week. Says yesterday he got 158 and that was one or 2 hours after he  ate. No other complaints or concerns today. That he is feeling good. Taking blood pressure medication as directed with no lightheadedness or other adverse effects. Taking pravastatin with no myalgias or other adverse effects.   09/27/2016: Today he came in for office visit because "high blood sugar readings". This was not a routine f/u OV. (I dont know why he is overdue for ROV / why he hasnot come in for ROV prior to today) States that he has been getting readings like 350 and 320 and says that these are fasting morning readings. Says he has been getting these kinds of readings for about 2 months now.  I reviewed his Lab from 04/2016. At that time A1c was performed. At that time I had said to add Glucotrol.  Today I asked if he was taking that as directed. He says that caused diarrhea so he is not taking it. I reviewed that  the metformin caused diarrhea in the past but asked if he was confusing this with metformin and he says no- the Glucotrol also cause diarrhea so he quit it. Now he still has some diarrhea but that it was worse with the Glucotrol. ASSESSMENT/PLAN AT THAT OV: 1. Type 2 diabetes mellitus without complication, without long-term current use of insulin (HCC) At this time Blood Sugar reading 236 and A1c 11.0 He cannot take Metformin, Actos, Glucotrol. Is not going to be able to afford multiple medications. At this time will add basal insulin. Staff has shown him how to do this and he has administered first dose here today. He is given 10 units. To then titrate up 1 unit each day until he gets to 16 units. He will then stop/ pause at 16 units. Given him a blood sugar log sheet and he is to document fasting morning readings. Is scheduling follow-up visit with me for this upcoming Monday. Bring the log sheet with him to that appointment. - Hemoglobin A1C, fingerstick - Glucose, fingerstick (stat) - Insulin Glargine (LANTUS SOLOSTAR) 100 UNIT/ML Solostar Pen; Give # units as directed at  office visit, up to 30 units daily.  Dispense: 5 pen; Refill: PRN   10/01/2016: Today he does bring in his blood sugar log sheet that we gave him at last office visit. He does have documented that he has been increasing number of units of insulin by 1 each day since that visit. Also has documented that his fasting blood sugars have been (in this chronological order according to the date): 285, 200, 267, 198. Today also reviewed the fact that he is unable to take Actos because of his cardiac issues. Can not take metformin or glipizide because each of these causes diarrhea. Verified with him again today that his diarrhea does get better when he stops the glipizide. Says that his Insulin is going to cost > $200  Assessment/Plan at that OV: 1. Type 2 diabetes mellitus without complication, without long-term current use of insulin ([place]) Today I had Maudie Mercury talk to him regarding finances and assistance that may be available to help with this as well as finding out which insulin will have the lowest co-pay with his insurance coverage. Today I have told him to continue increasing the insulin by 1 unit per day until he gets fasting sugars reading around 150. When sugar reading around 150 then he will pause and continue that number of units. At this time will add Januvia 100 mg once daily. Renal function was normal on last lab. He will continue to document fasting sugars every morning. He will have follow-up visit with me in one week.   10/08/2016: He states that he is taking the Januvia 100 mg daily. He got it at the local pharmacy and is waiting for the mail order to come in. He will pay $125 for 90 day supply. Currently using the sample of the Basaglar that we gave him at last visit. Says that he has sent in for the mail order and that his wife said it supposed to be free. Has no questions or concerns today. Having no adverse effects with the medicines.  Does bring in his blood sugar log form. Has been  increasing by 1 unit each day. The last 6 fasting blood sugar readings have been 240, 182, 158, 176, 276, 173. He is currently at 17 units daily. A/P at that OV: Today I gave him 4 weeks worth of samples of the Januvia 100 mg daily. Today I gave him samples of 2 more of the [employer] I have written # units daily on his BS log sheet---He is to continue increasing by 1 unit daily until he gets to 20 units and then we will continue 20 units daily. Will continue on the 20 units daily until he has follow-up visit in 1-2 weeks. Bring the blood sugar log form with him to that visit. F/U sooner if needed if any questions or concerns.   Addendum added 10/10/2016:  Patient's wife her for OV with me. Reports that Januvia is causing husband to have diarrhea. I checked for samples of the 41m dose so he could try this dose but we have no Januvia 548msamples. Therefore told her to have him just stop the Januvia until his follow-up visit which she says is scheduled in just a couple weeks.  10/24/2016: He reports that he did stop the Januvia. The diarrhea did resolve.  He did increase Insulin by one unit until he got to 20 units and has continued 20 units daily since.  He does bring in BS log-- Fasting morning readings since getting to 20 units are : 189, 178, 171, 161, 143, 142, 162, 140, 178, 140, 148. A/P at this OV: Will have him increase to 22 units daily and stay at that dose.  I checked for samples of Januvia 5030mgain today but we have none.  At this time will D/C Januvia and try Jardiance 48m57m 2 boxes of samples given (total 14 tablets)    01/24/2017: Today he reports he has been feeling very well. Is that his granddaughter is living with them for the summer and that she is looking into joining the Y and he is asking about whether be okay for him to swim if they joined the Y. I told him that that would be excellent exercise for him and highly encouraged him to do this. Told him to just be careful  not to hit his toes or feet but otherwise encouraged him to do that and for him to see if there are any classes offered especially senior classes for water aerobics.  He is currently taking his insulin at 22 units daily. He states that financially it is difficult to have the money for every prescriptions that he has been coming here and getting some samples. As well today I have given him 4 sample pens of Basaglar.  He brings in his blood sugar log. He only checks fasting morning readings but he does check this every day and knees are excellent. For the month of June since June 1 he has been getting the following readings: 114, 112, 153, 120, 108, 105, 166, 113, 125, 135, 120, 139, 198, 137, 118, 139, 149.  I did review telephone notes that are documented from 11/05/2016. This documents that Jardiance -- for him was going to cost over $100 co-pay and that was too expensive. I then reviewed the fact that he cannot tolerate metformin, Actos, Glucotrol. Can not afford Januvia secondary to cost either. Therefore plan to just have him take insulin with no oral medicine for diabetes. Patient was informed of this. He also was [facility]  to get assistance for pain for his medicines.  He is taking blood pressure medications as directed. Having no lightheadedness or other adverse effects.  He is taking his pravastatin daily. No myalgias or right upper quadrant pain.  He has no concerns to address today.   04/25/2017: Today he reports that he has continued to feel good. States that he does occasionally get some minor discomfort going down the lateral aspect of his right leg. Discussed with him that this is consistent with right sciatica. Says that it really doesn't hurt bad just something a little annoying every now and then. Comes and goes intermittently. Discussed some stretches that he could do if this does flare and follow-up if it worsens. Otherwise, has been  feeling good. He is using his insulin as directed. Did not bring in blood sugar log form for me to review today. Continues to be very careful with his feet and provide excellent foot care. Still cannot afford the co-pay to go to for eye exam. He is taking blood pressure medications as directed. Having no lightheadedness or other adverse effects. He is taking his pravastatin daily. No myalgias or right upper quadrant pain.  07/25/2017: No specific concerns today.  Says that he has been feeling fine.  That he had been writing down his blood sugars on the form but then left the form at home and forgot to bring it with him today. He is using his insulin as directed. Continues to be very careful with his feet and provide excellent foot care. Still cannot afford the co-pay to go to for eye exam. He is taking blood pressure medications as directed. Having no lightheadedness or other adverse effects. He is taking his pravastatin daily. No myalgias or right upper quadrant pain.     Past Medical History:  Diagnosis Date  . Aortic stenosis, mild   . BPH (benign prostatic hypertrophy)   . CAD (coronary artery disease)   . 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 Constipation   . Diabetes mellitus   . GERD (gastroesophageal reflux disease)   . History of cataract   . Hyperlipidemia   . Hypertension   . Nephrolithiasis   . Vitamin D deficiency      Home Meds: Outpatient Medications Prior to Visit  Medication Sig Dispense Refill  . aspirin 81 MG EC tablet Take 1 tablet (81 mg total) by mouth daily. Swallow whole. 30 tablet 12  . Blood Glucose Monitoring Suppl (ONE TOUCH ULTRA SYSTEM KIT) w/Device KIT Check blood sugar twice daily. 1 each 0  . Cholecalciferol (VITAMIN D) 2000 UNITS tablet Take 2,000 Units by mouth daily.    . fish oil-omega-3 fatty acids 1000 MG capsule Take 1 g by mouth daily.     Marland Kitchen glucose blood test strip Check blood sugar (2) twice daily. 100 each 12  . Insulin  Glargine (BASAGLAR KWIKPEN) 100 UNIT/ML SOPN Inject 0.22 mLs (22 Units total) daily into the skin. Give number of units as directed at office visit, up to 30 units daily. 5 pen 3  . Insulin Pen Needle (BD PEN NEEDLE NANO U/F) 32G X 4 MM MISC Use to check blood sugar daily 90 each 2  . Lancets (ONETOUCH ULTRASOFT) lancets Use as instructed 100 each 12  . lisinopril (PRINIVIL,ZESTRIL) 2.5 MG tablet TAKE 1 TABLET BY MOUTH  DAILY 90 tablet 1  . metoprolol succinate (TOPROL-XL) 50 MG 24 hr tablet TAKE 1 TABLET BY MOUTH  DAILY WITH  OR IMMEDIATELY  FOLLOWING A MEAL 90 tablet 3  . omeprazole (PRILOSEC) 20 MG capsule TAKE 1 CAPSULE BY MOUTH  DAILY 90 capsule 3  . pravastatin (PRAVACHOL) 40 MG tablet TAKE 1 TABLET BY MOUTH  DAILY 90 tablet 3   No facility-administered medications prior to visit.      Allergies:  Allergies  Allergen Reactions  . Actos [Pioglitazone] Other (See Comments)    Cramps in legs   . Metformin And Related Diarrhea  . Morphine     REACTION: hives  . Statins     REACTION: leg cramps but tolerates pravastatin    Social History   Socioeconomic History  . Marital status: Married    Spouse name: Not on file  . Number of children: Not on file  . Years of education: Not on file  . Highest education level: Not on file  Social Needs  . Financial resource strain: Not on file  . Food insecurity - worry: Not on file  . Food insecurity - inability: Not on file  . Transportation needs - medical: Not on file  . Transportation needs - non-medical: Not on file  Occupational History  . Not on file  Tobacco Use  . Smoking status: Former Smoker    Packs/day: 1.00    Years: 30.00    Pack years: 30.00    Last attempt to quit: 06/10/1999    Years since quitting: 18.1  . Smokeless tobacco: Former Systems developer    Types: Chew    Quit date: 04/09/2014  . Tobacco comment: Quit >15 years ago  Substance and Sexual Activity  . Alcohol use: No    Alcohol/week: 0.0 oz  . Drug use: No  .  Sexual activity: Not Currently  Other Topics Concern  . Not on file  Social History Narrative   Married for >44 years    Family History  Problem Relation Age of Onset  . Heart disease Mother   . Congestive Heart Failure Father   . Heart failure Sister 108     Review of Systems:  See HPI for pertinent ROS. All other ROS negative.    Physical Exam: Blood pressure 130/70, pulse 72, temperature 97.6 F (36.4 C), temperature source Oral, resp. rate 14, weight 68.2 kg (150 lb 6.4 oz), SpO2 98 %., Body mass index is 22.87 kg/m. General: Thin WM. Appears in no acute distress. Neck: Bilateral Carotid Bruits.  Lungs: Crackles at bases bilaterally. O/w clear.  Heart: RRR with S1 S2. I/VI murmur at 2nd ICS on Right Musculoskeletal:  Strength and tone normal for age. Extremities/Skin: Warm and dry.  No edema.  Forearms with multiple scattered areas of deep reddish-purplish purpura.  Neuro: Alert and oriented X 3. Moves all extremities spontaneously. Gait is normal. CNII-XII grossly in tact. Psych:  Responds to questions appropriately with a normal affect. Diabetic Foot Exam: Inspection: Normal. No wounds, no callouses, worisome areas. Sensation intact. 1+-2+ DP pulses bilaterally. Trace PT pulses bilaterally.      ASSESSMENT AND PLAN:  77 y.o. year old male with   1. Type 2 diabetes mellitus without complication, with long-term current use of insulin (HCC)  In the past---11/2016-- Jardiance -- for him was going to cost over $100 co-pay and that was too expensive. I then reviewed the fact that he cannot tolerate metformin, Actos, Glucotrol. Can not afford Januvia secondary to cost either. Therefore plan to just have him take insulin with no oral medicine for diabetes. Patient was informed of this. He also  was given a number to [facility] where he can call to get assistance for pain for his medicines.  He provides excellent foot care. Reminded him of proper foot care and wearing shoes  at all times.   He is overdue for ophthalmology exam. He states that he cannot afford this. He has a high co-pay.   He is on ASA 75m, on ACE inhibitor, and on statin therapy.     Subclinical hypothyroidism 07/25/2017: Check labs to monitor - TSH - T4, free  Osteopenia determined by x-ray Prior X-ray showed osteopenia. Patient has never had bone density scan. Discussed having bone density scan and he is agreeable. - DG Bone Density; Future Addendum Added 11/14/15--- DEXA shows Osteoporosis.  Pt to start Fosamax 775monce weekly. 07/25/2017: Reports that he stopped the Fosamax.  Says that it caused his stomach to hurt and caused him to have vomiting. ----------------- Given his financial distress and his age, hold off on any different treatments for this at this point.  Osteoporosis 01/24/2017: --S/P DEXA 11/2015 01/24/2017: --Started Fosamax 11/14/2015  04/25/2017--continue the Fosamax. 07/25/2017: Reports that he stopped the Fosamax.  Says that it caused his stomach to hurt and caused him to have vomiting. ----------------- Given his financial distress and his age, hold off on any different treatments for this at this point.   S/P AVR (aortic valve replacement) 07/25/2017: -Stable. -Managed by Cardiology  Cardiomyopathy, dilated, nonischemic 07/25/2017: -Stable. Managed by Cardiology  Carotid artery stenosis, bilateral In the past, cardiology was ordering his carotid artery ultrasounds. However, patient had stopped following up with cardiology as he could not afford to pay the co-pay. Because of this financial situation, I  Ordered Echo and carotid artery Dopplers more recently.  And I ordered another set of carotid Dopplers which was performed on 10/01/14. Stable. 60-79% bilateral ICA stenosis. Follow-up one year. --F/U performed 09/2015:  Stable. 60-79% bilateral ICA stenosis. Follow-up one year.  01/24/2017:  He had follow-up carotid artery ultrasound 11/12/16. I did send a  message to Dr. BrHarl Bowiend he did follow-up with me and verified that recommendation a follow-up in one year. Repeat carotid ultrasound one year. Bilateral carotid bruits - see  above 07/25/2017: Had follow-up carotid Doppler on 06/24/17.  Carotid evidence of right ICA of 1-39% stenosis.  Left carotid there was left ICA 1-39% stenosis. F/U 12 months.    Hyperlipidemia 07/25/2017: On pravastatin. Check FLP LFT now. He is fasting.  Essential hypertension 07/25/2017:Blood pressure is at goal. Continue current medications.Check lab to monitor On ACE inhibitor and beta blocker.   Weight Loss Has had evaluation by myself as well as Dr. ReWonda AmisAlso, at [facility]2016---Realized that he has no lower teeth and that those dentures have been broken for long time and he cannot afford new ones--he says he has to eat soft foods.  04/25/2017---Weight is stable.  CONSTIPATION  04/25/2017---In past was treating with Linzess, Mirilax Not a current problem.   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 Also he is on aspirin 81 mg daily and this is also contributing to this.   Screening colonoscopy: 06/03/2009. Repeat 10-15 years. Had Colonoscopy --Dr. Rehman--11/26/13--Polyps. Internal and External Hemorrhoids.   Immunizations:  Pneumonia vaccine: Patient reports that he had this at age [age]is prior PCP  .--He would have received  Pneumovax 23.   Prevnar  13 given here 09/02/14.  Tetanus vaccine: Patient recently checked with his insurance and is going to  cost him $40 and he deferred.  Zostavax: Patient check with his insurance. Too expensive. The patient defers.  Influenza vaccine: Received this 04/20/14, Has had 04/2017   F/U 3 months, sooner if needed.   Signed,     Signed, [address] [place], [place], [facility] 07/25/2017 2:58 PM

## 2017-07-26 LAB — LIPID PANEL
CHOL/HDL RATIO: 5.8 (calc) — AB (ref <5.0)
Cholesterol: 173 mg/dL (ref <200)
HDL: 30 mg/dL — ABNORMAL LOW (ref >40)
LDL Cholesterol (Calc): 107 mg/dL (calc) — ABNORMAL HIGH
NON-HDL CHOLESTEROL (CALC): 143 mg/dL — AB (ref <130)
Triglycerides: 240 mg/dL — ABNORMAL HIGH (ref <150)

## 2017-07-26 LAB — COMPLETE METABOLIC PANEL WITH GFR
AG RATIO: 1.5 (calc) (ref 1.0–2.5)
ALBUMIN MSPROF: 4.1 g/dL (ref 3.6–5.1)
ALT: 27 U/L (ref 9–46)
AST: 28 U/L (ref 10–35)
Alkaline phosphatase (APISO): 147 U/L — ABNORMAL HIGH (ref 40–115)
BUN: 12 mg/dL (ref 7–25)
CALCIUM: 9.4 mg/dL (ref 8.6–10.3)
CO2: 29 mmol/L (ref 20–32)
Chloride: 102 mmol/L (ref 98–110)
Creat: 0.89 mg/dL (ref 0.70–1.18)
GFR, EST AFRICAN AMERICAN: 95 mL/min/{1.73_m2} (ref >=60)
GFR, EST NON AFRICAN AMERICAN: 82 mL/min/{1.73_m2} (ref >=60)
GLOBULIN: 2.8 g/dL (ref 1.9–3.7)
Glucose, Bld: 134 mg/dL — ABNORMAL HIGH (ref 65–99)
POTASSIUM: 4.1 mmol/L (ref 3.5–5.3)
SODIUM: 138 mmol/L (ref 135–146)
TOTAL PROTEIN: 6.9 g/dL (ref 6.1–8.1)
Total Bilirubin: 0.8 mg/dL (ref 0.2–1.2)

## 2017-07-26 LAB — HEMOGLOBIN A1C
EAG (MMOL/L): 10 (calc)
HEMOGLOBIN A1C: 7.9 %{Hb} — AB (ref <5.7)
Mean Plasma Glucose: 180 (calc)

## 2017-07-26 LAB — TSH: TSH: 5.18 m[IU]/L — AB (ref 0.40–4.50)

## 2017-07-26 LAB — T4, FREE: Free T4: 0.9 ng/dL (ref 0.8–1.8)

## 2017-08-16 ENCOUNTER — Other Ambulatory Visit: Payer: Self-pay

## 2017-08-19 MED ORDER — ONETOUCH ULTRASOFT LANCETS MISC: 12 refills | Status: DC

## 2017-08-19 MED ORDER — INSULIN PEN NEEDLE 32G X 4 MM MISC: 2 refills | Status: DC

## 2017-08-19 MED ORDER — GLUCOSE BLOOD VI STRP: ORAL_STRIP | 12 refills | Status: DC

## 2017-08-19 MED ORDER — OMEPRAZOLE 20 MG PO CPDR: 20.0000 mg | DELAYED_RELEASE_CAPSULE | Freq: Every day | ORAL | 1 refills | Status: DC

## 2017-08-19 MED ORDER — LISINOPRIL 2.5 MG PO TABS: 2.5000 mg | ORAL_TABLET | Freq: Every day | ORAL | 1 refills | Status: DC

## 2017-08-19 MED ORDER — ASPIRIN 81 MG PO TBEC: 81.0000 mg | DELAYED_RELEASE_TABLET | Freq: Every day | ORAL | 1 refills | Status: DC

## 2017-08-19 MED ORDER — VITAMIN D 50 MCG (2000 UT) PO TABS: 2000.0000 [IU] | ORAL_TABLET | Freq: Every day | ORAL | 1 refills | Status: AC

## 2017-08-19 MED ORDER — PRAVASTATIN SODIUM 40 MG PO TABS: 40.0000 mg | ORAL_TABLET | Freq: Every day | ORAL | 0 refills | Status: DC

## 2017-08-19 MED ORDER — BASAGLAR KWIKPEN 100 UNIT/ML ~~LOC~~ SOPN: 22.0000 [IU] | PEN_INJECTOR | Freq: Every day | SUBCUTANEOUS | 3 refills | Status: DC

## 2017-08-19 MED ORDER — METOPROLOL SUCCINATE ER 50 MG PO TB24: ORAL_TABLET | ORAL | 1 refills | Status: DC

## 2017-08-19 NOTE — Telephone Encounter (Signed)
Patient switched pharmacies and requested medications be switched to [facility]

## 2017-08-20 DIAGNOSIS — R69 Illness, unspecified: Secondary | ICD-10-CM | POA: Diagnosis not present

## 2017-09-10 ENCOUNTER — Other Ambulatory Visit: Payer: Self-pay

## 2017-09-10 DIAGNOSIS — R69 Illness, unspecified: Secondary | ICD-10-CM | POA: Diagnosis not present

## 2017-09-10 MED ORDER — GLUCOSE BLOOD VI STRP: ORAL_STRIP | 12 refills | Status: DC

## 2017-09-10 MED ORDER — ONETOUCH ULTRASOFT LANCETS MISC: 12 refills | Status: DC

## 2017-10-24 ENCOUNTER — Other Ambulatory Visit: Payer: Self-pay

## 2017-10-24 ENCOUNTER — Ambulatory Visit (INDEPENDENT_AMBULATORY_CARE_PROVIDER_SITE_OTHER): Payer: Medicare HMO | Admitting: Physician Assistant

## 2017-10-24 ENCOUNTER — Encounter: Payer: Self-pay | Admitting: Physician Assistant

## 2017-10-24 VITALS — BP 138/78 | HR 76 | Temp 97.7°F | Resp 14 | Ht 68.0 in | Wt 151.8 lb

## 2017-10-24 DIAGNOSIS — E038 Other specified hypothyroidism: Secondary | ICD-10-CM

## 2017-10-24 DIAGNOSIS — I5022 Chronic systolic (congestive) heart failure: Secondary | ICD-10-CM

## 2017-10-24 DIAGNOSIS — M778 Other enthesopathies, not elsewhere classified: Secondary | ICD-10-CM

## 2017-10-24 DIAGNOSIS — E039 Hypothyroidism, unspecified: Secondary | ICD-10-CM

## 2017-10-24 DIAGNOSIS — Z794 Long term (current) use of insulin: Secondary | ICD-10-CM

## 2017-10-24 DIAGNOSIS — I350 Nonrheumatic aortic (valve) stenosis: Secondary | ICD-10-CM

## 2017-10-24 DIAGNOSIS — I6529 Occlusion and stenosis of unspecified carotid artery: Secondary | ICD-10-CM

## 2017-10-24 DIAGNOSIS — I10 Essential (primary) hypertension: Secondary | ICD-10-CM

## 2017-10-24 DIAGNOSIS — E119 Type 2 diabetes mellitus without complications: Secondary | ICD-10-CM | POA: Diagnosis not present

## 2017-10-24 DIAGNOSIS — Z952 Presence of prosthetic heart valve: Secondary | ICD-10-CM | POA: Diagnosis not present

## 2017-10-24 DIAGNOSIS — E785 Hyperlipidemia, unspecified: Secondary | ICD-10-CM | POA: Diagnosis not present

## 2017-10-24 MED ORDER — MELOXICAM 7.5 MG PO TABS
7.5000 mg | ORAL_TABLET | Freq: Every day | ORAL | 0 refills | Status: DC
Start: 2017-10-24 — End: 2017-11-20

## 2017-10-24 NOTE — Progress Notes (Signed)
Patient ID: [name] MRN: [id number], DOB: April 28, 1941, 77 y.o. Date of Encounter: _0 @  Chief Complaint:  Chief Complaint  Patient presents with  . 3 month follow up    HPI: 77 y.o. year old white male  presents today for routine followup office visit.   On 04/13/2014 he had aortic valve replacement secondary to severe aortic stenosis.  Had Normal coronaries by cardiac catheterization. Preoperatively he had low EF.  At his office visit with me 05/26/14, pt stated that they did give him metformin in the hospital, but said that he had not been taking it since he got home because his blood sugars were low.  He stated that even off of Metformin, his fasting morning blood sugars had been around 138 and 99. At that visit I told him to stay off of metformin. I reviewed the fact that he had experienced further weight loss with the hospitalization and surgery--I'm sure this is contributing to his decreased sugar.  AT OV 12/2014: He states that he checks his blood sugar about once a week. Sometimes fasting or sometimes during the day.  Says this morning fasting reading was 130. Says that he usually gets around 100 to 130 for fasting readings.  States that he takes his dog for a walk every morning. They usually go around the block and then back around. Says that he feels pretty good with this exertion.  He is taking medications as directed with no adverse effects. No lightheadedness. No presyncope.  His last office visit with me prior to the hospitlaization was 02/22/2014. The following is copied from that office visit note.:  At his routine OV 09/28/2013--at that visit I noticed that he looked even thinner that day than usual. I then noticed that I had noted at his prior visit with me 06/29/13 that he had also lost weight at the time of that visit. At that visit , I initially assumed that his weight loss was intentional  secondary to dietary changes to help control his diabetes.    However, at that visit 06/29/13 he reported that he decrease his dietary intake secondary to finances. At that visit he stated   "I just have to eat what I have.   I can't afford anything more."  Weight February 2014   was 167. Weight 06/29/13             was 155. Weight 09/28/13               was 147.  09/28/13---checked labs to evaluate underlying medical causes of weight loss including CBC TSH and CME T. These were all normal.  We also discussed the fact he had a screening colonoscopy 06/03/2009 and he was told to repeat 10-15 years.  He did come in for weight check and his weight was stable at that time. Say when he came in for that we checked our staff gave him some contact information --he could call for some assistance.  At [place] 09/28/13 he says that he is using [employer] Says that they get him through to every other Thursday. He gives very pick this up. Says that he has other phone numbers that he could call for food if he needs to but hasn't needed to so far.  He had colonoscopy by Dr. Jearld Adjutant on 12/12/13. There were polyps which were biopsied. External and internal hemorrhoids. At that time Dr. Wonda Amis planned to order CT of abdomen and pelvis as well as chest.  He  had a CT scan on 12/09/13. No malignancy was seen on either CT scan. No significant finding seen on the CT scans.  Today he says that he continues to  get food once a month from 4 or 5 different places--- [employer] out reach, [employer], [employer], their church.  Weight 12/29/14 remains stable at 146.  At visit 12/29/14 I also reviewed last cardiology note.  Saw NP at Cardiology 11/22/14. Things were stable at that visit and he was to continue current medications. No labs or tests were indicated/ ordered at that time and they plan for follow-up 6 months.  04/04/2015:  He reports that sometimes when he first stands up, his left "hip" "is stiff--takes a minute to straighten up". Says there is no pain, just  stiffness.  Does not want to pursue imaging, further eval right now sec to finances--will monitor. No other complaints or concerns. Has been feeling pretty good. Still checks blood sugar once a week--fasting---gets 120s, 133, "something like that"  10/05/2015: He has no complaints during the visit. Reports he had his follow-up carotid artery ultrasound on Monday but has not heard results yet. He is taking cholesterol medication as directed. No myalgias or other adverse effects. He is taking blood pressure medications as directed with no adverse effects. When he gets up off of the exam table to go to the lab, he moves very slowly and stiff holding to his left hip. Asked what area was bothering him he points to the anterior aspect of the left hip.  10/24/2015: At [place] 10/05/2015--A1C came back high at 8.9. 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  When I got that lab result, told him to start Metformin 1,040m BID and check fasting BS QAM and come for f/u OV 2 weeks.  Today he states that he is taking the metformin 1000 mg twice a day as directed. Says it is causing him quite a bit of gas and some heartburn. Says that it's only causing a little bit of diarrhea. Says he takes a Tums and that helps the heartburn. Then adds that these symptoms were worse when he first started the Metformin and that the symptoms are getting better.  He does bring in blood sugar log sheet. Fasting morning readings have been 167, 129, 137, 125, 115, 138, 108, 136, 111, 109, 143, 145, 114, 130.  12/22/2015--- Phone message on this date that patient called reporting having diarrhea with the metformin 1000 twice a day. At time he was told to decrease down to 500 mg twice a day.  04/16/2016: Pt reports that he has decreased the metformin down to 500 mg once a day. Says he still has some diarrhea even with this low dose. He checks his blood sugar about twice a week. Says yesterday he got 158 and that was one or 2 hours after he  ate. No other complaints or concerns today. That he is feeling good. Taking blood pressure medication as directed with no lightheadedness or other adverse effects. Taking pravastatin with no myalgias or other adverse effects.   09/27/2016: Today he came in for office visit because "high blood sugar readings". This was not a routine f/u OV. (I dont know why he is overdue for ROV / why he hasnot come in for ROV prior to today) States that he has been getting readings like 350 and 320 and says that these are fasting morning readings. Says he has been getting these kinds of readings for about 2 months now.  I reviewed his Lab from 04/2016. At that time A1c was performed. At that time I had said to add Glucotrol.  Today I asked if he was taking that as directed. He says that caused diarrhea so he is not taking it. I reviewed that  the metformin caused diarrhea in the past but asked if he was confusing this with metformin and he says no- the Glucotrol also cause diarrhea so he quit it. Now he still has some diarrhea but that it was worse with the Glucotrol. ASSESSMENT/PLAN AT THAT OV: 1. Type 2 diabetes mellitus without complication, without long-term current use of insulin (HCC) At this time Blood Sugar reading 236 and A1c 11.0 He cannot take Metformin, Actos, Glucotrol. Is not going to be able to afford multiple medications. At this time will add basal insulin. Staff has shown him how to do this and he has administered first dose here today. He is given 10 units. To then titrate up 1 unit each day until he gets to 16 units. He will then stop/ pause at 16 units. Given him a blood sugar log sheet and he is to document fasting morning readings. Is scheduling follow-up visit with me for this upcoming Monday. Bring the log sheet with him to that appointment. - Hemoglobin A1C, fingerstick - Glucose, fingerstick (stat) - Insulin Glargine (LANTUS SOLOSTAR) 100 UNIT/ML Solostar Pen; Give # units as directed at  office visit, up to 30 units daily.  Dispense: 5 pen; Refill: PRN   10/01/2016: Today he does bring in his blood sugar log sheet that we gave him at last office visit. He does have documented that he has been increasing number of units of insulin by 1 each day since that visit. Also has documented that his fasting blood sugars have been (in this chronological order according to the date): 285, 200, 267, 198. Today also reviewed the fact that he is unable to take Actos because of his cardiac issues. Can not take metformin or glipizide because each of these causes diarrhea. Verified with him again today that his diarrhea does get better when he stops the glipizide. Says that his Insulin is going to cost > $200  Assessment/Plan at that OV: 1. Type 2 diabetes mellitus without complication, without long-term current use of insulin ([place]) Today I had Maudie Mercury talk to him regarding finances and assistance that may be available to help with this as well as finding out which insulin will have the lowest co-pay with his insurance coverage. Today I have told him to continue increasing the insulin by 1 unit per day until he gets fasting sugars reading around 150. When sugar reading around 150 then he will pause and continue that number of units. At this time will add Januvia 100 mg once daily. Renal function was normal on last lab. He will continue to document fasting sugars every morning. He will have follow-up visit with me in one week.   10/08/2016: He states that he is taking the Januvia 100 mg daily. He got it at the local pharmacy and is waiting for the mail order to come in. He will pay $125 for 90 day supply. Currently using the sample of the Basaglar that we gave him at last visit. Says that he has sent in for the mail order and that his wife said it supposed to be free. Has no questions or concerns today. Having no adverse effects with the medicines.  Does bring in his blood sugar log form. Has been  increasing by 1 unit each day. The last 6 fasting blood sugar readings have been 240, 182, 158, 176, 276, 173. He is currently at 17 units daily. A/P at that OV: Today I gave him 4 weeks worth of samples of the Januvia 100 mg daily. Today I gave him samples of 2 more of the [employer] I have written # units daily on his BS log sheet---He is to continue increasing by 1 unit daily until he gets to 20 units and then we will continue 20 units daily. Will continue on the 20 units daily until he has follow-up visit in 1-2 weeks. Bring the blood sugar log form with him to that visit. F/U sooner if needed if any questions or concerns.   Addendum added 10/10/2016:  Patient's wife her for OV with me. Reports that Januvia is causing husband to have diarrhea. I checked for samples of the 41m dose so he could try this dose but we have no Januvia 548msamples. Therefore told her to have him just stop the Januvia until his follow-up visit which she says is scheduled in just a couple weeks.  10/24/2016: He reports that he did stop the Januvia. The diarrhea did resolve.  He did increase Insulin by one unit until he got to 20 units and has continued 20 units daily since.  He does bring in BS log-- Fasting morning readings since getting to 20 units are : 189, 178, 171, 161, 143, 142, 162, 140, 178, 140, 148. A/P at this OV: Will have him increase to 22 units daily and stay at that dose.  I checked for samples of Januvia 5030mgain today but we have none.  At this time will D/C Januvia and try Jardiance 48m57m 2 boxes of samples given (total 14 tablets)    01/24/2017: Today he reports he has been feeling very well. Is that his granddaughter is living with them for the summer and that she is looking into joining the Y and he is asking about whether be okay for him to swim if they joined the Y. I told him that that would be excellent exercise for him and highly encouraged him to do this. Told him to just be careful  not to hit his toes or feet but otherwise encouraged him to do that and for him to see if there are any classes offered especially senior classes for water aerobics.  He is currently taking his insulin at 22 units daily. He states that financially it is difficult to have the money for every prescriptions that he has been coming here and getting some samples. As well today I have given him 4 sample pens of Basaglar.  He brings in his blood sugar log. He only checks fasting morning readings but he does check this every day and knees are excellent. For the month of June since June 1 he has been getting the following readings: 114, 112, 153, 120, 108, 105, 166, 113, 125, 135, 120, 139, 198, 137, 118, 139, 149.  I did review telephone notes that are documented from 11/05/2016. This documents that Jardiance -- for him was going to cost over $100 co-pay and that was too expensive. I then reviewed the fact that he cannot tolerate metformin, Actos, Glucotrol. Can not afford Januvia secondary to cost either. Therefore plan to just have him take insulin with no oral medicine for diabetes. Patient was informed of this. He also was [facility]  to get assistance for pain for his medicines.  He is taking blood pressure medications as directed. Having no lightheadedness or other adverse effects.  He is taking his pravastatin daily. No myalgias or right upper quadrant pain.  He has no concerns to address today.   04/25/2017: Today he reports that he has continued to feel good. States that he does occasionally get some minor discomfort going down the lateral aspect of his right leg. Discussed with him that this is consistent with right sciatica. Says that it really doesn't hurt bad just something a little annoying every now and then. Comes and goes intermittently. Discussed some stretches that he could do if this does flare and follow-up if it worsens. Otherwise, has been  feeling good. He is using his insulin as directed. Did not bring in blood sugar log form for me to review today. Continues to be very careful with his feet and provide excellent foot care. Still cannot afford the co-pay to go to for eye exam. He is taking blood pressure medications as directed. Having no lightheadedness or other adverse effects. He is taking his pravastatin daily. No myalgias or right upper quadrant pain.  07/25/2017: No specific concerns today.  Says that he has been feeling fine.  That he had been writing down his blood sugars on the form but then left the form at home and forgot to bring it with him today. He is using his insulin as directed. Continues to be very careful with his feet and provide excellent foot care. Still cannot afford the co-pay to go to for eye exam. He is taking blood pressure medications as directed. Having no lightheadedness or other adverse effects. He is taking his pravastatin daily. No myalgias or right upper quadrant pain.   10/24/2017:  He reports that he has been feeling good and everything has seemed to be stable as far as he can tell.  Has no specific concerns to address today. He is using his insulin as directed. Continues to be very careful with his feet and provide excellent foot care. Still cannot afford the co-pay to go to for eye exam. He is currently giving insulin 22 units daily.  States that he checks his blood sugar fasting every morning.  Says that he sometimes gets around 120s or 140s but sometimes higher towards 160s or 180s.  Never gets any low readings less than 100--never low near 80. He is taking blood pressure medications as directed. Having no lightheadedness or other adverse effects. He is taking his pravastatin daily. No myalgias or right upper quadrant pain. The end of the visit as we are going to the lab he tells me that his left elbow has been hurting recently.  States that he has been doing no repetitive motion that he is  aware of.  Symptoms are consistent with tendinitis.  Reviewed that his creatinine has been normal at last lab.  Prescribed Mobic for him to take daily with food for 2 weeks. The end of the visit as we were in the lab he also asked for samples of insulin.  Gave him 2 basaglar insulin pens.     Past Medical History:  Diagnosis Date  . Aortic stenosis, mild   . BPH (benign prostatic hypertrophy)   . CAD (coronary artery disease)   . 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 Constipation   . Diabetes mellitus   . GERD (gastroesophageal reflux disease)   . History of cataract   .  Hyperlipidemia   . Hypertension   . Nephrolithiasis   . Vitamin D deficiency      Home Meds: Outpatient Medications Prior to Visit  Medication Sig Dispense Refill  . aspirin 81 MG EC tablet Take 1 tablet (81 mg total) by mouth daily. Swallow whole. 90 tablet 1  . Blood Glucose Monitoring Suppl (ONE TOUCH ULTRA SYSTEM KIT) w/Device KIT Check blood sugar twice daily. 1 each 0  . Cholecalciferol (VITAMIN D) 2000 units tablet Take 1 tablet (2,000 Units total) by mouth daily. 90 tablet 1  . fish oil-omega-3 fatty acids 1000 MG capsule Take 1 g by mouth daily.     Marland Kitchen glucose blood test strip Check blood sugar (2) twice daily.Please dispense based on insurance preference ICD 10 E11.9,Z79.4 100 each 12  . Insulin Glargine (BASAGLAR KWIKPEN) 100 UNIT/ML SOPN Inject 0.22 mLs (22 Units total) into the skin daily. Give number of units as directed at office visit, up to 30 units daily. 15 pen 3  . Insulin Pen Needle (BD PEN NEEDLE NANO U/F) 32G X 4 MM MISC Use to check blood sugar daily 90 each 2  . Lancets (ONETOUCH ULTRASOFT) lancets Use to check blood sugar. Dispensed based on insurance preference ICD10 E11.9, Z79.4 100 each 12  . lisinopril (PRINIVIL,ZESTRIL) 2.5 MG tablet Take 1 tablet (2.5 mg total) by mouth daily. 90 tablet 1  . metoprolol succinate (TOPROL-XL) 50 MG 24 hr tablet TAKE 1 TABLET BY MOUTH  DAILY  WITH OR IMMEDIATELY  FOLLOWING A MEAL 90 tablet 1  . omeprazole (PRILOSEC) 20 MG capsule Take 1 capsule (20 mg total) by mouth daily. 90 capsule 1  . pravastatin (PRAVACHOL) 40 MG tablet Take 1 tablet (40 mg total) by mouth daily. 90 tablet 0   No facility-administered medications prior to visit.      Allergies:  Allergies  Allergen Reactions  . Actos [Pioglitazone] Other (See Comments)    Cramps in legs   . Metformin And Related Diarrhea  . Morphine     REACTION: hives  . Statins     REACTION: leg cramps but tolerates pravastatin    Social History   Socioeconomic History  . Marital status: Married    Spouse name: Not on file  . Number of children: Not on file  . Years of education: Not on file  . Highest education level: Not on file  Occupational History  . Not on file  Social Needs  . Financial resource strain: Not on file  . Food insecurity:    Worry: Not on file    Inability: Not on file  . Transportation needs:    Medical: Not on file    Non-medical: Not on file  Tobacco Use  . Smoking status: Former Smoker    Packs/day: 1.00    Years: 30.00    Pack years: 30.00    Last attempt to quit: 06/10/1999    Years since quitting: 18.3  . Smokeless tobacco: Former Systems developer    Types: Chew    Quit date: 04/09/2014  . Tobacco comment: Quit >15 years ago  Substance and Sexual Activity  . Alcohol use: No    Alcohol/week: 0.0 oz  . Drug use: No  . Sexual activity: Not Currently  Lifestyle  . Physical activity:    Days per week: Not on file    Minutes per session: Not on file  . Stress: Not on file  Relationships  . Social connections:    Talks on phone: Not on  file    Gets together: Not on file    Attends religious service: Not on file    Active member of club or organization: Not on file    Attends meetings of clubs or organizations: Not on file    Relationship status: Not on file  . Intimate partner violence:    Fear of current or ex partner: Not on file     Emotionally abused: Not on file    Physically abused: Not on file    Forced sexual activity: Not on file  Other Topics Concern  . Not on file  Social History Narrative   Married for >44 years    Family History  Problem Relation Age of Onset  . Heart disease Mother   . Congestive Heart Failure Father   . Heart failure Sister 37     Review of Systems:  See HPI for pertinent ROS. All other ROS negative.    Physical Exam: Blood pressure 138/78, pulse 76, temperature 97.7 F (36.5 C), temperature source Oral, resp. rate 14, height _0  (1.727 m), weight 68.9 kg (151 lb 12.8 oz), SpO2 98 %., Body mass index is 23.08 kg/m. General: Thin WM. Appears in no acute distress. Neck: Bilateral Carotid Bruits.  Lungs: Crackles at bases bilaterally. O/w clear.  Heart: RRR with S1 S2. II/VI murmur at 2nd ICS on Right Musculoskeletal:  Strength and tone normal for age. Extremities/Skin: Warm and dry.  No edema.  Forearms with multiple scattered areas of deep reddish-purplish purpura.  Neuro: Alert and oriented X 3. Moves all extremities spontaneously. Gait is normal. CNII-XII grossly in tact. Psych:  Responds to questions appropriately with a normal affect. Diabetic Foot Exam: Inspection: Normal. No wounds, no callouses, worisome areas. Sensation intact.  2+ DP pulses bilaterally. Trace PT pulses bilaterally.      ASSESSMENT AND PLAN:  77 y.o. year old male with   1. Type 2 diabetes mellitus without complication, with long-term current use of insulin (HCC)  In the past---11/2016-- Jardiance -- for him was going to cost over $100 co-pay and that was too expensive. I then reviewed the fact that he cannot tolerate metformin, Actos, Glucotrol. Can not afford Januvia secondary to cost either. Therefore plan to just have him take insulin with no oral medicine for diabetes. Patient was informed of this. He also was given a number to [facility] where he can call to get assistance for pain for  his medicines.  He provides excellent foot care. Reminded him of proper foot care and wearing shoes at all times.   He is overdue for ophthalmology exam. He states that he cannot afford this. He has a high co-pay.   He is on ASA 76m, on ACE inhibitor, and on statin therapy.  10/24/2017: Check A1c to monitor.   Subclinical hypothyroidism 07/25/2017: Check labs to monitor - TSH - T4, free 10/24/2017: Last labs were stable.  Wait to recheck 6 months.  Osteopenia determined by x-ray Prior X-ray showed osteopenia. Patient has never had bone density scan. Discussed having bone density scan and he is agreeable. - DG Bone Density; Future Addendum Added 11/14/15--- DEXA shows Osteoporosis.  Pt to start Fosamax 750monce weekly. 07/25/2017: Reports that he stopped the Fosamax.  Says that it caused his stomach to hurt and caused him to have vomiting. ----------------- Given his financial distress and his age, hold off on any different treatments for this at this point.  Osteoporosis 01/24/2017: --S/P DEXA 11/2015 01/24/2017: --Started Fosamax 11/14/2015  04/25/2017--continue the Fosamax. 07/25/2017: Reports that he stopped the Fosamax.  Says that it caused his stomach to hurt and caused him to have vomiting. ----------------- Given his financial distress and his age, hold off on any different treatments for this at this point.   S/P AVR (aortic valve replacement) 10/24/2017:  -Stable. -Managed by Cardiology  Cardiomyopathy, dilated, nonischemic 10/24/2017:   -Stable. Managed by Cardiology  Carotid artery stenosis, bilateral In the past, cardiology was ordering his carotid artery ultrasounds. However, patient had stopped following up with cardiology as he could not afford to pay the co-pay. Because of this financial situation, I  Ordered Echo and carotid artery Dopplers more recently.  And I ordered another set of carotid Dopplers which was performed on 10/01/14. Stable. 60-79% bilateral ICA  stenosis. Follow-up one year. --F/U performed 09/2015:  Stable. 60-79% bilateral ICA stenosis. Follow-up one year.  01/24/2017:  He had follow-up carotid artery ultrasound 11/12/16. I did send a message to Dr. Harl Bowie and he did follow-up with me and verified that recommendation a follow-up in one year. Repeat carotid ultrasound one year. Bilateral carotid bruits - see  above 07/25/2017: Had follow-up carotid Doppler on 06/24/17.  Carotid evidence of right ICA of 1-39% stenosis.  Left carotid there was left ICA 1-39% stenosis. F/U 12 months.    Hyperlipidemia 07/25/2017: On pravastatin. Check FLP LFT now. He is fasting. 10/24/2017:  Reviewed that [place] 07/25/17 LDL was 107 but continued current dose of pravastatin because he has had a history of myalgias with multiple statins in the past.   Essential hypertension 10/24/2017:   Blood pressure is at goal. Continue current medications.Check lab to monitor On ACE inhibitor and beta blocker.   Weight Loss Has had evaluation by myself as well as Dr. Wonda Amis. 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  04/25/2017---Weight is stable.  CONSTIPATION  04/25/2017---In past was treating with Linzess, Mirilax Not a current problem.   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 Also he is on aspirin 81 mg daily and this is also contributing to this.   Screening colonoscopy: 06/03/2009. Repeat 10-15 years. Had Colonoscopy --Dr. Rehman--11/26/13--Polyps. Internal and External Hemorrhoids.   Immunizations:  Pneumonia vaccine: Patient reports that he had this at age 34 his prior PCP  .--He would have received  Pneumovax 23.   Prevnar  13 given here 09/02/14.  Tetanus vaccine: Patient recently checked with his insurance and is going to cost him $40 and he  deferred.  Zostavax: Patient check with his insurance. Too expensive. The patient defers.  Influenza vaccine: Received this 04/20/14, Has had 04/2017   F/U 3 months, sooner if needed.   Signed,     Signed, [address] [place], [place], [facility] 10/24/2017 9:16 AM

## 2017-10-25 LAB — HEMOGLOBIN A1C
HEMOGLOBIN A1C: 8.5 %{Hb} — AB (ref <5.7)
MEAN PLASMA GLUCOSE: 197 (calc)
eAG (mmol/L): 10.9 (calc)

## 2017-11-06 ENCOUNTER — Ambulatory Visit (INDEPENDENT_AMBULATORY_CARE_PROVIDER_SITE_OTHER): Payer: Medicare HMO | Admitting: Physician Assistant

## 2017-11-06 ENCOUNTER — Encounter: Payer: Self-pay | Admitting: Physician Assistant

## 2017-11-06 VITALS — BP 142/80 | HR 84 | Temp 97.6°F | Resp 14 | Ht 68.0 in | Wt 152.6 lb

## 2017-11-06 DIAGNOSIS — Z794 Long term (current) use of insulin: Secondary | ICD-10-CM

## 2017-11-06 DIAGNOSIS — E119 Type 2 diabetes mellitus without complications: Secondary | ICD-10-CM | POA: Diagnosis not present

## 2017-11-06 NOTE — Progress Notes (Addendum)
Patient ID: KAISER BELLUOMINI MRN: [id number], DOB: May 18, 1941, 77 y.o. Date of Encounter: _0 @  Chief Complaint:  No chief complaint on file.   HPI: 77 y.o. year old white male  presents today for routine followup office visit.   On 04/13/2014 he had aortic valve replacement secondary to severe aortic stenosis.  Had Normal coronaries by cardiac catheterization. Preoperatively he had low EF.  At his office visit with me 79/21/15, pt stated that they did give him metformin in the hospital, but said that he had not been taking it since he got home because his blood sugars were low.  He stated that even off of Metformin, his fasting morning blood sugars had been around 138 and 99. At that visit I told him to stay off of metformin. I reviewed the fact that he had experienced further weight loss with the hospitalization and surgery--I'm sure this is contributing to his decreased sugar.  AT OV 12/2014: He states that he checks his blood sugar about once a week. Sometimes fasting or sometimes during the day.  Says this morning fasting reading was 130. Says that he usually gets around 100 to 130 for fasting readings.  States that he takes his dog for a walk every morning. They usually go around the block and then back around. Says that he feels pretty good with this exertion.  He is taking medications as directed with no adverse effects. No lightheadedness. No presyncope.  His last office visit with me prior to the hospitlaization was 02/22/2014. The following is copied from that office visit note.:  At his routine OV 79/23/2015--at that visit I noticed that he looked even thinner that day than usual. I then noticed that I had noted at his prior visit with me 06/29/13 that he had also lost weight at the time of that visit. At that visit , I initially assumed that his weight loss was intentional  secondary to dietary changes to help control his diabetes.  However, at that visit 06/29/13 he  reported that he decrease his dietary intake secondary to finances. At that visit he stated   "I just have to eat what I have.   I can't afford anything more."  Weight February 2014   was 167. Weight 06/29/13             was 155. Weight 09/28/13               was 147.  09/28/13---checked labs to evaluate underlying medical causes of weight loss including CBC TSH and CME T. These were all normal.  We also discussed the fact he had a screening colonoscopy 06/03/2009 and he was told to repeat 79-15 years.  He did come in for weight check and his weight was stable at that time. Say when he came in for that we checked our staff gave him some contact information --he could call for some assistance.  At [place] 09/28/13 he says that he is using [employer] Says that they get him through to every other Thursday. He gives very pick this up. Says that he has other phone numbers that he could call for food if he needs to but hasn't needed to so far.  He had colonoscopy by Dr. Jearld Adjutant on 12/12/13. There were polyps which were biopsied. External and internal hemorrhoids. At that time Dr. Wonda Amis planned to order CT of abdomen and pelvis as well as chest.  He had a CT scan on 12/09/13. No malignancy was  seen on either CT scan. No significant finding seen on the CT scans.  Today he says that he continues to  get food once a month from 4 or 5 different places--- [employer] out reach, [employer], [employer], their church.  Weight 12/29/14 remains stable at 146.  At visit 12/29/14 I also reviewed last cardiology note.  Saw NP at Cardiology 11/22/14. Things were stable at that visit and he was to continue current medications. No labs or tests were indicated/ ordered at that time and they plan for follow-up 6 months.  04/04/2015:  He reports that sometimes when he first stands up, his left "hip" "is stiff--takes a minute to straighten up". Says there is no pain, just stiffness.  Does not want to pursue  imaging, further eval right now sec to finances--will monitor. No other complaints or concerns. Has been feeling pretty good. Still checks blood sugar once a week--fasting---gets 120s, 133, "something like that"  10/05/2015: He has no complaints during the visit. Reports he had his follow-up carotid artery ultrasound on Monday but has not heard results yet. He is taking cholesterol medication as directed. No myalgias or other adverse effects. He is taking blood pressure medications as directed with no adverse effects. When he gets up off of the exam table to go to the lab, he moves very slowly and stiff holding to his left hip. Asked what area was bothering him he points to the anterior aspect of the left hip.  10/24/2015: At [place] 10/05/2015--A1C came back high at 8.9. 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  When I got that lab result, told him to start Metformin 1,071m BID and check fasting BS QAM and come for f/u OV 2 weeks.  Today he states that he is taking the metformin 1000 mg twice a day as directed. Says it is causing him quite a bit of gas and some heartburn. Says that it's only causing a little bit of diarrhea. Says he takes a Tums and that helps the heartburn. Then adds that these symptoms were worse when he first started the Metformin and that the symptoms are getting better.  He does bring in blood sugar log sheet. Fasting morning readings have been 167, 129, 137, 125, 115, 138, 108, 136, 111, 109, 143, 145, 114, 130.  12/22/2015--- Phone message on this date that patient called reporting having diarrhea with the metformin 1000 twice a day. At time he was told to decrease down to 500 mg twice a day.  04/16/2016: Pt reports that he has decreased the metformin down to 500 mg once a day. Says he still has some diarrhea even with this low dose. He checks his blood sugar about twice a week. Says yesterday he got 158 and that was one or 2 hours after he ate. No other complaints or concerns  today. That he is feeling good. Taking blood pressure medication as directed with no lightheadedness or other adverse effects. Taking pravastatin with no myalgias or other adverse effects.   09/27/2016: Today he came in for office visit because "high blood sugar readings". This was not a routine f/u OV. (I dont know why he is overdue for ROV / why he hasnot come in for ROV prior to today) States that he has been getting readings like 350 and 320 and says that these are fasting morning readings. Says he has been getting these kinds of readings for about 2 months now.  I reviewed his Lab from 04/2016. At that time  A1c was performed. At that time I had said to add Glucotrol.  Today I asked if he was taking that as directed. He says that caused diarrhea so he is not taking it. I reviewed that  the metformin caused diarrhea in the past but asked if he was confusing this with metformin and he says no- the Glucotrol also cause diarrhea so he quit it. Now he still has some diarrhea but that it was worse with the Glucotrol. ASSESSMENT/PLAN AT THAT OV: 1. Type 2 diabetes mellitus without complication, without long-term current use of insulin (HCC) At this time Blood Sugar reading 236 and A1c 11.0 He cannot take Metformin, Actos, Glucotrol. Is not going to be able to afford multiple medications. At this time will add basal insulin. Staff has shown him how to do this and he has administered first dose here today. He is given 10 units. To then titrate up 1 unit each day until he gets to 16 units. He will then stop/ pause at 16 units. Given him a blood sugar log sheet and he is to document fasting morning readings. Is scheduling follow-up visit with me for this upcoming Monday. Bring the log sheet with him to that appointment. - Hemoglobin A1C, fingerstick - Glucose, fingerstick (stat) - Insulin Glargine (LANTUS SOLOSTAR) 100 UNIT/ML Solostar Pen; Give # units as directed at office visit, up to 30 units daily.   Dispense: 5 pen; Refill: PRN   10/01/2016: Today he does bring in his blood sugar log sheet that we gave him at last office visit. He does have documented that he has been increasing number of units of insulin by 1 each day since that visit. Also has documented that his fasting blood sugars have been (in this chronological order according to the date): 285, 200, 267, 198. Today also reviewed the fact that he is unable to take Actos because of his cardiac issues. Can not take metformin or glipizide because each of these causes diarrhea. Verified with him again today that his diarrhea does get better when he stops the glipizide. Says that his Insulin is going to cost > $200  Assessment/Plan at that OV: 1. Type 2 diabetes mellitus without complication, without long-term current use of insulin ([place]) Today I had Maudie Mercury talk to him regarding finances and assistance that may be available to help with this as well as finding out which insulin will have the lowest co-pay with his insurance coverage. Today I have told him to continue increasing the insulin by 1 unit per day until he gets fasting sugars reading around 150. When sugar reading around 150 then he will pause and continue that number of units. At this time will add Januvia 100 mg once daily. Renal function was normal on last lab. He will continue to document fasting sugars every morning. He will have follow-up visit with me in one week.   10/08/2016: He states that he is taking the Januvia 100 mg daily. He got it at the local pharmacy and is waiting for the mail order to come in. He will pay $125 for 90 day supply. Currently using the sample of the Basaglar that we gave him at last visit. Says that he has sent in for the mail order and that his wife said it supposed to be free. Has no questions or concerns today. Having no adverse effects with the medicines.  Does bring in his blood sugar log form. Has been increasing by 1 unit each day. The last 6  fasting blood sugar readings have been 240, 182, 158, 176, 276, 173. He is currently at 17 units daily. A/P at that OV: Today I gave him 4 weeks worth of samples of the Januvia 100 mg daily. Today I gave him samples of 2 more of the [employer] I have written # units daily on his BS log sheet---He is to continue increasing by 1 unit daily until he gets to 20 units and then we will continue 20 units daily. Will continue on the 20 units daily until he has follow-up visit in 1-2 weeks. Bring the blood sugar log form with him to that visit. F/U sooner if needed if any questions or concerns.   Addendum added 10/10/2016:  Patient's wife her for OV with me. Reports that Januvia is causing husband to have diarrhea. I checked for samples of the 52m dose so he could try this dose but we have no Januvia 556msamples. Therefore told her to have him just stop the Januvia until his follow-up visit which she says is scheduled in just a couple weeks.  10/24/2016: He reports that he did stop the Januvia. The diarrhea did resolve.  He did increase Insulin by one unit until he got to 20 units and has continued 20 units daily since.  He does bring in BS log-- Fasting morning readings since getting to 20 units are : 189, 178, 171, 161, 143, 142, 162, 140, 178, 140, 148. A/P at this OV: Will have him increase to 22 units daily and stay at that dose.  I checked for samples of Januvia 5061mgain today but we have none.  At this time will D/C Januvia and try Jardiance 32m11m 2 boxes of samples given (total 14 tablets)    01/24/2017: Today he reports he has been feeling very well. Is that his granddaughter is living with them for the summer and that she is looking into joining the Y and he is asking about whether be okay for him to swim if they joined the Y. I told him that that would be excellent exercise for him and highly encouraged him to do this. Told him to just be careful not to hit his toes or feet but otherwise  encouraged him to do that and for him to see if there are any classes offered especially senior classes for water aerobics.  He is currently taking his insulin at 22 units daily. He states that financially it is difficult to have the money for every prescriptions that he has been coming here and getting some samples. As well today I have given him 4 sample pens of Basaglar.  He brings in his blood sugar log. He only checks fasting morning readings but he does check this every day and knees are excellent. For the month of June since June 1 he has been getting the following readings: 114, 112, 153, 120, 108, 105, 166, 113, 125, 135, 120, 139, 198, 137, 118, 139, 149.  I did review telephone notes that are documented from 11/05/2016. This documents that Jardiance -- for him was going to cost over $100 co-pay and that was too expensive. I then reviewed the fact that he cannot tolerate metformin, Actos, Glucotrol. Can not afford Januvia secondary to cost either. Therefore plan to just have him take insulin with no oral medicine for diabetes. Patient was informed of this. He also was [facility] he can call to get assistance for pain for his medicines.  He is taking blood pressure medications as directed. Having no lightheadedness or other adverse effects.  He is taking his pravastatin daily. No myalgias or right upper quadrant pain.  He has no concerns to address today.   04/25/2017: Today he reports that he has continued to feel good. States that he does occasionally get some minor discomfort going down the lateral aspect of his right leg. Discussed with him that this is consistent with right sciatica. Says that it really doesn't hurt bad just something a little annoying every now and then. Comes and goes intermittently. Discussed some stretches that he could do if this does flare and follow-up if it worsens. Otherwise, has been feeling good. He is using his insulin as  directed. Did not bring in blood sugar log form for me to review today. Continues to be very careful with his feet and provide excellent foot care. Still cannot afford the co-pay to go to for eye exam. He is taking blood pressure medications as directed. Having no lightheadedness or other adverse effects. He is taking his pravastatin daily. No myalgias or right upper quadrant pain.  07/25/2017: No specific concerns today.  Says that he has been feeling fine.  That he had been writing down his blood sugars on the form but then left the form at home and forgot to bring it with him today. He is using his insulin as directed. Continues to be very careful with his feet and provide excellent foot care. Still cannot afford the co-pay to go to for eye exam. He is taking blood pressure medications as directed. Having no lightheadedness or other adverse effects. He is taking his pravastatin daily. No myalgias or right upper quadrant pain.   10/24/2017:  He reports that he has been feeling good and everything has seemed to be stable as far as he can tell.  Has no specific concerns to address today. He is using his insulin as directed. Continues to be very careful with his feet and provide excellent foot care. Still cannot afford the co-pay to go to for eye exam. He is currently giving insulin 22 units daily.  States that he checks his blood sugar fasting every morning.  Says that he sometimes gets around 120s or 140s but sometimes higher towards 160s or 180s.  Never gets any low readings less than 100--never low near 80. He is taking blood pressure medications as directed. Having no lightheadedness or other adverse effects. He is taking his pravastatin daily. No myalgias or right upper quadrant pain. The end of the visit as we are going to the lab he tells me that his left elbow has been hurting recently.  States that he has been doing no repetitive motion that he is aware of.  Symptoms are consistent with  tendinitis.  Reviewed that his creatinine has been normal at last lab.  Prescribed Mobic for him to take daily with food for 2 weeks. The end of the visit as we were in the lab he also asked for samples of insulin.  Gave him 2 basaglar insulin pens.   11/06/2017: Office visit 10/24/2017 A1c came back 8.5. Prior to that time, he had been administering 22 units of basal insulin daily. At that time I recommended for him to increase to 24 units of basal insulin daily.  Check fasting morning blood sugars and document every morning and bring into follow-up office visit in 1 week. He presents for that follow-up visit today. He reports that he did increase the  insulin up to 24 units daily.   Since increasing to 24 units his fasting morning blood sugars have been: 194, 170, 159, 154, 160, 148, 245, 176, 148, 211. The readings in February when he was still on the 22 units were very similar.  Actually overall blood sugars actually were better back around January and February. Today I have given him a new blood sugar log form and have filled in 24 units-----11/04/17------ 176 24 units------ 11/05/17------ 148 24 units------- 11/06/17---------- 211 25 units--------- 11/07/17---------- 26 units--------- 11/08/17-------- 27 units----------- 11/09/17----------  He is to continue to increase by 1 unit each day until fasting blood sugars are around 110 - 150.  When he gets to blood sugar around 110 - 150 then he will pause and continue at that dose of insulin. Will have him come in for follow-up visit in 1 week.    Past Medical History:  Diagnosis Date  . Aortic stenosis, mild   . BPH (benign prostatic hypertrophy)   . CAD (coronary artery disease)   . 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 Constipation   . Diabetes mellitus   . GERD (gastroesophageal reflux disease)   . History of cataract   . Hyperlipidemia   . Hypertension   . Nephrolithiasis   . Vitamin D deficiency      Home  Meds: Outpatient Medications Prior to Visit  Medication Sig Dispense Refill  . aspirin 81 MG EC tablet Take 1 tablet (81 mg total) by mouth daily. Swallow whole. 90 tablet 1  . Blood Glucose Monitoring Suppl (ONE TOUCH ULTRA SYSTEM KIT) w/Device KIT Check blood sugar twice daily. 1 each 0  . Cholecalciferol (VITAMIN D) 2000 units tablet Take 1 tablet (2,000 Units total) by mouth daily. 90 tablet 1  . fish oil-omega-3 fatty acids 1000 MG capsule Take 1 g by mouth daily.     Marland Kitchen glucose blood test strip Check blood sugar (2) twice daily.Please dispense based on insurance preference ICD 10 E11.9,Z79.4 100 each 12  . Insulin Glargine (BASAGLAR KWIKPEN) 100 UNIT/ML SOPN Inject 0.22 mLs (22 Units total) into the skin daily. Give number of units as directed at office visit, up to 30 units daily. 15 pen 3  . Insulin Pen Needle (BD PEN NEEDLE NANO U/F) 32G X 4 MM MISC Use to check blood sugar daily 90 each 2  . Lancets (ONETOUCH ULTRASOFT) lancets Use to check blood sugar. Dispensed based on insurance preference ICD10 E11.9, Z79.4 100 each 12  . lisinopril (PRINIVIL,ZESTRIL) 2.5 MG tablet Take 1 tablet (2.5 mg total) by mouth daily. 90 tablet 1  . meloxicam (MOBIC) 7.5 MG tablet Take 1 tablet (7.5 mg total) by mouth daily. Take with food. 30 tablet 0  . metoprolol succinate (TOPROL-XL) 50 MG 24 hr tablet TAKE 1 TABLET BY MOUTH  DAILY WITH OR IMMEDIATELY  FOLLOWING A MEAL 90 tablet 1  . omeprazole (PRILOSEC) 20 MG capsule Take 1 capsule (20 mg total) by mouth daily. 90 capsule 1  . pravastatin (PRAVACHOL) 40 MG tablet Take 1 tablet (40 mg total) by mouth daily. 90 tablet 0   No facility-administered medications prior to visit.      Allergies:  Allergies  Allergen Reactions  . Actos [Pioglitazone] Other (See Comments)    Cramps in legs   . Metformin And Related Diarrhea  . Morphine     REACTION: hives  . Statins     REACTION: leg cramps but tolerates pravastatin  Social History    Socioeconomic History  . Marital status: Married    Spouse name: Not on file  . Number of children: Not on file  . Years of education: Not on file  . Highest education level: Not on file  Occupational History  . Not on file  Social Needs  . Financial resource strain: Not on file  . Food insecurity:    Worry: Not on file    Inability: Not on file  . Transportation needs:    Medical: Not on file    Non-medical: Not on file  Tobacco Use  . Smoking status: Former Smoker    Packs/day: 1.00    Years: 30.00    Pack years: 30.00    Last attempt to quit: 06/10/1999    Years since quitting: 18.4  . Smokeless tobacco: Former Systems developer    Types: Chew    Quit date: 04/09/2014  . Tobacco comment: Quit >15 years ago  Substance and Sexual Activity  . Alcohol use: No    Alcohol/week: 0.0 oz  . Drug use: No  . Sexual activity: Not Currently  Lifestyle  . Physical activity:    Days per week: Not on file    Minutes per session: Not on file  . Stress: Not on file  Relationships  . Social connections:    Talks on phone: Not on file    Gets together: Not on file    Attends religious service: Not on file    Active member of club or organization: Not on file    Attends meetings of clubs or organizations: Not on file    Relationship status: Not on file  . Intimate partner violence:    Fear of current or ex partner: Not on file    Emotionally abused: Not on file    Physically abused: Not on file    Forced sexual activity: Not on file  Other Topics Concern  . Not on file  Social History Narrative   Married for >44 years    Family History  Problem Relation Age of Onset  . Heart disease Mother   . Congestive Heart Failure Father   . Heart failure Sister 61     Review of Systems:  See HPI for pertinent ROS. All other ROS negative.    Physical Exam: There were no vitals taken for this visit., There is no height or weight on file to calculate BMI. General: Thin WM. Appears in no acute  distress. Neck: Bilateral Carotid Bruits.  Lungs: Crackles at bases bilaterally. O/w clear.  Heart: RRR with S1 S2. II/VI murmur at 2nd ICS on Right Musculoskeletal:  Strength and tone normal for age. Extremities/Skin: Warm and dry.  No edema.  Forearms with multiple scattered areas of deep reddish-purplish purpura.  Neuro: Alert and oriented X 3. Moves all extremities spontaneously. Gait is normal. CNII-XII grossly in tact. Psych:  Responds to questions appropriately with a normal affect. Diabetic Foot Exam: Inspection: Normal. No wounds, no callouses, worisome areas. Sensation intact.  2+ DP pulses bilaterally. Trace PT pulses bilaterally.      ASSESSMENT AND PLAN:  77 y.o. year old male with    1. Type 2 diabetes mellitus without complication, with long-term current use of insulin ([place])  11/06/2017: Office visit 10/24/2017 A1c came back 8.5. Prior to that time, he had been administering 22 units of basal insulin daily. At that time I recommended for him to increase to 24 units of basal insulin daily.  Check fasting  morning blood sugars and document every morning and bring into follow-up office visit in 1 week. He presents for that follow-up visit today. He reports that he did increase the insulin up to 24 units daily.   Since increasing to 24 units his fasting morning blood sugars have been: 194, 170, 159, 154, 160, 148, 245, 176, 148, 211. The readings in February when he was still on the 22 units were very similar.  Actually overall blood sugars actually were better back around January and February. Today I have given him a new blood sugar log form and have filled in 24 units-----11/04/17------ 176 24 units------ 11/05/17------ 148 24 units------- 11/06/17---------- 211 25 units--------- 11/07/17---------- 26 units--------- 11/08/17-------- 27 units----------- 11/09/17----------  He is to continue to increase by 1 unit each day until fasting blood sugars are around 110 - 150.  When he gets  to blood sugar around 110 - 150 then he will pause and continue at that dose of insulin. Will have him come in for follow-up visit in 1 week.        -----------------------THE FOLLOWING IS COPIED, PULLED FORWARD FROM OV 10/24/2017----THE FOLLOWING WAS NOT ADDRESSED AT OV 11/06/2017:-----------------------------------------------  1. Type 2 diabetes mellitus without complication, with long-term current use of insulin (HCC)  In the past---11/2016-- Jardiance -- for him was going to cost over $100 co-pay and that was too expensive. I then reviewed the fact that he cannot tolerate metformin, Actos, Glucotrol. Can not afford Januvia secondary to cost either. Therefore plan to just have him take insulin with no oral medicine for diabetes. Patient was informed of this. He also was given a number to [facility] [facility] where he can call to get assistance for pain for his medicines.  He provides excellent foot care. Reminded him of proper foot care and wearing shoes at all times.   He is overdue for ophthalmology exam. He states that he cannot afford this. He has a high co-pay.   He is on ASA 65m, on ACE inhibitor, and on statin therapy.  10/24/2017: Check A1c to monitor.   Subclinical hypothyroidism 07/25/2017: Check labs to monitor - TSH - T4, free 10/24/2017: Last labs were stable.  Wait to recheck 6 months.  Osteopenia determined by x-ray Prior X-ray showed osteopenia. Patient has never had bone density scan. Discussed having bone density scan and he is agreeable. - DG Bone Density; Future Addendum Added 11/14/15--- DEXA shows Osteoporosis.  Pt to start Fosamax 727monce weekly. 07/25/2017: Reports that he stopped the Fosamax.  Says that it caused his stomach to hurt and caused him to have vomiting. ----------------- Given his financial distress and his age, hold off on any different treatments for this at this point.  Osteoporosis 01/24/2017: --S/P DEXA 11/2015 01/24/2017: --Started  Fosamax 11/14/2015  04/25/2017--continue the Fosamax. 07/25/2017: Reports that he stopped the Fosamax.  Says that it caused his stomach to hurt and caused him to have vomiting. ----------------- Given his financial distress and his age, hold off on any different treatments for this at this point.   S/P AVR (aortic valve replacement) 10/24/2017:  -Stable. -Managed by Cardiology  Cardiomyopathy, dilated, nonischemic 10/24/2017:   -Stable. Managed by Cardiology  Carotid artery stenosis, bilateral In the past, cardiology was ordering his carotid artery ultrasounds. However, patient had stopped following up with cardiology as he could not afford to pay the co-pay. Because of this financial situation, I  Ordered Echo and carotid artery Dopplers more recently.  And I ordered another set of carotid Dopplers  which was performed on 10/01/14. Stable. 60-79% bilateral ICA stenosis. Follow-up one year. --F/U performed 09/2015:  Stable. 60-79% bilateral ICA stenosis. Follow-up one year.  01/24/2017:  He had follow-up carotid artery ultrasound 11/12/16. I did send a message to Dr. Harl Bowie and he did follow-up with me and verified that recommendation a follow-up in one year. Repeat carotid ultrasound one year. Bilateral carotid bruits - see  above 07/25/2017: Had follow-up carotid Doppler on 06/24/17.  Carotid evidence of right ICA of 1-39% stenosis.  Left carotid there was left ICA 1-39% stenosis. F/U 12 months.    Hyperlipidemia 07/25/2017: On pravastatin. Check FLP LFT now. He is fasting. 10/24/2017:  Reviewed that [place] 07/25/17 LDL was 107 but continued current dose of pravastatin because he has had a history of myalgias with multiple statins in the past.   Essential hypertension 10/24/2017:   Blood pressure is at goal. Continue current medications.Check lab to monitor On ACE inhibitor and beta blocker.   Weight Loss Has had evaluation by myself as well as Dr. Wonda Amis. 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  04/25/2017---Weight is stable.  CONSTIPATION  04/25/2017---In past was treating with Linzess, Mirilax Not a current problem.   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 Also he is on aspirin 81 mg daily and this is also contributing to this.   Screening colonoscopy: 06/03/2009. Repeat 79-15 years. Had Colonoscopy --Dr. Rehman--11/26/13--Polyps. Internal and External Hemorrhoids.   Immunizations:  Pneumonia vaccine: Patient reports that he had this at age 66 his prior PCP  .--He would have received  Pneumovax 23.   Prevnar  13 given here 09/02/14.  Tetanus vaccine: Patient recently checked with his insurance and is going to cost him $40 and he deferred.  Zostavax: Patient check with his insurance. Too expensive. The patient defers.  Influenza vaccine: Received this 04/20/14, Has had 04/2017   F/U 3 months, sooner if needed.   Signed,     Signed, [address] [place], [place], [facility] 11/06/2017 8:01 AM

## 2017-11-11 ENCOUNTER — Ambulatory Visit (INDEPENDENT_AMBULATORY_CARE_PROVIDER_SITE_OTHER): Payer: Medicare HMO | Admitting: Physician Assistant

## 2017-11-11 ENCOUNTER — Encounter: Payer: Self-pay | Admitting: Physician Assistant

## 2017-11-11 VITALS — BP 132/78 | HR 72 | Temp 98.1°F | Resp 14 | Ht 68.0 in | Wt 152.2 lb

## 2017-11-11 DIAGNOSIS — Z794 Long term (current) use of insulin: Secondary | ICD-10-CM | POA: Diagnosis not present

## 2017-11-11 DIAGNOSIS — E119 Type 2 diabetes mellitus without complications: Secondary | ICD-10-CM

## 2017-11-11 NOTE — Progress Notes (Addendum)
Patient ID: MOUSTAFA MOSSA MRN: [id number], DOB: 1940-09-02, 77 y.o. Date of Encounter: '@DATE'$ @  Chief Complaint:  Chief Complaint  Patient presents with  . follow up with diabetes    HPI: 77 y.o. year old white male  presents today for routine followup office visit.   On 04/13/2014 he had aortic valve replacement secondary to severe aortic stenosis.  Had Normal coronaries by cardiac catheterization. Preoperatively he had low EF.  At his office visit with me 05/26/14, pt stated that they did give him metformin in the hospital, but said that he had not been taking it since he got home because his blood sugars were low.  He stated that even off of Metformin, his fasting morning blood sugars had been around 138 and 99. At that visit I told him to stay off of metformin. I reviewed the fact that he had experienced further weight loss with the hospitalization and surgery--I'm sure this is contributing to his decreased sugar.  AT OV 12/2014: He states that he checks his blood sugar about once a week. Sometimes fasting or sometimes during the day.  Says this morning fasting reading was 130. Says that he usually gets around 100 to 130 for fasting readings.  States that he takes his dog for a walk every morning. They usually go around the block and then back around. Says that he feels pretty good with this exertion.  He is taking medications as directed with no adverse effects. No lightheadedness. No presyncope.  His last office visit with me prior to the hospitlaization was 02/22/2014. The following is copied from that office visit note.:  At his routine OV 09/28/2013--at that visit I noticed that he looked even thinner that day than usual. I then noticed that I had noted at his prior visit with me 06/29/13 that he had also lost weight at the time of that visit. At that visit , I initially assumed that his weight loss was intentional  secondary to dietary changes to help control his diabetes.    However, at that visit 06/29/13 he reported that he decrease his dietary intake secondary to finances. At that visit he stated   "I just have to eat what I have.   I can't afford anything more."  Weight February 2014   was 167. Weight 06/29/13             was 155. Weight 09/28/13               was 147.  09/28/13---checked labs to evaluate underlying medical causes of weight loss including CBC TSH and CME T. These were all normal.  We also discussed the fact he had a screening colonoscopy 06/03/2009 and he was told to repeat 10-15 years.  He did come in for weight check and his weight was stable at that time. Say when he came in for that we checked our staff gave him some contact information --he could call for some assistance.  At [place] 09/28/13 he says that he is using [employer] Says that they get him through to every other Thursday. He gives very pick this up. Says that he has other phone numbers that he could call for food if he needs to but hasn't needed to so far.  He had colonoscopy by Dr. Jearld Adjutant on 12/12/13. There were polyps which were biopsied. External and internal hemorrhoids. At that time Dr. Wonda Amis planned to order CT of abdomen and pelvis as well as chest.  He  had a CT scan on 12/09/13. No malignancy was seen on either CT scan. No significant finding seen on the CT scans.  Today he says that he continues to  get food once a month from 4 or 5 different places--- [employer] out reach, [employer], [employer], their church.  Weight 12/29/14 remains stable at 146.  At visit 12/29/14 I also reviewed last cardiology note.  Saw NP at Cardiology 11/22/14. Things were stable at that visit and he was to continue current medications. No labs or tests were indicated/ ordered at that time and they plan for follow-up 6 months.  04/04/2015:  He reports that sometimes when he first stands up, his left "hip" "is stiff--takes a minute to straighten up". Says there is no pain, just  stiffness.  Does not want to pursue imaging, further eval right now sec to finances--will monitor. No other complaints or concerns. Has been feeling pretty good. Still checks blood sugar once a week--fasting---gets 120s, 133, "something like that"  10/05/2015: He has no complaints during the visit. Reports he had his follow-up carotid artery ultrasound on Monday but has not heard results yet. He is taking cholesterol medication as directed. No myalgias or other adverse effects. He is taking blood pressure medications as directed with no adverse effects. When he gets up off of the exam table to go to the lab, he moves very slowly and stiff holding to his left hip. Asked what area was bothering him he points to the anterior aspect of the left hip.  10/24/2015: At [facility] 10/05/2015--A1C came back high at 8.9. 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  When I got that lab result, told him to start Metformin 1,040m BID and check fasting BS QAM and come for f/u OV 2 weeks.  Today he states that he is taking the metformin 1000 mg twice a day as directed. Says it is causing him quite a bit of gas and some heartburn. Says that it's only causing a little bit of diarrhea. Says he takes a Tums and that helps the heartburn. Then adds that these symptoms were worse when he first started the Metformin and that the symptoms are getting better.  He does bring in blood sugar log sheet. Fasting morning readings have been 167, 129, 137, 125, 115, 138, 108, 136, 111, 109, 143, 145, 114, 130.  12/22/2015--- Phone message on this date that patient called reporting having diarrhea with the metformin 1000 twice a day. At time he was told to decrease down to 500 mg twice a day.  04/16/2016: Pt reports that he has decreased the metformin down to 500 mg once a day. Says he still has some diarrhea even with this low dose. He checks his blood sugar about twice a week. Says yesterday he got 158 and that was one or 2 hours after he  ate. No other complaints or concerns today. That he is feeling good. Taking blood pressure medication as directed with no lightheadedness or other adverse effects. Taking pravastatin with no myalgias or other adverse effects.   09/27/2016: Today he came in for office visit because "high blood sugar readings". This was not a routine f/u OV. (I dont know why he is overdue for ROV / why he hasnot come in for ROV prior to today) States that he has been getting readings like 350 and 320 and says that these are fasting morning readings. Says he has been getting these kinds of readings for about 2 months now.  I reviewed his Lab from 04/2016. At that time A1c was performed. At that time I had said to add Glucotrol.  Today I asked if he was taking that as directed. He says that caused diarrhea so he is not taking it. I reviewed that  the metformin caused diarrhea in the past but asked if he was confusing this with metformin and he says no- the Glucotrol also cause diarrhea so he quit it. Now he still has some diarrhea but that it was worse with the Glucotrol. ASSESSMENT/PLAN AT THAT OV: 1. Type 2 diabetes mellitus without complication, without long-term current use of insulin (HCC) At this time Blood Sugar reading 236 and A1c 11.0 He cannot take Metformin, Actos, Glucotrol. Is not going to be able to afford multiple medications. At this time will add basal insulin. Staff has shown him how to do this and he has administered first dose here today. He is given 10 units. To then titrate up 1 unit each day until he gets to 16 units. He will then stop/ pause at 16 units. Given him a blood sugar log sheet and he is to document fasting morning readings. Is scheduling follow-up visit with me for this upcoming Monday. Bring the log sheet with him to that appointment. - Hemoglobin A1C, fingerstick - Glucose, fingerstick (stat) - Insulin Glargine (LANTUS SOLOSTAR) 100 UNIT/ML Solostar Pen; Give # units as directed at  office visit, up to 30 units daily.  Dispense: 5 pen; Refill: PRN   10/01/2016: Today he does bring in his blood sugar log sheet that we gave him at last office visit. He does have documented that he has been increasing number of units of insulin by 1 each day since that visit. Also has documented that his fasting blood sugars have been (in this chronological order according to the date): 285, 200, 267, 198. Today also reviewed the fact that he is unable to take Actos because of his cardiac issues. Can not take metformin or glipizide because each of these causes diarrhea. Verified with him again today that his diarrhea does get better when he stops the glipizide. Says that his Insulin is going to cost > $200  Assessment/Plan at that OV: 1. Type 2 diabetes mellitus without complication, without long-term current use of insulin ([place]) Today I had Maudie Mercury talk to him regarding finances and assistance that may be available to help with this as well as finding out which insulin will have the lowest co-pay with his insurance coverage. Today I have told him to continue increasing the insulin by 1 unit per day until he gets fasting sugars reading around 150. When sugar reading around 150 then he will pause and continue that number of units. At this time will add Januvia 100 mg once daily. Renal function was normal on last lab. He will continue to document fasting sugars every morning. He will have follow-up visit with me in one week.   10/08/2016: He states that he is taking the Januvia 100 mg daily. He got it at the local pharmacy and is waiting for the mail order to come in. He will pay $125 for 90 day supply. Currently using the sample of the Basaglar that we gave him at last visit. Says that he has sent in for the mail order and that his wife said it supposed to be free. Has no questions or concerns today. Having no adverse effects with the medicines.  Does bring in his blood sugar log form. Has been  increasing by 1 unit each day. The last 6 fasting blood sugar readings have been 240, 182, 158, 176, 276, 173. He is currently at 17 units daily. A/P at that OV: Today I gave him 4 weeks worth of samples of the Januvia 100 mg daily. Today I gave him samples of 2 more of the [employer] I have written # units daily on his BS log sheet---He is to continue increasing by 1 unit daily until he gets to 20 units and then we will continue 20 units daily. Will continue on the 20 units daily until he has follow-up visit in 1-2 weeks. Bring the blood sugar log form with him to that visit. F/U sooner if needed if any questions or concerns.   Addendum added 10/10/2016:  Patient's wife her for OV with me. Reports that Januvia is causing husband to have diarrhea. I checked for samples of the 41m dose so he could try this dose but we have no Januvia 548msamples. Therefore told her to have him just stop the Januvia until his follow-up visit which she says is scheduled in just a couple weeks.  10/24/2016: He reports that he did stop the Januvia. The diarrhea did resolve.  He did increase Insulin by one unit until he got to 20 units and has continued 20 units daily since.  He does bring in BS log-- Fasting morning readings since getting to 20 units are : 189, 178, 171, 161, 143, 142, 162, 140, 178, 140, 148. A/P at this OV: Will have him increase to 22 units daily and stay at that dose.  I checked for samples of Januvia 5030mgain today but we have none.  At this time will D/C Januvia and try Jardiance 48m57m 2 boxes of samples given (total 14 tablets)    01/24/2017: Today he reports he has been feeling very well. Is that his granddaughter is living with them for the summer and that she is looking into joining the Y and he is asking about whether be okay for him to swim if they joined the Y. I told him that that would be excellent exercise for him and highly encouraged him to do this. Told him to just be careful  not to hit his toes or feet but otherwise encouraged him to do that and for him to see if there are any classes offered especially senior classes for water aerobics.  He is currently taking his insulin at 22 units daily. He states that financially it is difficult to have the money for every prescriptions that he has been coming here and getting some samples. As well today I have given him 4 sample pens of Basaglar.  He brings in his blood sugar log. He only checks fasting morning readings but he does check this every day and knees are excellent. For the month of June since June 1 he has been getting the following readings: 114, 112, 153, 120, 108, 105, 166, 113, 125, 135, 120, 139, 198, 137, 118, 139, 149.  I did review telephone notes that are documented from 11/05/2016. This documents that Jardiance -- for him was going to cost over $100 co-pay and that was too expensive. I then reviewed the fact that he cannot tolerate metformin, Actos, Glucotrol. Can not afford Januvia secondary to cost either. Therefore plan to just have him take insulin with no oral medicine for diabetes. Patient was informed of this. He also was [facility]  to get assistance for pain for his medicines.  He is taking blood pressure medications as directed. Having no lightheadedness or other adverse effects.  He is taking his pravastatin daily. No myalgias or right upper quadrant pain.  He has no concerns to address today.   04/25/2017: Today he reports that he has continued to feel good. States that he does occasionally get some minor discomfort going down the lateral aspect of his right leg. Discussed with him that this is consistent with right sciatica. Says that it really doesn't hurt bad just something a little annoying every now and then. Comes and goes intermittently. Discussed some stretches that he could do if this does flare and follow-up if it worsens. Otherwise, has been  feeling good. He is using his insulin as directed. Did not bring in blood sugar log form for me to review today. Continues to be very careful with his feet and provide excellent foot care. Still cannot afford the co-pay to go to for eye exam. He is taking blood pressure medications as directed. Having no lightheadedness or other adverse effects. He is taking his pravastatin daily. No myalgias or right upper quadrant pain.  07/25/2017: No specific concerns today.  Says that he has been feeling fine.  That he had been writing down his blood sugars on the form but then left the form at home and forgot to bring it with him today. He is using his insulin as directed. Continues to be very careful with his feet and provide excellent foot care. Still cannot afford the co-pay to go to for eye exam. He is taking blood pressure medications as directed. Having no lightheadedness or other adverse effects. He is taking his pravastatin daily. No myalgias or right upper quadrant pain.   10/24/2017:  He reports that he has been feeling good and everything has seemed to be stable as far as he can tell.  Has no specific concerns to address today. He is using his insulin as directed. Continues to be very careful with his feet and provide excellent foot care. Still cannot afford the co-pay to go to for eye exam. He is currently giving insulin 22 units daily.  States that he checks his blood sugar fasting every morning.  Says that he sometimes gets around 120s or 140s but sometimes higher towards 160s or 180s.  Never gets any low readings less than 100--never low near 80. He is taking blood pressure medications as directed. Having no lightheadedness or other adverse effects. He is taking his pravastatin daily. No myalgias or right upper quadrant pain. The end of the visit as we are going to the lab he tells me that his left elbow has been hurting recently.  States that he has been doing no repetitive motion that he is  aware of.  Symptoms are consistent with tendinitis.  Reviewed that his creatinine has been normal at last lab.  Prescribed Mobic for him to take daily with food for 2 weeks. The end of the visit as we were in the lab he also asked for samples of insulin.  Gave him 2 basaglar insulin pens.   11/06/2017: Office visit 10/24/2017 A1c came back 8.5. Prior to that time, he had been administering 22 units of basal insulin daily. At that time I recommended for him to increase to 24 units of basal insulin daily.  Check fasting morning blood sugars and document every morning and bring into follow-up office visit in 1 week. He presents for that follow-up  visit today. He reports that he did increase the insulin up to 24 units daily.   Since increasing to 24 units his fasting morning blood sugars have been: 194, 170, 159, 154, 160, 148, 245, 176, 148, 211. The readings in February when he was still on the 22 units were very similar.  Actually overall blood sugars actually were better back around January and February. Today I have given him a new blood sugar log form and have filled in 24 units-----11/04/17------ 176 24 units------ 11/05/17------ 148 24 units------- 11/06/17---------- 211 25 units--------- 11/07/17---------- 26 units--------- 11/08/17-------- 27 units----------- 11/09/17----------  He is to continue to increase by 1 unit each day until fasting blood sugars are around 110 - 150.  When he gets to blood sugar around 110 - 150 then he will pause and continue at that dose of insulin. Will have him come in for follow-up visit in 1 week.     11/11/2017:  24-------------4/1-------176 24-------------4/2--------148 24-------------4/3-------211 25-------------4/4-----147 26-------------4/5-----178 27-------------4/6-----194 28-------------4/7-----156 29-------------4/8-----159 32-------------4/9------ 32-------------4/10---- 32   At this point, I am going to have him put the insulin at 32 units  daily and continue at 32 units daily. He is to call me if he is getting consistent readings down near 110. 10/24/17 A1c came back 8.5. Prior to 10/24/2017 he was giving 22 units daily. On average, would expect that if we increase insulin by 10 units should bring A1c down about a point so if we get this to 32 units daily would expect A1c to come to about 7.5.  Also, review of his recent blood sugars he definitely will not be getting too low at 32 units daily. Will have him do 32 units daily and can wait to return for routine visit 3 months.  Follow-up sooner if needed.   Addendum Added--received Note-- He had Eye Exam 12/16/2017--- Note given to staff to document in [facility]    Past Medical History:  Diagnosis Date  . Aortic stenosis, mild   . BPH (benign prostatic hypertrophy)   . CAD (coronary artery disease)   . 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 Constipation   . Diabetes mellitus   . GERD (gastroesophageal reflux disease)   . History of cataract   . Hyperlipidemia   . Hypertension   . Nephrolithiasis   . Vitamin D deficiency      Home Meds: Outpatient Medications Prior to Visit  Medication Sig Dispense Refill  . aspirin 81 MG EC tablet Take 1 tablet (81 mg total) by mouth daily. Swallow whole. 90 tablet 1  . Blood Glucose Monitoring Suppl (ONE TOUCH ULTRA SYSTEM KIT) w/Device KIT Check blood sugar twice daily. 1 each 0  . Cholecalciferol (VITAMIN D) 2000 units tablet Take 1 tablet (2,000 Units total) by mouth daily. 90 tablet 1  . fish oil-omega-3 fatty acids 1000 MG capsule Take 1 g by mouth daily.     Marland Kitchen glucose blood test strip Check blood sugar (2) twice daily.Please dispense based on insurance preference ICD 10 E11.9,Z79.4 100 each 12  . Insulin Glargine (BASAGLAR KWIKPEN) 100 UNIT/ML SOPN Inject 0.22 mLs (22 Units total) into the skin daily. Give number of units as directed at office visit, up to 30 units daily. (Patient taking differently: Inject 22 Units into  the skin daily. Give number of units as directed at office visit, up to 30 units daily. Giving 29 units) 15 pen 3  . Insulin Pen Needle (BD PEN NEEDLE NANO U/F) 32G X 4  MM MISC Use to check blood sugar daily 90 each 2  . Lancets (ONETOUCH ULTRASOFT) lancets Use to check blood sugar. Dispensed based on insurance preference ICD10 E11.9, Z79.4 100 each 12  . lisinopril (PRINIVIL,ZESTRIL) 2.5 MG tablet Take 1 tablet (2.5 mg total) by mouth daily. 90 tablet 1  . meloxicam (MOBIC) 7.5 MG tablet Take 1 tablet (7.5 mg total) by mouth daily. Take with food. 30 tablet 0  . metoprolol succinate (TOPROL-XL) 50 MG 24 hr tablet TAKE 1 TABLET BY MOUTH  DAILY WITH OR IMMEDIATELY  FOLLOWING A MEAL 90 tablet 1  . omeprazole (PRILOSEC) 20 MG capsule Take 1 capsule (20 mg total) by mouth daily. 90 capsule 1  . pravastatin (PRAVACHOL) 40 MG tablet Take 1 tablet (40 mg total) by mouth daily. 90 tablet 0   No facility-administered medications prior to visit.      Allergies:  Allergies  Allergen Reactions  . Actos [Pioglitazone] Other (See Comments)    Cramps in legs   . Metformin And Related Diarrhea  . Morphine     REACTION: hives  . Statins     REACTION: leg cramps but tolerates pravastatin    Social History   Socioeconomic History  . Marital status: Married    Spouse name: Not on file  . Number of children: Not on file  . Years of education: Not on file  . Highest education level: Not on file  Occupational History  . Not on file  Social Needs  . Financial resource strain: Not on file  . Food insecurity:    Worry: Not on file    Inability: Not on file  . Transportation needs:    Medical: Not on file    Non-medical: Not on file  Tobacco Use  . Smoking status: Former Smoker    Packs/day: 1.00    Years: 30.00    Pack years: 30.00    Last attempt to quit: 06/10/1999    Years since quitting: 18.4  . Smokeless tobacco: Former Systems developer    Types: Chew    Quit date: 04/09/2014  . Tobacco comment:  Quit >15 years ago  Substance and Sexual Activity  . Alcohol use: No    Alcohol/week: 0.0 oz  . Drug use: No  . Sexual activity: Not Currently  Lifestyle  . Physical activity:    Days per week: Not on file    Minutes per session: Not on file  . Stress: Not on file  Relationships  . Social connections:    Talks on phone: Not on file    Gets together: Not on file    Attends religious service: Not on file    Active member of club or organization: Not on file    Attends meetings of clubs or organizations: Not on file    Relationship status: Not on file  . Intimate partner violence:    Fear of current or ex partner: Not on file    Emotionally abused: Not on file    Physically abused: Not on file    Forced sexual activity: Not on file  Other Topics Concern  . Not on file  Social History Narrative   Married for >44 years    Family History  Problem Relation Age of Onset  . Heart disease Mother   . Congestive Heart Failure Father   . Heart failure Sister 44     Review of Systems:  See HPI for pertinent ROS. All other ROS negative.    Physical  Exam: Blood pressure 132/78, pulse 72, temperature 98.1 F (36.7 C), temperature source Oral, resp. rate 14, height '5\' 8"'$  (1.727 m), weight 69 kg (152 lb 3.2 oz), SpO2 98 %., Body mass index is 23.14 kg/m. General: Thin WM. Appears in no acute distress. Neck: Bilateral Carotid Bruits.  Lungs: Crackles at bases bilaterally. O/w clear.  Heart: RRR with S1 S2. II/VI murmur at 2nd ICS on Right Musculoskeletal:  Strength and tone normal for age. Extremities/Skin: Warm and dry.  No edema.  Forearms with multiple scattered areas of deep reddish-purplish purpura.  Neuro: Alert and oriented X 3. Moves all extremities spontaneously. Gait is normal. CNII-XII grossly in tact. Psych:  Responds to questions appropriately with a normal affect. Diabetic Foot Exam: Inspection: Normal. No wounds, no callouses, worisome areas. Sensation intact.  2+ DP  pulses bilaterally. Trace PT pulses bilaterally.      ASSESSMENT AND PLAN:  77 y.o. year old male with    1. Type 2 diabetes mellitus without complication, with long-term current use of insulin (HCC)  At this point, I am going to have him put the insulin at 32 units daily and continue at 32 units daily. He is to call me if he is getting consistent readings down near 110. 10/24/17 A1c came back 8.5. Prior to 10/24/2017 he was giving 22 units daily. On average, would expect that if we increase insulin by 10 units should bring A1c down about a point so if we get this to 32 units daily would expect A1c to come to about 7.5.  Also, review of his recent blood sugars he definitely will not be getting too low at 32 units daily. Will have him do 32 units daily and can wait to return for routine visit 3 months.  Follow-up sooner if needed.   Addendum Added--received Note-- He had Eye Exam 12/16/2017--- Note given to staff to document in HM       -----------------------THE FOLLOWING IS COPIED, PULLED FORWARD FROM OV 10/24/2017----THE FOLLOWING WAS NOT ADDRESSED AT OV 11/06/2017 or 11/11/2017:-----------------------------------------------  1. Type 2 diabetes mellitus without complication, with long-term current use of insulin (HCC)  In the past---11/2016-- Jardiance -- for him was going to cost over $100 co-pay and that was too expensive. I then reviewed the fact that he cannot tolerate metformin, Actos, Glucotrol. Can not afford Januvia secondary to cost either. Therefore plan to just have him take insulin with no oral medicine for diabetes. Patient was informed of this. He also was given a number to [facility] where he can call to get assistance for pain for his medicines.  He provides excellent foot care. Reminded him of proper foot care and wearing shoes at all times.   He is overdue for ophthalmology exam. He states that he cannot afford this. He has a high co-pay.   He is on ASA '81mg'$ ,  on ACE inhibitor, and on statin therapy.  10/24/2017: Check A1c to monitor.  Addendum Added--received Note-- He had Eye Exam 12/16/2017--- Note given to staff to document in HM     Subclinical hypothyroidism 07/25/2017: Check labs to monitor - TSH - T4, free 10/24/2017: Last labs were stable.  Wait to recheck 6 months.  Osteopenia determined by x-ray Prior X-ray showed osteopenia. Patient has never had bone density scan. Discussed having bone density scan and he is agreeable. - DG Bone Density; Future Addendum Added 11/14/15--- DEXA shows Osteoporosis.  Pt to start Fosamax '70mg'$  once weekly. 07/25/2017: Reports that he stopped the Fosamax.  Says that it caused his stomach to hurt and caused him to have vomiting. ----------------- Given his financial distress and his age, hold off on any different treatments for this at this point.  Osteoporosis 01/24/2017: --S/P DEXA 11/2015 01/24/2017: --Started Fosamax 11/14/2015  04/25/2017--continue the Fosamax. 07/25/2017: Reports that he stopped the Fosamax.  Says that it caused his stomach to hurt and caused him to have vomiting. ----------------- Given his financial distress and his age, hold off on any different treatments for this at this point.   S/P AVR (aortic valve replacement) 10/24/2017:  -Stable. -Managed by Cardiology  Cardiomyopathy, dilated, nonischemic 10/24/2017:   -Stable. Managed by Cardiology  Carotid artery stenosis, bilateral In the past, cardiology was ordering his carotid artery ultrasounds. However, patient had stopped following up with cardiology as he could not afford to pay the co-pay. Because of this financial situation, I  Ordered Echo and carotid artery Dopplers more recently.  And I ordered another set of carotid Dopplers which was performed on 10/01/14. Stable. 60-79% bilateral ICA stenosis. Follow-up one year. --F/U performed 09/2015:  Stable. 60-79% bilateral ICA stenosis. Follow-up one year.  01/24/2017:    He had follow-up carotid artery ultrasound 11/12/16. I did send a message to Dr. Harl Bowie and he did follow-up with me and verified that recommendation a follow-up in one year. Repeat carotid ultrasound one year. Bilateral carotid bruits - see  above 07/25/2017: Had follow-up carotid Doppler on 06/24/17.  Carotid evidence of right ICA of 1-39% stenosis.  Left carotid there was left ICA 1-39% stenosis. F/U 12 months.    Hyperlipidemia 07/25/2017: On pravastatin. Check FLP LFT now. He is fasting. 10/24/2017:  Reviewed that [place] 07/25/17 LDL was 107 but continued current dose of pravastatin because he has had a history of myalgias with multiple statins in the past.   Essential hypertension 10/24/2017:   Blood pressure is at goal. Continue current medications.Check lab to monitor On ACE inhibitor and beta blocker.   Weight Loss Has had evaluation by myself as well as Dr. Wonda Amis. Also, at [place] 04/04/2015---Realized that he has no lower teeth and that those dentures have been broken for long time and he cannot afford new ones--he says he has to eat soft foods.  04/25/2017---Weight is stable.  CONSTIPATION  04/25/2017---In past was treating with Linzess, Mirilax Not a current problem.   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 Also he is on aspirin 81 mg daily and this is also contributing to this.   Screening colonoscopy: 06/03/2009. Repeat 10-15 years. Had Colonoscopy --Dr. Rehman--11/26/13--Polyps. Internal and External Hemorrhoids.   Immunizations:  Pneumonia vaccine: Patient reports that he had this at age 6 his prior PCP  .--He would have received  Pneumovax 23.   Prevnar  13 given here 09/02/14.  Tetanus vaccine: Patient recently checked with his insurance and is going to cost him $40 and he deferred.  Zostavax: Patient check with his insurance. Too expensive. The patient defers.  Influenza vaccine: Received this 04/20/14,  Has had 04/2017   F/U 3 months, sooner if needed.   Signed,     Signed, [address] [place], [place], [facility] 11/11/2017 9:21 AM

## 2017-11-12 ENCOUNTER — Other Ambulatory Visit: Payer: Self-pay | Admitting: Physician Assistant

## 2017-11-12 DIAGNOSIS — I6523 Occlusion and stenosis of bilateral carotid arteries: Secondary | ICD-10-CM

## 2017-11-20 ENCOUNTER — Other Ambulatory Visit: Payer: Self-pay | Admitting: Physician Assistant

## 2017-11-22 ENCOUNTER — Encounter: Payer: Self-pay | Admitting: Cardiology

## 2017-11-22 ENCOUNTER — Ambulatory Visit (INDEPENDENT_AMBULATORY_CARE_PROVIDER_SITE_OTHER): Payer: Medicare HMO | Admitting: Cardiology

## 2017-11-22 VITALS — BP 114/64 | HR 74 | Ht 68.0 in | Wt 151.0 lb

## 2017-11-22 DIAGNOSIS — I350 Nonrheumatic aortic (valve) stenosis: Secondary | ICD-10-CM

## 2017-11-22 DIAGNOSIS — I6523 Occlusion and stenosis of bilateral carotid arteries: Secondary | ICD-10-CM | POA: Diagnosis not present

## 2017-11-22 DIAGNOSIS — I10 Essential (primary) hypertension: Secondary | ICD-10-CM

## 2017-11-22 DIAGNOSIS — E782 Mixed hyperlipidemia: Secondary | ICD-10-CM
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
## 2017-11-22 NOTE — Patient Instructions (Signed)
Medication Instructions:  Your physician recommends that you continue on your current medications as directed. Please refer to the Current Medication list given to you today.   Labwork: NONE   Testing/Procedures: NONE   Follow-Up: Your physician wants you to follow-up in: 6 Months. You will receive a reminder letter in the mail two months in advance. If you don't receive a letter, please call our office to schedule the follow-up appointment.   Any Other Special Instructions Will Be Listed Below (If Applicable).     If you need a refill on your cardiac medications before your next appointment, please call your pharmacy.  Thank you for choosing [facility]!   

## 2017-11-26 ENCOUNTER — Ambulatory Visit (HOSPITAL_COMMUNITY): Admission: RE | Admit: 2017-11-26 | Payer: Medicare HMO | Source: Ambulatory Visit

## 2017-11-27 ENCOUNTER — Encounter: Payer: Self-pay | Admitting: Cardiology

## 2017-12-02 ENCOUNTER — Other Ambulatory Visit: Payer: Self-pay | Admitting: Physician Assistant

## 2017-12-16 LAB — HM DIABETES EYE EXAM

## 2017-12-20 ENCOUNTER — Telehealth: Payer: Self-pay | Admitting: Physician Assistant

## 2017-12-20 NOTE — Telephone Encounter (Signed)
Patient called in stating that he is currently out of [place] and would like to know if we have any samples that we can give him. I informed patient that we have two samples and he may come pick them up. Patient verbalized understanding.

## 2018-01-13 ENCOUNTER — Telehealth: Payer: Self-pay | Admitting: Physician Assistant

## 2018-01-13 NOTE — Telephone Encounter (Signed)
Patient needs test strips called into [facility] [place] [facility] if possible  [phone]

## 2018-01-14 MED ORDER — GLUCOSE BLOOD VI STRP: ORAL_STRIP | 12 refills | Status: DC

## 2018-01-14 NOTE — Telephone Encounter (Signed)
Test strips sent to [facility]

## 2018-01-24 DIAGNOSIS — R69 Illness, unspecified: Secondary | ICD-10-CM | POA: Diagnosis not present

## 2018-01-27 ENCOUNTER — Encounter: Payer: Self-pay | Admitting: Physician Assistant

## 2018-01-27 ENCOUNTER — Ambulatory Visit (INDEPENDENT_AMBULATORY_CARE_PROVIDER_SITE_OTHER): Payer: Medicare HMO | Admitting: Physician Assistant

## 2018-01-27 ENCOUNTER — Other Ambulatory Visit: Payer: Self-pay

## 2018-01-27 VITALS — BP 128/74 | HR 64 | Temp 97.8°F | Resp 14 | Ht 68.0 in | Wt 149.6 lb

## 2018-01-27 DIAGNOSIS — I350 Nonrheumatic aortic (valve) stenosis: Secondary | ICD-10-CM | POA: Diagnosis not present

## 2018-01-27 DIAGNOSIS — Z794 Long term (current) use of insulin: Secondary | ICD-10-CM | POA: Diagnosis not present

## 2018-01-27 DIAGNOSIS — I6529 Occlusion and stenosis of unspecified carotid artery: Secondary | ICD-10-CM

## 2018-01-27 DIAGNOSIS — E119 Type 2 diabetes mellitus without complications: Secondary | ICD-10-CM

## 2018-01-27 DIAGNOSIS — D692 Other nonthrombocytopenic purpura: Secondary | ICD-10-CM | POA: Diagnosis not present

## 2018-01-27 DIAGNOSIS — E039 Hypothyroidism, unspecified: Secondary | ICD-10-CM

## 2018-01-27 DIAGNOSIS — I359 Nonrheumatic aortic valve disorder, unspecified: Secondary | ICD-10-CM

## 2018-01-27 DIAGNOSIS — E038 Other specified hypothyroidism: Secondary | ICD-10-CM

## 2018-01-27 DIAGNOSIS — I420 Dilated cardiomyopathy: Secondary | ICD-10-CM

## 2018-01-27 DIAGNOSIS — E785 Hyperlipidemia, unspecified: Secondary | ICD-10-CM

## 2018-01-27 DIAGNOSIS — I5022 Chronic systolic (congestive) heart failure: Secondary | ICD-10-CM

## 2018-01-27 DIAGNOSIS — Z952 Presence of prosthetic heart valve: Secondary | ICD-10-CM

## 2018-01-27 DIAGNOSIS — Z86010 Personal history of colonic polyps: Secondary | ICD-10-CM

## 2018-01-27 DIAGNOSIS — M8580 Other specified disorders of bone density and structure, unspecified site: Secondary | ICD-10-CM

## 2018-01-27 DIAGNOSIS — R0989 Other specified symptoms and signs involving the circulatory and respiratory systems: Secondary | ICD-10-CM | POA: Diagnosis not present

## 2018-01-27 DIAGNOSIS — I10 Essential (primary) hypertension: Secondary | ICD-10-CM | POA: Diagnosis not present

## 2018-01-27 NOTE — Progress Notes (Signed)
Patient ID: [name] MRN: [id number], DOB: [birth year], 77 y.o. Date of Encounter: @DATE @  Chief Complaint:  Chief Complaint  Patient presents with  . 3 month follow up diabetes  . follow up with cholesterol    HPI: 77 y.o. year old white male  presents today for routine followup office visit.   On 04/13/2014 he had aortic valve replacement secondary to severe aortic stenosis.  Had Normal coronaries by cardiac catheterization. Preoperatively he had low EF.  At his office visit with me 05/26/14, pt stated that they did give him metformin in the hospital, but said that he had not been taking it since he got home because his blood sugars were low.  He stated that even off of Metformin, his fasting morning blood sugars had been around 138 and 99. At that visit I told him to stay off of metformin. I reviewed the fact that he had experienced further weight loss with the hospitalization and surgery--I'm sure this is contributing to his decreased sugar.  AT OV 12/2014: He states that he checks his blood sugar about once a week. Sometimes fasting or sometimes during the day.  Says this morning fasting reading was 130. Says that he usually gets around 100 to 130 for fasting readings.  States that he takes his dog for a walk every morning. They usually go around the block and then back around. Says that he feels pretty good with this exertion.  He is taking medications as directed with no adverse effects. No lightheadedness. No presyncope.  His last office visit with me prior to the hospitlaization was 02/22/2014. The following is copied from that office visit note.:  At his routine OV 09/28/2013--at that visit I noticed that he looked even thinner that day than usual. I then noticed that I had noted at his prior visit with me 06/29/13 that he had also lost weight at the time of that visit. At that visit , I initially assumed that his weight loss was intentional  secondary to dietary  changes to help control his diabetes.  However, at that visit 06/29/13 he reported that he decrease his dietary intake secondary to finances. At that visit he stated   "I just have to eat what I have.   I can't afford anything more."  Weight February 2014   was 167. Weight 06/29/13             was 155. Weight 09/28/13               was 147.  09/28/13---checked labs to evaluate underlying medical causes of weight loss including CBC TSH and CME T. These were all normal.  We also discussed the fact he had a screening colonoscopy 06/03/2009 and he was told to repeat 10-15 years.  He did come in for weight check and his weight was stable at that time. Say when he came in for that we checked our staff gave him some contact information --he could call for some assistance.  At [place] 09/28/13 he says that he is using [employer] Says that they get him through to every other Thursday. He gives very pick this up. Says that he has other phone numbers that he could call for food if he needs to but hasn't needed to so far.  He had colonoscopy by Dr. Jearld Adjutant on 12/12/13. There were polyps which were biopsied. External and internal hemorrhoids. At that time Dr. Wonda Amis planned to order CT of abdomen and pelvis  as well as chest.  He had a CT scan on 12/09/13. No malignancy was seen on either CT scan. No significant finding seen on the CT scans.  Today he says that he continues to  get food once a month from 4 or 5 different places--- [employer] out reach, [employer], [employer], their church.  Weight 12/29/14 remains stable at 146.  At visit 12/29/14 I also reviewed last cardiology note.  Saw NP at Cardiology 11/22/14. Things were stable at that visit and he was to continue current medications. No labs or tests were indicated/ ordered at that time and they plan for follow-up 6 months.  04/04/2015:  He reports that sometimes when he first stands up, his left "hip" "is stiff--takes a minute to straighten  up". Says there is no pain, just stiffness.  Does not want to pursue imaging, further eval right now sec to finances--will monitor. No other complaints or concerns. Has been feeling pretty good. Still checks blood sugar once a week--fasting---gets 120s, 133, "something like that"  10/05/2015: He has no complaints during the visit. Reports he had his follow-up carotid artery ultrasound on Monday but has not heard results yet. He is taking cholesterol medication as directed. No myalgias or other adverse effects. He is taking blood pressure medications as directed with no adverse effects. When he gets up off of the exam table to go to the lab, he moves very slowly and stiff holding to his left hip. Asked what area was bothering him he points to the anterior aspect of the left hip.  10/24/2015: At [place] 10/05/2015--A1C came back high at 8.9. 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  When I got that lab result, told him to start Metformin 1,033m BID and check fasting BS QAM and come for f/u OV 2 weeks.  Today he states that he is taking the metformin 1000 mg twice a day as directed. Says it is causing him quite a bit of gas and some heartburn. Says that it's only causing a little bit of diarrhea. Says he takes a Tums and that helps the heartburn. Then adds that these symptoms were worse when he first started the Metformin and that the symptoms are getting better.  He does bring in blood sugar log sheet. Fasting morning readings have been 167, 129, 137, 125, 115, 138, 108, 136, 111, 109, 143, 145, 114, 130.  12/22/2015--- Phone message on this date that patient called reporting having diarrhea with the metformin 1000 twice a day. At time he was told to decrease down to 500 mg twice a day.  04/16/2016: Pt reports that he has decreased the metformin down to 500 mg once a day. Says he still has some diarrhea even with this low dose. He checks his blood sugar about twice a week. Says yesterday he got 158 and  that was one or 2 hours after he ate. No other complaints or concerns today. That he is feeling good. Taking blood pressure medication as directed with no lightheadedness or other adverse effects. Taking pravastatin with no myalgias or other adverse effects.   09/27/2016: Today he came in for office visit because "high blood sugar readings". This was not a routine f/u OV. (I dont know why he is overdue for ROV / why he hasnot come in for ROV prior to today) States that he has been getting readings like 350 and 320 and says that these are fasting morning readings. Says he has been getting these kinds of readings  for about 2 months now.  I reviewed his Lab from 04/2016. At that time A1c was performed. At that time I had said to add Glucotrol.  Today I asked if he was taking that as directed. He says that caused diarrhea so he is not taking it. I reviewed that  the metformin caused diarrhea in the past but asked if he was confusing this with metformin and he says no- the Glucotrol also cause diarrhea so he quit it. Now he still has some diarrhea but that it was worse with the Glucotrol. ASSESSMENT/PLAN AT THAT OV: 1. Type 2 diabetes mellitus without complication, without long-term current use of insulin (HCC) At this time Blood Sugar reading 236 and A1c 11.0 He cannot take Metformin, Actos, Glucotrol. Is not going to be able to afford multiple medications. At this time will add basal insulin. Staff has shown him how to do this and he has administered first dose here today. He is given 10 units. To then titrate up 1 unit each day until he gets to 16 units. He will then stop/ pause at 16 units. Given him a blood sugar log sheet and he is to document fasting morning readings. Is scheduling follow-up visit with me for this upcoming Monday. Bring the log sheet with him to that appointment. - Hemoglobin A1C, fingerstick - Glucose, fingerstick (stat) - Insulin Glargine (LANTUS SOLOSTAR) 100 UNIT/ML Solostar  Pen; Give # units as directed at office visit, up to 30 units daily.  Dispense: 5 pen; Refill: PRN   10/01/2016: Today he does bring in his blood sugar log sheet that we gave him at last office visit. He does have documented that he has been increasing number of units of insulin by 1 each day since that visit. Also has documented that his fasting blood sugars have been (in this chronological order according to the date): 285, 200, 267, 198. Today also reviewed the fact that he is unable to take Actos because of his cardiac issues. Can not take metformin or glipizide because each of these causes diarrhea. Verified with him again today that his diarrhea does get better when he stops the glipizide. Says that his Insulin is going to cost > $200  Assessment/Plan at that OV: 1. Type 2 diabetes mellitus without complication, without long-term current use of insulin ([facility]) Today I had Maudie Mercury talk to him regarding finances and assistance that may be available to help with this as well as finding out which insulin will have the lowest co-pay with his insurance coverage. Today I have told him to continue increasing the insulin by 1 unit per day until he gets fasting sugars reading around 150. When sugar reading around 150 then he will pause and continue that number of units. At this time will add Januvia 100 mg once daily. Renal function was normal on last lab. He will continue to document fasting sugars every morning. He will have follow-up visit with me in one week.   10/08/2016: He states that he is taking the Januvia 100 mg daily. He got it at the local pharmacy and is waiting for the mail order to come in. He will pay $125 for 90 day supply. Currently using the sample of the Basaglar that we gave him at last visit. Says that he has sent in for the mail order and that his wife said it supposed to be free. Has no questions or concerns today. Having no adverse effects with the medicines.  Does bring in his  blood sugar log form. Has been increasing by 1 unit each day. The last 6 fasting blood sugar readings have been 240, 182, 158, 176, 276, 173. He is currently at 17 units daily. A/P at that OV: Today I gave him 4 weeks worth of samples of the Januvia 100 mg daily. Today I gave him samples of 2 more of the [employer] I have written # units daily on his BS log sheet---He is to continue increasing by 1 unit daily until he gets to 20 units and then we will continue 20 units daily. Will continue on the 20 units daily until he has follow-up visit in 1-2 weeks. Bring the blood sugar log form with him to that visit. F/U sooner if needed if any questions or concerns.   Addendum added 10/10/2016:  Patient's wife her for OV with me. Reports that Januvia is causing husband to have diarrhea. I checked for samples of the 53m dose so he could try this dose but we have no Januvia 544msamples. Therefore told her to have him just stop the Januvia until his follow-up visit which she says is scheduled in just a couple weeks.  10/24/2016: He reports that he did stop the Januvia. The diarrhea did resolve.  He did increase Insulin by one unit until he got to 20 units and has continued 20 units daily since.  He does bring in BS log-- Fasting morning readings since getting to 20 units are : 189, 178, 171, 161, 143, 142, 162, 140, 178, 140, 148. A/P at this OV: Will have him increase to 22 units daily and stay at that dose.  I checked for samples of Januvia 5078mgain today but we have none.  At this time will D/C Januvia and try Jardiance 69m80m 2 boxes of samples given (total 14 tablets)    01/24/2017: Today he reports he has been feeling very well. Is that his granddaughter is living with them for the summer and that she is looking into joining the Y and he is asking about whether be okay for him to swim if they joined the Y. I told him that that would be excellent exercise for him and highly encouraged him to do  this. Told him to just be careful not to hit his toes or feet but otherwise encouraged him to do that and for him to see if there are any classes offered especially senior classes for water aerobics.  He is currently taking his insulin at 22 units daily. He states that financially it is difficult to have the money for every prescriptions that he has been coming here and getting some samples. As well today I have given him 4 sample pens of Basaglar.  He brings in his blood sugar log. He only checks fasting morning readings but he does check this every day and knees are excellent. For the month of June since June 1 he has been getting the following readings: 114, 112, 153, 120, 108, 105, 166, 113, 125, 135, 120, 139, 198, 137, 118, 139, 149.  I did review telephone notes that are documented from 11/05/2016. This documents that Jardiance -- for him was going to cost over $100 co-pay and that was too expensive. I then reviewed the fact that he cannot tolerate metformin, Actos, Glucotrol. Can not afford Januvia secondary to cost either. Therefore plan to just have him take insulin with no oral medicine for diabetes. Patient was informed of this. He also was given a number to  [facility] where he can call to get assistance for pain for his medicines.  He is taking blood pressure medications as directed. Having no lightheadedness or other adverse effects.  He is taking his pravastatin daily. No myalgias or right upper quadrant pain.  He has no concerns to address today.   04/25/2017: Today he reports that he has continued to feel good. States that he does occasionally get some minor discomfort going down the lateral aspect of his right leg. Discussed with him that this is consistent with right sciatica. Says that it really doesn't hurt bad just something a little annoying every now and then. Comes and goes intermittently. Discussed some stretches that he could do if this does flare and follow-up if it  worsens. Otherwise, has been feeling good. He is using his insulin as directed. Did not bring in blood sugar log form for me to review today. Continues to be very careful with his feet and provide excellent foot care. Still cannot afford the co-pay to go to for eye exam. He is taking blood pressure medications as directed. Having no lightheadedness or other adverse effects. He is taking his pravastatin daily. No myalgias or right upper quadrant pain.  07/25/2017: No specific concerns today.  Says that he has been feeling fine.  That he had been writing down his blood sugars on the form but then left the form at home and forgot to bring it with him today. He is using his insulin as directed. Continues to be very careful with his feet and provide excellent foot care. Still cannot afford the co-pay to go to for eye exam. He is taking blood pressure medications as directed. Having no lightheadedness or other adverse effects. He is taking his pravastatin daily. No myalgias or right upper quadrant pain.   10/24/2017:  He reports that he has been feeling good and everything has seemed to be stable as far as he can tell.  Has no specific concerns to address today. He is using his insulin as directed. Continues to be very careful with his feet and provide excellent foot care. Still cannot afford the co-pay to go to for eye exam. He is currently giving insulin 22 units daily.  States that he checks his blood sugar fasting every morning.  Says that he sometimes gets around 120s or 140s but sometimes higher towards 160s or 180s.  Never gets any low readings less than 100--never low near 80. He is taking blood pressure medications as directed. Having no lightheadedness or other adverse effects. He is taking his pravastatin daily. No myalgias or right upper quadrant pain. The end of the visit as we are going to the lab he tells me that his left elbow has been hurting recently.  States that he has been doing no  repetitive motion that he is aware of.  Symptoms are consistent with tendinitis.  Reviewed that his creatinine has been normal at last lab.  Prescribed Mobic for him to take daily with food for 2 weeks. The end of the visit as we were in the lab he also asked for samples of insulin.  Gave him 2 basaglar insulin pens.   11/06/2017: Office visit 10/24/2017 A1c came back 8.5. Prior to that time, he had been administering 22 units of basal insulin daily. At that time I recommended for him to increase to 24 units of basal insulin daily.  Check fasting morning blood sugars and document every morning and bring into follow-up office visit in 1  week. He presents for that follow-up visit today. He reports that he did increase the insulin up to 24 units daily.   Since increasing to 24 units his fasting morning blood sugars have been: 194, 170, 159, 154, 160, 148, 245, 176, 148, 211. The readings in February when he was still on the 22 units were very similar.  Actually overall blood sugars actually were better back around January and February. Today I have given him a new blood sugar log form and have filled in 24 units-----11/04/17------ 176 24 units------ 11/05/17------ 148 24 units------- 11/06/17---------- 211 25 units--------- 11/07/17---------- 26 units--------- 11/08/17-------- 27 units----------- 11/09/17----------  He is to continue to increase by 1 unit each day until fasting blood sugars are around 110 - 150.  When he gets to blood sugar around 110 - 150 then he will pause and continue at that dose of insulin. Will have him come in for follow-up visit in 1 week.     11/11/2017:  24-------------4/1-------176 24-------------4/2--------148 24-------------4/3-------211 25-------------4/4-----147 26-------------4/5-----178 27-------------4/6-----194 28-------------4/7-----156 29-------------4/8-----159 32-------------4/9------ 32-------------4/10---- 32   At this point, I am going to have him put  the insulin at 32 units daily and continue at 32 units daily. He is to call me if he is getting consistent readings down near 110. 10/24/17 A1c came back 8.5. Prior to 10/24/2017 he was giving 22 units daily. On average, would expect that if we increase insulin by 10 units should bring A1c down about a point so if we get this to 32 units daily would expect A1c to come to about 7.5.  Also, review of his recent blood sugars he definitely will not be getting too low at 32 units daily. Will have him do 32 units daily and can wait to return for routine visit 3 months.  Follow-up sooner if needed.   Addendum Added--received Note-- He had Eye Exam 12/16/2017--- Note given to staff to document in [facility]    01/27/2018: He reports that he has been feeling good and everything has seemed to be stable as far as he can tell.  Has no specific concerns to address today. He is using his insulin as directed. Continues to be very careful with his feet and provide excellent foot care.  He is taking blood pressure medications as directed. Having no lightheadedness or other adverse effects. He is taking his pravastatin daily. No myalgias or right upper quadrant pain. He IS administering the insulin at 32 units/day. He brought in blood sugar log forms for the last several months. Fasting readings he has for the month of June : 139, 155, 166, 180, 171, 150, 139, 131, 122, 129, 165, 147, 118, 109    Past Medical History:  Diagnosis Date  . Aortic stenosis, mild   . BPH (benign prostatic hypertrophy)   . CAD (coronary artery disease)   . 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 Constipation   . Diabetes mellitus   . GERD (gastroesophageal reflux disease)   . History of cataract   . Hyperlipidemia   . Hypertension   . Nephrolithiasis   . Vitamin D deficiency      Home Meds: Outpatient Medications Prior to Visit  Medication Sig Dispense Refill  . aspirin 81 MG EC tablet Take 1 tablet (81 mg total)  by mouth daily. Swallow whole. 90 tablet 1  . Blood Glucose Monitoring Suppl (ONE TOUCH ULTRA SYSTEM KIT) w/Device KIT Check blood sugar twice daily. 1 each 0  . Cholecalciferol (VITAMIN D)  2000 units tablet Take 1 tablet (2,000 Units total) by mouth daily. 90 tablet 1  . fish oil-omega-3 fatty acids 1000 MG capsule Take 1 g by mouth daily.     Marland Kitchen glucose blood test strip Check blood sugar (2) twice daily.Please dispense based on insurance preference ICD 10 E11.9,Z79.4 100 each 12  . Insulin Glargine (BASAGLAR KWIKPEN) 100 UNIT/ML SOPN Inject 0.22 mLs (22 Units total) into the skin daily. Give number of units as directed at office visit, up to 30 units daily. (Patient taking differently: Inject 22 Units into the skin daily. Give number of units as directed at office visit, up to 30 units daily. Giving 32 units) 15 pen 3  . Insulin Pen Needle (BD PEN NEEDLE NANO U/F) 32G X 4 MM MISC Use to check blood sugar daily 90 each 2  . Lancets (ONETOUCH ULTRASOFT) lancets Use to check blood sugar. Dispensed based on insurance preference ICD10 E11.9, Z79.4 100 each 12  . lisinopril (PRINIVIL,ZESTRIL) 2.5 MG tablet Take 1 tablet (2.5 mg total) by mouth daily. 90 tablet 1  . metoprolol succinate (TOPROL-XL) 50 MG 24 hr tablet TAKE 1 TABLET BY MOUTH  DAILY WITH OR IMMEDIATELY  FOLLOWING A MEAL 90 tablet 1  . omeprazole (PRILOSEC) 20 MG capsule Take 1 capsule (20 mg total) by mouth daily. 90 capsule 1  . pravastatin (PRAVACHOL) 40 MG tablet TAKE 1 TABLET DAILY 90 tablet 0   No facility-administered medications prior to visit.      Allergies:  Allergies  Allergen Reactions  . Actos [Pioglitazone] Other (See Comments)    Cramps in legs   . Metformin And Related Diarrhea  . Morphine     REACTION: hives  . Statins     REACTION: leg cramps but tolerates pravastatin    Social History   Socioeconomic History  . Marital status: Married    Spouse name: Not on file  . Number of children: Not on file  .  Years of education: Not on file  . Highest education level: Not on file  Occupational History  . Not on file  Social Needs  . Financial resource strain: Not on file  . Food insecurity:    Worry: Not on file    Inability: Not on file  . Transportation needs:    Medical: Not on file    Non-medical: Not on file  Tobacco Use  . Smoking status: Former Smoker    Packs/day: 1.00    Years: 30.00    Pack years: 30.00    Last attempt to quit: 06/10/1999    Years since quitting: 18.6  . Smokeless tobacco: Former Systems developer    Types: Chew    Quit date: 04/09/2014  . Tobacco comment: Quit >15 years ago  Substance and Sexual Activity  . Alcohol use: No    Alcohol/week: 0.0 oz  . Drug use: No  . Sexual activity: Not Currently  Lifestyle  . Physical activity:    Days per week: Not on file    Minutes per session: Not on file  . Stress: Not on file  Relationships  . Social connections:    Talks on phone: Not on file    Gets together: Not on file    Attends religious service: Not on file    Active member of club or organization: Not on file    Attends meetings of clubs or organizations: Not on file    Relationship status: Not on file  . Intimate partner violence:  Fear of current or ex partner: Not on file    Emotionally abused: Not on file    Physically abused: Not on file    Forced sexual activity: Not on file  Other Topics Concern  . Not on file  Social History Narrative   Married for >44 years    Family History  Problem Relation Age of Onset  . Heart disease Mother   . Congestive Heart Failure Father   . Heart failure Sister 66     Review of Systems:  See HPI for pertinent ROS. All other ROS negative.    Physical Exam: Blood pressure 128/74, pulse 64, temperature 97.8 F (36.6 C), temperature source Oral, resp. rate 14, height 5' 8"  (1.727 m), weight 67.9 kg (149 lb 9.6 oz), SpO2 97 %., Body mass index is 22.75 kg/m. General: WNWD WM Appears in no acute distress.  Neck:  Supple. No thyromegaly. No lymphadenopathy.  Soft bilateral carotid bruits. Lungs: Clear bilaterally to auscultation without wheezes, rales, or rhonchi. Breathing is unlabored. Heart: RRR with S1 S2. No murmurs, rubs, or gallops. Abdomen: Soft, non-tender, non-distended with normoactive bowel sounds. No hepatomegaly. No rebound/guarding. No obvious abdominal masses. Musculoskeletal:  Strength and tone normal for age. Extremities/Skin: Warm and dry.  No edema. Neuro: Alert and oriented X 3. Moves all extremities spontaneously. Gait is normal. CNII-XII grossly in tact. Psych:  Responds to questions appropriately with a normal affect.    Diabetic Foot Exam: Inspection: Normal. No wounds, no callouses, worisome areas. Sensation intact.  2+ DP pulses bilaterally. Trace PT pulses bilaterally.      ASSESSMENT AND PLAN:  77 y.o. year old male with     1. Type 2 diabetes mellitus without complication, with long-term current use of insulin (HCC)  In the past---11/2016-- Jardiance -- for him was going to cost over $100 co-pay and that was too expensive. I then reviewed the fact that he cannot tolerate metformin, Actos, Glucotrol. Can not afford Januvia secondary to cost either. Therefore plan to just have him take insulin with no oral medicine for diabetes. Patient was informed of this. He also was given a number to [facility] where he can call to get assistance for pain for his medicines.  He provides excellent foot care. Reminded him of proper foot care and wearing shoes at all times.   He is overdue for ophthalmology exam. He states that he cannot afford this. He has a high co-pay.   He is on ASA 63m, on ACE inhibitor, and on statin therapy.  Addendum Added--received Note-- He had Eye Exam 12/16/2017--- Note given to staff to document in HM  01/27/2018:    Subclinical hypothyroidism 07/25/2017: Check labs to monitor - TSH - T4, free 10/24/2017: Last labs were stable.  Wait to  recheck 6 months. 01/27/2018:  Osteopenia determined by x-ray Prior X-ray showed osteopenia. Patient has never had bone density scan. Discussed having bone density scan and he is agreeable. - DG Bone Density; Future Addendum Added 11/14/15--- DEXA shows Osteoporosis.  Pt to start Fosamax 784monce weekly. 07/25/2017: Reports that he stopped the Fosamax.  Says that it caused his stomach to hurt and caused him to have vomiting. ----------------- Given his financial distress and his age, hold off on any different treatments for this at this point.  Osteoporosis 01/24/2017: --S/P DEXA 11/2015 01/24/2017: --Started Fosamax 11/14/2015  04/25/2017--continue the Fosamax. 07/25/2017: Reports that he stopped the Fosamax.  Says that it caused his stomach to hurt and caused him to  have vomiting. ----------------- Given his financial distress and his age, hold off on any different treatments for this at this point.   S/P AVR (aortic valve replacement) 01/27/2018:  -Stable. -Managed by Cardiology  Cardiomyopathy, dilated, nonischemic 01/27/2018:   -Stable. Managed by Cardiology  Carotid artery stenosis, bilateral In the past, cardiology was ordering his carotid artery ultrasounds. However, patient had stopped following up with cardiology as he could not afford to pay the co-pay. Because of this financial situation, I  Ordered Echo and carotid artery Dopplers more recently.  And I ordered another set of carotid Dopplers which was performed on 10/01/14. Stable. 60-79% bilateral ICA stenosis. Follow-up one year. --F/U performed 09/2015:  Stable. 60-79% bilateral ICA stenosis. Follow-up one year.  01/24/2017:  He had follow-up carotid artery ultrasound 11/12/16. I did send a message to Dr. Harl Bowie and he did follow-up with me and verified that recommendation a follow-up in one year. Repeat carotid ultrasound one year. Bilateral carotid bruits - see  above 01/27/2018: Had follow-up carotid Doppler on  06/24/17.  Carotid evidence of right ICA of 1-39% stenosis.  Left carotid there was left ICA 1-39% stenosis. F/U 12 months.    Hyperlipidemia 07/25/2017: On pravastatin. Check FLP LFT now. He is fasting.  01/27/2018: Reviewed that [place] 07/25/17 LDL was 107 but continued current dose of pravastatin because he has had a history of myalgias with multiple statins in the past. 01/27/2018: On Pravastatin.  He is fasting.  Recheck lab to monitor.  Essential hypertension 01/27/2018:   Blood pressure is at goal. Continue current medications.Check lab to monitor On ACE inhibitor and beta blocker.   Weight Loss Has had evaluation by myself as well as Dr. Wonda Amis. Also, at [facility] 04/04/2015---Realized that he has no lower teeth and that those dentures have been broken for long time and he cannot afford new ones--he says he has to eat soft foods.  04/25/2017---Weight is stable.  CONSTIPATION  04/25/2017---In past was treating with Linzess, Mirilax Not a current problem.   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 Also he is on aspirin 81 mg daily and this is also contributing to this.    Screening colonoscopy: 06/03/2009. Repeat 10-15 years. Had Colonoscopy --Dr. Rehman--11/26/13--Polyps. Internal and External Hemorrhoids.   Immunizations:  Pneumonia vaccine: Patient reports that he had this at age 73 his prior PCP  .--He would have received  Pneumovax 23.   Prevnar  13 given here 09/02/14.  Tetanus vaccine: Patient recently checked with his insurance and is going to cost him $40 and he deferred.  Zostavax: Patient check with his insurance. Too expensive. The patient defers.  Influenza vaccine: Received this 04/20/14, Has had 04/2017   F/U 3 months, sooner if needed.       Signed, [address] [place], [place], [facility] 01/27/2018 9:06 AM

## 2018-01-28 LAB — LIPID PANEL
Cholesterol: 172 mg/dL (ref <200)
HDL: 38 mg/dL — AB (ref >40)
LDL CHOLESTEROL (CALC): 110 mg/dL — AB
NON-HDL CHOLESTEROL (CALC): 134 mg/dL — AB (ref <130)
TRIGLYCERIDES: 127 mg/dL (ref <150)
Total CHOL/HDL Ratio: 4.5 (calc) (ref <5.0)

## 2018-01-28 LAB — COMPLETE METABOLIC PANEL WITH GFR
AG Ratio: 1.4 (calc) (ref 1.0–2.5)
ALT: 20 U/L (ref 9–46)
AST: 22 U/L (ref 10–35)
Albumin: 4.1 g/dL (ref 3.6–5.1)
Alkaline phosphatase (APISO): 119 U/L — ABNORMAL HIGH (ref 40–115)
BUN: 10 mg/dL (ref 7–25)
CALCIUM: 9.7 mg/dL (ref 8.6–10.3)
CO2: 30 mmol/L (ref 20–32)
Chloride: 101 mmol/L (ref 98–110)
Creat: 0.8 mg/dL (ref 0.70–1.18)
GFR, EST NON AFRICAN AMERICAN: 86 mL/min/{1.73_m2} (ref >=60)
GFR, Est African American: 99 mL/min/{1.73_m2} (ref >=60)
GLUCOSE: 126 mg/dL — AB (ref 65–99)
Globulin: 3 g/dL (calc) (ref 1.9–3.7)
POTASSIUM: 4.1 mmol/L (ref 3.5–5.3)
Sodium: 139 mmol/L (ref 135–146)
Total Bilirubin: 0.9 mg/dL (ref 0.2–1.2)
Total Protein: 7.1 g/dL (ref 6.1–8.1)

## 2018-01-28 LAB — HEMOGLOBIN A1C
Hgb A1c MFr Bld: 8.1 % of total Hgb — ABNORMAL HIGH (ref <5.7)
MEAN PLASMA GLUCOSE: 186 (calc)
eAG (mmol/L): 10.3 (calc)

## 2018-01-28 LAB — TSH: TSH: 6.86 mIU/L — ABNORMAL HIGH (ref 0.40–4.50)

## 2018-01-28 LAB — T4, FREE: Free T4: 1 ng/dL (ref 0.8–1.8)

## 2018-01-29 ENCOUNTER — Telehealth: Payer: Self-pay | Admitting: Physician Assistant

## 2018-01-29 NOTE — Telephone Encounter (Signed)
Wants a new diabetic meter freestyle libre continuous glucose motoring system.

## 2018-01-31 MED ORDER — FREESTYLE LIBRE READER DEVI: 1.0000 | 0 refills | Status: DC

## 2018-01-31 MED ORDER — FREESTYLE LIBRE 14 DAY SENSOR MISC: 1.0000 | 5 refills | Status: DC

## 2018-01-31 NOTE — Telephone Encounter (Signed)
Patient called requesting a new glucose meter. Rx sent to pharmacy

## 2018-03-10 ENCOUNTER — Other Ambulatory Visit: Payer: Self-pay | Admitting: Physician Assistant

## 2018-03-12 DIAGNOSIS — R69 Illness, unspecified: Secondary | ICD-10-CM | POA: Diagnosis not present

## 2018-04-24 ENCOUNTER — Encounter (HOSPITAL_COMMUNITY): Payer: Self-pay | Admitting: Emergency Medicine

## 2018-04-24 ENCOUNTER — Emergency Department (HOSPITAL_COMMUNITY)
Admission: EM | Admit: 2018-04-24 | Discharge: 2018-04-24 | Disposition: A | Discharge status: Home / Self Care | Payer: Medicare HMO | Attending: Emergency Medicine | Admitting: Emergency Medicine

## 2018-04-24 ENCOUNTER — Emergency Department (HOSPITAL_COMMUNITY): Payer: Medicare HMO

## 2018-04-24 ENCOUNTER — Other Ambulatory Visit: Payer: Self-pay

## 2018-04-24 DIAGNOSIS — E119 Type 2 diabetes mellitus without complications: Secondary | ICD-10-CM | POA: Insufficient documentation

## 2018-04-24 DIAGNOSIS — M25552 Pain in left hip: Secondary | ICD-10-CM | POA: Insufficient documentation

## 2018-04-24 DIAGNOSIS — I5022 Chronic systolic (congestive) heart failure: Secondary | ICD-10-CM | POA: Insufficient documentation

## 2018-04-24 DIAGNOSIS — Z794 Long term (current) use of insulin: Secondary | ICD-10-CM | POA: Insufficient documentation

## 2018-04-24 DIAGNOSIS — Z79899 Other long term (current) drug therapy: Secondary | ICD-10-CM | POA: Diagnosis not present

## 2018-04-24 DIAGNOSIS — Z7982 Long term (current) use of aspirin: Secondary | ICD-10-CM | POA: Diagnosis not present

## 2018-04-24 DIAGNOSIS — I2510 Atherosclerotic heart disease of native coronary artery without angina pectoris: Secondary | ICD-10-CM | POA: Diagnosis not present

## 2018-04-24 DIAGNOSIS — I110 Hypertensive heart disease with heart failure: Secondary | ICD-10-CM | POA: Insufficient documentation

## 2018-04-24 DIAGNOSIS — R52 Pain, unspecified: Secondary | ICD-10-CM | POA: Diagnosis not present

## 2018-04-24 DIAGNOSIS — S79911A Unspecified injury of right hip, initial encounter: Secondary | ICD-10-CM | POA: Diagnosis not present

## 2018-04-24 DIAGNOSIS — S79912A Unspecified injury of left hip, initial encounter: Secondary | ICD-10-CM | POA: Diagnosis not present

## 2018-04-24 DIAGNOSIS — Z96642 Presence of left artificial hip joint: Secondary | ICD-10-CM | POA: Diagnosis not present

## 2018-04-24 DIAGNOSIS — Z87891 Personal history of nicotine dependence: Secondary | ICD-10-CM | POA: Insufficient documentation
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
## 2018-05-01 ENCOUNTER — Telehealth: Payer: Self-pay

## 2018-05-01 ENCOUNTER — Ambulatory Visit (INDEPENDENT_AMBULATORY_CARE_PROVIDER_SITE_OTHER): Payer: Medicare HMO | Admitting: Physician Assistant

## 2018-05-01 ENCOUNTER — Encounter: Payer: Self-pay | Admitting: Physician Assistant

## 2018-05-01 VITALS — BP 124/60 | HR 69 | Temp 97.7°F | Resp 14 | Ht 68.0 in | Wt 147.2 lb

## 2018-05-01 DIAGNOSIS — I359 Nonrheumatic aortic valve disorder, unspecified: Secondary | ICD-10-CM | POA: Diagnosis not present

## 2018-05-01 DIAGNOSIS — E038 Other specified hypothyroidism: Secondary | ICD-10-CM

## 2018-05-01 DIAGNOSIS — I420 Dilated cardiomyopathy: Secondary | ICD-10-CM | POA: Diagnosis not present

## 2018-05-01 DIAGNOSIS — Z23 Encounter for immunization: Secondary | ICD-10-CM | POA: Diagnosis not present

## 2018-05-01 DIAGNOSIS — I5022 Chronic systolic (congestive) heart failure: Secondary | ICD-10-CM

## 2018-05-01 DIAGNOSIS — Z794 Long term (current) use of insulin: Secondary | ICD-10-CM | POA: Diagnosis not present

## 2018-05-01 DIAGNOSIS — E039 Hypothyroidism, unspecified: Secondary | ICD-10-CM

## 2018-05-01 DIAGNOSIS — I6529 Occlusion and stenosis of unspecified carotid artery: Secondary | ICD-10-CM | POA: Diagnosis not present

## 2018-05-01 DIAGNOSIS — E785 Hyperlipidemia, unspecified: Secondary | ICD-10-CM | POA: Diagnosis not present

## 2018-05-01 DIAGNOSIS — M818 Other osteoporosis without current pathological fracture: Secondary | ICD-10-CM | POA: Diagnosis not present

## 2018-05-01 DIAGNOSIS — E119 Type 2 diabetes mellitus without complications: Secondary | ICD-10-CM | POA: Diagnosis not present

## 2018-05-01 DIAGNOSIS — R0989 Other specified symptoms and signs involving the circulatory and respiratory systems: Secondary | ICD-10-CM

## 2018-05-01 DIAGNOSIS — I350 Nonrheumatic aortic (valve) stenosis: Secondary | ICD-10-CM

## 2018-05-01 DIAGNOSIS — I10 Essential (primary) hypertension: Secondary | ICD-10-CM

## 2018-05-01 DIAGNOSIS — Z952 Presence of prosthetic heart valve: Secondary | ICD-10-CM

## 2018-05-01 NOTE — Progress Notes (Signed)
Patient ID: ADDEN STROUT MRN: [id number], DOB: 1941-01-15, 77 y.o. Date of Encounter: @DATE @  Chief Complaint:  Chief Complaint  Patient presents with  . 3 month follow up  . Diabetes  . Hyperlipidemia  . Hypothyroidism    HPI: 77 y.o. year old white male  presents today for routine followup office visit.   On 04/13/2014 he had aortic valve replacement secondary to severe aortic stenosis.  Had Normal coronaries by cardiac catheterization. Preoperatively he had low EF.  At his office visit with me 05/26/14, pt stated that they did give him metformin in the hospital, but said that he had not been taking it since he got home because his blood sugars were low.  He stated that even off of Metformin, his fasting morning blood sugars had been around 138 and 99. At that visit I told him to stay off of metformin. I reviewed the fact that he had experienced further weight loss with the hospitalization and surgery--I'm sure this is contributing to his decreased sugar.  AT OV 12/2014: He states that he checks his blood sugar about once a week. Sometimes fasting or sometimes during the day.  Says this morning fasting reading was 130. Says that he usually gets around 100 to 130 for fasting readings.  States that he takes his dog for a walk every morning. They usually go around the block and then back around. Says that he feels pretty good with this exertion.  He is taking medications as directed with no adverse effects. No lightheadedness. No presyncope.  His last office visit with me prior to the hospitlaization was 02/22/2014. The following is copied from that office visit note.:  At his routine OV 09/28/2013--at that visit I noticed that he looked even thinner that day than usual. I then noticed that I had noted at his prior visit with me 06/29/13 that he had also lost weight at the time of that visit. At that visit , I initially assumed that his weight loss was intentional  secondary to  dietary changes to help control his diabetes.  However, at that visit 06/29/13 he reported that he decrease his dietary intake secondary to finances. At that visit he stated   "I just have to eat what I have.   I can't afford anything more."  Weight February 2014   was 167. Weight 06/29/13             was 155. Weight 09/28/13               was 147.  09/28/13---checked labs to evaluate underlying medical causes of weight loss including CBC TSH and CME T. These were all normal.  We also discussed the fact he had a screening colonoscopy 06/03/2009 and he was told to repeat 10-15 years.  He did come in for weight check and his weight was stable at that time. Say when he came in for that we checked our staff gave him some contact information --he could call for some assistance.  At [place] 09/28/13 he says that he is using [employer] Says that they get him through to every other Thursday. He gives very pick this up. Says that he has other phone numbers that he could call for food if he needs to but hasn't needed to so far.  He had colonoscopy by Dr. Jearld Adjutant on 12/12/13. There were polyps which were biopsied. External and internal hemorrhoids. At that time Dr. Wonda Amis planned to order CT of abdomen  and pelvis as well as chest.  He had a CT scan on 12/09/13. No malignancy was seen on either CT scan. No significant finding seen on the CT scans.  Today he says that he continues to  get food once a month from 4 or 5 different places--- [employer] out reach, [employer], [employer], their church.  Weight 12/29/14 remains stable at 146.  At visit 12/29/14 I also reviewed last cardiology note.  Saw NP at Cardiology 11/22/14. Things were stable at that visit and he was to continue current medications. No labs or tests were indicated/ ordered at that time and they plan for follow-up 6 months.  04/04/2015:  He reports that sometimes when he first stands up, his left "hip" "is stiff--takes a minute to  straighten up". Says there is no pain, just stiffness.  Does not want to pursue imaging, further eval right now sec to finances--will monitor. No other complaints or concerns. Has been feeling pretty good. Still checks blood sugar once a week--fasting---gets 120s, 133, "something like that"  10/05/2015: He has no complaints during the visit. Reports he had his follow-up carotid artery ultrasound on Monday but has not heard results yet. He is taking cholesterol medication as directed. No myalgias or other adverse effects. He is taking blood pressure medications as directed with no adverse effects. When he gets up off of the exam table to go to the lab, he moves very slowly and stiff holding to his left hip. Asked what area was bothering him he points to the anterior aspect of the left hip.  10/24/2015: At [place] 10/05/2015--A1C came back high at 8.9. 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  When I got that lab result, told him to start Metformin 1,058m BID and check fasting BS QAM and come for f/u OV 2 weeks.  Today he states that he is taking the metformin 1000 mg twice a day as directed. Says it is causing him quite a bit of gas and some heartburn. Says that it's only causing a little bit of diarrhea. Says he takes a Tums and that helps the heartburn. Then adds that these symptoms were worse when he first started the Metformin and that the symptoms are getting better.  He does bring in blood sugar log sheet. Fasting morning readings have been 167, 129, 137, 125, 115, 138, 108, 136, 111, 109, 143, 145, 114, 130.  12/22/2015--- Phone message on this date that patient called reporting having diarrhea with the metformin 1000 twice a day. At time he was told to decrease down to 500 mg twice a day.  04/16/2016: Pt reports that he has decreased the metformin down to 500 mg once a day. Says he still has some diarrhea even with this low dose. He checks his blood sugar about twice a week. Says yesterday he  got 158 and that was one or 2 hours after he ate. No other complaints or concerns today. That he is feeling good. Taking blood pressure medication as directed with no lightheadedness or other adverse effects. Taking pravastatin with no myalgias or other adverse effects.   09/27/2016: Today he came in for office visit because "high blood sugar readings". This was not a routine f/u OV. (I dont know why he is overdue for ROV / why he hasnot come in for ROV prior to today) States that he has been getting readings like 350 and 320 and says that these are fasting morning readings. Says he has been getting these kinds  of readings for about 2 months now.  I reviewed his Lab from 04/2016. At that time A1c was performed. At that time I had said to add Glucotrol.  Today I asked if he was taking that as directed. He says that caused diarrhea so he is not taking it. I reviewed that  the metformin caused diarrhea in the past but asked if he was confusing this with metformin and he says no- the Glucotrol also cause diarrhea so he quit it. Now he still has some diarrhea but that it was worse with the Glucotrol. ASSESSMENT/PLAN AT THAT OV: 1. Type 2 diabetes mellitus without complication, without long-term current use of insulin (HCC) At this time Blood Sugar reading 236 and A1c 11.0 He cannot take Metformin, Actos, Glucotrol. Is not going to be able to afford multiple medications. At this time will add basal insulin. Staff has shown him how to do this and he has administered first dose here today. He is given 10 units. To then titrate up 1 unit each day until he gets to 16 units. He will then stop/ pause at 16 units. Given him a blood sugar log sheet and he is to document fasting morning readings. Is scheduling follow-up visit with me for this upcoming Monday. Bring the log sheet with him to that appointment. - Hemoglobin A1C, fingerstick - Glucose, fingerstick (stat) - Insulin Glargine (LANTUS SOLOSTAR) 100  UNIT/ML Solostar Pen; Give # units as directed at office visit, up to 30 units daily.  Dispense: 5 pen; Refill: PRN   10/01/2016: Today he does bring in his blood sugar log sheet that we gave him at last office visit. He does have documented that he has been increasing number of units of insulin by 1 each day since that visit. Also has documented that his fasting blood sugars have been (in this chronological order according to the date): 285, 200, 267, 198. Today also reviewed the fact that he is unable to take Actos because of his cardiac issues. Can not take metformin or glipizide because each of these causes diarrhea. Verified with him again today that his diarrhea does get better when he stops the glipizide. Says that his Insulin is going to cost > $200  Assessment/Plan at that OV: 1. Type 2 diabetes mellitus without complication, without long-term current use of insulin ([facility]) Today I had Maudie Mercury talk to him regarding finances and assistance that may be available to help with this as well as finding out which insulin will have the lowest co-pay with his insurance coverage. Today I have told him to continue increasing the insulin by 1 unit per day until he gets fasting sugars reading around 150. When sugar reading around 150 then he will pause and continue that number of units. At this time will add Januvia 100 mg once daily. Renal function was normal on last lab. He will continue to document fasting sugars every morning. He will have follow-up visit with me in one week.   10/08/2016: He states that he is taking the Januvia 100 mg daily. He got it at the local pharmacy and is waiting for the mail order to come in. He will pay $125 for 90 day supply. Currently using the sample of the Basaglar that we gave him at last visit. Says that he has sent in for the mail order and that his wife said it supposed to be free. Has no questions or concerns today. Having no adverse effects with the medicines.  Does  bring  in his blood sugar log form. Has been increasing by 1 unit each day. The last 6 fasting blood sugar readings have been 240, 182, 158, 176, 276, 173. He is currently at 17 units daily. A/P at that OV: Today I gave him 4 weeks worth of samples of the Januvia 100 mg daily. Today I gave him samples of 2 more of the [employer] I have written # units daily on his BS log sheet---He is to continue increasing by 1 unit daily until he gets to 20 units and then we will continue 20 units daily. Will continue on the 20 units daily until he has follow-up visit in 1-2 weeks. Bring the blood sugar log form with him to that visit. F/U sooner if needed if any questions or concerns.   Addendum added 10/10/2016:  Patient's wife her for OV with me. Reports that Januvia is causing husband to have diarrhea. I checked for samples of the 19m dose so he could try this dose but we have no Januvia 552msamples. Therefore told her to have him just stop the Januvia until his follow-up visit which she says is scheduled in just a couple weeks.  10/24/2016: He reports that he did stop the Januvia. The diarrhea did resolve.  He did increase Insulin by one unit until he got to 20 units and has continued 20 units daily since.  He does bring in BS log-- Fasting morning readings since getting to 20 units are : 189, 178, 171, 161, 143, 142, 162, 140, 178, 140, 148. A/P at this OV: Will have him increase to 22 units daily and stay at that dose.  I checked for samples of Januvia 5032mgain today but we have none.  At this time will D/C Januvia and try Jardiance 54m27m 2 boxes of samples given (total 14 tablets)    01/24/2017: Today he reports he has been feeling very well. Is that his granddaughter is living with them for the summer and that she is looking into joining the Y and he is asking about whether be okay for him to swim if they joined the Y. I told him that that would be excellent exercise for him and highly encouraged  him to do this. Told him to just be careful not to hit his toes or feet but otherwise encouraged him to do that and for him to see if there are any classes offered especially senior classes for water aerobics.  He is currently taking his insulin at 22 units daily. He states that financially it is difficult to have the money for every prescriptions that he has been coming here and getting some samples. As well today I have given him 4 sample pens of Basaglar.  He brings in his blood sugar log. He only checks fasting morning readings but he does check this every day and knees are excellent. For the month of June since June 1 he has been getting the following readings: 114, 112, 153, 120, 108, 105, 166, 113, 125, 135, 120, 139, 198, 137, 118, 139, 149.  I did review telephone notes that are documented from 11/05/2016. This documents that Jardiance -- for him was going to cost over $100 co-pay and that was too expensive. I then reviewed the fact that he cannot tolerate metformin, Actos, Glucotrol. Can not afford Januvia secondary to cost either. Therefore plan to just have him take insulin with no oral medicine for diabetes. Patient was informed of this. He also was given a  number to [facility] where he can call to get assistance for pain for his medicines.  He is taking blood pressure medications as directed. Having no lightheadedness or other adverse effects.  He is taking his pravastatin daily. No myalgias or right upper quadrant pain.  He has no concerns to address today.   04/25/2017: Today he reports that he has continued to feel good. States that he does occasionally get some minor discomfort going down the lateral aspect of his right leg. Discussed with him that this is consistent with right sciatica. Says that it really doesn't hurt bad just something a little annoying every now and then. Comes and goes intermittently. Discussed some stretches that he could do if this does flare and  follow-up if it worsens. Otherwise, has been feeling good. He is using his insulin as directed. Did not bring in blood sugar log form for me to review today. Continues to be very careful with his feet and provide excellent foot care. Still cannot afford the co-pay to go to for eye exam. He is taking blood pressure medications as directed. Having no lightheadedness or other adverse effects. He is taking his pravastatin daily. No myalgias or right upper quadrant pain.  07/25/2017: No specific concerns today.  Says that he has been feeling fine.  That he had been writing down his blood sugars on the form but then left the form at home and forgot to bring it with him today. He is using his insulin as directed. Continues to be very careful with his feet and provide excellent foot care. Still cannot afford the co-pay to go to for eye exam. He is taking blood pressure medications as directed. Having no lightheadedness or other adverse effects. He is taking his pravastatin daily. No myalgias or right upper quadrant pain.   10/24/2017:  He reports that he has been feeling good and everything has seemed to be stable as far as he can tell.  Has no specific concerns to address today. He is using his insulin as directed. Continues to be very careful with his feet and provide excellent foot care. Still cannot afford the co-pay to go to for eye exam. He is currently giving insulin 22 units daily.  States that he checks his blood sugar fasting every morning.  Says that he sometimes gets around 120s or 140s but sometimes higher towards 160s or 180s.  Never gets any low readings less than 100--never low near 80. He is taking blood pressure medications as directed. Having no lightheadedness or other adverse effects. He is taking his pravastatin daily. No myalgias or right upper quadrant pain. The end of the visit as we are going to the lab he tells me that his left elbow has been hurting recently.  States that he has  been doing no repetitive motion that he is aware of.  Symptoms are consistent with tendinitis.  Reviewed that his creatinine has been normal at last lab.  Prescribed Mobic for him to take daily with food for 2 weeks. The end of the visit as we were in the lab he also asked for samples of insulin.  Gave him 2 basaglar insulin pens.   11/06/2017: Office visit 10/24/2017 A1c came back 8.5. Prior to that time, he had been administering 22 units of basal insulin daily. At that time I recommended for him to increase to 24 units of basal insulin daily.  Check fasting morning blood sugars and document every morning and bring into follow-up office visit  in 1 week. He presents for that follow-up visit today. He reports that he did increase the insulin up to 24 units daily.   Since increasing to 24 units his fasting morning blood sugars have been: 194, 170, 159, 154, 160, 148, 245, 176, 148, 211. The readings in February when he was still on the 22 units were very similar.  Actually overall blood sugars actually were better back around January and February. Today I have given him a new blood sugar log form and have filled in 24 units-----11/04/17------ 176 24 units------ 11/05/17------ 148 24 units------- 11/06/17---------- 211 25 units--------- 11/07/17---------- 26 units--------- 11/08/17-------- 27 units----------- 11/09/17----------  He is to continue to increase by 1 unit each day until fasting blood sugars are around 110 - 150.  When he gets to blood sugar around 110 - 150 then he will pause and continue at that dose of insulin. Will have him come in for follow-up visit in 1 week.     11/11/2017:  24-------------4/1-------176 24-------------4/2--------148 24-------------4/3-------211 25-------------4/4-----147 26-------------4/5-----178 27-------------4/6-----194 28-------------4/7-----156 29-------------4/8-----159 32-------------4/9------ 32-------------4/10---- 32   At this point, I am going  to have him put the insulin at 32 units daily and continue at 32 units daily. He is to call me if he is getting consistent readings down near 110. 10/24/17 A1c came back 8.5. Prior to 10/24/2017 he was giving 22 units daily. On average, would expect that if we increase insulin by 10 units should bring A1c down about a point so if we get this to 32 units daily would expect A1c to come to about 7.5.  Also, review of his recent blood sugars he definitely will not be getting too low at 32 units daily. Will have him do 32 units daily and can wait to return for routine visit 3 months.  Follow-up sooner if needed.   Addendum Added--received Note-- He had Eye Exam 12/16/2017--- Note given to staff to document in [facility]    01/27/2018: He reports that he has been feeling good and everything has seemed to be stable as far as he can tell.  Has no specific concerns to address today. He is using his insulin as directed. Continues to be very careful with his feet and provide excellent foot care.  He is taking blood pressure medications as directed. Having no lightheadedness or other adverse effects. He is taking his pravastatin daily. No myalgias or right upper quadrant pain. He IS administering the insulin at 32 units/day. He brought in blood sugar log forms for the last several months. Fasting readings he has for the month of June : 139, 155, 166, 180, 171, 150, 139, 131, 122, 129, 165, 147, 118, 109   05/01/2018: He reports that he has been feeling good and everything has seemed to be stable as far as he can tell.  Has no specific concerns to address today. He is using his insulin as directed. Continues to be very careful with his feet and provide excellent foot care.  He is taking blood pressure medications as directed. Having no lightheadedness or other adverse effects. He is taking his pravastatin daily. No myalgias or right upper quadrant pain. He IS administering the insulin at 32 units/day. He did not  bring in blood sugar log with him today.  However he states that fasting morning readings are in the 140s, 160s, 170s, 180.  Has gotten no low readings.  Says that 108 is the lowest reading he has had. Reviewed that at last visit we did full panel of labs  with A1c, FLP, CME T, TSH, free T4.  All of these were good and he was to continue all meds the same with no change. Offered flu shot today and he does want to go ahead and get this.     Past Medical History:  Diagnosis Date  . Aortic stenosis, mild   . BPH (benign prostatic hypertrophy)   . CAD (coronary artery disease)   . 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 Constipation   . Diabetes mellitus   . GERD (gastroesophageal reflux disease)   . History of cataract   . Hyperlipidemia   . Hypertension   . Nephrolithiasis   . Vitamin D deficiency      Home Meds: Outpatient Medications Prior to Visit  Medication Sig Dispense Refill  . aspirin 81 MG EC tablet Take 1 tablet (81 mg total) by mouth daily. Swallow whole. 90 tablet 1  . Blood Glucose Monitoring Suppl (ONE TOUCH ULTRA SYSTEM KIT) w/Device KIT Check blood sugar twice daily. 1 each 0  . Cholecalciferol (VITAMIN D) 2000 units tablet Take 1 tablet (2,000 Units total) by mouth daily. 90 tablet 1  . Continuous Blood Gluc Receiver (FREESTYLE LIBRE READER) DEVI 1 Device by Does not apply route every 14 (fourteen) days. ICD:e11.9 1 Device 0  . Continuous Blood Gluc Sensor (FREESTYLE LIBRE 14 DAY SENSOR) MISC 1 Package by Does not apply route every 14 (fourteen) days. ICD10:E11.9 6 each 5  . fish oil-omega-3 fatty acids 1000 MG capsule Take 1 g by mouth daily.     Marland Kitchen glucose blood test strip Check blood sugar (2) twice daily.Please dispense based on insurance preference ICD 10 E11.9,Z79.4 100 each 12  . Insulin Glargine (BASAGLAR KWIKPEN) 100 UNIT/ML SOPN Inject 0.22 mLs (22 Units total) into the skin daily. Give number of units as directed at office visit, up to 30 units  daily. (Patient taking differently: Inject 22 Units into the skin daily. Give number of units as directed at office visit, up to 30 units daily. Giving 32 units) 15 pen 3  . Insulin Pen Needle (BD PEN NEEDLE NANO U/F) 32G X 4 MM MISC Use to check blood sugar daily 90 each 2  . Lancets (ONETOUCH ULTRASOFT) lancets Use to check blood sugar. Dispensed based on insurance preference ICD10 E11.9, Z79.4 100 each 12  . lisinopril (PRINIVIL,ZESTRIL) 2.5 MG tablet TAKE 1 TABLET DAILY 90 tablet 1  . metoprolol succinate (TOPROL-XL) 50 MG 24 hr tablet TAKE 1 TABLET DAILY WITH ORIMMEDIATELY FOLLOWING A    MEAL 90 tablet 1  . omeprazole (PRILOSEC) 20 MG capsule TAKE 1 CAPSULE DAILY 90 capsule 1  . pravastatin (PRAVACHOL) 40 MG tablet TAKE 1 TABLET DAILY 90 tablet 0   No facility-administered medications prior to visit.      Allergies:  Allergies  Allergen Reactions  . Actos [Pioglitazone] Other (See Comments)    Cramps in legs   . Metformin And Related Diarrhea  . Morphine     REACTION: hives  . Statins     REACTION: leg cramps but tolerates pravastatin    Social History   Socioeconomic History  . Marital status: Married    Spouse name: Not on file  . Number of children: Not on file  . Years of education: Not on file  . Highest education level: Not on file  Occupational History  . Not on file  Social Needs  . Financial resource strain: Not on file  . Food  insecurity:    Worry: Not on file    Inability: Not on file  . Transportation needs:    Medical: Not on file    Non-medical: Not on file  Tobacco Use  . Smoking status: Former Smoker    Packs/day: 1.00    Years: 30.00    Pack years: 30.00    Last attempt to quit: 06/10/1999    Years since quitting: 18.9  . Smokeless tobacco: Former Systems developer    Types: Chew    Quit date: 04/09/2014  . Tobacco comment: Quit >15 years ago  Substance and Sexual Activity  . Alcohol use: No    Alcohol/week: 0.0 standard drinks  . Drug use: No  . Sexual  activity: Not Currently  Lifestyle  . Physical activity:    Days per week: Not on file    Minutes per session: Not on file  . Stress: Not on file  Relationships  . Social connections:    Talks on phone: Not on file    Gets together: Not on file    Attends religious service: Not on file    Active member of club or organization: Not on file    Attends meetings of clubs or organizations: Not on file    Relationship status: Not on file  . Intimate partner violence:    Fear of current or ex partner: Not on file    Emotionally abused: Not on file    Physically abused: Not on file    Forced sexual activity: Not on file  Other Topics Concern  . Not on file  Social History Narrative   Married for >44 years    Family History  Problem Relation Age of Onset  . Heart disease Mother   . Congestive Heart Failure Father   . Heart failure Sister 93     Review of Systems:  See HPI for pertinent ROS. All other ROS negative.    Physical Exam: Blood pressure 124/60, pulse 69, temperature 97.7 F (36.5 C), temperature source Oral, resp. rate 14, height '5\' 8"'$  (1.727 m), weight 66.8 kg, SpO2 98 %., Body mass index is 22.38 kg/m. General:  Thin WM .Appears in no acute distress. Neck: Supple. No thyromegaly. No lymphadenopathy.  Soft bilateral carotid bruits. Lungs: Clear bilaterally to auscultation without wheezes, rales, or rhonchi. Breathing is unlabored. Heart: RRR with S1 S2. No murmurs, rubs, or gallops. Abdomen: Soft, non-tender, non-distended with normoactive bowel sounds. No hepatomegaly. No rebound/guarding. No obvious abdominal masses. Musculoskeletal:  Strength and tone normal for age. Extremities/Skin: Warm and dry.  No LE edema.   Neuro: Alert and oriented X 3. Moves all extremities spontaneously. Gait is normal. CNII-XII grossly in tact. Psych:  Responds to questions appropriately with a normal affect.    Diabetic Foot Exam: Inspection: Normal. No wounds, no callouses, worisome  areas. Sensation intact.  2+ DP pulses bilaterally. Trace PT pulses bilaterally.      ASSESSMENT AND PLAN:  77 y.o. year old male with     1. Type 2 diabetes mellitus without complication, with long-term current use of insulin (HCC)  In the past---11/2016-- Jardiance -- for him was going to cost over $100 co-pay and that was too expensive. I then reviewed the fact that he cannot tolerate metformin, Actos, Glucotrol. Can not afford Januvia secondary to cost either. Therefore plan to just have him take insulin with no oral medicine for diabetes. Patient was informed of this. He also was given a number to [facility] where he  can call to get assistance for pain for his medicines.  He provides excellent foot care. Reminded him of proper foot care and wearing shoes at all times.   He is overdue for ophthalmology exam. He states that he cannot afford this. He has a high co-pay.   He is on ASA 52m, on ACE inhibitor, and on statin therapy.  Addendum Added--received Note-- He had Eye Exam 12/16/2017--- [facility]:  Recheck A1c and microalbumin.    Subclinical hypothyroidism 07/25/2017: Check labs to monitor - TSH - T4, free 10/24/2017: Last labs were stable.  Wait to recheck 6 months. 05/01/2018: Labs were stable 01/2018.  Wait 6 months to recheck.   Osteopenia determined by x-ray Prior X-ray showed osteopenia. Patient has never had bone density scan. Discussed having bone density scan and he is agreeable. - DG Bone Density; Future Addendum Added 11/14/15--- DEXA shows Osteoporosis.  Pt to start Fosamax 730monce weekly. 07/25/2017: Reports that he stopped the Fosamax.  Says that it caused his stomach to hurt and caused him to have vomiting. ----------------- Given his financial distress and his age, hold off on any different treatments for this at this point.  Osteoporosis 01/24/2017: --S/P DEXA 11/2015 01/24/2017: --Started Fosamax  11/14/2015  04/25/2017--continue the Fosamax. 07/25/2017: Reports that he stopped the Fosamax.  Says that it caused his stomach to hurt and caused him to have vomiting. ----------------- Given his financial distress and his age, hold off on any different treatments for this at this point.   S/P AVR (aortic valve replacement) 01/27/2018:  -Stable. -Managed by Cardiology 05/01/2018:-Stable. -Managed by Cardiology    Cardiomyopathy, dilated, nonischemic 01/27/2018:   -Stable. Managed by Cardiology 05/01/2018: -Stable. -Managed by Cardiology     Carotid artery stenosis, bilateral In the past, cardiology was ordering his carotid artery ultrasounds. However, patient had stopped following up with cardiology as he could not afford to pay the co-pay. Because of this financial situation, I  Ordered Echo and carotid artery Dopplers more recently.  And I ordered another set of carotid Dopplers which was performed on 10/01/14. Stable. 60-79% bilateral ICA stenosis. Follow-up one year. --F/U performed 09/2015:  Stable. 60-79% bilateral ICA stenosis. Follow-up one year.  01/24/2017:  He had follow-up carotid artery ultrasound 11/12/16. I did send a message to Dr. BrHarl Bowiend he did follow-up with me and verified that recommendation a follow-up in one year. Repeat carotid ultrasound one year. Bilateral carotid bruits - see  above 01/27/2018: Had follow-up carotid Doppler on 06/24/17.  Carotid evidence of right ICA of 1-39% stenosis.  Left carotid there was left ICA 1-39% stenosis. F/U 12 months.    Hyperlipidemia 07/25/2017: On pravastatin. Check FLP LFT now. He is fasting.  01/27/2018: [place] 107 but continued current dose of pravastatin because he has had a history of myalgias with multiple statins in the past. 01/27/2018: On Pravastatin.  He is fasting.  Recheck lab to monitor. 05/01/2018: On pravastatin.  Lipid panel was at goal at labs 6/19.  LFTs were  normal.   Essential hypertension 01/27/2018:   Blood pressure is at goal. Continue current medications.Check lab to monitor 05/01/2018:  Blood pressure is at goal. Continue current medications.  On ACE inhibitor and beta blocker.   Weight Loss Has had evaluation by myself as well as [place] [place] [place] [facility]---Realized that he has no lower teeth and that those dentures have been broken for long time and he  cannot afford new ones--he says he has to eat soft foods.  04/25/2017---Weight is stable.  CONSTIPATION  04/25/2017---In past was treating with Linzess, Mirilax Not a current problem.   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 Also he is on aspirin 81 mg daily and this is also contributing to this.    Screening colonoscopy: 06/03/2009. Repeat 10-15 years. Had Colonoscopy --Dr. Rehman--11/26/13--Polyps. Internal and External Hemorrhoids.   Immunizations:  Pneumonia vaccine: Patient reports that he had this at age 47 his prior PCP  .--He would have received  Pneumovax 23.   Prevnar  13 given here 09/02/14.  Tetanus vaccine: Patient recently checked with his insurance and is going to cost him $40 and he deferred.  Zostavax: Patient check with his insurance. Too expensive. The patient defers.  Influenza vaccine: Received this 04/20/14, Has had 04/2017, 05/01/2018   F/U 3 months, sooner if needed.       Signed, [address] [place], [place], B[facility] 05/01/2018 8:25 AM

## 2018-05-01 NOTE — Telephone Encounter (Signed)
Patient is needs medication assistance with his basaglar. I faxed over signed application for approval on 04/08/2018. [employer] then faxed back a response that they needed documentation showing proof of income. While patient was in office for a visit today  I gave him the forms along with the address and the items needed for him to fax to [facility] for determination.

## 2018-05-02 LAB — HEMOGLOBIN A1C
Hgb A1c MFr Bld: 9.1 %{Hb} — ABNORMAL HIGH
Mean Plasma Glucose: 214 (calc)
eAG (mmol/L): 11.9 (calc)

## 2018-05-02 LAB — MICROALBUMIN, URINE: Microalb, Ur: 3.5 mg/dL

## 2018-06-16 ENCOUNTER — Other Ambulatory Visit: Payer: Self-pay | Admitting: Physician Assistant

## 2018-06-22 ENCOUNTER — Emergency Department (HOSPITAL_COMMUNITY)
Admission: EM | Admit: 2018-06-22 | Discharge: 2018-06-22 | Disposition: A | Discharge status: Home / Self Care | Payer: Medicare HMO | Attending: Emergency Medicine | Admitting: Emergency Medicine

## 2018-06-22 ENCOUNTER — Encounter (HOSPITAL_COMMUNITY): Payer: Self-pay

## 2018-06-22 ENCOUNTER — Other Ambulatory Visit: Payer: Self-pay

## 2018-06-22 ENCOUNTER — Emergency Department (HOSPITAL_COMMUNITY): Payer: Medicare HMO

## 2018-06-22 DIAGNOSIS — M25552 Pain in left hip: Secondary | ICD-10-CM | POA: Diagnosis not present

## 2018-06-22 DIAGNOSIS — W010XXA Fall on same level from slipping, tripping and stumbling without subsequent striking against object, initial encounter: Secondary | ICD-10-CM | POA: Diagnosis not present

## 2018-06-22 DIAGNOSIS — E119 Type 2 diabetes mellitus without complications: Secondary | ICD-10-CM | POA: Diagnosis not present

## 2018-06-22 DIAGNOSIS — Z79899 Other long term (current) drug therapy: Secondary | ICD-10-CM | POA: Diagnosis not present

## 2018-06-22 DIAGNOSIS — Z87891 Personal history of nicotine dependence: Secondary | ICD-10-CM | POA: Insufficient documentation

## 2018-06-22 DIAGNOSIS — I110 Hypertensive heart disease with heart failure: Secondary | ICD-10-CM | POA: Diagnosis not present

## 2018-06-22 DIAGNOSIS — R42 Dizziness and giddiness: Secondary | ICD-10-CM | POA: Diagnosis not present

## 2018-06-22 DIAGNOSIS — S79912A Unspecified injury of left hip, initial encounter: Secondary | ICD-10-CM | POA: Diagnosis not present

## 2018-06-22 DIAGNOSIS — Y939 Activity, unspecified: Secondary | ICD-10-CM | POA: Diagnosis not present

## 2018-06-22 DIAGNOSIS — Z7982 Long term (current) use of aspirin: Secondary | ICD-10-CM | POA: Insufficient documentation

## 2018-06-22 DIAGNOSIS — W19XXXA Unspecified fall, initial encounter: Secondary | ICD-10-CM

## 2018-06-22 DIAGNOSIS — I2510 Atherosclerotic heart disease of native coronary artery without angina pectoris: Secondary | ICD-10-CM | POA: Insufficient documentation

## 2018-06-22 DIAGNOSIS — I420 Dilated cardiomyopathy: Secondary | ICD-10-CM | POA: Diagnosis not present

## 2018-06-22 DIAGNOSIS — Y929 Unspecified place or not applicable: Secondary | ICD-10-CM | POA: Diagnosis not present

## 2018-06-22 DIAGNOSIS — I5022 Chronic systolic (congestive) heart failure: Secondary | ICD-10-CM | POA: Diagnosis not present

## 2018-06-22 DIAGNOSIS — Y998 Other external cause status: Secondary | ICD-10-CM | POA: Insufficient documentation

## 2018-06-22 DIAGNOSIS — E785 Hyperlipidemia, unspecified: Secondary | ICD-10-CM | POA: Diagnosis not present

## 2018-06-22 LAB — CBC WITH DIFFERENTIAL/PLATELET
Abs Immature Granulocytes: 0.02 10*3/uL (ref 0.00–0.07)
Basophils Absolute: 0.1 10*3/uL (ref 0.0–0.1)
Basophils Relative: 2 %
Eosinophils Absolute: 0.3 10*3/uL (ref 0.0–0.5)
Eosinophils Relative: 4 %
HCT: 46.9 % (ref 39.0–52.0)
Hemoglobin: 14.5 g/dL (ref 13.0–17.0)
Immature Granulocytes: 0 %
Lymphocytes Relative: 24 %
Lymphs Abs: 1.5 10*3/uL (ref 0.7–4.0)
MCH: 27.6 pg (ref 26.0–34.0)
MCHC: 30.9 g/dL (ref 30.0–36.0)
MCV: 89.3 fL (ref 80.0–100.0)
Monocytes Absolute: 0.5 10*3/uL (ref 0.1–1.0)
Monocytes Relative: 9 %
Neutro Abs: 3.7 10*3/uL (ref 1.7–7.7)
Neutrophils Relative %: 61 %
Platelets: 158 10*3/uL (ref 150–400)
RBC: 5.25 MIL/uL (ref 4.22–5.81)
RDW: 14.3 % (ref 11.5–15.5)
WBC: 6 10*3/uL (ref 4.0–10.5)
nRBC: 0 % (ref 0.0–0.2)

## 2018-06-22 LAB — BASIC METABOLIC PANEL
Anion gap: 8 (ref 5–15)
BUN: 14 mg/dL (ref 8–23)
CO2: 26 mmol/L (ref 22–32)
Calcium: 9.6 mg/dL (ref 8.9–10.3)
Chloride: 101 mmol/L (ref 98–111)
Creatinine, Ser: 0.73 mg/dL (ref 0.61–1.24)
GFR calc Af Amer: >60 mL/min (ref >60)
GFR calc non Af Amer: >60 mL/min (ref >60)
Glucose, Bld: 183 mg/dL — ABNORMAL HIGH (ref 70–99)
Potassium: 4.2 mmol/L (ref 3.5–5.1)
Sodium: 135 mmol/L (ref 135–145)

## 2018-06-22 LAB — URINALYSIS, ROUTINE W REFLEX MICROSCOPIC
Bilirubin Urine: NEGATIVE
Glucose, UA: 150 mg/dL — AB
Hgb urine dipstick: NEGATIVE
Ketones, ur: NEGATIVE mg/dL
Leukocytes, UA: NEGATIVE
Nitrite: NEGATIVE
Protein, ur: NEGATIVE mg/dL
Specific Gravity, Urine: 1.018 (ref 1.005–1.030)
pH: 5 (ref 5.0–8.0)

## 2018-06-22 LAB — CBG MONITORING, ED: GLUCOSE-CAPILLARY: 163 mg/dL — AB (ref 70–99)

## 2018-06-22 MED ORDER — PROMETHAZINE HCL 12.5 MG PO TABS
12.5000 mg | ORAL_TABLET | Freq: Once | ORAL | Status: AC
  Administered 2018-06-22: 12.5 mg via ORAL
  Filled 2018-06-22: qty 1

## 2018-06-22 MED ORDER — MECLIZINE HCL 12.5 MG PO TABS: 12.5000 mg | ORAL_TABLET | Freq: Three times a day (TID) | ORAL | 0 refills | Status: AC | PRN

## 2018-06-22 MED ORDER — MECLIZINE HCL 12.5 MG PO TABS
25.0000 mg | ORAL_TABLET | Freq: Once | ORAL | Status: AC
  Administered 2018-06-22: 25 mg via ORAL
  Filled 2018-06-22: qty 2

## 2018-06-22 NOTE — ED Notes (Signed)
Pt states he is dizzy while lying in bed.

## 2018-06-22 NOTE — ED Notes (Signed)
MD at bedside. 

## 2018-06-22 NOTE — ED Triage Notes (Signed)
Pt was standing up using the restroom this morning when he got dizzy and fell back into the bathtub. Did not hit his head. Has an artificial left hip. Is ambulatory. NAD. Alert and oriented.

## 2018-06-23 ENCOUNTER — Telehealth: Payer: Self-pay | Admitting: Orthopedic Surgery

## 2018-07-01 DIAGNOSIS — E119 Type 2 diabetes mellitus without complications: Secondary | ICD-10-CM | POA: Diagnosis not present
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
## 2018-07-14 ENCOUNTER — Ambulatory Visit: Payer: Medicare HMO | Admitting: Orthopedic Surgery

## 2018-07-14 ENCOUNTER — Encounter: Payer: Self-pay | Admitting: Orthopedic Surgery

## 2018-07-14 VITALS — BP 134/74 | HR 78 | Ht 68.0 in | Wt 149.0 lb

## 2018-07-14 DIAGNOSIS — M25552 Pain in left hip: Secondary | ICD-10-CM

## 2018-07-14 DIAGNOSIS — M1612 Unilateral primary osteoarthritis, left hip: Secondary | ICD-10-CM

## 2018-07-14 NOTE — Patient Instructions (Addendum)
The original implant will need to be removed and a total hip will need to be implanted    you will get a call from Dr Shaune Spittle office for appointment.     Total Hip Replacement Total hip replacement is a surgical procedure to remove damaged bone in your hip joint and replace it with an artificial hip joint (prosthetic hip joint). The purpose of this surgery is to reduce pain and to improve your hip function. During a total hip replacement, one or both parts of the hip joint are replaced, depending on the type of joint damage you have. The hip is a ball-and-socket type of joint, and it has two main parts. The ball part of the joint (femoral head) is the top of the thigh bone (femur). The socket part of the joint is a large indent in the side of your pelvis (acetabulum) where the femur and pelvis meet. Tell a health care provider about:  Any allergies you have.  All medicines you are taking, including vitamins, herbs, eye drops, creams, and over-the-counter medicines.  Any problems you or family members have had with anesthetic medicines.  Any blood disorders you have.  Any surgeries you have had.  Any medical conditions you have. What are the risks? Generally, total hip replacement is a safe procedure. However, problems can occur, including:  Infection.  Dislocation (the ball of the hip-joint prosthesis comes out of contact with the socket).  Loosening of the piece (stem) that connects the prosthetic femoral head to the femur.  Fracture of the bone while inserting the prosthesis.  Formation of blood clots, which can break loose and travel to and injure your lungs (pulmonary embolus).  What happens before the procedure?  Plan to have someone take you home after the procedure.  Do not eat or drink anything after midnight on the night before the procedure or as directed by your health care provider.  Ask your health care provider about: ? Changing or stopping your regular  medicines. This is especially important if you are taking diabetes medicines or blood thinners. ? Taking medicines such as aspirin and ibuprofen. These medicines can thin your blood. Do not take these medicines before your procedure if your health care provider asks you not to.  Ask your health care provider about how your surgical site will be marked or identified.  You may be given antibiotic medicines to help prevent infection. What happens during the procedure?  To reduce your risk of infection: ? Your health care team will wash or sanitize their hands. ? Your skin will be washed with soap.  An IV tube will be inserted into one of your veins. You will be given one or more of the following: ? A medicine that makes you drowsy (sedative). ? A medicine that makes you fall asleep (general anesthetic). ? A medicine injected into your spine that numbs your body below the waist (spinal anesthetic).  An incision will be made in your hip. Your surgeon will take out any damaged cartilage and bone.  Your surgeon will then: ? Insert a prosthetic socket into the acetabulum of your pelvis. This is usually secured with screws. ? Remove the femoral head and replace it with a prosthetic ball and stem secured into the top of your femur. ? Place the ball into the socket and check the range of motion and stability of your new hip. ? Close the incision and apply a bandage over the surgical site. What happens after the procedure?  You will stay in a recovery area until the medicines have worn off.  Your vital signs, such as your pulse and blood pressure, will be monitored.  Once you are awake and stable, you will be taken to a hospital room.  You may be directed to take actions to help prevent blood clots. These may include: ? Walking soon after surgery, with someone assisting you. Moving around after surgery helps to improve blood flow. ? Taking medicines to thin your blood  (anticoagulants). ? Wearing compression stockings or using different types of devices.  You will receive physical therapy until you are doing well and your health care provider feels it is safe for you to go home. This information is not intended to replace advice given to you by your health care provider. Make sure you discuss any questions you have with your health care provider. Document Released: 10/29/2000 Document Revised: 03/26/2016 Document Reviewed: 09/23/2013 Elsevier Interactive Patient Education  [employer].
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
## 2018-07-17 ENCOUNTER — Ambulatory Visit (HOSPITAL_COMMUNITY)
Admission: RE | Admit: 2018-07-17 | Discharge: 2018-07-17 | Disposition: A | Discharge status: Home / Self Care | Payer: Medicare HMO | Source: Ambulatory Visit | Attending: Cardiology | Admitting: Cardiology

## 2018-07-17 DIAGNOSIS — I6523 Occlusion and stenosis of bilateral carotid arteries: Secondary | ICD-10-CM | POA: Diagnosis not present

## 2018-07-24 DIAGNOSIS — M25552 Pain in left hip: Secondary | ICD-10-CM | POA: Diagnosis not present

## 2018-08-15 ENCOUNTER — Other Ambulatory Visit: Payer: Self-pay | Admitting: Family Medicine

## 2018-08-15 ENCOUNTER — Ambulatory Visit (INDEPENDENT_AMBULATORY_CARE_PROVIDER_SITE_OTHER): Payer: Medicare HMO | Admitting: Cardiology

## 2018-08-15 ENCOUNTER — Encounter: Payer: Self-pay | Admitting: Cardiology

## 2018-08-15 VITALS — BP 128/68 | HR 86 | Ht 68.0 in | Wt 145.0 lb

## 2018-08-15 DIAGNOSIS — I350 Nonrheumatic aortic (valve) stenosis: Secondary | ICD-10-CM | POA: Diagnosis not present

## 2018-08-15 DIAGNOSIS — I10 Essential (primary) hypertension: Secondary | ICD-10-CM | POA: Diagnosis not present

## 2018-08-15 DIAGNOSIS — I2510 Atherosclerotic heart disease of native coronary artery without angina pectoris: Secondary | ICD-10-CM

## 2018-08-15 DIAGNOSIS — E782 Mixed hyperlipidemia: Secondary | ICD-10-CM | POA: Diagnosis not present

## 2018-08-15 DIAGNOSIS — I6523 Occlusion and stenosis of bilateral carotid arteries: Secondary | ICD-10-CM | POA: Diagnosis not present

## 2018-08-15 DIAGNOSIS — I6529 Occlusion and stenosis of unspecified carotid artery: Secondary | ICD-10-CM

## 2018-08-15 MED ORDER — ROSUVASTATIN CALCIUM 40 MG PO TABS: 40.0000 mg | ORAL_TABLET | Freq: Every day | ORAL | 1 refills | Status: DC

## 2018-08-15 NOTE — Patient Instructions (Signed)
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
## 2018-08-19 ENCOUNTER — Ambulatory Visit (INDEPENDENT_AMBULATORY_CARE_PROVIDER_SITE_OTHER): Payer: Medicare HMO | Admitting: Family Medicine

## 2018-08-19 ENCOUNTER — Encounter: Payer: Self-pay | Admitting: Family Medicine

## 2018-08-19 VITALS — BP 110/68 | HR 86 | Temp 98.2°F | Resp 14 | Ht 68.0 in | Wt 142.0 lb

## 2018-08-19 DIAGNOSIS — Z952 Presence of prosthetic heart valve: Secondary | ICD-10-CM

## 2018-08-19 DIAGNOSIS — IMO0001 Reserved for inherently not codable concepts without codable children: Secondary | ICD-10-CM

## 2018-08-19 DIAGNOSIS — I5022 Chronic systolic (congestive) heart failure: Secondary | ICD-10-CM | POA: Diagnosis not present

## 2018-08-19 DIAGNOSIS — I10 Essential (primary) hypertension: Secondary | ICD-10-CM | POA: Diagnosis not present

## 2018-08-19 DIAGNOSIS — I6529 Occlusion and stenosis of unspecified carotid artery: Secondary | ICD-10-CM | POA: Diagnosis not present

## 2018-08-19 DIAGNOSIS — E118 Type 2 diabetes mellitus with unspecified complications: Secondary | ICD-10-CM

## 2018-08-19 DIAGNOSIS — Z794 Long term (current) use of insulin: Secondary | ICD-10-CM
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
## 2018-08-20 LAB — COMPLETE METABOLIC PANEL WITH GFR
AG Ratio: 1.3 (calc) (ref 1.0–2.5)
ALBUMIN MSPROF: 4 g/dL (ref 3.6–5.1)
ALT: 16 U/L (ref 9–46)
AST: 18 U/L (ref 10–35)
Alkaline phosphatase (APISO): 184 U/L — ABNORMAL HIGH (ref 40–115)
BUN: 12 mg/dL (ref 7–25)
CALCIUM: 9.6 mg/dL (ref 8.6–10.3)
CO2: 28 mmol/L (ref 20–32)
Chloride: 97 mmol/L — ABNORMAL LOW (ref 98–110)
Creat: 0.93 mg/dL (ref 0.70–1.18)
GFR, EST AFRICAN AMERICAN: 90 mL/min/{1.73_m2} (ref >=60)
GFR, EST NON AFRICAN AMERICAN: 78 mL/min/{1.73_m2} (ref >=60)
GLOBULIN: 3 g/dL (ref 1.9–3.7)
GLUCOSE: 218 mg/dL — AB (ref 65–99)
Potassium: 4.7 mmol/L (ref 3.5–5.3)
SODIUM: 138 mmol/L (ref 135–146)
TOTAL PROTEIN: 7 g/dL (ref 6.1–8.1)
Total Bilirubin: 0.8 mg/dL (ref 0.2–1.2)

## 2018-08-20 LAB — CBC WITH DIFFERENTIAL/PLATELET
Absolute Monocytes: 571 cells/uL (ref 200–950)
Basophils Absolute: 88 cells/uL (ref 0–200)
Basophils Relative: 1.3 %
EOS PCT: 2.3 %
Eosinophils Absolute: 156 cells/uL (ref 15–500)
HEMATOCRIT: 44.4 % (ref 38.5–50.0)
Hemoglobin: 14.7 g/dL (ref 13.2–17.1)
LYMPHS ABS: 1394 {cells}/uL (ref 850–3900)
MCH: 28.4 pg (ref 27.0–33.0)
MCHC: 33.1 g/dL (ref 32.0–36.0)
MCV: 85.7 fL (ref 80.0–100.0)
MPV: 11.7 fL (ref 7.5–12.5)
Monocytes Relative: 8.4 %
NEUTROS ABS: 4590 {cells}/uL (ref 1500–7800)
Neutrophils Relative %: 67.5 %
Platelets: 229 10*3/uL (ref 140–400)
RBC: 5.18 10*6/uL (ref 4.20–5.80)
RDW: 13.5 % (ref 11.0–15.0)
Total Lymphocyte: 20.5 %
WBC: 6.8 10*3/uL (ref 3.8–10.8)

## 2018-08-20 LAB — HEMOGLOBIN A1C
EAG (MMOL/L): 11.1 (calc)
HEMOGLOBIN A1C: 8.6 %{Hb} — AB (ref <5.7)
Mean Plasma Glucose: 200 (calc)

## 2018-08-21 ENCOUNTER — Other Ambulatory Visit: Payer: Self-pay | Admitting: Family Medicine

## 2018-08-21 DIAGNOSIS — E118 Type 2 diabetes mellitus with unspecified complications: Principal | ICD-10-CM

## 2018-08-21 DIAGNOSIS — Z794 Long term (current) use of insulin: Principal | ICD-10-CM

## 2018-08-21 DIAGNOSIS — IMO0001 Reserved for inherently not codable concepts without codable children: Secondary | ICD-10-CM

## 2018-08-21 MED ORDER — DAPAGLIFLOZIN PROPANEDIOL 10 MG PO TABS: 10.0000 mg | ORAL_TABLET | Freq: Every day | ORAL | 1 refills | Status: DC

## 2018-08-25 ENCOUNTER — Ambulatory Visit: Payer: Medicare HMO | Admitting: Family Medicine

## 2018-08-25 ENCOUNTER — Ambulatory Visit: Payer: Medicare HMO | Admitting: Physician Assistant

## 2018-08-28 ENCOUNTER — Other Ambulatory Visit: Payer: Self-pay | Admitting: Family Medicine

## 2018-08-28 ENCOUNTER — Telehealth: Payer: Self-pay | Admitting: Family Medicine

## 2018-08-28 DIAGNOSIS — K8020 Calculus of gallbladder without cholecystitis without obstruction: Secondary | ICD-10-CM | POA: Diagnosis not present

## 2018-08-28 DIAGNOSIS — K859 Acute pancreatitis without necrosis or infection, unspecified: Secondary | ICD-10-CM | POA: Diagnosis not present

## 2018-08-28 DIAGNOSIS — R945 Abnormal results of liver function studies: Secondary | ICD-10-CM | POA: Diagnosis not present

## 2018-08-28 MED ORDER — CANAGLIFLOZIN 300 MG PO TABS: 300.0000 mg | ORAL_TABLET | Freq: Every day | ORAL | 3 refills | Status: DC

## 2018-08-28 NOTE — Telephone Encounter (Signed)
Wilder Glade not covered through ins - per Dr. Dennard Schaumann ok to change to Invokana 300mg  qd - med sent to pharm.

## 2018-09-01 ENCOUNTER — Ambulatory Visit (INDEPENDENT_AMBULATORY_CARE_PROVIDER_SITE_OTHER): Payer: Medicare HMO | Admitting: Vascular Surgery

## 2018-09-01 ENCOUNTER — Encounter: Payer: Self-pay | Admitting: Vascular Surgery

## 2018-09-01 VITALS — BP 137/68 | HR 73 | Temp 98.4°F | Resp 18 | Ht 68.0 in | Wt 141.0 lb

## 2018-09-01 DIAGNOSIS — I6521 Occlusion and stenosis of right carotid artery: Secondary | ICD-10-CM | POA: Diagnosis not present

## 2018-09-01 NOTE — Progress Notes (Signed)
V[facility] and [facility]in Specialist of [facility] office  Patient name: [name] MRN: [id number] DOB: July 14, 1941 Sex: male  REASON FOR CONSULT: Evaluation of asymptomatic right carotid stenosis  HPI: [name] is a 78 y.o. male, who is in today for discussion of asymptomatic right carotid stenosis.  He has had known moderate stenosis.  He denies any prior history of amaurosis fugax, a aphasia, TIA or stroke.  He is ambidextrous.  He has had a prior history of aortic valve replacement.  Is contemplating hip replacement.  Past Medical History:  Diagnosis Date  . Aortic stenosis, mild   . BPH (benign prostatic hypertrophy)   . CAD (coronary artery disease)   . 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 Constipation   . Diabetes mellitus   . GERD (gastroesophageal reflux disease)   . History of cataract   . Hyperlipidemia   . Hypertension   . Nephrolithiasis   . Vitamin D deficiency     Family History  Problem Relation Age of Onset  . Heart disease Mother   . Congestive Heart Failure Father   . Heart failure Father   . Heart failure Sister 73    SOCIAL HISTORY: Social History   Socioeconomic History  . Marital status: Married    Spouse name: Not on file  . Number of children: Not on file  . Years of education: Not on file  . Highest education level: Not on file  Occupational History  . Not on file  Social Needs  . Financial resource strain: Not on file  . Food insecurity:    Worry: Not on file    Inability: Not on file  . Transportation needs:    Medical: Not on file    Non-medical: Not on file  Tobacco Use  . Smoking status: Former Smoker    Packs/day: 1.00    Years: 30.00    Pack years: 30.00    Last attempt to quit: 06/10/1999    Years since quitting: 19.2  . Smokeless tobacco: Former Systems developer    Types: Chew    Quit date: 04/09/2014  . Tobacco comment: Quit >15 years ago  Substance and Sexual  Activity  . Alcohol use: No    Alcohol/week: 0.0 standard drinks  . Drug use: No  . Sexual activity: Not Currently  Lifestyle  . Physical activity:    Days per week: Not on file    Minutes per session: Not on file  . Stress: Not on file  Relationships  . Social connections:    Talks on phone: Not on file    Gets together: Not on file    Attends religious service: Not on file    Active member of club or organization: Not on file    Attends meetings of clubs or organizations: Not on file    Relationship status: Not on file  . Intimate partner violence:    Fear of current or ex partner: Not on file    Emotionally abused: Not on file    Physically abused: Not on file    Forced sexual activity: Not on file  Other Topics Concern  . Not on file  Social History Narrative   Married for >44 years    Allergies  Allergen Reactions  . Actos [Pioglitazone] Other (See Comments)    Cramps in legs   . Metformin And Related Diarrhea  . Morphine     REACTION: hives  .  Statins     REACTION: leg cramps but tolerates pravastatin    Current Outpatient Medications  Medication Sig Dispense Refill  . aspirin 81 MG EC tablet Take 1 tablet (81 mg total) by mouth daily. Swallow whole. 90 tablet 1  . Cholecalciferol (VITAMIN D) 2000 units tablet Take 1 tablet (2,000 Units total) by mouth daily. 90 tablet 1  . empagliflozin (JARDIANCE) 25 MG TABS tablet Take 25 mg by mouth daily. 30 tablet 11  . fish oil-omega-3 fatty acids 1000 MG capsule Take 1 g by mouth daily.     . Insulin Glargine (BASAGLAR KWIKPEN) 100 UNIT/ML SOPN Inject 0.22 mLs (22 Units total) into the skin daily. Give number of units as directed at office visit, up to 30 units daily. (Patient taking differently: Inject 42 Units into the skin daily. ) 15 pen 3  . lisinopril (PRINIVIL,ZESTRIL) 2.5 MG tablet TAKE 1 TABLET DAILY (Patient not taking: Reported on 08/19/2018) 90 tablet 1  . meclizine (ANTIVERT) 12.5 MG tablet Take 1 tablet  (12.5 mg total) by mouth 3 (three) times daily as needed for dizziness. 20 tablet 0  . metoprolol succinate (TOPROL-XL) 50 MG 24 hr tablet TAKE 1 TABLET DAILY WITH ORIMMEDIATELY FOLLOWING A    MEAL 90 tablet 1  . omeprazole (PRILOSEC) 20 MG capsule TAKE 1 CAPSULE DAILY 90 capsule 1  . rosuvastatin (CRESTOR) 40 MG tablet Take 1 tablet (40 mg total) by mouth daily. 90 tablet 1   No current facility-administered medications for this visit.     REVIEW OF SYSTEMS:  [X]  denotes positive finding, [ ]  denotes negative finding Cardiac  Comments:  Chest pain or chest pressure:    Shortness of breath upon exertion: x   Short of breath when lying flat:    Irregular heart rhythm:        Vascular    Pain in calf, thigh, or hip brought on by ambulation: x   Pain in feet at night that wakes you up from your sleep:  x   Blood clot in your veins:    Leg swelling:         Pulmonary    Oxygen at home:    Productive cough:     Wheezing:         Neurologic    Sudden weakness in arms or legs:  x   Sudden numbness in arms or legs:     Sudden onset of difficulty speaking or slurred speech:    Temporary loss of vision in one eye:     Problems with dizziness:  x       Gastrointestinal    Blood in stool:     Vomited blood:         Genitourinary    Burning when urinating:     Blood in urine:        Psychiatric    Major depression:         Hematologic    Bleeding problems:    Problems with blood clotting too easily:        Skin    Rashes or ulcers:        Constitutional    Fever or chills:      PHYSICAL EXAM: There were no vitals filed for this visit.  GENERAL: The patient is a well-nourished male, in no acute distress. The vital signs are documented above. CARDIOVASCULAR: 2+ radial and 2+ dorsalis pedis pulses bilaterally.  Carotid arteries without bruits bilaterally PULMONARY: There is good air  exchange  ABDOMEN: Soft and non-tender  MUSCULOSKELETAL: There are no major deformities  or cyanosis. NEUROLOGIC: No focal weakness or paresthesias are detected. SKIN: There are no ulcers or rashes noted. PSYCHIATRIC: The patient has a normal affect.  DATA:  Carotid duplex on 07/17/2018 revealed velocities consistent with 60- 79% right carotid stenosis.  No significant left carotid stenosis  MEDICAL ISSUES: Moderate asymptomatic right carotid stenosis.  I discussed this at length with the patient.  I have recommended follow-up in 1 year with repeat carotid duplex.  I did review symptoms of carotid disease and he knows to present immediately should this occur.  I do not feel his moderate asymptomatic disease would put him at any increased risk regarding potential for elective hip surgery.   Rosetta Posner, MD FACS [facility] of [facility] Tel [phone] Pager [phone]

## 2018-09-03 ENCOUNTER — Telehealth: Payer: Self-pay | Admitting: Family Medicine

## 2018-09-03 NOTE — Telephone Encounter (Signed)
We have tried [place] and they too are expensive.

## 2018-09-03 NOTE — Telephone Encounter (Addendum)
Patient is calling to say that the jardiance is too expensive  Please call patient to discuss at [phone]

## 2018-09-04 MED ORDER — METFORMIN HCL 500 MG PO TABS: 500.0000 mg | ORAL_TABLET | Freq: Two times a day (BID) | ORAL | 3 refills | Status: DC

## 2018-09-04 NOTE — Telephone Encounter (Signed)
Please remove jardiance from med list and try him on metformin 500 mg pobid.

## 2018-09-04 NOTE — Telephone Encounter (Signed)
Pt aware and med sent to pharm 

## 2018-09-15 DIAGNOSIS — R69 Illness, unspecified: Secondary | ICD-10-CM | POA: Diagnosis not present

## 2018-09-22 ENCOUNTER — Encounter: Payer: Self-pay | Admitting: Family Medicine

## 2018-09-22 ENCOUNTER — Ambulatory Visit (INDEPENDENT_AMBULATORY_CARE_PROVIDER_SITE_OTHER): Payer: Medicare HMO | Admitting: Family Medicine

## 2018-09-22 VITALS — BP 112/56 | HR 90 | Temp 97.6°F | Resp 14 | Ht 68.0 in | Wt 140.0 lb

## 2018-09-22 DIAGNOSIS — I10 Essential (primary) hypertension: Secondary | ICD-10-CM | POA: Diagnosis not present

## 2018-09-22 DIAGNOSIS — Z794 Long term (current) use of insulin: Secondary | ICD-10-CM

## 2018-09-22 DIAGNOSIS — IMO0001 Reserved for inherently not codable concepts without codable children: Secondary | ICD-10-CM

## 2018-09-22 DIAGNOSIS — E118 Type 2 diabetes mellitus with unspecified complications: Secondary | ICD-10-CM | POA: Diagnosis not present

## 2018-09-22 DIAGNOSIS — I5022 Chronic systolic (congestive) heart failure: Secondary | ICD-10-CM
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
## 2018-09-29 ENCOUNTER — Other Ambulatory Visit: Payer: Self-pay

## 2018-09-29 MED ORDER — METOPROLOL SUCCINATE ER 50 MG PO TB24: ORAL_TABLET | ORAL | 1 refills | Status: DC

## 2018-09-29 MED ORDER — LISINOPRIL 2.5 MG PO TABS: 2.5000 mg | ORAL_TABLET | Freq: Every day | ORAL | 1 refills | Status: DC

## 2018-09-29 MED ORDER — OMEPRAZOLE 20 MG PO CPDR: 20.0000 mg | DELAYED_RELEASE_CAPSULE | Freq: Every day | ORAL | 1 refills | Status: DC

## 2018-10-23 ENCOUNTER — Telehealth: Payer: Self-pay | Admitting: Family Medicine

## 2018-10-23 NOTE — Telephone Encounter (Signed)
Pt's wife called [facility] stating that pt is gaging to the point of feeling like he has to vomit every time he eats something.

## 2018-10-24 MED ORDER — GLUCOSE BLOOD VI STRP: ORAL_STRIP | 5 refills | Status: DC

## 2018-10-24 MED ORDER — ONDANSETRON HCL 4 MG PO TABS: 4.0000 mg | ORAL_TABLET | Freq: Three times a day (TID) | ORAL | 0 refills | Status: DC | PRN

## 2018-10-24 NOTE — Addendum Note (Signed)
Addended by: Shary Decamp B on: 10/24/2018 12:27 PM   Modules accepted: Orders

## 2018-10-24 NOTE — Telephone Encounter (Signed)
Be glad to see him today.  Meanwhile, reduce insulin dose to 10 units until eating more.  Start zofran 4 mg poq 8 hrs prn vomiting and try liquid diet (gatorade, soup, water, maybe jello)

## 2018-10-24 NOTE — Telephone Encounter (Signed)
Left detailed message on Wife's mobile phone. Med sent to pharm.

## 2018-11-01 DIAGNOSIS — R69 Illness, unspecified: Secondary | ICD-10-CM | POA: Diagnosis not present

## 2018-11-04 ENCOUNTER — Telehealth: Payer: Self-pay | Admitting: Family Medicine

## 2018-11-04 NOTE — Telephone Encounter (Signed)
Pt called and states that he is out of basaglar and his fbs are between 89-90. Per Dr. Dennard Schaumann he can switch to novolin 70/30 - 20 units qam and 10 units qpm. Pt states that he can not afford that at this time - I explained that it is $25.00 and he does not have the money now for that. Per Dr. Dennard Schaumann we are out of options. Pt aware and verbalizes understanding.

## 2018-11-10 ENCOUNTER — Inpatient Hospital Stay: Admit: 2018-11-10 | Payer: Medicare HMO | Admitting: Orthopedic Surgery

## 2018-11-10 SURGERY — CONVERSION, PREVIOUS HIP SURGERY, TO TOTAL HIP ARTHROPLASTY
Anesthesia: Spinal | Laterality: Left

## 2018-11-11 ENCOUNTER — Inpatient Hospital Stay (HOSPITAL_COMMUNITY): Payer: Medicare HMO

## 2018-11-11 ENCOUNTER — Encounter (HOSPITAL_COMMUNITY): Payer: Self-pay | Admitting: Emergency Medicine

## 2018-11-11 ENCOUNTER — Other Ambulatory Visit: Payer: Self-pay

## 2018-11-11 ENCOUNTER — Inpatient Hospital Stay (HOSPITAL_COMMUNITY)
Admission: EM | Admit: 2018-11-11 | Discharge: 2018-11-14 | DRG: 439 | Disposition: A | Discharge status: Home / Self Care | Payer: Medicare HMO | Attending: Internal Medicine | Admitting: Internal Medicine

## 2018-11-11 ENCOUNTER — Emergency Department (HOSPITAL_COMMUNITY): Payer: Medicare HMO

## 2018-11-11 DIAGNOSIS — E119 Type 2 diabetes mellitus without complications: Secondary | ICD-10-CM | POA: Diagnosis not present

## 2018-11-11 DIAGNOSIS — Z87891 Personal history of nicotine dependence: Secondary | ICD-10-CM

## 2018-11-11 DIAGNOSIS — I447 Left bundle-branch block, unspecified: Secondary | ICD-10-CM | POA: Diagnosis not present

## 2018-11-11 DIAGNOSIS — N179 Acute kidney failure, unspecified: Secondary | ICD-10-CM

## 2018-11-11 DIAGNOSIS — Z85828 Personal history of other malignant neoplasm of skin: Secondary | ICD-10-CM | POA: Diagnosis not present

## 2018-11-11 DIAGNOSIS — K861 Other chronic pancreatitis: Secondary | ICD-10-CM | POA: Diagnosis present

## 2018-11-11 DIAGNOSIS — I25119 Atherosclerotic heart disease of native coronary artery with unspecified angina pectoris: Secondary | ICD-10-CM | POA: Diagnosis not present

## 2018-11-11 DIAGNOSIS — I350 Nonrheumatic aortic (valve) stenosis: Secondary | ICD-10-CM

## 2018-11-11 DIAGNOSIS — E1165 Type 2 diabetes mellitus with hyperglycemia: Secondary | ICD-10-CM | POA: Diagnosis present

## 2018-11-11 DIAGNOSIS — E559 Vitamin D deficiency, unspecified: Secondary | ICD-10-CM | POA: Diagnosis present

## 2018-11-11 DIAGNOSIS — K769 Liver disease, unspecified: Secondary | ICD-10-CM | POA: Diagnosis not present

## 2018-11-11 DIAGNOSIS — Z8249 Family history of ischemic heart disease and other diseases of the circulatory system: Secondary | ICD-10-CM | POA: Diagnosis not present

## 2018-11-11 DIAGNOSIS — Z7982 Long term (current) use of aspirin: Secondary | ICD-10-CM | POA: Diagnosis not present

## 2018-11-11 DIAGNOSIS — R630 Anorexia: Secondary | ICD-10-CM

## 2018-11-11 DIAGNOSIS — Z79899 Other long term (current) drug therapy: Secondary | ICD-10-CM | POA: Diagnosis not present

## 2018-11-11 DIAGNOSIS — K219 Gastro-esophageal reflux disease without esophagitis: Secondary | ICD-10-CM | POA: Diagnosis present

## 2018-11-11 DIAGNOSIS — I2510 Atherosclerotic heart disease of native coronary artery without angina pectoris: Secondary | ICD-10-CM | POA: Diagnosis present

## 2018-11-11 DIAGNOSIS — E785 Hyperlipidemia, unspecified: Secondary | ICD-10-CM | POA: Diagnosis not present

## 2018-11-11 DIAGNOSIS — K5900 Constipation, unspecified: Secondary | ICD-10-CM | POA: Diagnosis present

## 2018-11-11 DIAGNOSIS — K7460 Unspecified cirrhosis of liver: Secondary | ICD-10-CM | POA: Diagnosis not present

## 2018-11-11 DIAGNOSIS — Z952 Presence of prosthetic heart valve: Secondary | ICD-10-CM

## 2018-11-11 DIAGNOSIS — K851 Biliary acute pancreatitis without necrosis or infection: Secondary | ICD-10-CM | POA: Diagnosis not present

## 2018-11-11 DIAGNOSIS — C220 Liver cell carcinoma: Secondary | ICD-10-CM | POA: Diagnosis not present

## 2018-11-11 DIAGNOSIS — K7689 Other specified diseases of liver: Secondary | ICD-10-CM | POA: Diagnosis not present

## 2018-11-11 DIAGNOSIS — E782 Mixed hyperlipidemia: Secondary | ICD-10-CM | POA: Diagnosis not present

## 2018-11-11 DIAGNOSIS — N400 Enlarged prostate without lower urinary tract symptoms: Secondary | ICD-10-CM | POA: Diagnosis present

## 2018-11-11 DIAGNOSIS — Z794 Long term (current) use of insulin: Secondary | ICD-10-CM | POA: Diagnosis not present

## 2018-11-11 DIAGNOSIS — R634 Abnormal weight loss: Secondary | ICD-10-CM | POA: Diagnosis not present

## 2018-11-11 DIAGNOSIS — K859 Acute pancreatitis without necrosis or infection, unspecified: Secondary | ICD-10-CM | POA: Diagnosis present

## 2018-11-11 DIAGNOSIS — E869 Volume depletion, unspecified: Secondary | ICD-10-CM | POA: Diagnosis present

## 2018-11-11 DIAGNOSIS — K8020 Calculus of gallbladder without cholecystitis without obstruction: Secondary | ICD-10-CM | POA: Diagnosis not present

## 2018-11-11 DIAGNOSIS — I10 Essential (primary) hypertension: Secondary | ICD-10-CM | POA: Diagnosis present

## 2018-11-11 DIAGNOSIS — E1169 Type 2 diabetes mellitus with other specified complication: Secondary | ICD-10-CM

## 2018-11-11 DIAGNOSIS — K8689 Other specified diseases of pancreas: Secondary | ICD-10-CM

## 2018-11-11 DIAGNOSIS — Z953 Presence of xenogenic heart valve: Secondary | ICD-10-CM | POA: Diagnosis not present

## 2018-11-11 DIAGNOSIS — K8050 Calculus of bile duct without cholangitis or cholecystitis without obstruction: Secondary | ICD-10-CM

## 2018-11-11 LAB — CBC WITH DIFFERENTIAL/PLATELET
Abs Immature Granulocytes: 0.02 10*3/uL (ref 0.00–0.07)
Basophils Absolute: 0.1 10*3/uL (ref 0.0–0.1)
Basophils Relative: 1 %
Eosinophils Absolute: 0.2 10*3/uL (ref 0.0–0.5)
Eosinophils Relative: 2 %
HCT: 46.4 % (ref 39.0–52.0)
Hemoglobin: 15 g/dL (ref 13.0–17.0)
Immature Granulocytes: 0 %
Lymphocytes Relative: 17 %
Lymphs Abs: 1.3 10*3/uL (ref 0.7–4.0)
MCH: 27.9 pg (ref 26.0–34.0)
MCHC: 32.3 g/dL (ref 30.0–36.0)
MCV: 86.4 fL (ref 80.0–100.0)
Monocytes Absolute: 0.6 10*3/uL (ref 0.1–1.0)
Monocytes Relative: 8 %
Neutro Abs: 5.5 10*3/uL (ref 1.7–7.7)
Neutrophils Relative %: 72 %
Platelets: 179 10*3/uL (ref 150–400)
RBC: 5.37 MIL/uL (ref 4.22–5.81)
RDW: 16.4 % — ABNORMAL HIGH (ref 11.5–15.5)
WBC: 7.7 10*3/uL (ref 4.0–10.5)
nRBC: 0 % (ref 0.0–0.2)

## 2018-11-11 LAB — COMPREHENSIVE METABOLIC PANEL
ALT: 82 U/L — ABNORMAL HIGH (ref 0–44)
AST: 82 U/L — ABNORMAL HIGH (ref 15–41)
Albumin: 3.7 g/dL (ref 3.5–5.0)
Alkaline Phosphatase: 536 U/L — ABNORMAL HIGH (ref 38–126)
Anion gap: 10 (ref 5–15)
BUN: 16 mg/dL (ref 8–23)
CO2: 27 mmol/L (ref 22–32)
Calcium: 9.8 mg/dL (ref 8.9–10.3)
Chloride: 97 mmol/L — ABNORMAL LOW (ref 98–111)
Creatinine, Ser: 1.3 mg/dL — ABNORMAL HIGH (ref 0.61–1.24)
GFR calc Af Amer: >60 mL/min (ref >60)
GFR calc non Af Amer: 52 mL/min — ABNORMAL LOW (ref >60)
Glucose, Bld: 221 mg/dL — ABNORMAL HIGH (ref 70–99)
Potassium: 4.3 mmol/L (ref 3.5–5.1)
Sodium: 134 mmol/L — ABNORMAL LOW (ref 135–145)
Total Bilirubin: 1.2 mg/dL (ref 0.3–1.2)
Total Protein: 7.5 g/dL (ref 6.5–8.1)

## 2018-11-11 LAB — URINALYSIS, ROUTINE W REFLEX MICROSCOPIC
Bacteria, UA: NONE SEEN
Bilirubin Urine: NEGATIVE
Glucose, UA: >=500 mg/dL — AB
Ketones, ur: NEGATIVE mg/dL
Leukocytes,Ua: NEGATIVE
Nitrite: NEGATIVE
Protein, ur: 30 mg/dL — AB
Specific Gravity, Urine: 1.007 (ref 1.005–1.030)
pH: 7 (ref 5.0–8.0)

## 2018-11-11 LAB — LACTIC ACID, PLASMA
Lactic Acid, Venous: 1.6 mmol/L (ref 0.5–1.9)
Lactic Acid, Venous: 1.7 mmol/L (ref 0.5–1.9)

## 2018-11-11 LAB — LIPASE, BLOOD: Lipase: 1405 U/L — ABNORMAL HIGH (ref 11–51)

## 2018-11-11 LAB — GLUCOSE, CAPILLARY
Glucose-Capillary: 174 mg/dL — ABNORMAL HIGH (ref 70–99)
Glucose-Capillary: 102 mg/dL — ABNORMAL HIGH (ref 70–99)

## 2018-11-11 LAB — TROPONIN I: Troponin I: <0.03 ng/mL (ref <0.03)

## 2018-11-11 MED ORDER — GADOBUTROL 1 MMOL/ML IV SOLN
6.0000 mL | Freq: Once | INTRAVENOUS | Status: AC | PRN
  Administered 2018-11-11: 6 mL via INTRAVENOUS

## 2018-11-11 MED ORDER — SODIUM CHLORIDE 0.9 % IV SOLN
INTRAVENOUS | Status: DC
  Administered 2018-11-11 – 2018-11-14 (×4): via INTRAVENOUS

## 2018-11-11 MED ORDER — SODIUM CHLORIDE 0.9 % IV BOLUS
500.0000 mL | Freq: Once | INTRAVENOUS | Status: AC
  Administered 2018-11-11: 500 mL via INTRAVENOUS

## 2018-11-11 MED ORDER — ACETAMINOPHEN 325 MG PO TABS: 650.0000 mg | ORAL_TABLET | Freq: Four times a day (QID) | ORAL | Status: DC | PRN

## 2018-11-11 MED ORDER — ONDANSETRON HCL 4 MG PO TABS: 4.0000 mg | ORAL_TABLET | Freq: Four times a day (QID) | ORAL | Status: DC | PRN

## 2018-11-11 MED ORDER — INSULIN ASPART 100 UNIT/ML ~~LOC~~ SOLN
0.0000 [IU] | Freq: Three times a day (TID) | SUBCUTANEOUS | Status: DC
  Administered 2018-11-11 – 2018-11-13 (×3): 2 [IU] via SUBCUTANEOUS

## 2018-11-11 MED ORDER — SODIUM CHLORIDE 0.9 % IV SOLN
INTRAVENOUS | Status: DC
  Administered 2018-11-11: 08:00:00 via INTRAVENOUS

## 2018-11-11 MED ORDER — ENOXAPARIN SODIUM 40 MG/0.4ML ~~LOC~~ SOLN
40.0000 mg | SUBCUTANEOUS | Status: DC
  Administered 2018-11-11 – 2018-11-13 (×3): 40 mg via SUBCUTANEOUS
  Filled 2018-11-11 (×4): qty 0.4

## 2018-11-11 MED ORDER — ONDANSETRON HCL 4 MG/2ML IJ SOLN: 4.0000 mg | Freq: Four times a day (QID) | INTRAMUSCULAR | Status: DC | PRN

## 2018-11-11 MED ORDER — ROSUVASTATIN CALCIUM 20 MG PO TABS
40.0000 mg | ORAL_TABLET | Freq: Every day | ORAL | Status: DC
  Administered 2018-11-11 – 2018-11-14 (×4): 40 mg via ORAL
  Filled 2018-11-11 (×4): qty 2

## 2018-11-11 MED ORDER — MECLIZINE HCL 12.5 MG PO TABS: 12.5000 mg | ORAL_TABLET | Freq: Three times a day (TID) | ORAL | Status: DC | PRN

## 2018-11-11 MED ORDER — HYDROMORPHONE HCL 1 MG/ML IJ SOLN: 0.5000 mg | INTRAMUSCULAR | Status: DC | PRN

## 2018-11-11 MED ORDER — PANTOPRAZOLE SODIUM 40 MG PO TBEC
40.0000 mg | DELAYED_RELEASE_TABLET | Freq: Every day | ORAL | Status: DC
  Administered 2018-11-11 – 2018-11-14 (×4): 40 mg via ORAL
  Filled 2018-11-11 (×4): qty 1

## 2018-11-11 MED ORDER — MORPHINE SULFATE (PF) 2 MG/ML IV SOLN: 1.0000 mg | INTRAVENOUS | Status: DC | PRN

## 2018-11-11 MED ORDER — ONDANSETRON HCL 4 MG/2ML IJ SOLN
4.0000 mg | Freq: Once | INTRAMUSCULAR | Status: AC
  Administered 2018-11-11: 4 mg via INTRAVENOUS
  Filled 2018-11-11: qty 2

## 2018-11-11 MED ORDER — ACETAMINOPHEN 650 MG RE SUPP: 650.0000 mg | Freq: Four times a day (QID) | RECTAL | Status: DC | PRN
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
## 2018-11-11 NOTE — ED Triage Notes (Signed)
Pt states he has been nauseated for about a week and that he "gags" a lot. Able to eat and drink. Pt states he's very weak.
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
## 2018-11-12 ENCOUNTER — Inpatient Hospital Stay (HOSPITAL_COMMUNITY): Payer: Medicare HMO

## 2018-11-12 ENCOUNTER — Encounter (HOSPITAL_COMMUNITY): Payer: Self-pay | Admitting: Gastroenterology

## 2018-11-12 DIAGNOSIS — I350 Nonrheumatic aortic (valve) stenosis: Secondary | ICD-10-CM

## 2018-11-12 DIAGNOSIS — K769 Liver disease, unspecified: Secondary | ICD-10-CM

## 2018-11-12 DIAGNOSIS — K8689 Other specified diseases of pancreas: Secondary | ICD-10-CM

## 2018-11-12 DIAGNOSIS — R630 Anorexia: Secondary | ICD-10-CM

## 2018-11-12 DIAGNOSIS — R634 Abnormal weight loss: Secondary | ICD-10-CM

## 2018-11-12 DIAGNOSIS — Z952 Presence of prosthetic heart valve: Secondary | ICD-10-CM

## 2018-11-12 DIAGNOSIS — K8020 Calculus of gallbladder without cholecystitis without obstruction: Secondary | ICD-10-CM

## 2018-11-12 DIAGNOSIS — E782 Mixed hyperlipidemia: Secondary | ICD-10-CM

## 2018-11-12 LAB — BASIC METABOLIC PANEL
Anion gap: 7 (ref 5–15)
BUN: 10 mg/dL (ref 8–23)
CO2: 24 mmol/L (ref 22–32)
Calcium: 9 mg/dL (ref 8.9–10.3)
Chloride: 107 mmol/L (ref 98–111)
Creatinine, Ser: 1.01 mg/dL (ref 0.61–1.24)
GFR calc Af Amer: >60 mL/min (ref >60)
GFR calc non Af Amer: >60 mL/min (ref >60)
Glucose, Bld: 115 mg/dL — ABNORMAL HIGH (ref 70–99)
Potassium: 3.8 mmol/L (ref 3.5–5.1)
Sodium: 138 mmol/L (ref 135–145)

## 2018-11-12 LAB — CBC
HCT: 42.7 % (ref 39.0–52.0)
Hemoglobin: 13.4 g/dL (ref 13.0–17.0)
MCH: 27.5 pg (ref 26.0–34.0)
MCHC: 31.4 g/dL (ref 30.0–36.0)
MCV: 87.5 fL (ref 80.0–100.0)
Platelets: 134 10*3/uL — ABNORMAL LOW (ref 150–400)
RBC: 4.88 MIL/uL (ref 4.22–5.81)
RDW: 16.7 % — ABNORMAL HIGH (ref 11.5–15.5)
WBC: 5.1 10*3/uL (ref 4.0–10.5)
nRBC: 0 % (ref 0.0–0.2)

## 2018-11-12 LAB — GLUCOSE, CAPILLARY
Glucose-Capillary: 194 mg/dL — ABNORMAL HIGH (ref 70–99)
Glucose-Capillary: 96 mg/dL (ref 70–99)
Glucose-Capillary: 195 mg/dL — ABNORMAL HIGH (ref 70–99)
Glucose-Capillary: 82 mg/dL (ref 70–99)
Glucose-Capillary: 130 mg/dL — ABNORMAL HIGH (ref 70–99)

## 2018-11-12 LAB — LIPASE, BLOOD: Lipase: 56 U/L — ABNORMAL HIGH (ref 11–51)

## 2018-11-12 NOTE — Progress Notes (Addendum)
PROGRESS NOTE  [name] YWV:[id number] DOB: [birth year] DOA: 11/11/2018 PCP: Susy Frizzle, MD  Brief History:  78 year old male with history of aortic stenosis, coronary disease, diabetes mellitus, hypertension, hyperlipidemia presenting with approximate 2-week history of anorexia and epigastric abdominal pain.  The patient endorses some nausea and poor oral intake and generalized weakness, but denies any vomiting, diarrhea, fevers, chills.  He states that in the past month he has been started on metformin and rosuvastatin by his primary care provider.However, in the past 3 days the patient describes some dry heaving episodes.  As result, the patient presented for further evaluation.  The patient had stated that eating meals would occasionally make his abdominal pain worse, but occasionally it would help his pain as well.  CT of the abdomen and pelvis showed a right central lower lobe retracted appearing region with hypodense internal lesions.  There was also noted a small calcification the pancreas head in the general vicinity of the distal common bile duct.  MRCP was obtained and showed a nodular liver contour associated with enlargement of the lateral left liver suggesting cirrhosis.  Because of the patient's movement, the study was degraded.  There was diffuse pancreatic atrophy with diffuse dilatation of the main pancreatic duct measuring up to 6 mm.  There was early ill-defined hyper enhancement involving segment 4 suggesting ill-defined lesion measuring up to 6.3 x 4.5 cm. Potential central focus of differentially increased washout. Lesion difficult to assess given motion degradation on postcontrast imaging but infiltrative HCC cannot be excluded.  Assessment/Plan: Acute pancreatitis -Likely multifactorial including medication induced (metformin and rosuvastatin) as well as possibly gallstone pancreatitis -Continue clear liquids -Lipase 1405 -Check lipid panel -GI  consult -general surgery consult  Nodular liver/right liver lesion -Check alpha-fetoprotein -MRCP noted diffuse pancreatic atrophy with diffuse ductal dilatation the main pancreatic duct up to 6 mm -GI consult  -A.m. LFTs  Transaminasemia -hep B surface antigen -hep C antibody -AMA -ASMA  Acute kidney injury -Secondary to volume depletion -Baseline creatinine 0.7-0.9 -Serum creatinine peaked 1.30 -Continue IV fluids  Essential hypertension -Continue metoprolol succinate  Diabetes mellitus type 2, uncontrolled with hyperglycemia -Continue NovoLog sliding scale -Holding home dose insulin glargine due to poor p.o. intake -08/19/18 A1C--8.6  Hyperlipidemia -Continue statin  Aortic stenosis -Status post aortic valve replacement with bioprosthetic valve       Disposition Plan:   Home in 2-3 days  Family Communication:  Spouse updated on phone  Consultants:  GI, general surgery  Code Status:  FULL   DVT Prophylaxis:   [place] Lovenox   Procedures: As Listed in Progress Note Above  Antibiotics: None       Subjective: Patient states that abdominal pain is low but better than yesterday.  She denies any vomiting but has some nausea.  She denies any fevers, chills, chest pain, shortness breath, diarrhea, dysuria, hematuria.  Objective: Vitals:   11/11/18 1300 11/11/18 1926 11/11/18 2124 11/12/18 0522  BP: 135/84  128/67 131/60  Pulse: 81  81 88  Resp: 16  18 16   Temp: (!) 97.5 F (36.4 C)   98.2 F (36.8 C)  TempSrc: Oral   Oral  SpO2: 100% 99% 99% 100%  Weight:      Height:        Intake/Output Summary (Last 24 hours) at 11/12/2018 0851 Last data filed at 11/12/2018 0600 Gross per 24 hour  Intake 2055.64 ml  Output 1550 ml  Net 505.64  ml   Weight change: -10.2 kg Exam:   General:  Pt is alert, follows commands appropriately, not in acute distress  HEENT: No icterus, No thrush, No neck mass, /AT  Cardiovascular: RRR, S1/S2, no rubs, no  gallops  Respiratory: Bibasilar crackles.  No wheezing.  Good air movement.  Abdomen: Soft/+BS, non tender, non distended, no guarding  Extremities: No edema, No lymphangitis, No petechiae, No rashes, no synovitis   Data Reviewed: I have personally reviewed following labs and imaging studies Basic Metabolic Panel: Recent Labs  Lab 11/11/18 0530 11/12/18 0535  NA 134* 138  K 4.3 3.8  CL 97* 107  CO2 27 24  GLUCOSE 221* 115*  BUN 16 10  CREATININE 1.30* 1.01  CALCIUM 9.8 9.0   Liver Function Tests: Recent Labs  Lab 11/11/18 0530  AST 82*  ALT 82*  ALKPHOS 536*  BILITOT 1.2  PROT 7.5  ALBUMIN 3.7   Recent Labs  Lab 11/11/18 0600 11/12/18 0535  LIPASE 1,405* 56*   No results for input(s): AMMONIA in the last 168 hours. Coagulation Profile: No results for input(s): INR, PROTIME in the last 168 hours. CBC: Recent Labs  Lab 11/11/18 0530 11/12/18 0535  WBC 7.7 5.1  NEUTROABS 5.5  --   HGB 15.0 13.4  HCT 46.4 42.7  MCV 86.4 87.5  PLT 179 134*   Cardiac Enzymes: Recent Labs  Lab 11/11/18 0530  TROPONINI <0.03   BNP: Invalid input(s): POCBNP CBG: Recent Labs  Lab 11/11/18 1145 11/11/18 2129 11/12/18 0723  GLUCAP 174* 102* 96   HbA1C: No results for input(s): HGBA1C in the last 72 hours. Urine analysis:    Component Value Date/Time   COLORURINE YELLOW 11/11/2018 0529   APPEARANCEUR CLEAR 11/11/2018 0529   LABSPEC 1.007 11/11/2018 0529   PHURINE 7.0 11/11/2018 0529   GLUCOSEU >=500 (A) 11/11/2018 0529   HGBUR SMALL (A) 11/11/2018 0529   BILIRUBINUR NEGATIVE 11/11/2018 0529   KETONESUR NEGATIVE 11/11/2018 0529   PROTEINUR 30 (A) 11/11/2018 0529   UROBILINOGEN 0.2 04/10/2014 1256   NITRITE NEGATIVE 11/11/2018 0529   LEUKOCYTESUR NEGATIVE 11/11/2018 0529   Sepsis Labs: @LABRCNTIP (procalcitonin:4,lacticidven:4) )No results found for this or any previous visit (from the past 240 hour(s)).   Scheduled Meds:  enoxaparin (LOVENOX) injection   40 mg Subcutaneous Q24H   insulin aspart  0-9 Units Subcutaneous TID WC   pantoprazole  40 mg Oral Daily   rosuvastatin  40 mg Oral Daily   Continuous Infusions:  sodium chloride 75 mL/hr at 11/12/18 7893    Procedures/Studies: Ct Abdomen Pelvis Wo Contrast  Result Date: 11/11/2018 CLINICAL DATA:  Lower abdominal pain, shortness of breath, chest pain EXAM: CT CHEST, ABDOMEN AND PELVIS WITHOUT CONTRAST TECHNIQUE: Multidetector CT imaging of the chest, abdomen and pelvis was performed following the standard protocol without IV contrast. Oral enteric contrast was administered. COMPARISON:  CT abdomen pelvis, 12/09/2013 FINDINGS: CT CHEST FINDINGS Cardiovascular: Three-vessel coronary artery calcifications. Status post aortic valve replacement. Normal heart size. No pericardial effusion. Mediastinum/Nodes: No enlarged mediastinal, hilar, or axillary lymph nodes. Thyroid gland, trachea, and esophagus demonstrate no significant findings. Lungs/Pleura: Small, chronic appearing bilateral pleural effusions. Biapical pleuroparenchymal scarring. Bibasilar scarring and/or atelectasis. Musculoskeletal: No chest wall mass or suspicious bone lesions identified. CT ABDOMEN PELVIS FINDINGS Hepatobiliary: There is a retracted appearing region of the central right lobe of the liver with a hypodense internal lesion measuring approximately 2.7 by 1.5 cm (series 3, image 42). Hypodense lesion is not noted on prior examination  dated 12/09/2013. Small gallstones in the gallbladder. No gallbladder wall thickening, or biliary dilatation. There is a small calcification in the pancreatic head, in the general vicinity of the distal common bile duct (series 3, image 61, series 6, image 61). Pancreas: Generally atrophic appearance of the pancreatic parenchyma with prominence of the main pancreatic duct. Spleen: Normal in size without focal abnormality. Adrenals/Urinary Tract: Adrenal glands are unremarkable. Kidneys are normal,  without renal calculi, focal lesion, or hydronephrosis. Bladder is unremarkable. Stomach/Bowel: Stomach is within normal limits. No evidence of bowel wall thickening, distention, or inflammatory changes. Vascular/Lymphatic: Calcific atherosclerosis. No enlarged abdominal or pelvic lymph nodes. Reproductive: No mass or other abnormality. Other: No abdominal wall hernia or abnormality. No abdominopelvic ascites. Musculoskeletal: No acute or significant osseous findings. Status post left hip total arthroplasty. Dense streak artifact from arthroplasty somewhat limits evaluation of the low pelvis. IMPRESSION: 1. Small, chronic appearing bilateral pleural effusions. Biapical pleuroparenchymal scarring. Bibasilar scarring and/or atelectasis. No acute CT findings of the chest. 2.  Coronary artery disease.  Status post aortic valve replacement. 3. Cholelithiasis. There is a small calcification in the pancreatic head, in the general vicinity of the distal common bile duct (series 3, image 61, series 6, image 61). This is new compared to prior examination and may reflect a choledochal left. This may be further evaluated by MRI/MRCP if indicated by clinical signs and symptoms. There is no gross biliary ductal dilatation. 4. Generally atrophic appearance of the pancreatic parenchyma with prominence of the main pancreatic duct, ductal prominence new compared to prior examination and of uncertain significance. As above, this may be further evaluated by MRI/MRCP. 5. There is a retracted appearing region of the central right lobe of the liver with a hypodense internal lesion measuring approximately 2.7 by 1.5 cm (series 3, image 42). Hypodense lesion is not noted on prior examination dated 24/58/0998 and is of uncertain significance. As above, this may be further evaluated by contrast enhanced multiphasic MRI if desired. 6.  Other chronic and incidental findings as detailed above. Electronically Signed   By: Eddie Candle M.D.   On:  11/11/2018 13:58   Ct Chest Wo Contrast  Result Date: 11/11/2018 CLINICAL DATA:  Lower abdominal pain, shortness of breath, chest pain EXAM: CT CHEST, ABDOMEN AND PELVIS WITHOUT CONTRAST TECHNIQUE: Multidetector CT imaging of the chest, abdomen and pelvis was performed following the standard protocol without IV contrast. Oral enteric contrast was administered. COMPARISON:  CT abdomen pelvis, 12/09/2013 FINDINGS: CT CHEST FINDINGS Cardiovascular: Three-vessel coronary artery calcifications. Status post aortic valve replacement. Normal heart size. No pericardial effusion. Mediastinum/Nodes: No enlarged mediastinal, hilar, or axillary lymph nodes. Thyroid gland, trachea, and esophagus demonstrate no significant findings. Lungs/Pleura: Small, chronic appearing bilateral pleural effusions. Biapical pleuroparenchymal scarring. Bibasilar scarring and/or atelectasis. Musculoskeletal: No chest wall mass or suspicious bone lesions identified. CT ABDOMEN PELVIS FINDINGS Hepatobiliary: There is a retracted appearing region of the central right lobe of the liver with a hypodense internal lesion measuring approximately 2.7 by 1.5 cm (series 3, image 42). Hypodense lesion is not noted on prior examination dated 12/09/2013. Small gallstones in the gallbladder. No gallbladder wall thickening, or biliary dilatation. There is a small calcification in the pancreatic head, in the general vicinity of the distal common bile duct (series 3, image 61, series 6, image 61). Pancreas: Generally atrophic appearance of the pancreatic parenchyma with prominence of the main pancreatic duct. Spleen: Normal in size without focal abnormality. Adrenals/Urinary Tract: Adrenal glands are unremarkable. Kidneys are normal,  without renal calculi, focal lesion, or hydronephrosis. Bladder is unremarkable. Stomach/Bowel: Stomach is within normal limits. No evidence of bowel wall thickening, distention, or inflammatory changes. Vascular/Lymphatic: Calcific  atherosclerosis. No enlarged abdominal or pelvic lymph nodes. Reproductive: No mass or other abnormality. Other: No abdominal wall hernia or abnormality. No abdominopelvic ascites. Musculoskeletal: No acute or significant osseous findings. Status post left hip total arthroplasty. Dense streak artifact from arthroplasty somewhat limits evaluation of the low pelvis. IMPRESSION: 1. Small, chronic appearing bilateral pleural effusions. Biapical pleuroparenchymal scarring. Bibasilar scarring and/or atelectasis. No acute CT findings of the chest. 2.  Coronary artery disease.  Status post aortic valve replacement. 3. Cholelithiasis. There is a small calcification in the pancreatic head, in the general vicinity of the distal common bile duct (series 3, image 61, series 6, image 61). This is new compared to prior examination and may reflect a choledochal left. This may be further evaluated by MRI/MRCP if indicated by clinical signs and symptoms. There is no gross biliary ductal dilatation. 4. Generally atrophic appearance of the pancreatic parenchyma with prominence of the main pancreatic duct, ductal prominence new compared to prior examination and of uncertain significance. As above, this may be further evaluated by MRI/MRCP. 5. There is a retracted appearing region of the central right lobe of the liver with a hypodense internal lesion measuring approximately 2.7 by 1.5 cm (series 3, image 42). Hypodense lesion is not noted on prior examination dated 02/03/1600 and is of uncertain significance. As above, this may be further evaluated by contrast enhanced multiphasic MRI if desired. 6.  Other chronic and incidental findings as detailed above. Electronically Signed   By: Eddie Candle M.D.   On: 11/11/2018 13:58   Mr 3d Recon At Scanner  Result Date: 11/11/2018 CLINICAL DATA:  Question distal common bile duct stone on recent CT scan. EXAM: MRI ABDOMEN WITHOUT AND WITH CONTRAST (INCLUDING MRCP) TECHNIQUE: Multiplanar  multisequence MR imaging of the abdomen was performed both before and after the administration of intravenous contrast. Heavily T2-weighted images of the biliary and pancreatic ducts were obtained, and three-dimensional MRCP images were rendered by post processing. CONTRAST:  6 cc Gadavist COMPARISON:  CT scan 11/11/2018 FINDINGS: Lower chest: Probable atelectasis left base with tiny pleural effusions. 14 mm lesion medial right lower lobe better visualized on previous CT. Hepatobiliary: Nodular liver contour is associated with asymmetric enlargement of the lateral segment left liver, morphologic features suggesting underlying cirrhosis. 6.3 x 4.5 cm region of ill-defined high arterial phase hyper enhancement is identified in segment IV, corresponding to the abnormality seen on CT scan earlier today. There is a 1.6 x 2.9 cm central area of hypoenhancement within this lesion. Tiny gallstones evident. MRCP imaging is substantially motion degraded and nondiagnostic. No definite substantial extrahepatic biliary duct dilatation. Pancreas: Pancreatic parenchyma is diffusely atrophic with diffuse dilatation of the main pancreatic duct, measuring up to 6 mm diameter. Although study is motion degraded, no discrete mass lesion is identified in the head of the pancreas. No differential enhancement in the head of pancreas can be discerned. Spleen:  Unremarkable. Adrenals/Urinary Tract: No adrenal nodule or mass. Prominent extrarenal pelvis noted in each kidney. Small subcapsular cyst noted upper pole right kidney. Stomach/Bowel: Stomach is unremarkable. No gastric wall thickening. No evidence of outlet obstruction. Duodenum is normally positioned as is the ligament of Treitz. No small bowel or colonic dilatation within the visualized abdomen. Vascular/Lymphatic: No abdominal aortic aneurysm. Portal vein is patent. There is no gastrohepatic or hepatoduodenal ligament lymphadenopathy.  No intraperitoneal or retroperitoneal  lymphadenopathy. Other:  No substantial intraperitoneal free fluid. Musculoskeletal: No abnormal marrow enhancement within the visualized bony anatomy. IMPRESSION: 1. Markedly motion degraded study. 2. Nodular liver contour is associated with enlargement of the lateral segment left liver, features suggesting cirrhosis. Abnormality on CT scan earlier today shows early ill-defined hyper enhancement involving segment 4 suggesting ill-defined lesion measuring up to 6.3 x 4.5 cm. Potential central focus of differentially increased washout. Lesion difficult to assess given motion degradation on postcontrast imaging but infiltrative HCC cannot be excluded. 3. Cholelithiasis. 4. MRCP portion of this exam is nondiagnostic. 5. Diffuse pancreatic atrophy with diffuse dilatation of the main pancreatic duct measuring up to 6 mm. No discrete mass lesion identified in the head of pancreas although assessment is limited by motion artifact. 6. Tiny bilateral pleural effusions with probable dependent atelectasis in the left lower lobe in a more focal rounded 14 mm lesion medial right lower lobe, better visualized on CT scan earlier today. Electronically Signed   By: Misty Stanley M.D.   On: 11/11/2018 18:45   Mr Abdomen With Mrcp W Contrast  Result Date: 11/11/2018 CLINICAL DATA:  Question distal common bile duct stone on recent CT scan. EXAM: MRI ABDOMEN WITHOUT AND WITH CONTRAST (INCLUDING MRCP) TECHNIQUE: Multiplanar multisequence MR imaging of the abdomen was performed both before and after the administration of intravenous contrast. Heavily T2-weighted images of the biliary and pancreatic ducts were obtained, and three-dimensional MRCP images were rendered by post processing. CONTRAST:  6 cc Gadavist COMPARISON:  CT scan 11/11/2018 FINDINGS: Lower chest: Probable atelectasis left base with tiny pleural effusions. 14 mm lesion medial right lower lobe better visualized on previous CT. Hepatobiliary: Nodular liver contour is  associated with asymmetric enlargement of the lateral segment left liver, morphologic features suggesting underlying cirrhosis. 6.3 x 4.5 cm region of ill-defined high arterial phase hyper enhancement is identified in segment IV, corresponding to the abnormality seen on CT scan earlier today. There is a 1.6 x 2.9 cm central area of hypoenhancement within this lesion. Tiny gallstones evident. MRCP imaging is substantially motion degraded and nondiagnostic. No definite substantial extrahepatic biliary duct dilatation. Pancreas: Pancreatic parenchyma is diffusely atrophic with diffuse dilatation of the main pancreatic duct, measuring up to 6 mm diameter. Although study is motion degraded, no discrete mass lesion is identified in the head of the pancreas. No differential enhancement in the head of pancreas can be discerned. Spleen:  Unremarkable. Adrenals/Urinary Tract: No adrenal nodule or mass. Prominent extrarenal pelvis noted in each kidney. Small subcapsular cyst noted upper pole right kidney. Stomach/Bowel: Stomach is unremarkable. No gastric wall thickening. No evidence of outlet obstruction. Duodenum is normally positioned as is the ligament of Treitz. No small bowel or colonic dilatation within the visualized abdomen. Vascular/Lymphatic: No abdominal aortic aneurysm. Portal vein is patent. There is no gastrohepatic or hepatoduodenal ligament lymphadenopathy. No intraperitoneal or retroperitoneal lymphadenopathy. Other:  No substantial intraperitoneal free fluid. Musculoskeletal: No abnormal marrow enhancement within the visualized bony anatomy. IMPRESSION: 1. Markedly motion degraded study. 2. Nodular liver contour is associated with enlargement of the lateral segment left liver, features suggesting cirrhosis. Abnormality on CT scan earlier today shows early ill-defined hyper enhancement involving segment 4 suggesting ill-defined lesion measuring up to 6.3 x 4.5 cm. Potential central focus of differentially  increased washout. Lesion difficult to assess given motion degradation on postcontrast imaging but infiltrative HCC cannot be excluded. 3. Cholelithiasis. 4. MRCP portion of this exam is nondiagnostic. 5. Diffuse pancreatic atrophy  with diffuse dilatation of the main pancreatic duct measuring up to 6 mm. No discrete mass lesion identified in the head of pancreas although assessment is limited by motion artifact. 6. Tiny bilateral pleural effusions with probable dependent atelectasis in the left lower lobe in a more focal rounded 14 mm lesion medial right lower lobe, better visualized on CT scan earlier today. Electronically Signed   By: Misty Stanley M.D.   On: 11/11/2018 18:45    Orson Eva, DO  [facility] Pager [phone]  If 7PM-7AM, please contact night-coverage [email or web address] Password [email or web address] 11/12/2018, 8:51 AM   LOS: 1 day

## 2018-11-12 NOTE — Progress Notes (Signed)
[facility]  Called and updated his wife [name]. Let her know about the pancreatitis. Discussed the [place] and the workup with plans for GI to weigh in.   Curlene Labrum, MD [facility] [address] [place], [place] [address] [phone] (office)

## 2018-11-12 NOTE — Consult Note (Signed)
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 Spleen:  Unremarkable. Adrenals/Urinary Tract: No adrenal nodule or mass. Prominent extrarenal pelvis noted in each kidney. Small subcapsular cyst noted upper pole right kidney. Stomach/Bowel: Stomach is unremarkable. No gastric wall thickening. No evidence of outlet obstruction. Duodenum is normally positioned as is the ligament of Treitz. No small bowel or colonic dilatation within the visualized abdomen. Vascular/Lymphatic: No abdominal aortic aneurysm. Portal vein is patent. There is no gastrohepatic or hepatoduodenal ligament lymphadenopathy. No intraperitoneal or retroperitoneal lymphadenopathy. Other:  No substantial intraperitoneal free fluid. Musculoskeletal: No abnormal marrow enhancement within the visualized bony anatomy. IMPRESSION: 1. Markedly motion degraded study. 2. Nodular liver contour is associated with enlargement of the lateral segment left liver, features suggesting cirrhosis. Abnormality on CT scan earlier today shows early ill-defined hyper enhancement involving segment 4 suggesting ill-defined  lesion measuring up to 6.3 x 4.5 cm. Potential central focus of differentially increased washout. Lesion difficult to assess given motion degradation on postcontrast imaging but infiltrative HCC cannot be excluded. 3. Cholelithiasis. 4. MRCP portion of this exam is nondiagnostic. 5. 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 No discrete mass lesion identified in the head of pancreas although assessment is limited by motion artifact. 6. Tiny bilateral pleural effusions with probable dependent atelectasis in the left lower lobe in a more focal rounded 14 mm lesion medial right lower lobe, better visualized on CT scan earlier today. Electronically Signed   By: Misty Stanley M.D.   On: 11/11/2018 18:45  [4 week]   Impression: Very pleasant 78 year old gentleman who presented with several week history of upper abdominal discomfort associated with nausea and dry heaves, weight loss.  On presentation noted to have pancreatitis, etiology yet to be determined.  No substantial biliary dilatation noted and studies have been limited with regards to evaluation for choledocholithiasis given motion degradation of MRCP.  LFT abnormalities noted with mild elevation of transaminases and markedly elevated alkaline phosphatase with normal bilirubin, pattern atypical for biliary obstruction and may have more to do with significant liver lesion noted.  Liver lesion has not been adequately evaluated given motion degradation and potentially could represent an Elida.  Patient has diffuse pancreatic atrophy with dilatation of the main pancreatic duct  without discrete mass.  Clinically pancreatitis appears to be resolving and patient is feeling better.  Plan: 1. We will follow-up on pending right upper quadrant ultrasound which was ordered to further evaluate CBD.   2. Follow-up pending labs specifically AFP.  If significantly elevated may consider consultation with IR as  next step.  If unremarkable he may need to have endoscopic ultrasound as next step to evaluate common bile duct, liver lesion, pancreas in more detail. 3. Supportive of measures for pancreatitis.  Clinically he looks better today.  Will advance diet.  We would like to thank you for the opportunity to participate in the care of AMIRI RIECHERS.  Please note, we have seen patient today while covering for Dr. Laural Golden.    Laureen Ochs. Bernarda Caffey [facility] [phone] 4/8/20203:19 PM      LOS: 1 day

## 2018-11-12 NOTE — Consult Note (Signed)
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 today. There is a 1.6 x 2.9 cm central area of hypoenhancement within this lesion. Tiny gallstones evident. MRCP imaging is substantially motion degraded and nondiagnostic. No definite substantial extrahepatic biliary duct dilatation. Pancreas: Pancreatic parenchyma is diffusely atrophic with diffuse dilatation of the main pancreatic duct, measuring up to 6 mm diameter. Although study is motion degraded, no discrete mass lesion is identified in the head of the pancreas. No differential enhancement in the head of pancreas can be discerned. Spleen:  Unremarkable. Adrenals/Urinary Tract: No adrenal nodule or mass. Prominent extrarenal pelvis noted in each kidney. Small subcapsular cyst noted upper pole right kidney. Stomach/Bowel: Stomach is unremarkable. No gastric wall thickening. No evidence of outlet obstruction. Duodenum is normally positioned as is the ligament of Treitz. No small bowel or colonic dilatation within the visualized abdomen. Vascular/Lymphatic: No abdominal aortic aneurysm. Portal vein is patent. There is no gastrohepatic  or hepatoduodenal ligament lymphadenopathy. No intraperitoneal or retroperitoneal lymphadenopathy. Other:  No substantial intraperitoneal free fluid. Musculoskeletal: No abnormal marrow enhancement within the visualized bony anatomy. IMPRESSION: 1. Markedly motion degraded study. 2. Nodular liver contour is associated with enlargement of the lateral segment left liver, features suggesting cirrhosis. Abnormality on CT scan earlier today shows early ill-defined hyper enhancement involving segment 4 suggesting ill-defined lesion measuring up to 6.3 x 4.5 cm. Potential central focus of differentially increased washout. Lesion difficult to assess given motion degradation on postcontrast imaging but infiltrative HCC cannot be excluded. 3. Cholelithiasis. 4. MRCP portion of this exam is nondiagnostic. 5. Diffuse pancreatic atrophy with diffuse dilatation of the main pancreatic duct measuring up to 6 mm. No discrete mass lesion identified in the head of pancreas although assessment is limited by motion artifact. 6. Tiny bilateral pleural effusions with probable dependent atelectasis in the left lower lobe in a more focal rounded 14 mm lesion medial right lower lobe, better visualized on CT scan earlier today. Electronically Signed   By: Misty Stanley M.D.   On: 11/11/2018 18:45     Assessment & Plan:  [name] is a 78 y.o. male with pancreatitis with a dilated pancreatic duct and atrophic pancreas. He does have gallstones but the biliary system is not dilated and he has no T bili elevation. His dilation is only in the pancreatic duct which makes me question if this is really something from a gallstone. I am not sure what the calcification around the pancreatic head could be / if it is a stone.  I would just think that a gallstone would also cause some biliary dilation and rise in the T bili.   He also has been started back on metformin and some of his symptoms coincide with this and could have contributed to his  symptoms +/- pancreatitis.  The liver lesion is also concerning for possible HCC.    - Will discuss with Dr. Laural Golden, I am unsure if we are really dealing with a  gallstone pancreatitis or if this could be something causing the dilation at the ampulla?  - Ordered an [place] to assess the CBD further but this will have to be tomorrow AM due to patient not being NPO  - Dr. Laural Golden can weigh in on liver lesion but this needs biopsy given the appearance and concern  - Tried to call talked to patient's spouse, [name], at his request.  Voicemail left.   All questions were answered to the satisfaction of the patient.  Greater than 50% of the 50 minute visit was spent in counseling/ coordination of care regarding the patient's pancreatitis and concern for possible gallstone pancreatitis.   Virl Cagey 11/12/2018, 1:28 PM

## 2018-11-13 ENCOUNTER — Telehealth: Payer: Self-pay | Admitting: Gastroenterology

## 2018-11-13 ENCOUNTER — Inpatient Hospital Stay (HOSPITAL_COMMUNITY): Payer: Medicare HMO

## 2018-11-13 DIAGNOSIS — K8020 Calculus of gallbladder without cholecystitis without obstruction: Secondary | ICD-10-CM

## 2018-11-13 DIAGNOSIS — K8689 Other specified diseases of pancreas: Secondary | ICD-10-CM

## 2018-11-13 DIAGNOSIS — K769 Liver disease, unspecified: Secondary | ICD-10-CM

## 2018-11-13 DIAGNOSIS — Z794 Long term (current) use of insulin: Secondary | ICD-10-CM

## 2018-11-13 DIAGNOSIS — E119 Type 2 diabetes mellitus without complications: Secondary | ICD-10-CM

## 2018-11-13 DIAGNOSIS — K859 Acute pancreatitis without necrosis or infection, unspecified: Secondary | ICD-10-CM

## 2018-11-13 DIAGNOSIS — C220 Liver cell carcinoma: Secondary | ICD-10-CM

## 2018-11-13 LAB — MITOCHONDRIAL ANTIBODIES: Mitochondrial M2 Ab, IgG: <20 Units (ref 0.0–20.0)

## 2018-11-13 LAB — COMPREHENSIVE METABOLIC PANEL
ALT: 47 U/L — ABNORMAL HIGH (ref 0–44)
AST: 35 U/L (ref 15–41)
Albumin: 3.1 g/dL — ABNORMAL LOW (ref 3.5–5.0)
Alkaline Phosphatase: 392 U/L — ABNORMAL HIGH (ref 38–126)
Anion gap: 9 (ref 5–15)
BUN: 8 mg/dL (ref 8–23)
CO2: 22 mmol/L (ref 22–32)
Calcium: 8.7 mg/dL — ABNORMAL LOW (ref 8.9–10.3)
Chloride: 108 mmol/L (ref 98–111)
Creatinine, Ser: 0.9 mg/dL (ref 0.61–1.24)
GFR calc Af Amer: >60 mL/min (ref >60)
GFR calc non Af Amer: >60 mL/min (ref >60)
Glucose, Bld: 108 mg/dL — ABNORMAL HIGH (ref 70–99)
Potassium: 3.4 mmol/L — ABNORMAL LOW (ref 3.5–5.1)
Sodium: 139 mmol/L (ref 135–145)
Total Bilirubin: 1 mg/dL (ref 0.3–1.2)
Total Protein: 6.2 g/dL — ABNORMAL LOW (ref 6.5–8.1)

## 2018-11-13 LAB — LIPID PANEL
Cholesterol: 105 mg/dL (ref 0–200)
HDL: 25 mg/dL — ABNORMAL LOW (ref >40)
LDL Cholesterol: 56 mg/dL (ref 0–99)
Total CHOL/HDL Ratio: 4.2 RATIO
Triglycerides: 122 mg/dL (ref <150)
VLDL: 24 mg/dL (ref 0–40)

## 2018-11-13 LAB — GLUCOSE, CAPILLARY
Glucose-Capillary: 100 mg/dL — ABNORMAL HIGH (ref 70–99)
Glucose-Capillary: 100 mg/dL — ABNORMAL HIGH (ref 70–99)
Glucose-Capillary: 169 mg/dL — ABNORMAL HIGH (ref 70–99)
Glucose-Capillary: 145 mg/dL — ABNORMAL HIGH (ref 70–99)

## 2018-11-13 LAB — AFP TUMOR MARKER: AFP, Serum, Tumor Marker: 1451 ng/mL — ABNORMAL HIGH (ref 0.0–8.3)

## 2018-11-13 LAB — HEPATITIS C ANTIBODY: HCV Ab: <0.1 s/co ratio (ref 0.0–0.9)

## 2018-11-13 LAB — ANTI-SMOOTH MUSCLE ANTIBODY, IGG: F-Actin IgG: 6 Units (ref 0–19)

## 2018-11-13 LAB — LIPASE, BLOOD: Lipase: 37 U/L (ref 11–51)

## 2018-11-13 LAB — HEPATITIS B SURFACE ANTIGEN: Hepatitis B Surface Ag: NEGATIVE

## 2018-11-13 NOTE — Telephone Encounter (Signed)
Please see if Dr. Ardis Hughs can help with EUS in 3-4 weeks to assess pancreaticobiliary tree for idiopathic pancreatitis, dilated pancreatic duct (choledocholithiasis could not be excluded on CT/MRCP/U/S) in the setting of liver tumor that is being worked up.
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
## 2018-11-13 NOTE — Progress Notes (Addendum)
[facility] Progress Note     Subjective: Tolerating diet. Pain resolved. No nausea. [place] this Am with CBD 65mm, but could not fully assess duct so nondiagnostic per report.    AFP elevated consistent with [place] on imaging. GI has seen patient and recommended oncology and has discussed [place] with IR.  GI also recommending EUS for pancreatic duct dilation.   Objective: Vital signs in last 24 hours: Temp:  [97.9 F (36.6 C)-98.4 F (36.9 C)] 97.9 F (36.6 C) (04/09 1523) Pulse Rate:  [88-106] 106 (04/09 1523) Resp:  [16-19] 19 (04/09 1523) BP: (136-142)/(69-76) 138/72 (04/09 1523) SpO2:  [98 %-100 %] 99 % (04/09 1523) Last BM Date: 11/10/18  Intake/Output from previous day: 04/08 0701 - 04/09 0700 In: 2306 [P.O.:720; I.V.:1586] Out: 2600 [Urine:2600] Intake/Output this shift: Total I/O In: 928.9 [P.O.:290; I.V.:638.9] Out: 400 [Urine:400]  General appearance: alert, cooperative and no distress Resp: normal work of breathing GI: soft, non-tender; bowel sounds normal; no masses,  no organomegaly  Lab Results:  Recent Labs    11/11/18 0530 11/12/18 0535  WBC 7.7 5.1  HGB 15.0 13.4  HCT 46.4 42.7  PLT 179 134*   BMET Recent Labs    11/12/18 0535 11/13/18 0624  NA 138 139  K 3.8 3.4*  CL 107 108  CO2 24 22  GLUCOSE 115* 108*  BUN 10 8  CREATININE 1.01 0.90  CALCIUM 9.0 8.7*   Reviewed [place]- cholelithiasis but no choledocholithiasis, CBD wnl  Studies/Results: Mr 3d Recon At Scanner  Result Date: 11/11/2018 CLINICAL DATA:  Question distal common bile duct stone on recent CT scan. EXAM: MRI ABDOMEN WITHOUT AND WITH CONTRAST (INCLUDING MRCP) TECHNIQUE: Multiplanar multisequence MR imaging of the abdomen was performed both before and after the administration of intravenous contrast. Heavily T2-weighted images of the biliary and pancreatic ducts were obtained, and three-dimensional MRCP images were rendered by post processing. CONTRAST:  6 cc Gadavist  COMPARISON:  CT scan 11/11/2018 FINDINGS: Lower chest: Probable atelectasis left base with tiny pleural effusions. 14 mm lesion medial right lower lobe better visualized on previous CT. Hepatobiliary: Nodular liver contour is associated with asymmetric enlargement of the lateral segment left liver, morphologic features suggesting underlying cirrhosis. 6.3 x 4.5 cm region of ill-defined high arterial phase hyper enhancement is identified in segment IV, corresponding to the abnormality seen on CT scan earlier today. There is a 1.6 x 2.9 cm central area of hypoenhancement within this lesion. Tiny gallstones evident. MRCP imaging is substantially motion degraded and nondiagnostic. No definite substantial extrahepatic biliary duct dilatation. Pancreas: Pancreatic parenchyma is diffusely atrophic with diffuse dilatation of the main pancreatic duct, measuring up to 6 mm diameter. Although study is motion degraded, no discrete mass lesion is identified in the head of the pancreas. No differential enhancement in the head of pancreas can be discerned. Spleen:  Unremarkable. Adrenals/Urinary Tract: No adrenal nodule or mass. Prominent extrarenal pelvis noted in each kidney. Small subcapsular cyst noted upper pole right kidney. Stomach/Bowel: Stomach is unremarkable. No gastric wall thickening. No evidence of outlet obstruction. Duodenum is normally positioned as is the ligament of Treitz. No small bowel or colonic dilatation within the visualized abdomen. Vascular/Lymphatic: No abdominal aortic aneurysm. Portal vein is patent. There is no gastrohepatic or hepatoduodenal ligament lymphadenopathy. No intraperitoneal or retroperitoneal lymphadenopathy. Other:  No substantial intraperitoneal free fluid. Musculoskeletal: No abnormal marrow enhancement within the visualized bony anatomy. IMPRESSION: 1. Markedly motion degraded study. 2. Nodular liver contour is associated with enlargement of  the lateral segment left liver, features  suggesting cirrhosis. Abnormality on CT scan earlier today shows early ill-defined hyper enhancement involving segment 4 suggesting ill-defined lesion measuring up to 6.3 x 4.5 cm. Potential central focus of differentially increased washout. Lesion difficult to assess given motion degradation on postcontrast imaging but infiltrative HCC cannot be excluded. 3. Cholelithiasis. 4. MRCP portion of this exam is nondiagnostic. 5. Diffuse pancreatic atrophy with diffuse dilatation of the main pancreatic duct measuring up to 6 mm. No discrete mass lesion identified in the head of pancreas although assessment is limited by motion artifact. 6. Tiny bilateral pleural effusions with probable dependent atelectasis in the left lower lobe in a more focal rounded 14 mm lesion medial right lower lobe, better visualized on CT scan earlier today. Electronically Signed   By: Misty Stanley M.D.   On: 11/11/2018 18:45   Mr Abdomen With Mrcp W Contrast  Result Date: 11/11/2018 CLINICAL DATA:  Question distal common bile duct stone on recent CT scan. EXAM: MRI ABDOMEN WITHOUT AND WITH CONTRAST (INCLUDING MRCP) TECHNIQUE: Multiplanar multisequence MR imaging of the abdomen was performed both before and after the administration of intravenous contrast. Heavily T2-weighted images of the biliary and pancreatic ducts were obtained, and three-dimensional MRCP images were rendered by post processing. CONTRAST:  6 cc Gadavist COMPARISON:  CT scan 11/11/2018 FINDINGS: Lower chest: Probable atelectasis left base with tiny pleural effusions. 14 mm lesion medial right lower lobe better visualized on previous CT. Hepatobiliary: Nodular liver contour is associated with asymmetric enlargement of the lateral segment left liver, morphologic features suggesting underlying cirrhosis. 6.3 x 4.5 cm region of ill-defined high arterial phase hyper enhancement is identified in segment IV, corresponding to the abnormality seen on CT scan earlier today. There is  a 1.6 x 2.9 cm central area of hypoenhancement within this lesion. Tiny gallstones evident. MRCP imaging is substantially motion degraded and nondiagnostic. No definite substantial extrahepatic biliary duct dilatation. Pancreas: Pancreatic parenchyma is diffusely atrophic with diffuse dilatation of the main pancreatic duct, measuring up to 6 mm diameter. Although study is motion degraded, no discrete mass lesion is identified in the head of the pancreas. No differential enhancement in the head of pancreas can be discerned. Spleen:  Unremarkable. Adrenals/Urinary Tract: No adrenal nodule or mass. Prominent extrarenal pelvis noted in each kidney. Small subcapsular cyst noted upper pole right kidney. Stomach/Bowel: Stomach is unremarkable. No gastric wall thickening. No evidence of outlet obstruction. Duodenum is normally positioned as is the ligament of Treitz. No small bowel or colonic dilatation within the visualized abdomen. Vascular/Lymphatic: No abdominal aortic aneurysm. Portal vein is patent. There is no gastrohepatic or hepatoduodenal ligament lymphadenopathy. No intraperitoneal or retroperitoneal lymphadenopathy. Other:  No substantial intraperitoneal free fluid. Musculoskeletal: No abnormal marrow enhancement within the visualized bony anatomy. IMPRESSION: 1. Markedly motion degraded study. 2. Nodular liver contour is associated with enlargement of the lateral segment left liver, features suggesting cirrhosis. Abnormality on CT scan earlier today shows early ill-defined hyper enhancement involving segment 4 suggesting ill-defined lesion measuring up to 6.3 x 4.5 cm. Potential central focus of differentially increased washout. Lesion difficult to assess given motion degradation on postcontrast imaging but infiltrative HCC cannot be excluded. 3. Cholelithiasis. 4. MRCP portion of this exam is nondiagnostic. 5. Diffuse pancreatic atrophy with diffuse dilatation of the main pancreatic duct measuring up to 6 mm.  No discrete mass lesion identified in the head of pancreas although assessment is limited by motion artifact. 6. Tiny bilateral pleural effusions with probable  dependent atelectasis in the left lower lobe in a more focal rounded 14 mm lesion medial right lower lobe, better visualized on CT scan earlier today. Electronically Signed   By: Misty Stanley M.D.   On: 11/11/2018 18:45   US Abdomen Limited Ruq  Result Date: 11/13/2018 CLINICAL DATA:  78 year old male with a history of choledocholithiasis EXAM: ULTRASOUND ABDOMEN LIMITED RIGHT UPPER QUADRANT COMPARISON:  MR 11/11/2018, CT 11/11/2018 FINDINGS: Gallbladder: Negative sonographic Murphy's sign. Echogenic debris layered dependently within the gallbladder lumen. No gallbladder wall thickening. No pericholecystic fluid. 6 mm echogenic focus at the neck of the gallbladder. Common bile duct measures 4 mm-5 mm. Common bile duct: Diameter: 4 mm-5 mm Liver: The mass at the dome of the liver is not visualized on the current ultrasound survey. Heterogeneous echotexture of liver parenchyma, with nodular surface. Portal vein is patent on color Doppler imaging with normal direction of blood flow towards the liver. IMPRESSION: Cholelithiasis without sonographic evidence of acute cholecystitis. Ultrasound is nondiagnostic for the presence of choledocholithiasis. Cirrhotic morphology of the liver. The infiltrative mass at the dome of the liver not visualized on the current ultrasound. Electronically Signed   By: Corrie Mckusick D.O.   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   Patient is being appropriately worked up and there are plans for oncology to see him.  Agree with GI for EUS in the upcoming weeks to evaluate the pancreatic duct/ ampulla/ biliary system Agree with oncology for [facility] and discussion of treatment versus need for any biopsy   No surgical intervention for the gallbladder needed at this time  Follow up with GI, Oncology and PCP and if EUS demonstrates concern for any choledocholithiasis can be referred back to surgery at that time to discuss options in the setting of [facility]   Discussed with Dr. Carles Collet.    LOS: 2 days    [name] 11/13/2018

## 2018-11-13 NOTE — Telephone Encounter (Signed)
Referral placed.

## 2018-11-13 NOTE — Progress Notes (Signed)
PROGRESS NOTE  [name] IWL:[id number] DOB: 23-Jul-1941 DOA: 11/11/2018 PCP: Susy Frizzle, MD  Brief History:  78 year old male with history of aortic stenosis, coronary disease, diabetes mellitus, hypertension, hyperlipidemia presenting with approximate 2-week history of anorexia and epigastric abdominal pain.  The patient endorses some nausea and poor oral intake and generalized weakness, but denies any vomiting, diarrhea, fevers, chills.  He states that in the past month he has been started on metformin and rosuvastatin by his primary care provider.However, in the past 3 days the patient describes some dry heaving episodes.  As result, the patient presented for further evaluation.  The patient had stated that eating meals would occasionally make his abdominal pain worse, but occasionally it would help his pain as well.  CT of the abdomen and pelvis showed a right central lower lobe retracted appearing region with hypodense internal lesions.  There was also noted a small calcification the pancreas head in the general vicinity of the distal common bile duct.  MRCP was obtained and showed a nodular liver contour associated with enlargement of the lateral left liver suggesting cirrhosis.  Because of the patient's movement, the study was degraded.  There was diffuse pancreatic atrophy with diffuse dilatation of the main pancreatic duct measuring up to 6 mm.  There was early ill-defined hyper enhancement involving segment 4 suggesting ill-defined lesion measuring up to 6.3 x 4.5 cm. Potential central focus of differentially increased washout. Lesion difficult to assess given motion degradation on postcontrast imaging but infiltrative HCC cannot be excluded.  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 Gala Romney  Transaminasemia -hep B surface antigen--neg -hep C antibody--neg -AMA--pending -ASMA--pending -likely due to liver mass  Acute kidney injury -Secondary to volume depletion -Baseline creatinine 0.7-0.9 -Serum creatinine peaked 1.30 -Continue IV fluids-->improved  Essential hypertension -Continue metoprolol succinate  Diabetes mellitus type 2, uncontrolled with hyperglycemia -Continue NovoLog sliding scale -Holding home dose insulin glargine due to poor p.o. intake -08/19/18 A1C--8.6 -CBGs controlled  Hyperlipidemia -Continue statin  Aortic stenosis -Status post aortic valve replacement with bioprosthetic valve      Disposition Plan:   Home pending med/onc recommendations  Family Communication:  Daughter updated on phone 4/9  Consultants:  GI, general surgery, medical oncology  Code Status:  FULL   DVT Prophylaxis:   Shannon Lovenox   Procedures: As Listed in Progress Note Above  Antibiotics: None   Subjective: Pt is feeling better.  He states abd pain is improved.  He denies f/c, cp, sob, n/v/d, cough, hematochezia  Objective: Vitals:   11/12/18 1359 11/12/18 2131 11/12/18 2344 11/13/18 0653  BP: 126/68 136/76  (!) 142/69  Pulse: 88 97  88  Resp: 18 18  16   Temp: 98.3 F (36.8 C) 98.4 F (36.9 C)  98 F (36.7 C)  TempSrc: Oral Oral  Oral  SpO2:  100% 99% 98%  Weight:      Height:  Intake/Output Summary (Last 24  hours) at 11/13/2018 1139 Last data filed at 11/13/2018 0900 Gross per 24 hour  Intake 2115.96 ml  Output 2100 ml  Net 15.96 ml   Weight change:  Exam:   General:  Pt is alert, follows commands appropriately, not in acute distress  HEENT: No icterus, No thrush, No neck mass, [place]/AT  Cardiovascular: RRR, S1/S2, no rubs, no gallops  Respiratory: CTA bilaterally, no wheezing, no crackles, no rhonchi  Abdomen: Soft/+BS, non tender, non distended, no guarding  Extremities: No edema, No lymphangitis, No petechiae, No rashes, no synovitis   Data Reviewed: I have personally reviewed following labs and imaging studies Basic Metabolic Panel: Recent Labs  Lab 11/11/18 0530 11/12/18 0535 11/13/18 0624  NA 134* 138 139  K 4.3 3.8 3.4*  CL 97* 107 108  CO2 27 24 22   GLUCOSE 221* 115* 108*  BUN 16 10 8   CREATININE 1.30* 1.01 0.90  CALCIUM 9.8 9.0 8.7*   Liver Function Tests: Recent Labs  Lab 11/11/18 0530 11/13/18 0624  AST 82* 35  ALT 82* 47*  ALKPHOS 536* 392*  BILITOT 1.2 1.0  PROT 7.5 6.2*  ALBUMIN 3.7 3.1*   Recent Labs  Lab 11/11/18 0600 11/12/18 0535 11/13/18 0624  LIPASE 1,405* 56* 37   No results for input(s): AMMONIA in the last 168 hours. Coagulation Profile: No results for input(s): INR, PROTIME in the last 168 hours. CBC: Recent Labs  Lab 11/11/18 0530 11/12/18 0535  WBC 7.7 5.1  NEUTROABS 5.5  --   HGB 15.0 13.4  HCT 46.4 42.7  MCV 86.4 87.5  PLT 179 134*   Cardiac Enzymes: Recent Labs  Lab 11/11/18 0530  TROPONINI <0.03   BNP: Invalid input(s): POCBNP CBG: Recent Labs  Lab 11/12/18 1114 11/12/18 1631 11/12/18 2132 11/13/18 0755 11/13/18 1127  GLUCAP 195* 82 130* 100* 100*   HbA1C: No results for input(s): HGBA1C in the last 72 hours. Urine analysis:    Component Value Date/Time   COLORURINE YELLOW 11/11/2018 0529   APPEARANCEUR CLEAR 11/11/2018 0529   LABSPEC 1.007 11/11/2018 0529   PHURINE 7.0 11/11/2018 0529   GLUCOSEU  >=500 (A) 11/11/2018 0529   HGBUR SMALL (A) 11/11/2018 0529   BILIRUBINUR NEGATIVE 11/11/2018 0529   KETONESUR NEGATIVE 11/11/2018 0529   PROTEINUR 30 (A) 11/11/2018 0529   UROBILINOGEN 0.2 04/10/2014 1256   NITRITE NEGATIVE 11/11/2018 0529   LEUKOCYTESUR NEGATIVE 11/11/2018 0529   Sepsis Labs: @LABRCNTIP (procalcitonin:4,lacticidven:4) )No results found for this or any previous visit (from the past 240 hour(s)).   Scheduled Meds:  enoxaparin (LOVENOX) injection  40 mg Subcutaneous Q24H   insulin aspart  0-9 Units Subcutaneous TID WC   pantoprazole  40 mg Oral Daily   rosuvastatin  40 mg Oral Daily   Continuous Infusions:  sodium chloride 75 mL/hr at 11/12/18 2207    Procedures/Studies: Ct Abdomen Pelvis Wo Contrast  Result Date: 11/11/2018 CLINICAL DATA:  Lower abdominal pain, shortness of breath, chest pain EXAM: CT CHEST, ABDOMEN AND PELVIS WITHOUT CONTRAST TECHNIQUE: Multidetector CT imaging of the chest, abdomen and pelvis was performed following the standard protocol without IV contrast. Oral enteric contrast was administered. COMPARISON:  CT abdomen pelvis, 12/09/2013 FINDINGS: CT CHEST FINDINGS Cardiovascular: Three-vessel coronary artery calcifications. Status post aortic valve replacement. Normal heart size. No pericardial effusion. Mediastinum/Nodes: No enlarged mediastinal, hilar, or axillary lymph nodes. Thyroid gland, trachea, and esophagus demonstrate no significant findings. Lungs/Pleura: Small, chronic appearing bilateral pleural effusions. Biapical pleuroparenchymal scarring. Bibasilar scarring  and/or atelectasis. Musculoskeletal: No chest wall mass or suspicious bone lesions identified. CT ABDOMEN PELVIS FINDINGS Hepatobiliary: There is a retracted appearing region of the central right lobe of the liver with a hypodense internal lesion measuring approximately 2.7 by 1.5 cm (series 3, image 42). Hypodense lesion is not noted on prior examination dated 12/09/2013. Small  gallstones in the gallbladder. No gallbladder wall thickening, or biliary dilatation. There is a small calcification in the pancreatic head, in the general vicinity of the distal common bile duct (series 3, image 61, series 6, image 61). Pancreas: Generally atrophic appearance of the pancreatic parenchyma with prominence of the main pancreatic duct. Spleen: Normal in size without focal abnormality. Adrenals/Urinary Tract: Adrenal glands are unremarkable. Kidneys are normal, without renal calculi, focal lesion, or hydronephrosis. Bladder is unremarkable. Stomach/Bowel: Stomach is within normal limits. No evidence of bowel wall thickening, distention, or inflammatory changes. Vascular/Lymphatic: Calcific atherosclerosis. No enlarged abdominal or pelvic lymph nodes. Reproductive: No mass or other abnormality. Other: No abdominal wall hernia or abnormality. No abdominopelvic ascites. Musculoskeletal: No acute or significant osseous findings. Status post left hip total arthroplasty. Dense streak artifact from arthroplasty somewhat limits evaluation of the low pelvis. IMPRESSION: 1. Small, chronic appearing bilateral pleural effusions. Biapical pleuroparenchymal scarring. Bibasilar scarring and/or atelectasis. No acute CT findings of the chest. 2.  Coronary artery disease.  Status post aortic valve replacement. 3. Cholelithiasis. There is a small calcification in the pancreatic head, in the general vicinity of the distal common bile duct (series 3, image 61, series 6, image 61). This is new compared to prior examination and may reflect a choledochal left. This may be further evaluated by MRI/MRCP if indicated by clinical signs and symptoms. There is no gross biliary ductal dilatation. 4. Generally atrophic appearance of the pancreatic parenchyma with prominence of the main pancreatic duct, ductal prominence new compared to prior examination and of uncertain significance. As above, this may be further evaluated by  MRI/MRCP. 5. There is a retracted appearing region of the central right lobe of the liver with a hypodense internal lesion measuring approximately 2.7 by 1.5 cm (series 3, image 42). Hypodense lesion is not noted on prior examination dated 27/74/1287 and is of uncertain significance. As above, this may be further evaluated by contrast enhanced multiphasic MRI if desired. 6.  Other chronic and incidental findings as detailed above. Electronically Signed   By: Eddie Candle M.D.   On: 11/11/2018 13:58   Ct Chest Wo Contrast  Result Date: 11/11/2018 CLINICAL DATA:  Lower abdominal pain, shortness of breath, chest pain EXAM: CT CHEST, ABDOMEN AND PELVIS WITHOUT CONTRAST TECHNIQUE: Multidetector CT imaging of the chest, abdomen and pelvis was performed following the standard protocol without IV contrast. Oral enteric contrast was administered. COMPARISON:  CT abdomen pelvis, 12/09/2013 FINDINGS: CT CHEST FINDINGS Cardiovascular: Three-vessel coronary artery calcifications. Status post aortic valve replacement. Normal heart size. No pericardial effusion. Mediastinum/Nodes: No enlarged mediastinal, hilar, or axillary lymph nodes. Thyroid gland, trachea, and esophagus demonstrate no significant findings. Lungs/Pleura: Small, chronic appearing bilateral pleural effusions. Biapical pleuroparenchymal scarring. Bibasilar scarring and/or atelectasis. Musculoskeletal: No chest wall mass or suspicious bone lesions identified. CT ABDOMEN PELVIS FINDINGS Hepatobiliary: There is a retracted appearing region of the central right lobe of the liver with a hypodense internal lesion measuring approximately 2.7 by 1.5 cm (series 3, image 42). Hypodense lesion is not noted on prior examination dated 12/09/2013. Small gallstones in the gallbladder. No gallbladder wall thickening, or biliary dilatation. There is a small  calcification in the pancreatic head, in the general vicinity of the distal common bile duct (series 3, image 61, series  6, image 61). Pancreas: Generally atrophic appearance of the pancreatic parenchyma with prominence of the main pancreatic duct. Spleen: Normal in size without focal abnormality. Adrenals/Urinary Tract: Adrenal glands are unremarkable. Kidneys are normal, without renal calculi, focal lesion, or hydronephrosis. Bladder is unremarkable. Stomach/Bowel: Stomach is within normal limits. No evidence of bowel wall thickening, distention, or inflammatory changes. Vascular/Lymphatic: Calcific atherosclerosis. No enlarged abdominal or pelvic lymph nodes. Reproductive: No mass or other abnormality. Other: No abdominal wall hernia or abnormality. No abdominopelvic ascites. Musculoskeletal: No acute or significant osseous findings. Status post left hip total arthroplasty. Dense streak artifact from arthroplasty somewhat limits evaluation of the low pelvis. IMPRESSION: 1. Small, chronic appearing bilateral pleural effusions. Biapical pleuroparenchymal scarring. Bibasilar scarring and/or atelectasis. No acute CT findings of the chest. 2.  Coronary artery disease.  Status post aortic valve replacement. 3. Cholelithiasis. There is a small calcification in the pancreatic head, in the general vicinity of the distal common bile duct (series 3, image 61, series 6, image 61). This is new compared to prior examination and may reflect a choledochal left. This may be further evaluated by MRI/MRCP if indicated by clinical signs and symptoms. There is no gross biliary ductal dilatation. 4. Generally atrophic appearance of the pancreatic parenchyma with prominence of the main pancreatic duct, ductal prominence new compared to prior examination and of uncertain significance. As above, this may be further evaluated by MRI/MRCP. 5. There is a retracted appearing region of the central right lobe of the liver with a hypodense internal lesion measuring approximately 2.7 by 1.5 cm (series 3, image 42). Hypodense lesion is not noted on prior  examination dated 09/73/5329 and is of uncertain significance. As above, this may be further evaluated by contrast enhanced multiphasic MRI if desired. 6.  Other chronic and incidental findings as detailed above. Electronically Signed   By: Eddie Candle M.D.   On: 11/11/2018 13:58   Mr 3d Recon At Scanner  Result Date: 11/11/2018 CLINICAL DATA:  Question distal common bile duct stone on recent CT scan. EXAM: MRI ABDOMEN WITHOUT AND WITH CONTRAST (INCLUDING MRCP) TECHNIQUE: Multiplanar multisequence MR imaging of the abdomen was performed both before and after the administration of intravenous contrast. Heavily T2-weighted images of the biliary and pancreatic ducts were obtained, and three-dimensional MRCP images were rendered by post processing. CONTRAST:  6 cc Gadavist COMPARISON:  CT scan 11/11/2018 FINDINGS: Lower chest: Probable atelectasis left base with tiny pleural effusions. 14 mm lesion medial right lower lobe better visualized on previous CT. Hepatobiliary: Nodular liver contour is associated with asymmetric enlargement of the lateral segment left liver, morphologic features suggesting underlying cirrhosis. 6.3 x 4.5 cm region of ill-defined high arterial phase hyper enhancement is identified in segment IV, corresponding to the abnormality seen on CT scan earlier today. There is a 1.6 x 2.9 cm central area of hypoenhancement within this lesion. Tiny gallstones evident. MRCP imaging is substantially motion degraded and nondiagnostic. No definite substantial extrahepatic biliary duct dilatation. Pancreas: Pancreatic parenchyma is diffusely atrophic with diffuse dilatation of the main pancreatic duct, measuring up to 6 mm diameter. Although study is motion degraded, no discrete mass lesion is identified in the head of the pancreas. No differential enhancement in the head of pancreas can be discerned. Spleen:  Unremarkable. Adrenals/Urinary Tract: No adrenal nodule or mass. Prominent extrarenal pelvis noted  in each kidney. Small subcapsular cyst  noted upper pole right kidney. Stomach/Bowel: Stomach is unremarkable. No gastric wall thickening. No evidence of outlet obstruction. Duodenum is normally positioned as is the ligament of Treitz. No small bowel or colonic dilatation within the visualized abdomen. Vascular/Lymphatic: No abdominal aortic aneurysm. Portal vein is patent. There is no gastrohepatic or hepatoduodenal ligament lymphadenopathy. No intraperitoneal or retroperitoneal lymphadenopathy. Other:  No substantial intraperitoneal free fluid. Musculoskeletal: No abnormal marrow enhancement within the visualized bony anatomy. IMPRESSION: 1. Markedly motion degraded study. 2. Nodular liver contour is associated with enlargement of the lateral segment left liver, features suggesting cirrhosis. Abnormality on CT scan earlier today shows early ill-defined hyper enhancement involving segment 4 suggesting ill-defined lesion measuring up to 6.3 x 4.5 cm. Potential central focus of differentially increased washout. Lesion difficult to assess given motion degradation on postcontrast imaging but infiltrative HCC cannot be excluded. 3. Cholelithiasis. 4. MRCP portion of this exam is nondiagnostic. 5. Diffuse pancreatic atrophy with diffuse dilatation of the main pancreatic duct measuring up to 6 mm. No discrete mass lesion identified in the head of pancreas although assessment is limited by motion artifact. 6. Tiny bilateral pleural effusions with probable dependent atelectasis in the left lower lobe in a more focal rounded 14 mm lesion medial right lower lobe, better visualized on CT scan earlier today. Electronically Signed   By: Misty Stanley M.D.   On: 11/11/2018 18:45   Mr Abdomen With Mrcp W Contrast  Result Date: 11/11/2018 CLINICAL DATA:  Question distal common bile duct stone on recent CT scan. EXAM: MRI ABDOMEN WITHOUT AND WITH CONTRAST (INCLUDING MRCP) TECHNIQUE: Multiplanar multisequence MR imaging of the  abdomen was performed both before and after the administration of intravenous contrast. Heavily T2-weighted images of the biliary and pancreatic ducts were obtained, and three-dimensional MRCP images were rendered by post processing. CONTRAST:  6 cc Gadavist COMPARISON:  CT scan 11/11/2018 FINDINGS: Lower chest: Probable atelectasis left base with tiny pleural effusions. 14 mm lesion medial right lower lobe better visualized on previous CT. Hepatobiliary: Nodular liver contour is associated with asymmetric enlargement of the lateral segment left liver, morphologic features suggesting underlying cirrhosis. 6.3 x 4.5 cm region of ill-defined high arterial phase hyper enhancement is identified in segment IV, corresponding to the abnormality seen on CT scan earlier today. There is a 1.6 x 2.9 cm central area of hypoenhancement within this lesion. Tiny gallstones evident. MRCP imaging is substantially motion degraded and nondiagnostic. No definite substantial extrahepatic biliary duct dilatation. Pancreas: Pancreatic parenchyma is diffusely atrophic with diffuse dilatation of the main pancreatic duct, measuring up to 6 mm diameter. Although study is motion degraded, no discrete mass lesion is identified in the head of the pancreas. No differential enhancement in the head of pancreas can be discerned. Spleen:  Unremarkable. Adrenals/Urinary Tract: No adrenal nodule or mass. Prominent extrarenal pelvis noted in each kidney. Small subcapsular cyst noted upper pole right kidney. Stomach/Bowel: Stomach is unremarkable. No gastric wall thickening. No evidence of outlet obstruction. Duodenum is normally positioned as is the ligament of Treitz. No small bowel or colonic dilatation within the visualized abdomen. Vascular/Lymphatic: No abdominal aortic aneurysm. Portal vein is patent. There is no gastrohepatic or hepatoduodenal ligament lymphadenopathy. No intraperitoneal or retroperitoneal lymphadenopathy. Other:  No substantial  intraperitoneal free fluid. Musculoskeletal: No abnormal marrow enhancement within the visualized bony anatomy. IMPRESSION: 1. Markedly motion degraded study. 2. Nodular liver contour is associated with enlargement of the lateral segment left liver, features suggesting cirrhosis. Abnormality on CT scan earlier today shows  early ill-defined hyper enhancement involving segment 4 suggesting ill-defined lesion measuring up to 6.3 x 4.5 cm. Potential central focus of differentially increased washout. Lesion difficult to assess given motion degradation on postcontrast imaging but infiltrative HCC cannot be excluded. 3. Cholelithiasis. 4. MRCP portion of this exam is nondiagnostic. 5. Diffuse pancreatic atrophy with diffuse dilatation of the main pancreatic duct measuring up to 6 mm. No discrete mass lesion identified in the head of pancreas although assessment is limited by motion artifact. 6. Tiny bilateral pleural effusions with probable dependent atelectasis in the left lower lobe in a more focal rounded 14 mm lesion medial right lower lobe, better visualized on CT scan earlier today. Electronically Signed   By: Misty Stanley M.D.   On: 11/11/2018 18:45   US Abdomen Limited Ruq  Result Date: 11/13/2018 CLINICAL DATA:  78 year old male with a history of choledocholithiasis EXAM: ULTRASOUND ABDOMEN LIMITED RIGHT UPPER QUADRANT COMPARISON:  MR 11/11/2018, CT 11/11/2018 FINDINGS: Gallbladder: Negative sonographic Murphy's sign. Echogenic debris layered dependently within the gallbladder lumen. No gallbladder wall thickening. No pericholecystic fluid. 6 mm echogenic focus at the neck of the gallbladder. Common bile duct measures 4 mm-5 mm. Common bile duct: Diameter: 4 mm-5 mm Liver: The mass at the dome of the liver is not visualized on the current ultrasound survey. Heterogeneous echotexture of liver parenchyma, with nodular surface. Portal vein is patent on color Doppler imaging with normal direction of blood flow  towards the liver. IMPRESSION: Cholelithiasis without sonographic evidence of acute cholecystitis. Ultrasound is nondiagnostic for the presence of choledocholithiasis. Cirrhotic morphology of the liver. The infiltrative mass at the dome of the liver not visualized on the current ultrasound. Electronically Signed   By: Corrie Mckusick D.O.   On: 11/13/2018 09:11    Orson Eva, DO  [facility] Pager [phone]  If 7PM-7AM, please contact night-coverage [email or web address] Password [email or web address] 11/13/2018, 11:39 AM   LOS: 2 days

## 2018-11-13 NOTE — Addendum Note (Signed)
Addended by: Inge Rise on: 11/13/2018 01:42 PM   Modules accepted: Orders

## 2018-11-13 NOTE — Telephone Encounter (Signed)
Dr Jacobs please review  

## 2018-11-13 NOTE — Progress Notes (Signed)
Subjective:  Hungry. No abdominal pain.   Objective: Vital signs in last 24 hours: Temp:  [98 F (36.7 C)-98.4 F (36.9 C)] 98 F (36.7 C) (04/09 0653) Pulse Rate:  [88-97] 88 (04/09 0653) Resp:  [16-18] 16 (04/09 0653) BP: (126-142)/(68-76) 142/69 (04/09 0653) SpO2:  [98 %-100 %] 98 % (04/09 0653) Last BM Date: 11/10/18 General:   Alert,  Frail appearing WM, NAD Head:  Normocephalic and atraumatic. Eyes:  Sclera clear, no icterus.  Abdomen:  Soft, nontender and nondistended. Normal bowel sounds, without guarding, and without rebound.   Extremities:  Without clubbing, deformity or edema. Neurologic:  Alert and  oriented x4;  grossly normal neurologically. Skin:  Intact without significant lesions or rashes. Psych:  Alert and cooperative. Normal mood and affect.  Intake/Output from previous day: 04/08 0701 - 04/09 0700 In: 2306 [P.O.:720; I.V.:1586] Out: 2600 [Urine:2600] Intake/Output this shift: No intake/output data recorded.  Lab Results: CBC Recent Labs    11/11/18 0530 11/12/18 0535  WBC 7.7 5.1  HGB 15.0 13.4  HCT 46.4 42.7  MCV 86.4 87.5  PLT 179 134*   BMET Recent Labs    11/11/18 0530 11/12/18 0535 11/13/18 0624  NA 134* 138 139  K 4.3 3.8 3.4*  CL 97* 107 108  CO2 27 24 22   GLUCOSE 221* 115* 108*  BUN 16 10 8   CREATININE 1.30* 1.01 0.90  CALCIUM 9.8 9.0 8.7*   LFTs Recent Labs    11/11/18 0530 11/13/18 0624  BILITOT 1.2 1.0  ALKPHOS 536* 392*  AST 82* 35  ALT 82* 47*  PROT 7.5 6.2*  ALBUMIN 3.7 3.1*   Recent Labs    11/11/18 0600 11/12/18 0535 11/13/18 0624  LIPASE 1,405* 56* 37   PT/INR No results for input(s): LABPROT, INR in the last 72 hours.    Imaging Studies: Ct Abdomen Pelvis Wo Contrast  Result Date: 11/11/2018 CLINICAL DATA:  Lower abdominal pain, shortness of breath, chest pain EXAM: CT CHEST, ABDOMEN AND PELVIS WITHOUT CONTRAST TECHNIQUE: Multidetector CT imaging of the chest, abdomen and pelvis was performed  following the standard protocol without IV contrast. Oral enteric contrast was administered. COMPARISON:  CT abdomen pelvis, 12/09/2013 FINDINGS: CT CHEST FINDINGS Cardiovascular: Three-vessel coronary artery calcifications. Status post aortic valve replacement. Normal heart size. No pericardial effusion. Mediastinum/Nodes: No enlarged mediastinal, hilar, or axillary lymph nodes. Thyroid gland, trachea, and esophagus demonstrate no significant findings. Lungs/Pleura: Small, chronic appearing bilateral pleural effusions. Biapical pleuroparenchymal scarring. Bibasilar scarring and/or atelectasis. Musculoskeletal: No chest wall mass or suspicious bone lesions identified. CT ABDOMEN PELVIS FINDINGS Hepatobiliary: There is a retracted appearing region of the central right lobe of the liver with a hypodense internal lesion measuring approximately 2.7 by 1.5 cm (series 3, image 42). Hypodense lesion is not noted on prior examination dated 12/09/2013. Small gallstones in the gallbladder. No gallbladder wall thickening, or biliary dilatation. There is a small calcification in the pancreatic head, in the general vicinity of the distal common bile duct (series 3, image 61, series 6, image 61). Pancreas: Generally atrophic appearance of the pancreatic parenchyma with prominence of the main pancreatic duct. Spleen: Normal in size without focal abnormality. Adrenals/Urinary Tract: Adrenal glands are unremarkable. Kidneys are normal, without renal calculi, focal lesion, or hydronephrosis. Bladder is unremarkable. Stomach/Bowel: Stomach is within normal limits. No evidence of bowel wall thickening, distention, or inflammatory changes. Vascular/Lymphatic: Calcific atherosclerosis. No enlarged abdominal or pelvic lymph nodes. Reproductive: No mass or other abnormality. Other: No abdominal wall hernia or  abnormality. No abdominopelvic ascites. Musculoskeletal: No acute or significant osseous findings. Status post left hip total  arthroplasty. Dense streak artifact from arthroplasty somewhat limits evaluation of the low pelvis. IMPRESSION: 1. Small, chronic appearing bilateral pleural effusions. Biapical pleuroparenchymal scarring. Bibasilar scarring and/or atelectasis. No acute CT findings of the chest. 2.  Coronary artery disease.  Status post aortic valve replacement. 3. Cholelithiasis. There is a small calcification in the pancreatic head, in the general vicinity of the distal common bile duct (series 3, image 61, series 6, image 61). This is new compared to prior examination and may reflect a choledochal left. This may be further evaluated by MRI/MRCP if indicated by clinical signs and symptoms. There is no gross biliary ductal dilatation. 4. Generally atrophic appearance of the pancreatic parenchyma with prominence of the main pancreatic duct, ductal prominence new compared to prior examination and of uncertain significance. As above, this may be further evaluated by MRI/MRCP. 5. There is a retracted appearing region of the central right lobe of the liver with a hypodense internal lesion measuring approximately 2.7 by 1.5 cm (series 3, image 42). Hypodense lesion is not noted on prior examination dated 11/94/1740 and is of uncertain significance. As above, this may be further evaluated by contrast enhanced multiphasic MRI if desired. 6.  Other chronic and incidental findings as detailed above. Electronically Signed   By: Eddie Candle M.D.   On: 11/11/2018 13:58   Ct Chest Wo Contrast  Result Date: 11/11/2018 CLINICAL DATA:  Lower abdominal pain, shortness of breath, chest pain EXAM: CT CHEST, ABDOMEN AND PELVIS WITHOUT CONTRAST TECHNIQUE: Multidetector CT imaging of the chest, abdomen and pelvis was performed following the standard protocol without IV contrast. Oral enteric contrast was administered. COMPARISON:  CT abdomen pelvis, 12/09/2013 FINDINGS: CT CHEST FINDINGS Cardiovascular: Three-vessel coronary artery calcifications.  Status post aortic valve replacement. Normal heart size. No pericardial effusion. Mediastinum/Nodes: No enlarged mediastinal, hilar, or axillary lymph nodes. Thyroid gland, trachea, and esophagus demonstrate no significant findings. Lungs/Pleura: Small, chronic appearing bilateral pleural effusions. Biapical pleuroparenchymal scarring. Bibasilar scarring and/or atelectasis. Musculoskeletal: No chest wall mass or suspicious bone lesions identified. CT ABDOMEN PELVIS FINDINGS Hepatobiliary: There is a retracted appearing region of the central right lobe of the liver with a hypodense internal lesion measuring approximately 2.7 by 1.5 cm (series 3, image 42). Hypodense lesion is not noted on prior examination dated 12/09/2013. Small gallstones in the gallbladder. No gallbladder wall thickening, or biliary dilatation. There is a small calcification in the pancreatic head, in the general vicinity of the distal common bile duct (series 3, image 61, series 6, image 61). Pancreas: Generally atrophic appearance of the pancreatic parenchyma with prominence of the main pancreatic duct. Spleen: Normal in size without focal abnormality. Adrenals/Urinary Tract: Adrenal glands are unremarkable. Kidneys are normal, without renal calculi, focal lesion, or hydronephrosis. Bladder is unremarkable. Stomach/Bowel: Stomach is within normal limits. No evidence of bowel wall thickening, distention, or inflammatory changes. Vascular/Lymphatic: Calcific atherosclerosis. No enlarged abdominal or pelvic lymph nodes. Reproductive: No mass or other abnormality. Other: No abdominal wall hernia or abnormality. No abdominopelvic ascites. Musculoskeletal: No acute or significant osseous findings. Status post left hip total arthroplasty. Dense streak artifact from arthroplasty somewhat limits evaluation of the low pelvis. IMPRESSION: 1. Small, chronic appearing bilateral pleural effusions. Biapical pleuroparenchymal scarring. Bibasilar scarring and/or  atelectasis. No acute CT findings of the chest. 2.  Coronary artery disease.  Status post aortic valve replacement. 3. Cholelithiasis. There is a small calcification in the  pancreatic head, in the general vicinity of the distal common bile duct (series 3, image 61, series 6, image 61). This is new compared to prior examination and may reflect a choledochal left. This may be further evaluated by MRI/MRCP if indicated by clinical signs and symptoms. There is no gross biliary ductal dilatation. 4. Generally atrophic appearance of the pancreatic parenchyma with prominence of the main pancreatic duct, ductal prominence new compared to prior examination and of uncertain significance. As above, this may be further evaluated by MRI/MRCP. 5. There is a retracted appearing region of the central right lobe of the liver with a hypodense internal lesion measuring approximately 2.7 by 1.5 cm (series 3, image 42). Hypodense lesion is not noted on prior examination dated 85/09/7739 and is of uncertain significance. As above, this may be further evaluated by contrast enhanced multiphasic MRI if desired. 6.  Other chronic and incidental findings as detailed above. Electronically Signed   By: Eddie Candle M.D.   On: 11/11/2018 13:58   Mr 3d Recon At Scanner  Result Date: 11/11/2018 CLINICAL DATA:  Question distal common bile duct stone on recent CT scan. EXAM: MRI ABDOMEN WITHOUT AND WITH CONTRAST (INCLUDING MRCP) TECHNIQUE: Multiplanar multisequence MR imaging of the abdomen was performed both before and after the administration of intravenous contrast. Heavily T2-weighted images of the biliary and pancreatic ducts were obtained, and three-dimensional MRCP images were rendered by post processing. CONTRAST:  6 cc Gadavist COMPARISON:  CT scan 11/11/2018 FINDINGS: Lower chest: Probable atelectasis left base with tiny pleural effusions. 14 mm lesion medial right lower lobe better visualized on previous CT. Hepatobiliary: Nodular  liver contour is associated with asymmetric enlargement of the lateral segment left liver, morphologic features suggesting underlying cirrhosis. 6.3 x 4.5 cm region of ill-defined high arterial phase hyper enhancement is identified in segment IV, corresponding to the abnormality seen on CT scan earlier today. There is a 1.6 x 2.9 cm central area of hypoenhancement within this lesion. Tiny gallstones evident. MRCP imaging is substantially motion degraded and nondiagnostic. No definite substantial extrahepatic biliary duct dilatation. Pancreas: Pancreatic parenchyma is diffusely atrophic with diffuse dilatation of the main pancreatic duct, measuring up to 6 mm diameter. Although study is motion degraded, no discrete mass lesion is identified in the head of the pancreas. No differential enhancement in the head of pancreas can be discerned. Spleen:  Unremarkable. Adrenals/Urinary Tract: No adrenal nodule or mass. Prominent extrarenal pelvis noted in each kidney. Small subcapsular cyst noted upper pole right kidney. Stomach/Bowel: Stomach is unremarkable. No gastric wall thickening. No evidence of outlet obstruction. Duodenum is normally positioned as is the ligament of Treitz. No small bowel or colonic dilatation within the visualized abdomen. Vascular/Lymphatic: No abdominal aortic aneurysm. Portal vein is patent. There is no gastrohepatic or hepatoduodenal ligament lymphadenopathy. No intraperitoneal or retroperitoneal lymphadenopathy. Other:  No substantial intraperitoneal free fluid. Musculoskeletal: No abnormal marrow enhancement within the visualized bony anatomy. IMPRESSION: 1. Markedly motion degraded study. 2. Nodular liver contour is associated with enlargement of the lateral segment left liver, features suggesting cirrhosis. Abnormality on CT scan earlier today shows early ill-defined hyper enhancement involving segment 4 suggesting ill-defined lesion measuring up to 6.3 x 4.5 cm. Potential central focus of  differentially increased washout. Lesion difficult to assess given motion degradation on postcontrast imaging but infiltrative HCC cannot be excluded. 3. Cholelithiasis. 4. MRCP portion of this exam is nondiagnostic. 5. Diffuse pancreatic atrophy with diffuse dilatation of the main pancreatic duct measuring up to 6 mm.  No discrete mass lesion identified in the head of pancreas although assessment is limited by motion artifact. 6. Tiny bilateral pleural effusions with probable dependent atelectasis in the left lower lobe in a more focal rounded 14 mm lesion medial right lower lobe, better visualized on CT scan earlier today. Electronically Signed   By: Misty Stanley M.D.   On: 11/11/2018 18:45   Mr Abdomen With Mrcp W Contrast  Result Date: 11/11/2018 CLINICAL DATA:  Question distal common bile duct stone on recent CT scan. EXAM: MRI ABDOMEN WITHOUT AND WITH CONTRAST (INCLUDING MRCP) TECHNIQUE: Multiplanar multisequence MR imaging of the abdomen was performed both before and after the administration of intravenous contrast. Heavily T2-weighted images of the biliary and pancreatic ducts were obtained, and three-dimensional MRCP images were rendered by post processing. CONTRAST:  6 cc Gadavist COMPARISON:  CT scan 11/11/2018 FINDINGS: Lower chest: Probable atelectasis left base with tiny pleural effusions. 14 mm lesion medial right lower lobe better visualized on previous CT. Hepatobiliary: Nodular liver contour is associated with asymmetric enlargement of the lateral segment left liver, morphologic features suggesting underlying cirrhosis. 6.3 x 4.5 cm region of ill-defined high arterial phase hyper enhancement is identified in segment IV, corresponding to the abnormality seen on CT scan earlier today. There is a 1.6 x 2.9 cm central area of hypoenhancement within this lesion. Tiny gallstones evident. MRCP imaging is substantially motion degraded and nondiagnostic. No definite substantial extrahepatic biliary duct  dilatation. Pancreas: Pancreatic parenchyma is diffusely atrophic with diffuse dilatation of the main pancreatic duct, measuring up to 6 mm diameter. Although study is motion degraded, no discrete mass lesion is identified in the head of the pancreas. No differential enhancement in the head of pancreas can be discerned. Spleen:  Unremarkable. Adrenals/Urinary Tract: No adrenal nodule or mass. Prominent extrarenal pelvis noted in each kidney. Small subcapsular cyst noted upper pole right kidney. Stomach/Bowel: Stomach is unremarkable. No gastric wall thickening. No evidence of outlet obstruction. Duodenum is normally positioned as is the ligament of Treitz. No small bowel or colonic dilatation within the visualized abdomen. Vascular/Lymphatic: No abdominal aortic aneurysm. Portal vein is patent. There is no gastrohepatic or hepatoduodenal ligament lymphadenopathy. No intraperitoneal or retroperitoneal lymphadenopathy. Other:  No substantial intraperitoneal free fluid. Musculoskeletal: No abnormal marrow enhancement within the visualized bony anatomy. IMPRESSION: 1. Markedly motion degraded study. 2. Nodular liver contour is associated with enlargement of the lateral segment left liver, features suggesting cirrhosis. Abnormality on CT scan earlier today shows early ill-defined hyper enhancement involving segment 4 suggesting ill-defined lesion measuring up to 6.3 x 4.5 cm. Potential central focus of differentially increased washout. Lesion difficult to assess given motion degradation on postcontrast imaging but infiltrative HCC cannot be excluded. 3. Cholelithiasis. 4. MRCP portion of this exam is nondiagnostic. 5. Diffuse pancreatic atrophy with diffuse dilatation of the main pancreatic duct measuring up to 6 mm. No discrete mass lesion identified in the head of pancreas although assessment is limited by motion artifact. 6. Tiny bilateral pleural effusions with probable dependent atelectasis in the left lower lobe in  a more focal rounded 14 mm lesion medial right lower lobe, better visualized on CT scan earlier today. Electronically Signed   By: Misty Stanley M.D.   On: 11/11/2018 18:45  [2 weeks]   Assessment: Pleasant 78 year old gentleman presenting with several week history of upper abdominal pain associated with nausea, dry heaves, weight loss.  Noted to have acute pancreatitis on admission, etiology yet to be determined.  Appears to have chronic  pancreatitis based on imaging.  Does not clearly have choledocholithiasis but study is limited due to motion degradation on MRCP.  Ultrasound this morning pending.  Patient also has new diagnosis of cirrhosis with suspicious segment 4 liver lesion.  Diffuse pancreatic atrophy with dilatation of the main pancreatic duct without discrete mass.  LFTs improved.  Alkaline phosphatase down to 392.  Bilirubin, AST normal, ALT minimally elevated.  Hepatitis B and C markers negative.  Liver lesion with markedly elevated AFP of 1451.  Plan: 1. F/u pending u/s. Consider low fat diet if no acute cholecystitis.  2. F/u pending u/s. Given elevated AFP, consider IR consultation. Bx vs ablation if meets criteria.   Laureen Ochs. Bernarda Caffey [facility] [phone] 4/9/20209:26 AM     LOS: 2 days

## 2018-11-13 NOTE — Telephone Encounter (Signed)
There is an urgent referral in the queue for this pt:  Please see if Dr. Ardis Hughs can help with EUS in 3-4 weeks to assess pancreaticobiliary tree for idiopathic pancreatitis, dilated pancreatic duct (choledocholithiasis could not be excluded on CT/MRCP/U/S) in the setting of liver tumor that is being worked up.

## 2018-11-14 LAB — GLUCOSE, CAPILLARY
Glucose-Capillary: 97 mg/dL (ref 70–99)
Glucose-Capillary: 141 mg/dL — ABNORMAL HIGH (ref 70–99)

## 2018-11-14 MED ORDER — POTASSIUM CHLORIDE CRYS ER 20 MEQ PO TBCR
20.0000 meq | EXTENDED_RELEASE_TABLET | Freq: Once | ORAL | Status: AC
  Administered 2018-11-14: 20 meq via ORAL
  Filled 2018-11-14: qty 1

## 2018-11-14 MED ORDER — METOPROLOL SUCCINATE ER 50 MG PO TB24
50.0000 mg | ORAL_TABLET | Freq: Every day | ORAL | Status: DC
  Administered 2018-11-14: 12:00:00 50 mg via ORAL
  Filled 2018-11-14: qty 1

## 2018-11-14 NOTE — Discharge Summary (Signed)
Physician Discharge Summary  [name] BDZ:[id number] DOB: 1940-12-15 DOA: 11/11/2018  PCP: Susy Frizzle, MD  Admit date: 11/11/2018 Discharge date: 11/14/2018  Admitted From: Home Disposition:  Home   Recommendations for Outpatient Follow-up:  1. Follow up with PCP in 1-2 weeks 2. Please obtain BMP/CBC in one week   Discharge Condition: Stable CODE STATUS: FULL Diet recommendation:  Regular   Brief/Interim Summary: 78 year old male with history of aortic stenosis, coronary disease, diabetes mellitus, hypertension, hyperlipidemia presenting with approximate 2-week history of anorexia and epigastric abdominal pain. The patient endorses some nausea and poor oral intake and generalized weakness, but denies any vomiting, diarrhea, fevers, chills. He states that in the past month he has been started on metformin and rosuvastatin by his primary care provider.However, in the past 3 days the patient describes some dry heaving episodes.As result, the patient presented for further evaluation. The patient had stated that eating meals would occasionally make his abdominal pain worse, but occasionally it would help his pain as well. CT of the abdomen and pelvis showed a right central lower lobe retracted appearing region with hypodense internal lesions. There was also noted a small calcification the pancreas head in the general vicinity of the distal common bile duct. MRCP was obtained and showed a nodular liver contour associated with enlargement of the lateral left liver suggesting cirrhosis. Because of the patient's movement, the study was degraded. There was diffuse pancreatic atrophy with diffuse dilatation of the main pancreatic duct measuring up to 6 mm. There wasearly ill-defined hyper enhancement involving segment 4 suggesting ill-defined lesion measuring up to 6.3 x 4.5 cm. Potential central focus of differentially increased washout. Lesion difficult to assess given motion  degradation on postcontrast imaging but infiltrative HCC cannot be excluded.  alpha-fetoprotein--1451 Medical oncology was consulted.  They felt that given the specificity of the imaging findings in combination with significantly elevated AFP, that pursuit of tissue diagnosis was not needed.  The case was discussed with Dr. 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 Gala Romney -case discussed with Dr. Irving Copas up as outpt in 1 week -tried to arrange appointment with [facility] x 2 as they were closed on 4/10 and closed at 1430 on 4/9 -left message for them to contact patient on 11/17/18 and also gave daughter case manager's contact info if there is difficulty setting up follow up  Transaminasemia -hep B surface antigen--neg -hep C antibody--neg -AMA--pending -ASMA--pending -likely due to liver mass  Acute kidney injury -Secondary to volume depletion -Baseline creatinine 0.7-0.9 -Serum creatinine peaked 1.30 -Continue IV fluids-->improved -serum creatinine 0.90 at time of d/c  Essential  hypertension -Continue metoprolol succinate  Diabetes mellitus type 2, uncontrolled with hyperglycemia -Continue NovoLog sliding scale -Holding home dose insulin glargine due to poor p.o. intake -08/19/18 A1C--8.6 -CBGs controlled  Hyperlipidemia -Continue statin  Aortic stenosis -Status post aortic valve replacement with bioprosthetic valve    Discharge Instructions   Allergies as of 11/14/2018      Reactions   Actos [pioglitazone] Other (See Comments)   Cramps in legs   Metformin And Related Diarrhea   Morphine    REACTION: hives   Statins    REACTION: leg cramps but tolerates pravastatin      Medication List    STOP taking these medications   metFORMIN 500 MG tablet Commonly known as:  GLUCOPHAGE     TAKE these medications   aspirin 81 MG EC tablet Take 1 tablet (81 mg total) by mouth daily. Swallow whole.   Basaglar KwikPen 100 UNIT/ML Sopn Inject 0.22 mLs (22 Units total) into the skin daily. Give number of units as directed at office visit, up to 30 units daily. What changed:    how much to take  additional instructions   fish oil-omega-3 fatty acids 1000 MG capsule Take 1 g by mouth daily.   glucose blood test strip Commonly known as:  ONE TOUCH ULTRA TEST CHECK BLOOD SUGAR TWICE DAILY.PLEASE DISPENSE BASED ON INSURANCE PREFERENCE ICD 10 E11.9,Z79.4   lisinopril 2.5 MG tablet Commonly known as:  PRINIVIL,ZESTRIL Take 1 tablet (2.5 mg total) by mouth daily.   meclizine 12.5 MG tablet Commonly known as:  ANTIVERT Take 1 tablet (12.5 mg total) by mouth 3 (three) times daily as needed for dizziness.   metoprolol succinate 50 MG 24 hr tablet Commonly known as:  TOPROL-XL TAKE 1 TABLET DAILY WITH ORIMMEDIATELY FOLLOWING A    MEAL   omeprazole 20 MG capsule Commonly known as:  PRILOSEC Take 1 capsule (20 mg total) by mouth daily.   ondansetron 4 MG tablet Commonly known as:  Zofran Take 1 tablet (4 mg total) by mouth every 8 (eight) hours  as needed for nausea or vomiting.   rosuvastatin 40 MG tablet Commonly known as:  Crestor Take 1 tablet (40 mg total) by mouth daily.   Vitamin D 50 MCG (2000 UT) tablet Take 1 tablet (2,000 Units total) by mouth daily.      Follow-up Information    Derek Jack, MD In 1 week.   Specialty:  Hematology Contact information: [place] [address] [phone]          Allergies  Allergen Reactions   Actos [Pioglitazone] Other (See Comments)    Cramps in legs    Metformin And Related Diarrhea   Morphine     REACTION: hives   Statins     REACTION: leg cramps but tolerates pravastatin    Consultations:  Medical oncology  [facility]   Procedures/Studies: Ct Abdomen Pelvis Wo Contrast  Result Date: 11/11/2018 CLINICAL DATA:  Lower abdominal pain, shortness of breath, chest pain EXAM: CT CHEST, ABDOMEN AND PELVIS WITHOUT CONTRAST TECHNIQUE: Multidetector CT imaging of the chest, abdomen and pelvis was performed following the standard protocol without IV contrast. Oral enteric contrast was administered. COMPARISON:  CT abdomen pelvis, 12/09/2013 FINDINGS: CT CHEST FINDINGS Cardiovascular: Three-vessel coronary artery calcifications. Status post aortic valve replacement. Normal heart size. No pericardial effusion. Mediastinum/Nodes: No enlarged mediastinal, hilar, or axillary lymph nodes. Thyroid gland, trachea, and esophagus demonstrate no significant findings. Lungs/Pleura: Small, chronic appearing bilateral pleural effusions. Biapical pleuroparenchymal scarring. Bibasilar scarring and/or atelectasis. Musculoskeletal: No chest wall mass or suspicious bone lesions identified. CT ABDOMEN PELVIS FINDINGS Hepatobiliary: There is a retracted appearing region of the central right lobe of the liver with  a hypodense internal lesion measuring approximately 2.7 by 1.5 cm (series 3, image 42). Hypodense lesion is not noted on prior examination dated 12/09/2013.  Small gallstones in the gallbladder. No gallbladder wall thickening, or biliary dilatation. There is a small calcification in the pancreatic head, in the general vicinity of the distal common bile duct (series 3, image 61, series 6, image 61). Pancreas: Generally atrophic appearance of the pancreatic parenchyma with prominence of the main pancreatic duct. Spleen: Normal in size without focal abnormality. Adrenals/Urinary Tract: Adrenal glands are unremarkable. Kidneys are normal, without renal calculi, focal lesion, or hydronephrosis. Bladder is unremarkable. Stomach/Bowel: Stomach is within normal limits. No evidence of bowel wall thickening, distention, or inflammatory changes. Vascular/Lymphatic: Calcific atherosclerosis. No enlarged abdominal or pelvic lymph nodes. Reproductive: No mass or other abnormality. Other: No abdominal wall hernia or abnormality. No abdominopelvic ascites. Musculoskeletal: No acute or significant osseous findings. Status post left hip total arthroplasty. Dense streak artifact from arthroplasty somewhat limits evaluation of the low pelvis. IMPRESSION: 1. Small, chronic appearing bilateral pleural effusions. Biapical pleuroparenchymal scarring. Bibasilar scarring and/or atelectasis. No acute CT findings of the chest. 2.  Coronary artery disease.  Status post aortic valve replacement. 3. Cholelithiasis. There is a small calcification in the pancreatic head, in the general vicinity of the distal common bile duct (series 3, image 61, series 6, image 61). This is new compared to prior examination and may reflect a choledochal left. This may be further evaluated by MRI/MRCP if indicated by clinical signs and symptoms. There is no gross biliary ductal dilatation. 4. Generally atrophic appearance of the pancreatic parenchyma with prominence of the main pancreatic duct, ductal prominence new compared to prior examination and of uncertain significance. As above, this may be further evaluated by  MRI/MRCP. 5. There is a retracted appearing region of the central right lobe of the liver with a hypodense internal lesion measuring approximately 2.7 by 1.5 cm (series 3, image 42). Hypodense lesion is not noted on prior examination dated 01/75/1025 and is of uncertain significance. As above, this may be further evaluated by contrast enhanced multiphasic MRI if desired. 6.  Other chronic and incidental findings as detailed above. Electronically Signed   By: Eddie Candle M.D.   On: 11/11/2018 13:58   Ct Chest Wo Contrast  Result Date: 11/11/2018 CLINICAL DATA:  Lower abdominal pain, shortness of breath, chest pain EXAM: CT CHEST, ABDOMEN AND PELVIS WITHOUT CONTRAST TECHNIQUE: Multidetector CT imaging of the chest, abdomen and pelvis was performed following the standard protocol without IV contrast. Oral enteric contrast was administered. COMPARISON:  CT abdomen pelvis, 12/09/2013 FINDINGS: CT CHEST FINDINGS Cardiovascular: Three-vessel coronary artery calcifications. Status post aortic valve replacement. Normal heart size. No pericardial effusion. Mediastinum/Nodes: No enlarged mediastinal, hilar, or axillary lymph nodes. Thyroid gland, trachea, and esophagus demonstrate no significant findings. Lungs/Pleura: Small, chronic appearing bilateral pleural effusions. Biapical pleuroparenchymal scarring. Bibasilar scarring and/or atelectasis. Musculoskeletal: No chest wall mass or suspicious bone lesions identified. CT ABDOMEN PELVIS FINDINGS Hepatobiliary: There is a retracted appearing region of the central right lobe of the liver with a hypodense internal lesion measuring approximately 2.7 by 1.5 cm (series 3, image 42). Hypodense lesion is not noted on prior examination dated 12/09/2013. Small gallstones in the gallbladder. No gallbladder wall thickening, or biliary dilatation. There is a small calcification in the pancreatic head, in the general vicinity of the distal common bile duct (series 3, image 61, series  6, image 61). Pancreas: Generally atrophic appearance of the pancreatic parenchyma  with prominence of the main pancreatic duct. Spleen: Normal in size without focal abnormality. Adrenals/Urinary Tract: Adrenal glands are unremarkable. Kidneys are normal, without renal calculi, focal lesion, or hydronephrosis. Bladder is unremarkable. Stomach/Bowel: Stomach is within normal limits. No evidence of bowel wall thickening, distention, or inflammatory changes. Vascular/Lymphatic: Calcific atherosclerosis. No enlarged abdominal or pelvic lymph nodes. Reproductive: No mass or other abnormality. Other: No abdominal wall hernia or abnormality. No abdominopelvic ascites. Musculoskeletal: No acute or significant osseous findings. Status post left hip total arthroplasty. Dense streak artifact from arthroplasty somewhat limits evaluation of the low pelvis. IMPRESSION: 1. Small, chronic appearing bilateral pleural effusions. Biapical pleuroparenchymal scarring. Bibasilar scarring and/or atelectasis. No acute CT findings of the chest. 2.  Coronary artery disease.  Status post aortic valve replacement. 3. Cholelithiasis. There is a small calcification in the pancreatic head, in the general vicinity of the distal common bile duct (series 3, image 61, series 6, image 61). This is new compared to prior examination and may reflect a choledochal left. This may be further evaluated by MRI/MRCP if indicated by clinical signs and symptoms. There is no gross biliary ductal dilatation. 4. Generally atrophic appearance of the pancreatic parenchyma with prominence of the main pancreatic duct, ductal prominence new compared to prior examination and of uncertain significance. As above, this may be further evaluated by MRI/MRCP. 5. There is a retracted appearing region of the central right lobe of the liver with a hypodense internal lesion measuring approximately 2.7 by 1.5 cm (series 3, image 42). Hypodense lesion is not noted on prior  examination dated 61/60/7371 and is of uncertain significance. As above, this may be further evaluated by contrast enhanced multiphasic MRI if desired. 6.  Other chronic and incidental findings as detailed above. Electronically Signed   By: Eddie Candle M.D.   On: 11/11/2018 13:58   Mr 3d Recon At Scanner  Result Date: 11/11/2018 CLINICAL DATA:  Question distal common bile duct stone on recent CT scan. EXAM: MRI ABDOMEN WITHOUT AND WITH CONTRAST (INCLUDING MRCP) TECHNIQUE: Multiplanar multisequence MR imaging of the abdomen was performed both before and after the administration of intravenous contrast. Heavily T2-weighted images of the biliary and pancreatic ducts were obtained, and three-dimensional MRCP images were rendered by post processing. CONTRAST:  6 cc Gadavist COMPARISON:  CT scan 11/11/2018 FINDINGS: Lower chest: Probable atelectasis left base with tiny pleural effusions. 14 mm lesion medial right lower lobe better visualized on previous CT. Hepatobiliary: Nodular liver contour is associated with asymmetric enlargement of the lateral segment left liver, morphologic features suggesting underlying cirrhosis. 6.3 x 4.5 cm region of ill-defined high arterial phase hyper enhancement is identified in segment IV, corresponding to the abnormality seen on CT scan earlier today. There is a 1.6 x 2.9 cm central area of hypoenhancement within this lesion. Tiny gallstones evident. MRCP imaging is substantially motion degraded and nondiagnostic. No definite substantial extrahepatic biliary duct dilatation. Pancreas: Pancreatic parenchyma is diffusely atrophic with diffuse dilatation of the main pancreatic duct, measuring up to 6 mm diameter. Although study is motion degraded, no discrete mass lesion is identified in the head of the pancreas. No differential enhancement in the head of pancreas can be discerned. Spleen:  Unremarkable. Adrenals/Urinary Tract: No adrenal nodule or mass. Prominent extrarenal pelvis noted  in each kidney. Small subcapsular cyst noted upper pole right kidney. Stomach/Bowel: Stomach is unremarkable. No gastric wall thickening. No evidence of outlet obstruction. Duodenum is normally positioned as is the ligament of Treitz. No small bowel or  colonic dilatation within the visualized abdomen. Vascular/Lymphatic: No abdominal aortic aneurysm. Portal vein is patent. There is no gastrohepatic or hepatoduodenal ligament lymphadenopathy. No intraperitoneal or retroperitoneal lymphadenopathy. Other:  No substantial intraperitoneal free fluid. Musculoskeletal: No abnormal marrow enhancement within the visualized bony anatomy. IMPRESSION: 1. Markedly motion degraded study. 2. Nodular liver contour is associated with enlargement of the lateral segment left liver, features suggesting cirrhosis. Abnormality on CT scan earlier today shows early ill-defined hyper enhancement involving segment 4 suggesting ill-defined lesion measuring up to 6.3 x 4.5 cm. Potential central focus of differentially increased washout. Lesion difficult to assess given motion degradation on postcontrast imaging but infiltrative HCC cannot be excluded. 3. Cholelithiasis. 4. MRCP portion of this exam is nondiagnostic. 5. Diffuse pancreatic atrophy with diffuse dilatation of the main pancreatic duct measuring up to 6 mm. No discrete mass lesion identified in the head of pancreas although assessment is limited by motion artifact. 6. Tiny bilateral pleural effusions with probable dependent atelectasis in the left lower lobe in a more focal rounded 14 mm lesion medial right lower lobe, better visualized on CT scan earlier today. Electronically Signed   By: Misty Stanley M.D.   On: 11/11/2018 18:45   Mr Abdomen With Mrcp W Contrast  Result Date: 11/11/2018 CLINICAL DATA:  Question distal common bile duct stone on recent CT scan. EXAM: MRI ABDOMEN WITHOUT AND WITH CONTRAST (INCLUDING MRCP) TECHNIQUE: Multiplanar multisequence MR imaging of the  abdomen was performed both before and after the administration of intravenous contrast. Heavily T2-weighted images of the biliary and pancreatic ducts were obtained, and three-dimensional MRCP images were rendered by post processing. CONTRAST:  6 cc Gadavist COMPARISON:  CT scan 11/11/2018 FINDINGS: Lower chest: Probable atelectasis left base with tiny pleural effusions. 14 mm lesion medial right lower lobe better visualized on previous CT. Hepatobiliary: Nodular liver contour is associated with asymmetric enlargement of the lateral segment left liver, morphologic features suggesting underlying cirrhosis. 6.3 x 4.5 cm region of ill-defined high arterial phase hyper enhancement is identified in segment IV, corresponding to the abnormality seen on CT scan earlier today. There is a 1.6 x 2.9 cm central area of hypoenhancement within this lesion. Tiny gallstones evident. MRCP imaging is substantially motion degraded and nondiagnostic. No definite substantial extrahepatic biliary duct dilatation. Pancreas: Pancreatic parenchyma is diffusely atrophic with diffuse dilatation of the main pancreatic duct, measuring up to 6 mm diameter. Although study is motion degraded, no discrete mass lesion is identified in the head of the pancreas. No differential enhancement in the head of pancreas can be discerned. Spleen:  Unremarkable. Adrenals/Urinary Tract: No adrenal nodule or mass. Prominent extrarenal pelvis noted in each kidney. Small subcapsular cyst noted upper pole right kidney. Stomach/Bowel: Stomach is unremarkable. No gastric wall thickening. No evidence of outlet obstruction. Duodenum is normally positioned as is the ligament of Treitz. No small bowel or colonic dilatation within the visualized abdomen. Vascular/Lymphatic: No abdominal aortic aneurysm. Portal vein is patent. There is no gastrohepatic or hepatoduodenal ligament lymphadenopathy. No intraperitoneal or retroperitoneal lymphadenopathy. Other:  No substantial  intraperitoneal free fluid. Musculoskeletal: No abnormal marrow enhancement within the visualized bony anatomy. IMPRESSION: 1. Markedly motion degraded study. 2. Nodular liver contour is associated with enlargement of the lateral segment left liver, features suggesting cirrhosis. Abnormality on CT scan earlier today shows early ill-defined hyper enhancement involving segment 4 suggesting ill-defined lesion measuring up to 6.3 x 4.5 cm. Potential central focus of differentially increased washout. Lesion difficult to assess given motion degradation on  postcontrast imaging but infiltrative HCC cannot be excluded. 3. Cholelithiasis. 4. MRCP portion of this exam is nondiagnostic. 5. Diffuse pancreatic atrophy with diffuse dilatation of the main pancreatic duct measuring up to 6 mm. No discrete mass lesion identified in the head of pancreas although assessment is limited by motion artifact. 6. Tiny bilateral pleural effusions with probable dependent atelectasis in the left lower lobe in a more focal rounded 14 mm lesion medial right lower lobe, better visualized on CT scan earlier today. Electronically Signed   By: Misty Stanley M.D.   On: 11/11/2018 18:45   US Abdomen Limited Ruq  Result Date: 11/13/2018 CLINICAL DATA:  78 year old male with a history of choledocholithiasis EXAM: ULTRASOUND ABDOMEN LIMITED RIGHT UPPER QUADRANT COMPARISON:  MR 11/11/2018, CT 11/11/2018 FINDINGS: Gallbladder: Negative sonographic Murphy's sign. Echogenic debris layered dependently within the gallbladder lumen. No gallbladder wall thickening. No pericholecystic fluid. 6 mm echogenic focus at the neck of the gallbladder. Common bile duct measures 4 mm-5 mm. Common bile duct: Diameter: 4 mm-5 mm Liver: The mass at the dome of the liver is not visualized on the current ultrasound survey. Heterogeneous echotexture of liver parenchyma, with nodular surface. Portal vein is patent on color Doppler imaging with normal direction of blood flow  towards the liver. IMPRESSION: Cholelithiasis without sonographic evidence of acute cholecystitis. Ultrasound is nondiagnostic for the presence of choledocholithiasis. Cirrhotic morphology of the liver. The infiltrative mass at the dome of the liver not visualized on the current ultrasound. Electronically Signed   By: Corrie Mckusick D.O.   On: 11/13/2018 09:11         Discharge Exam: Vitals:   11/14/18 0520 11/14/18 1214  BP: 140/85 (!) 152/69  Pulse: 96 92  Resp: 18   Temp: 98.5 F (36.9 C)   SpO2: 98%    Vitals:   11/13/18 1523 11/13/18 2136 11/14/18 0520 11/14/18 1214  BP: 138/72 134/81 140/85 (!) 152/69  Pulse: (!) 106 97 96 92  Resp: 19 18 18    Temp: 97.9 F (36.6 C) 97.9 F (36.6 C) 98.5 F (36.9 C)   TempSrc: Oral Oral Oral   SpO2: 99% 100% 98%   Weight:      Height:        General: Pt is alert, awake, not in acute distress Cardiovascular: RRR, S1/S2 +, no rubs, no gallops Respiratory: CTA bilaterally, no wheezing, no rhonchi Abdominal: Soft, NT, ND, bowel sounds + Extremities: no edema, no cyanosis   The results of significant diagnostics from this hospitalization (including imaging, microbiology, ancillary and laboratory) are listed below for reference.    Significant Diagnostic Studies: Ct Abdomen Pelvis Wo Contrast  Result Date: 11/11/2018 CLINICAL DATA:  Lower abdominal pain, shortness of breath, chest pain EXAM: CT CHEST, ABDOMEN AND PELVIS WITHOUT CONTRAST TECHNIQUE: Multidetector CT imaging of the chest, abdomen and pelvis was performed following the standard protocol without IV contrast. Oral enteric contrast was administered. COMPARISON:  CT abdomen pelvis, 12/09/2013 FINDINGS: CT CHEST FINDINGS Cardiovascular: Three-vessel coronary artery calcifications. Status post aortic valve replacement. Normal heart size. No pericardial effusion. Mediastinum/Nodes: No enlarged mediastinal, hilar, or axillary lymph nodes. Thyroid gland, trachea, and esophagus  demonstrate no significant findings. Lungs/Pleura: Small, chronic appearing bilateral pleural effusions. Biapical pleuroparenchymal scarring. Bibasilar scarring and/or atelectasis. Musculoskeletal: No chest wall mass or suspicious bone lesions identified. CT ABDOMEN PELVIS FINDINGS Hepatobiliary: There is a retracted appearing region of the central right lobe of the liver with a hypodense internal lesion measuring approximately 2.7 by 1.5 cm (  series 3, image 42). Hypodense lesion is not noted on prior examination dated 12/09/2013. Small gallstones in the gallbladder. No gallbladder wall thickening, or biliary dilatation. There is a small calcification in the pancreatic head, in the general vicinity of the distal common bile duct (series 3, image 61, series 6, image 61). Pancreas: Generally atrophic appearance of the pancreatic parenchyma with prominence of the main pancreatic duct. Spleen: Normal in size without focal abnormality. Adrenals/Urinary Tract: Adrenal glands are unremarkable. Kidneys are normal, without renal calculi, focal lesion, or hydronephrosis. Bladder is unremarkable. Stomach/Bowel: Stomach is within normal limits. No evidence of bowel wall thickening, distention, or inflammatory changes. Vascular/Lymphatic: Calcific atherosclerosis. No enlarged abdominal or pelvic lymph nodes. Reproductive: No mass or other abnormality. Other: No abdominal wall hernia or abnormality. No abdominopelvic ascites. Musculoskeletal: No acute or significant osseous findings. Status post left hip total arthroplasty. Dense streak artifact from arthroplasty somewhat limits evaluation of the low pelvis. IMPRESSION: 1. Small, chronic appearing bilateral pleural effusions. Biapical pleuroparenchymal scarring. Bibasilar scarring and/or atelectasis. No acute CT findings of the chest. 2.  Coronary artery disease.  Status post aortic valve replacement. 3. Cholelithiasis. There is a small calcification in the pancreatic head, in  the general vicinity of the distal common bile duct (series 3, image 61, series 6, image 61). This is new compared to prior examination and may reflect a choledochal left. This may be further evaluated by MRI/MRCP if indicated by clinical signs and symptoms. There is no gross biliary ductal dilatation. 4. Generally atrophic appearance of the pancreatic parenchyma with prominence of the main pancreatic duct, ductal prominence new compared to prior examination and of uncertain significance. As above, this may be further evaluated by MRI/MRCP. 5. There is a retracted appearing region of the central right lobe of the liver with a hypodense internal lesion measuring approximately 2.7 by 1.5 cm (series 3, image 42). Hypodense lesion is not noted on prior examination dated 09/73/5329 and is of uncertain significance. As above, this may be further evaluated by contrast enhanced multiphasic MRI if desired. 6.  Other chronic and incidental findings as detailed above. Electronically Signed   By: Eddie Candle M.D.   On: 11/11/2018 13:58   Ct Chest Wo Contrast  Result Date: 11/11/2018 CLINICAL DATA:  Lower abdominal pain, shortness of breath, chest pain EXAM: CT CHEST, ABDOMEN AND PELVIS WITHOUT CONTRAST TECHNIQUE: Multidetector CT imaging of the chest, abdomen and pelvis was performed following the standard protocol without IV contrast. Oral enteric contrast was administered. COMPARISON:  CT abdomen pelvis, 12/09/2013 FINDINGS: CT CHEST FINDINGS Cardiovascular: Three-vessel coronary artery calcifications. Status post aortic valve replacement. Normal heart size. No pericardial effusion. Mediastinum/Nodes: No enlarged mediastinal, hilar, or axillary lymph nodes. Thyroid gland, trachea, and esophagus demonstrate no significant findings. Lungs/Pleura: Small, chronic appearing bilateral pleural effusions. Biapical pleuroparenchymal scarring. Bibasilar scarring and/or atelectasis. Musculoskeletal: No chest wall mass or suspicious  bone lesions identified. CT ABDOMEN PELVIS FINDINGS Hepatobiliary: There is a retracted appearing region of the central right lobe of the liver with a hypodense internal lesion measuring approximately 2.7 by 1.5 cm (series 3, image 42). Hypodense lesion is not noted on prior examination dated 12/09/2013. Small gallstones in the gallbladder. No gallbladder wall thickening, or biliary dilatation. There is a small calcification in the pancreatic head, in the general vicinity of the distal common bile duct (series 3, image 61, series 6, image 61). Pancreas: Generally atrophic appearance of the pancreatic parenchyma with prominence of the main pancreatic duct. Spleen: Normal in size  without focal abnormality. Adrenals/Urinary Tract: Adrenal glands are unremarkable. Kidneys are normal, without renal calculi, focal lesion, or hydronephrosis. Bladder is unremarkable. Stomach/Bowel: Stomach is within normal limits. No evidence of bowel wall thickening, distention, or inflammatory changes. Vascular/Lymphatic: Calcific atherosclerosis. No enlarged abdominal or pelvic lymph nodes. Reproductive: No mass or other abnormality. Other: No abdominal wall hernia or abnormality. No abdominopelvic ascites. Musculoskeletal: No acute or significant osseous findings. Status post left hip total arthroplasty. Dense streak artifact from arthroplasty somewhat limits evaluation of the low pelvis. IMPRESSION: 1. Small, chronic appearing bilateral pleural effusions. Biapical pleuroparenchymal scarring. Bibasilar scarring and/or atelectasis. No acute CT findings of the chest. 2.  Coronary artery disease.  Status post aortic valve replacement. 3. Cholelithiasis. There is a small calcification in the pancreatic head, in the general vicinity of the distal common bile duct (series 3, image 61, series 6, image 61). This is new compared to prior examination and may reflect a choledochal left. This may be further evaluated by MRI/MRCP if indicated by  clinical signs and symptoms. There is no gross biliary ductal dilatation. 4. Generally atrophic appearance of the pancreatic parenchyma with prominence of the main pancreatic duct, ductal prominence new compared to prior examination and of uncertain significance. As above, this may be further evaluated by MRI/MRCP. 5. There is a retracted appearing region of the central right lobe of the liver with a hypodense internal lesion measuring approximately 2.7 by 1.5 cm (series 3, image 42). Hypodense lesion is not noted on prior examination dated 48/54/6270 and is of uncertain significance. As above, this may be further evaluated by contrast enhanced multiphasic MRI if desired. 6.  Other chronic and incidental findings as detailed above. Electronically Signed   By: Eddie Candle M.D.   On: 11/11/2018 13:58   Mr 3d Recon At Scanner  Result Date: 11/11/2018 CLINICAL DATA:  Question distal common bile duct stone on recent CT scan. EXAM: MRI ABDOMEN WITHOUT AND WITH CONTRAST (INCLUDING MRCP) TECHNIQUE: Multiplanar multisequence MR imaging of the abdomen was performed both before and after the administration of intravenous contrast. Heavily T2-weighted images of the biliary and pancreatic ducts were obtained, and three-dimensional MRCP images were rendered by post processing. CONTRAST:  6 cc Gadavist COMPARISON:  CT scan 11/11/2018 FINDINGS: Lower chest: Probable atelectasis left base with tiny pleural effusions. 14 mm lesion medial right lower lobe better visualized on previous CT. Hepatobiliary: Nodular liver contour is associated with asymmetric enlargement of the lateral segment left liver, morphologic features suggesting underlying cirrhosis. 6.3 x 4.5 cm region of ill-defined high arterial phase hyper enhancement is identified in segment IV, corresponding to the abnormality seen on CT scan earlier today. There is a 1.6 x 2.9 cm central area of hypoenhancement within this lesion. Tiny gallstones evident. MRCP imaging is  substantially motion degraded and nondiagnostic. No definite substantial extrahepatic biliary duct dilatation. Pancreas: Pancreatic parenchyma is diffusely atrophic with diffuse dilatation of the main pancreatic duct, measuring up to 6 mm diameter. Although study is motion degraded, no discrete mass lesion is identified in the head of the pancreas. No differential enhancement in the head of pancreas can be discerned. Spleen:  Unremarkable. Adrenals/Urinary Tract: No adrenal nodule or mass. Prominent extrarenal pelvis noted in each kidney. Small subcapsular cyst noted upper pole right kidney. Stomach/Bowel: Stomach is unremarkable. No gastric wall thickening. No evidence of outlet obstruction. Duodenum is normally positioned as is the ligament of Treitz. No small bowel or colonic dilatation within the visualized abdomen. Vascular/Lymphatic: No abdominal aortic aneurysm.  Portal vein is patent. There is no gastrohepatic or hepatoduodenal ligament lymphadenopathy. No intraperitoneal or retroperitoneal lymphadenopathy. Other:  No substantial intraperitoneal free fluid. Musculoskeletal: No abnormal marrow enhancement within the visualized bony anatomy. IMPRESSION: 1. Markedly motion degraded study. 2. Nodular liver contour is associated with enlargement of the lateral segment left liver, features suggesting cirrhosis. Abnormality on CT scan earlier today shows early ill-defined hyper enhancement involving segment 4 suggesting ill-defined lesion measuring up to 6.3 x 4.5 cm. Potential central focus of differentially increased washout. Lesion difficult to assess given motion degradation on postcontrast imaging but infiltrative HCC cannot be excluded. 3. Cholelithiasis. 4. MRCP portion of this exam is nondiagnostic. 5. Diffuse pancreatic atrophy with diffuse dilatation of the main pancreatic duct measuring up to 6 mm. No discrete mass lesion identified in the head of pancreas although assessment is limited by motion  artifact. 6. Tiny bilateral pleural effusions with probable dependent atelectasis in the left lower lobe in a more focal rounded 14 mm lesion medial right lower lobe, better visualized on CT scan earlier today. Electronically Signed   By: Misty Stanley M.D.   On: 11/11/2018 18:45   Mr Abdomen With Mrcp W Contrast  Result Date: 11/11/2018 CLINICAL DATA:  Question distal common bile duct stone on recent CT scan. EXAM: MRI ABDOMEN WITHOUT AND WITH CONTRAST (INCLUDING MRCP) TECHNIQUE: Multiplanar multisequence MR imaging of the abdomen was performed both before and after the administration of intravenous contrast. Heavily T2-weighted images of the biliary and pancreatic ducts were obtained, and three-dimensional MRCP images were rendered by post processing. CONTRAST:  6 cc Gadavist COMPARISON:  CT scan 11/11/2018 FINDINGS: Lower chest: Probable atelectasis left base with tiny pleural effusions. 14 mm lesion medial right lower lobe better visualized on previous CT. Hepatobiliary: Nodular liver contour is associated with asymmetric enlargement of the lateral segment left liver, morphologic features suggesting underlying cirrhosis. 6.3 x 4.5 cm region of ill-defined high arterial phase hyper enhancement is identified in segment IV, corresponding to the abnormality seen on CT scan earlier today. There is a 1.6 x 2.9 cm central area of hypoenhancement within this lesion. Tiny gallstones evident. MRCP imaging is substantially motion degraded and nondiagnostic. No definite substantial extrahepatic biliary duct dilatation. Pancreas: Pancreatic parenchyma is diffusely atrophic with diffuse dilatation of the main pancreatic duct, measuring up to 6 mm diameter. Although study is motion degraded, no discrete mass lesion is identified in the head of the pancreas. No differential enhancement in the head of pancreas can be discerned. Spleen:  Unremarkable. Adrenals/Urinary Tract: No adrenal nodule or mass. Prominent extrarenal  pelvis noted in each kidney. Small subcapsular cyst noted upper pole right kidney. Stomach/Bowel: Stomach is unremarkable. No gastric wall thickening. No evidence of outlet obstruction. Duodenum is normally positioned as is the ligament of Treitz. No small bowel or colonic dilatation within the visualized abdomen. Vascular/Lymphatic: No abdominal aortic aneurysm. Portal vein is patent. There is no gastrohepatic or hepatoduodenal ligament lymphadenopathy. No intraperitoneal or retroperitoneal lymphadenopathy. Other:  No substantial intraperitoneal free fluid. Musculoskeletal: No abnormal marrow enhancement within the visualized bony anatomy. IMPRESSION: 1. Markedly motion degraded study. 2. Nodular liver contour is associated with enlargement of the lateral segment left liver, features suggesting cirrhosis. Abnormality on CT scan earlier today shows early ill-defined hyper enhancement involving segment 4 suggesting ill-defined lesion measuring up to 6.3 x 4.5 cm. Potential central focus of differentially increased washout. Lesion difficult to assess given motion degradation on postcontrast imaging but infiltrative HCC cannot be excluded. 3. Cholelithiasis. 4.  MRCP portion of this exam is nondiagnostic. 5. Diffuse pancreatic atrophy with diffuse dilatation of the main pancreatic duct measuring up to 6 mm. No discrete mass lesion identified in the head of pancreas although assessment is limited by motion artifact. 6. Tiny bilateral pleural effusions with probable dependent atelectasis in the left lower lobe in a more focal rounded 14 mm lesion medial right lower lobe, better visualized on CT scan earlier today. Electronically Signed   By: Misty Stanley M.D.   On: 11/11/2018 18:45   US Abdomen Limited Ruq  Result Date: 11/13/2018 CLINICAL DATA:  78 year old male with a history of choledocholithiasis EXAM: ULTRASOUND ABDOMEN LIMITED RIGHT UPPER QUADRANT COMPARISON:  MR 11/11/2018, CT 11/11/2018 FINDINGS: Gallbladder:  Negative sonographic Murphy's sign. Echogenic debris layered dependently within the gallbladder lumen. No gallbladder wall thickening. No pericholecystic fluid. 6 mm echogenic focus at the neck of the gallbladder. Common bile duct measures 4 mm-5 mm. Common bile duct: Diameter: 4 mm-5 mm Liver: The mass at the dome of the liver is not visualized on the current ultrasound survey. Heterogeneous echotexture of liver parenchyma, with nodular surface. Portal vein is patent on color Doppler imaging with normal direction of blood flow towards the liver. IMPRESSION: Cholelithiasis without sonographic evidence of acute cholecystitis. Ultrasound is nondiagnostic for the presence of choledocholithiasis. Cirrhotic morphology of the liver. The infiltrative mass at the dome of the liver not visualized on the current ultrasound. Electronically Signed   By: Corrie Mckusick D.O.   On: 11/13/2018 09:11     Microbiology: No results found for this or any previous visit (from the past 240 hour(s)).   Labs: Basic Metabolic Panel: Recent Labs  Lab 11/11/18 0530 11/12/18 0535 11/13/18 0624  NA 134* 138 139  K 4.3 3.8 3.4*  CL 97* 107 108  CO2 27 24 22   GLUCOSE 221* 115* 108*  BUN 16 10 8   CREATININE 1.30* 1.01 0.90  CALCIUM 9.8 9.0 8.7*   Liver Function Tests: Recent Labs  Lab 11/11/18 0530 11/13/18 0624  AST 82* 35  ALT 82* 47*  ALKPHOS 536* 392*  BILITOT 1.2 1.0  PROT 7.5 6.2*  ALBUMIN 3.7 3.1*   Recent Labs  Lab 11/11/18 0600 11/12/18 0535 11/13/18 0624  LIPASE 1,405* 56* 37   No results for input(s): AMMONIA in the last 168 hours. CBC: Recent Labs  Lab 11/11/18 0530 11/12/18 0535  WBC 7.7 5.1  NEUTROABS 5.5  --   HGB 15.0 13.4  HCT 46.4 42.7  MCV 86.4 87.5  PLT 179 134*   Cardiac Enzymes: Recent Labs  Lab 11/11/18 0530  TROPONINI <0.03   BNP: Invalid input(s): POCBNP CBG: Recent Labs  Lab 11/13/18 1127 11/13/18 1622 11/13/18 2137 11/14/18 0726 11/14/18 1143  GLUCAP  100* 169* 145* 97 141*    Time coordinating discharge:  36 minutes  Signed:  Orson Eva, DO [facility] Pager: [phone] 11/14/2018, 5:13 PM

## 2018-11-14 NOTE — Progress Notes (Signed)
Nsg Discharge Note  Admit Date:  11/11/2018 Discharge date: 11/14/2018   [name] to be D/C'd home per MD order.  AVS completed.  Copy for chart, and copy for patient signed, and dated. Patient/caregiver able to verbalize understanding.  Discharge Medication: Allergies as of 11/14/2018      Reactions   Actos [pioglitazone] Other (See Comments)   Cramps in legs   Metformin And Related Diarrhea   Morphine    REACTION: hives   Statins    REACTION: leg cramps but tolerates pravastatin      Medication List    STOP taking these medications   metFORMIN 500 MG tablet Commonly known as:  GLUCOPHAGE     TAKE these medications   aspirin 81 MG EC tablet Take 1 tablet (81 mg total) by mouth daily. Swallow whole.   Basaglar KwikPen 100 UNIT/ML Sopn Inject 0.22 mLs (22 Units total) into the skin daily. Give number of units as directed at office visit, up to 30 units daily. What changed:    how much to take  additional instructions   fish oil-omega-3 fatty acids 1000 MG capsule Take 1 g by mouth daily.   glucose blood test strip Commonly known as:  ONE TOUCH ULTRA TEST CHECK BLOOD SUGAR TWICE DAILY.PLEASE DISPENSE BASED ON INSURANCE PREFERENCE ICD 10 E11.9,Z79.4   lisinopril 2.5 MG tablet Commonly known as:  PRINIVIL,ZESTRIL Take 1 tablet (2.5 mg total) by mouth daily.   meclizine 12.5 MG tablet Commonly known as:  ANTIVERT Take 1 tablet (12.5 mg total) by mouth 3 (three) times daily as needed for dizziness.   metoprolol succinate 50 MG 24 hr tablet Commonly known as:  TOPROL-XL TAKE 1 TABLET DAILY WITH ORIMMEDIATELY FOLLOWING A    MEAL   omeprazole 20 MG capsule Commonly known as:  PRILOSEC Take 1 capsule (20 mg total) by mouth daily.   ondansetron 4 MG tablet Commonly known as:  Zofran Take 1 tablet (4 mg total) by mouth every 8 (eight) hours as needed for nausea or vomiting.   rosuvastatin 40 MG tablet Commonly known as:  Crestor Take 1 tablet (40 mg total) by  mouth daily.   Vitamin D 50 MCG (2000 UT) tablet Take 1 tablet (2,000 Units total) by mouth daily.       Discharge Assessment: Vitals:   11/14/18 0520 11/14/18 1214  BP: 140/85 (!) 152/69  Pulse: 96 92  Resp: 18   Temp: 98.5 F (36.9 C)   SpO2: 98%    Skin clean, dry and intact without evidence of skin break down, no evidence of skin tears noted. IV catheter discontinued intact. Site without signs and symptoms of complications - no redness or edema noted at insertion site, patient denies c/o pain - only slight tenderness at site.  Dressing with slight pressure applied.  D/c Instructions-Education: Discharge instructions given to patient with verbalized understanding. D/c education completed with patient/family including follow up instructions, medication list, d/c activities limitations if indicated, with other d/c instructions as indicated by MD - patient able to verbalize understanding, all questions fully answered. Patient instructed to return to ED, call 911, or call MD for any changes in condition.  Patient escorted via [place], and D/C home via private auto.  [name] C, RN 11/14/2018 1:20 PM

## 2018-11-14 NOTE — Clinical Social Work Note (Signed)
Message left for [place] scheduler requesting that patient be contacted to schedule an appointment. Patient demographics were left on voicemail.   Dayten Juba, Clydene Pugh, LCSW

## 2018-11-17 ENCOUNTER — Other Ambulatory Visit: Payer: Self-pay | Admitting: Family Medicine

## 2018-11-18 ENCOUNTER — Other Ambulatory Visit: Payer: Self-pay | Admitting: Hematology

## 2018-11-18 DIAGNOSIS — R160 Hepatomegaly, not elsewhere classified: Secondary | ICD-10-CM

## 2018-11-19 ENCOUNTER — Encounter: Payer: Self-pay | Admitting: *Deleted

## 2018-11-19 ENCOUNTER — Other Ambulatory Visit: Payer: Self-pay

## 2018-11-19 ENCOUNTER — Ambulatory Visit
Admission: RE | Admit: 2018-11-19 | Discharge: 2018-11-19 | Disposition: A | Discharge status: Home / Self Care | Payer: Medicare HMO | Source: Ambulatory Visit | Attending: Hematology | Admitting: Hematology

## 2018-11-19 DIAGNOSIS — R160 Hepatomegaly, not elsewhere classified: Secondary | ICD-10-CM

## 2018-11-19 DIAGNOSIS — R1310 Dysphagia, unspecified: Secondary | ICD-10-CM | POA: Diagnosis not present

## 2018-11-19 DIAGNOSIS — R220 Localized swelling, mass and lump, head: Secondary | ICD-10-CM | POA: Diagnosis not present

## 2018-11-19 HISTORY — PX: IR RADIOLOGIST EVAL & MGMT: IMG5224

## 2018-11-19 NOTE — Telephone Encounter (Signed)
Okay, please schedule him for upper EUS in 4 weeks from now (myself or Dr. Rush Landmark).  I am not sure if everything will still have to be done at [facility] but that would probably be the place to start.  Let me know when it can be, if I still have Thursday morning availability that would be great although block schedules are sort of out the window for now given the pandemic.  Please also let the patient and the referring know that given the pandemic his case may have to be delayed depending on the restrictions 3 or 4 weeks from now.

## 2018-11-19 NOTE — Consult Note (Signed)
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 social distancing).  This format is felt to be most appropriate for this patient at this time.  All issues noted in this document were discussed and addressed.

## 2018-11-20 ENCOUNTER — Other Ambulatory Visit: Payer: Self-pay

## 2018-11-20 ENCOUNTER — Other Ambulatory Visit: Payer: Self-pay | Admitting: Interventional Radiology

## 2018-11-20 DIAGNOSIS — R160 Hepatomegaly, not elsewhere classified: Secondary | ICD-10-CM

## 2018-11-20 NOTE — Patient Outreach (Signed)
[facility]) Care Management  11/20/2018  [name] June 22, 1941 [id number]   EMMI- General Discharge RED ON EMMI ALERT Day # 4 Date: 11/19/2018 Red Alert Reason:  Got discharge papers? No  Know who to call about changes in condition? No  Scheduled follow-up? No    Outreach attempt: No answer.  HIPAA compliant voice message left.     Plan: RN CM will attempt again within 4 business days and send letter.    Jone Baseman, RN, MSN [facility] Care Management Care Management Coordinator Direct Line [phone] Toll Free: [phone]  Fax: [phone]

## 2018-11-20 NOTE — Telephone Encounter (Signed)
Left message on machine to call back  

## 2018-11-21 ENCOUNTER — Other Ambulatory Visit: Payer: Self-pay

## 2018-11-21 NOTE — Patient Outreach (Signed)
[facility]) Care Management  11/21/2018  IOKEPA GEFFRE [birth year] [id number]   EMMI- General Discharge RED ON EMMI ALERT Day # 4 Date:11/19/2018 Red Alert Reason: Got discharge papers? No  Know who to call about changes in condition? No  Scheduled follow-up? No    Outreach attempt: No answer.  HIPAA compliant voice message left.     Plan: RN CM will attempt again within 4 business days.  Jone Baseman, RN, MSN [facility] Care Management Coordinator Direct Line [phone] Cell [phone] Toll Free: [phone]  Fax: [phone]

## 2018-11-23 ENCOUNTER — Emergency Department (HOSPITAL_COMMUNITY)
Admission: EM | Admit: 2018-11-23 | Discharge: 2018-11-23 | Disposition: A | Discharge status: Home / Self Care | Payer: Medicare HMO | Attending: Emergency Medicine | Admitting: Emergency Medicine

## 2018-11-23 ENCOUNTER — Emergency Department (HOSPITAL_COMMUNITY): Payer: Medicare HMO

## 2018-11-23 ENCOUNTER — Encounter (HOSPITAL_COMMUNITY): Payer: Self-pay | Admitting: *Deleted

## 2018-11-23 ENCOUNTER — Other Ambulatory Visit: Payer: Self-pay

## 2018-11-23 DIAGNOSIS — Y999 Unspecified external cause status: Secondary | ICD-10-CM | POA: Insufficient documentation

## 2018-11-23 DIAGNOSIS — S42202A Unspecified fracture of upper end of left humerus, initial encounter for closed fracture: Secondary | ICD-10-CM

## 2018-11-23 DIAGNOSIS — I2510 Atherosclerotic heart disease of native coronary artery without angina pectoris: Secondary | ICD-10-CM | POA: Insufficient documentation

## 2018-11-23 DIAGNOSIS — M25522 Pain in left elbow: Secondary | ICD-10-CM | POA: Diagnosis not present

## 2018-11-23 DIAGNOSIS — Y929 Unspecified place or not applicable: Secondary | ICD-10-CM | POA: Insufficient documentation

## 2018-11-23 DIAGNOSIS — Y93E1 Activity, personal bathing and showering: Secondary | ICD-10-CM | POA: Insufficient documentation

## 2018-11-23 DIAGNOSIS — Z85820 Personal history of malignant melanoma of skin: Secondary | ICD-10-CM | POA: Insufficient documentation

## 2018-11-23 DIAGNOSIS — Z87891 Personal history of nicotine dependence: Secondary | ICD-10-CM | POA: Insufficient documentation

## 2018-11-23 DIAGNOSIS — Z7982 Long term (current) use of aspirin: Secondary | ICD-10-CM | POA: Diagnosis not present

## 2018-11-23 DIAGNOSIS — E119 Type 2 diabetes mellitus without complications: Secondary | ICD-10-CM | POA: Diagnosis not present

## 2018-11-23 DIAGNOSIS — M25552 Pain in left hip: Secondary | ICD-10-CM | POA: Diagnosis not present

## 2018-11-23 DIAGNOSIS — S199XXA Unspecified injury of neck, initial encounter: Secondary | ICD-10-CM | POA: Diagnosis not present

## 2018-11-23 DIAGNOSIS — I10 Essential (primary) hypertension: Secondary | ICD-10-CM | POA: Diagnosis not present

## 2018-11-23 DIAGNOSIS — Z79899 Other long term (current) drug therapy: Secondary | ICD-10-CM | POA: Insufficient documentation

## 2018-11-23 DIAGNOSIS — Y92009 Unspecified place in unspecified non-institutional (private) residence as the place of occurrence of the external cause: Secondary | ICD-10-CM

## 2018-11-23 DIAGNOSIS — S4992XA Unspecified injury of left shoulder and upper arm, initial encounter: Secondary | ICD-10-CM | POA: Diagnosis not present

## 2018-11-23 DIAGNOSIS — S0990XA Unspecified injury of head, initial encounter: Secondary | ICD-10-CM | POA: Diagnosis not present

## 2018-11-23 DIAGNOSIS — R079 Chest pain, unspecified: Secondary | ICD-10-CM | POA: Insufficient documentation

## 2018-11-23 DIAGNOSIS — W19XXXA Unspecified fall, initial encounter: Secondary | ICD-10-CM

## 2018-11-23 DIAGNOSIS — W010XXA Fall on same level from slipping, tripping and stumbling without subsequent striking against object, initial encounter: Secondary | ICD-10-CM | POA: Diagnosis not present
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
## 2018-11-23 NOTE — ED Triage Notes (Signed)
Pt slipped while in shower and landed on left side, c/o left  shoulder and side pain. Denies hitting his head.

## 2018-11-23 NOTE — ED Notes (Signed)
Pt. Ambulated well with little or no assistance.

## 2018-11-23 NOTE — ED Notes (Signed)
Patient transported to X-ray 

## 2018-11-23 NOTE — ED Notes (Signed)
From Rad 

## 2018-11-23 NOTE — Discharge Instructions (Addendum)
Take your usual prescriptions as previously directed.  Wear the sling until you are seen in follow up. Apply moist heat or ice to the area(s) of discomfort, for 15 minutes at a time, several times per day for the next few days.  Do not fall asleep on a heating or ice pack.  Call your regular medical doctor and the Orthopedic doctor tomorrow to schedule a follow up appointment in the next 3 days.  Return to the Emergency Department immediately if worsening.

## 2018-11-23 NOTE — ED Notes (Signed)
Dr Tonye Becket in to discuss findings and reassess

## 2018-11-23 NOTE — ED Notes (Signed)
Pt returned from xray

## 2018-11-24 ENCOUNTER — Telehealth: Payer: Self-pay | Admitting: Orthopedic Surgery

## 2018-11-24 ENCOUNTER — Encounter (HOSPITAL_COMMUNITY): Payer: Self-pay | Admitting: Hematology

## 2018-11-24 ENCOUNTER — Inpatient Hospital Stay (HOSPITAL_COMMUNITY): Payer: Medicare HMO | Attending: Hematology | Admitting: Hematology

## 2018-11-24 VITALS — BP 99/52 | HR 94 | Temp 97.5°F | Wt 126.4 lb

## 2018-11-24 DIAGNOSIS — K769 Liver disease, unspecified: Secondary | ICD-10-CM

## 2018-11-24 DIAGNOSIS — Z87891 Personal history of nicotine dependence: Secondary | ICD-10-CM | POA: Diagnosis not present

## 2018-11-24 DIAGNOSIS — C220 Liver cell carcinoma: Secondary | ICD-10-CM | POA: Insufficient documentation

## 2018-11-24 NOTE — Telephone Encounter (Signed)
Patient and spouse called to request appointment with Dr Aline Brochure following [facility] visit; patient had a fall and has fractured his left shoulder. Discussed appointment protocol regarding covid-19 restrictions and also that our providers are out of clinic today, 11/24/18 (patient had last seen Dr Aline Brochure in December of 2019.)  Patient aware that Dr Aline Brochure is returning to clinic tomorrow, and will review and advise *patient also notes that he is seeing oncologist through [facility].  Best call back [phone],
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
## 2018-11-24 NOTE — Patient Instructions (Signed)
[place] at [facility] Discharge Instructions  You were seen today by Dr. Delton Coombes. He went over your plan and what the next step is. HE will get you scheduled for a bone scan. He will see you back after your scan for follow up.   Thank you for choosing [place] at [facility] to provide your oncology and hematology care.  To afford each patient quality time with our provider, please arrive at least 15 minutes before your scheduled appointment time.   If you have a lab appointment with the [facility] please come in thru the  Main Entrance and check in at the main information desk  You need to re-schedule your appointment should you arrive 10 or more minutes late.  We strive to give you quality time with our providers, and arriving late affects you and other patients whose appointments are after yours.  Also, if you no show three or more times for appointments you may be dismissed from the clinic at the providers discretion.     Again, thank you for choosing [facility].  Our hope is that these requests will decrease the amount of time that you wait before being seen by our physicians.       _____________________________________________________________  Should you have questions after your visit to [facility], please contact our office at [phone] between the hours of 8:00 a.m. and 4:30 p.m.  Voicemails left after 4:00 p.m. will not be returned until the following business day.  For prescription refill requests, have your pharmacy contact our office and allow 72 hours.    Cancer Center Support Programs:   > Cancer Support Group  2nd Tuesday of the month 1pm-2pm, Journey Room

## 2018-11-24 NOTE — Assessment & Plan Note (Addendum)
1.  Hepatocellular carcinoma: - He was recently admitted to the hospital with epigastric pain, CT scan of the abdomen showed liver mass.  No other metastatic disease noted. -MRI of the liver on 11/11/2018 shows nodular liver contour with enlargement of the lateral segment of the left liver, features suggesting cirrhosis.  Early ill-defined hyperenhancement involving segment 4 suggesting ill-defined lesion, measuring 6.3 x 4.5 cm.  Lesion difficult to assess given motion degradation on postcontrast imaging but infiltrative HCC cannot be excluded. -AFP was elevated at 1451.  He does not have any traditional risk factors for cirrhosis including hepatitis and alcoholism. -He was evaluated by Dr. Laurence Ferrari from IR who recommended bland embolization. -Patient went to the ER yesterday because of fall.  X-rays of his left shoulder, left rib series were negative for any fracture. -Patient reports about 23 pound weight loss in the last 3 months.  He does not have any appetite.  We will start him on Marinol 2.5 mg twice daily.  We discussed about side effects in detail. -I have recommended doing a bone scan to complete work-up to make sure he does not have any bone mets. - We will see him back after the bone scan.

## 2018-11-25 ENCOUNTER — Other Ambulatory Visit: Payer: Self-pay

## 2018-11-25 ENCOUNTER — Ambulatory Visit: Payer: Medicare HMO

## 2018-11-25 NOTE — Telephone Encounter (Signed)
Called back to patient; scheduled per Dr Ruthe Mannan response; aware.

## 2018-11-25 NOTE — Patient Outreach (Signed)
[facility]) Care Management  11/25/2018  [name] 23-Jul-1941 [id number]   EMMI-General Discharge RED ON EMMI ALERT Day #4 Date:11/19/2018 Red Alert Reason: Got discharge papers? No  Know who to call about changes in condition? No  Scheduled follow-up? No   Spoke with patient.  He is able to verify HIPAA.  He states that he has read his discharge papers and offers no concerns.  Patient not hearing well and put his wife on the phone.  She states that patient saw the cancer doctor on yesterday and patient to have bone scan and follow up with the cancer doctor. She also states that the doctor will be getting back with them about specific plans today.  She states that patient had a fall over the weekend that resulted in fractured left shoulder and will be seeing orthopedic doctor on Monday. She denies any problems with transportation.  She says they are just waiting for further plans from the doctor.  She denies any needs presently.    Plan: RN CM will close case.    Jone Baseman, RN, MSN [place] Care Management Coordinator Direct Line [phone] Cell [phone] Toll Free: [phone]  Fax: [phone]

## 2018-11-25 NOTE — Telephone Encounter (Signed)
Wear sling   In person visit repeat xrays mon 27th

## 2018-11-26 ENCOUNTER — Telehealth (INDEPENDENT_AMBULATORY_CARE_PROVIDER_SITE_OTHER): Payer: Self-pay | Admitting: Internal Medicine

## 2018-11-26 NOTE — Telephone Encounter (Signed)
Patients wife left message stating Dr Laural Golden had seen her husband in the hospital for gallstones - she wants to know what he can take for nausea - please call [phone]

## 2018-11-26 NOTE — Telephone Encounter (Signed)
Talked with the patient 's wife. She says that her husband is gagging when he eats , this makes him throw up. She ask if this is related to his gallstones.  Reviewed with Dr.Rehman and he ask that the patient be on a liquid diet. Mrs. Timmons says that he is taking the nausea medication ,but only as needed. She will give him a 1/2 - 1 tablet before evening meal and see if this helps. She will call back in the morning with a progress report.

## 2018-11-29 ENCOUNTER — Emergency Department (HOSPITAL_COMMUNITY): Payer: Medicare HMO

## 2018-11-29 ENCOUNTER — Other Ambulatory Visit: Payer: Self-pay

## 2018-11-29 ENCOUNTER — Emergency Department (HOSPITAL_COMMUNITY)
Admission: EM | Admit: 2018-11-29 | Discharge: 2018-11-30 | Disposition: A | Discharge status: Home / Self Care | Payer: Medicare HMO | Attending: Emergency Medicine | Admitting: Emergency Medicine

## 2018-11-29 ENCOUNTER — Encounter (HOSPITAL_COMMUNITY): Payer: Self-pay | Admitting: Emergency Medicine

## 2018-11-29 DIAGNOSIS — R1084 Generalized abdominal pain: Secondary | ICD-10-CM | POA: Diagnosis not present

## 2018-11-29 DIAGNOSIS — I10 Essential (primary) hypertension: Secondary | ICD-10-CM | POA: Diagnosis not present

## 2018-11-29 DIAGNOSIS — R109 Unspecified abdominal pain: Secondary | ICD-10-CM | POA: Diagnosis not present

## 2018-11-29 DIAGNOSIS — R112 Nausea with vomiting, unspecified: Secondary | ICD-10-CM | POA: Diagnosis not present

## 2018-11-29 DIAGNOSIS — R1110 Vomiting, unspecified: Secondary | ICD-10-CM | POA: Diagnosis not present

## 2018-11-29 DIAGNOSIS — J9811 Atelectasis: Secondary | ICD-10-CM | POA: Diagnosis not present

## 2018-11-29 MED ORDER — FENTANYL CITRATE (PF) 100 MCG/2ML IJ SOLN
50.0000 ug | Freq: Once | INTRAMUSCULAR | Status: AC
  Administered 2018-11-29: 50 ug via INTRAVENOUS
  Filled 2018-11-29: qty 2

## 2018-11-29 MED ORDER — ONDANSETRON HCL 4 MG/2ML IJ SOLN
4.0000 mg | Freq: Once | INTRAMUSCULAR | Status: AC
  Administered 2018-11-29: 4 mg via INTRAVENOUS
  Filled 2018-11-29: qty 2

## 2018-11-29 NOTE — ED Triage Notes (Signed)
Pt DC'd from [facility] 4/19  Complains of no BM since 8 days   Abd pain and today emesis x 3

## 2018-11-30 ENCOUNTER — Emergency Department (HOSPITAL_COMMUNITY): Payer: Medicare HMO

## 2018-11-30 ENCOUNTER — Encounter (HOSPITAL_COMMUNITY): Payer: Self-pay | Admitting: Radiology

## 2018-11-30 DIAGNOSIS — J9811 Atelectasis: Secondary | ICD-10-CM | POA: Diagnosis not present

## 2018-11-30 DIAGNOSIS — R1084 Generalized abdominal pain: Secondary | ICD-10-CM | POA: Diagnosis not present

## 2018-11-30 DIAGNOSIS — R109 Unspecified abdominal pain: Secondary | ICD-10-CM | POA: Diagnosis not present

## 2018-11-30 DIAGNOSIS — R1110 Vomiting, unspecified: Secondary | ICD-10-CM | POA: Diagnosis not present

## 2018-11-30 DIAGNOSIS — R112 Nausea with vomiting, unspecified: Secondary | ICD-10-CM | POA: Diagnosis not present

## 2018-11-30 LAB — COMPREHENSIVE METABOLIC PANEL
ALT: 63 U/L — ABNORMAL HIGH (ref 0–44)
AST: 62 U/L — ABNORMAL HIGH (ref 15–41)
Albumin: 3.4 g/dL — ABNORMAL LOW (ref 3.5–5.0)
Alkaline Phosphatase: 600 U/L — ABNORMAL HIGH (ref 38–126)
Anion gap: 12 (ref 5–15)
BUN: 16 mg/dL (ref 8–23)
CO2: 22 mmol/L (ref 22–32)
Calcium: 9.5 mg/dL (ref 8.9–10.3)
Chloride: 100 mmol/L (ref 98–111)
Creatinine, Ser: 1.16 mg/dL (ref 0.61–1.24)
GFR calc Af Amer: >60 mL/min (ref >60)
GFR calc non Af Amer: 60 mL/min — ABNORMAL LOW (ref >60)
Glucose, Bld: 205 mg/dL — ABNORMAL HIGH (ref 70–99)
Potassium: 3.3 mmol/L — ABNORMAL LOW (ref 3.5–5.1)
Sodium: 134 mmol/L — ABNORMAL LOW (ref 135–145)
Total Bilirubin: 1.2 mg/dL (ref 0.3–1.2)
Total Protein: 7 g/dL (ref 6.5–8.1)

## 2018-11-30 LAB — LIPASE, BLOOD: Lipase: 44 U/L (ref 11–51)

## 2018-11-30 LAB — CBC WITH DIFFERENTIAL/PLATELET
Abs Immature Granulocytes: 0.02 10*3/uL (ref 0.00–0.07)
Basophils Absolute: 0.1 10*3/uL (ref 0.0–0.1)
Basophils Relative: 1 %
Eosinophils Absolute: 0.1 10*3/uL (ref 0.0–0.5)
Eosinophils Relative: 1 %
HCT: 43.9 % (ref 39.0–52.0)
Hemoglobin: 14.5 g/dL (ref 13.0–17.0)
Immature Granulocytes: 0 %
Lymphocytes Relative: 10 %
Lymphs Abs: 0.7 10*3/uL (ref 0.7–4.0)
MCH: 28.7 pg (ref 26.0–34.0)
MCHC: 33 g/dL (ref 30.0–36.0)
MCV: 86.9 fL (ref 80.0–100.0)
Monocytes Absolute: 0.5 10*3/uL (ref 0.1–1.0)
Monocytes Relative: 8 %
Neutro Abs: 5.6 10*3/uL (ref 1.7–7.7)
Neutrophils Relative %: 80 %
Platelets: 172 10*3/uL (ref 150–400)
RBC: 5.05 MIL/uL (ref 4.22–5.81)
RDW: 16.4 % — ABNORMAL HIGH (ref 11.5–15.5)
WBC: 7.1 10*3/uL (ref 4.0–10.5)
nRBC: 0 % (ref 0.0–0.2)

## 2018-11-30 LAB — URINALYSIS, ROUTINE W REFLEX MICROSCOPIC
Bacteria, UA: NONE SEEN
Bilirubin Urine: NEGATIVE
Glucose, UA: >=500 mg/dL — AB
Ketones, ur: NEGATIVE mg/dL
Leukocytes,Ua: NEGATIVE
Nitrite: NEGATIVE
Protein, ur: 100 mg/dL — AB
Specific Gravity, Urine: 1.013 (ref 1.005–1.030)
pH: 5 (ref 5.0–8.0)

## 2018-11-30 MED ORDER — IOHEXOL 300 MG/ML  SOLN
100.0000 mL | Freq: Once | INTRAMUSCULAR | Status: AC | PRN
  Administered 2018-11-30: 01:00:00 100 mL via INTRAVENOUS
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
## 2018-11-30 NOTE — Discharge Instructions (Addendum)
°  SEEK IMMEDIATE MEDICAL ATTENTION IF: °The pain does not go away or becomes severe, particularly over the next 8-12 hours.  °A temperature above 100.4F develops.  °Repeated vomiting occurs (multiple episodes).  ° °Blood is being passed in stools or vomit (bright red or black tarry stools).  °Return also if you develop chest pain, difficulty breathing, dizziness or fainting, or become confused, poorly responsive, or inconsolable. ° °

## 2018-12-01 ENCOUNTER — Telehealth: Payer: Self-pay | Admitting: Orthopedic Surgery

## 2018-12-01 ENCOUNTER — Encounter (INDEPENDENT_AMBULATORY_CARE_PROVIDER_SITE_OTHER): Payer: Self-pay | Admitting: *Deleted

## 2018-12-01 ENCOUNTER — Ambulatory Visit: Payer: Medicare HMO | Admitting: Orthopedic Surgery

## 2018-12-01 ENCOUNTER — Other Ambulatory Visit: Payer: Self-pay

## 2018-12-01 ENCOUNTER — Telehealth: Payer: Self-pay | Admitting: Family Medicine

## 2018-12-01 ENCOUNTER — Other Ambulatory Visit: Payer: Self-pay | Admitting: Orthopedic Surgery

## 2018-12-01 ENCOUNTER — Encounter: Payer: Self-pay | Admitting: Orthopedic Surgery

## 2018-12-01 ENCOUNTER — Ambulatory Visit (INDEPENDENT_AMBULATORY_CARE_PROVIDER_SITE_OTHER): Payer: Medicare HMO

## 2018-12-01 VITALS — BP 123/71 | HR 87 | Temp 96.3°F | Ht 68.0 in | Wt 127.0 lb

## 2018-12-01 DIAGNOSIS — S42202A Unspecified fracture of upper end of left humerus, initial encounter for closed fracture: Secondary | ICD-10-CM | POA: Diagnosis not present

## 2018-12-01 DIAGNOSIS — S42292A Other displaced fracture of upper end of left humerus, initial encounter for closed fracture: Secondary | ICD-10-CM

## 2018-12-01 MED ORDER — TRAMADOL-ACETAMINOPHEN 37.5-325 MG PO TABS: 1.0000 | ORAL_TABLET | ORAL | 0 refills | Status: DC | PRN

## 2018-12-01 MED ORDER — TRAMADOL HCL 50 MG PO TABS: 50.0000 mg | ORAL_TABLET | Freq: Four times a day (QID) | ORAL | 0 refills | Status: AC | PRN

## 2018-12-01 NOTE — Patient Instructions (Signed)
Wear sling, may remove for bathing   Return in 2 weeks for xrays

## 2018-12-01 NOTE — Progress Notes (Signed)
Chief Complaint  Patient presents with  . Fall    11/23/18 fell in bathtub   . Shoulder Pain    left    New problem first visit for this 78 year old male who injured his left shoulder falling in the bathtub on April 19 8 days ago.  He complains of constant pain and moderate discomfort difficulty moving his arm for the last 8 days.  He was initially treated in a sling after emergency room visit April 19 I recommend he follow-up now for x-ray.  Review of Systems  Musculoskeletal: Positive for joint pain.  Skin:       Left arm is ecchymotic lower two thirds of the arm and elbow  Neurological: Negative for tingling.    Past Medical History:  Diagnosis Date  . Aortic stenosis, mild   . BPH (benign prostatic hypertrophy)   . CAD (coronary artery disease)   . 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 Constipation   . Diabetes mellitus   . GERD (gastroesophageal reflux disease)   . History of cataract   . Hyperlipidemia   . Hypertension   . Nephrolithiasis   . Vitamin D deficiency    BP 123/71   Pulse 87   Ht 5\' 8"  (1.727 m)   Wt 127 lb (57.6 kg)   BMI 19.31 kg/m  Physical Exam Vitals signs and nursing note reviewed.  Constitutional:      Appearance: Normal appearance.  Musculoskeletal:       Arms:  Neurological:     Mental Status: He is alert and oriented to person, place, and time.  Psychiatric:        Mood and Affect: Mood normal.    Initial plain film showed cortical disruption proximal humerus nondisplaced fracture  X-ray was repeated today in the office for determination of further management  Today's x-ray nondisplaced fracture proximal humerus   Encounter Diagnosis  Name Primary?  . Closed fracture of proximal end of left humerus, unspecified fracture morphology, initial encounter Yes    Plan shoulder sling  Return for x-rays in 2 weeks

## 2018-12-01 NOTE — Telephone Encounter (Signed)
Can you change Ultracet to Tramadol, since he can not have tylenol

## 2018-12-01 NOTE — Telephone Encounter (Signed)
The pt is aware we will contact him in a few weeks to set him up for EUS.  Referring also notified

## 2018-12-01 NOTE — Telephone Encounter (Signed)
Patient's wife called in stating that patient saw Dr. Aline Brochure on today for a fracture and was prescribed Tramadol for pain. Patient has concerns due to liver cancer. Patient would like to know if you could prescribe something else

## 2018-12-01 NOTE — Telephone Encounter (Signed)
Patient and daughter called regarding medication prescribed today; states at [facility] now and that the medication contains Tylenol. States due to liver cancer, unable to take medication with Tylenol.  Able to get a different medication, possibly Tramadol? Please advise.

## 2018-12-01 NOTE — Telephone Encounter (Signed)
Also asking about the over the counter medication with blue or purple top that Dr Aline Brochure said he can take?  What is the name of this medication as he wants to get that as well.

## 2018-12-02 ENCOUNTER — Other Ambulatory Visit (HOSPITAL_COMMUNITY): Payer: Self-pay | Admitting: Interventional Radiology

## 2018-12-02 ENCOUNTER — Encounter: Payer: Self-pay | Admitting: General Practice

## 2018-12-02 DIAGNOSIS — C220 Liver cell carcinoma: Secondary | ICD-10-CM

## 2018-12-02 NOTE — Progress Notes (Signed)
[facility] Psychosocial Distress Screening Clinical Social Work  Clinical Social Work was referred by distress screening protocol.  The patient scored a 8 on the Psychosocial Distress Thermometer which indicates moderate distress. Clinical Social Worker contacted patient by phone to assess for distress and other psychosocial needs. Unable to reach patient, left VM w my contact information, description of support services available and encouragement to call back for support/resources.   ONCBCN DISTRESS SCREENING 11/24/2018  Screening Type Initial Screening  Distress experienced in past week (1-10) 8  Information Concerns Type Lack of info about diagnosis  Physician notified of physical symptoms Yes  Referral to clinical psychology No  Referral to clinical social work No  Referral to dietition No  Referral to financial advocate No  Referral to support programs No  Referral to palliative care No     Clinical Social Worker follow up needed: No.  If yes, follow up plan:  Beverely Pace, [place], LCSW Clinical Social Worker Phone:  [phone]

## 2018-12-02 NOTE — Telephone Encounter (Signed)
Tramadol should be fine to take.  This should not impact his liver cancer at all.  I would only change it if it is not sufficient for his pain

## 2018-12-02 NOTE — Telephone Encounter (Signed)
Spoke with patient and informed him of Dr.Pickard's recommendations. Patient verbalized understanding.

## 2018-12-02 NOTE — Telephone Encounter (Signed)
FYI Dr. Laural Golden.

## 2018-12-03 ENCOUNTER — Encounter (HOSPITAL_COMMUNITY)
Admission: RE | Admit: 2018-12-03 | Discharge: 2018-12-03 | Disposition: A | Discharge status: Home / Self Care | Payer: Medicare HMO | Source: Ambulatory Visit | Attending: Hematology | Admitting: Hematology

## 2018-12-03 ENCOUNTER — Other Ambulatory Visit: Payer: Self-pay

## 2018-12-03 ENCOUNTER — Encounter (HOSPITAL_COMMUNITY): Payer: Self-pay

## 2018-12-03 DIAGNOSIS — K769 Liver disease, unspecified: Secondary | ICD-10-CM

## 2018-12-03 DIAGNOSIS — C220 Liver cell carcinoma: Secondary | ICD-10-CM | POA: Diagnosis not present

## 2018-12-03 MED ORDER — TECHNETIUM TC 99M MEDRONATE IV KIT
20.0000 | PACK | Freq: Once | INTRAVENOUS | Status: AC | PRN
  Administered 2018-12-03: 10:00:00 20 via INTRAVENOUS

## 2018-12-07 ENCOUNTER — Telehealth: Payer: Self-pay | Admitting: Gastroenterology

## 2018-12-07 NOTE — Telephone Encounter (Signed)
[name], This man was referred to me about a month ago to look in his common duct with EUS because bile duct stone could not be excluded on previous imaging.  Since then he has been diagnosed with a liver cancer.  Do you still want the endoscopic ultrasound?  We had postponed it temporarily due to the pandemic but I am revisiting this question now.

## 2018-12-08 ENCOUNTER — Other Ambulatory Visit: Payer: Self-pay | Admitting: Interventional Radiology

## 2018-12-08 DIAGNOSIS — C220 Liver cell carcinoma: Secondary | ICD-10-CM

## 2018-12-08 NOTE — Telephone Encounter (Signed)
Dr. Ardis Hughs,   Patient actually belongs to Dr. Hildred Laser, we saw him while covering inpatient for him.  Referral for EUS for dilatation of main pancreatic duct and to assess biliary tree for CBD stone.   I will share this information with Dr. Laural Golden and allow him to decide if he would like for EUS to be pursued.   Magda Paganini

## 2018-12-09 ENCOUNTER — Ambulatory Visit (HOSPITAL_COMMUNITY)
Admission: RE | Admit: 2018-12-09 | Discharge: 2018-12-09 | Disposition: A | Discharge status: Home / Self Care | Payer: Medicare HMO | Source: Ambulatory Visit | Attending: Interventional Radiology | Admitting: Interventional Radiology

## 2018-12-09 ENCOUNTER — Inpatient Hospital Stay (HOSPITAL_COMMUNITY): Payer: Medicare HMO | Attending: Hematology | Admitting: Hematology

## 2018-12-09 ENCOUNTER — Encounter (HOSPITAL_COMMUNITY): Payer: Self-pay | Admitting: Hematology

## 2018-12-09 ENCOUNTER — Other Ambulatory Visit: Payer: Self-pay

## 2018-12-09 VITALS — BP 108/81 | HR 107 | Temp 97.4°F | Resp 16 | Wt 126.6 lb

## 2018-12-09 DIAGNOSIS — I119 Hypertensive heart disease without heart failure: Secondary | ICD-10-CM | POA: Diagnosis not present

## 2018-12-09 DIAGNOSIS — K8689 Other specified diseases of pancreas: Secondary | ICD-10-CM | POA: Diagnosis not present

## 2018-12-09 DIAGNOSIS — Z87891 Personal history of nicotine dependence: Secondary | ICD-10-CM | POA: Insufficient documentation

## 2018-12-09 DIAGNOSIS — C220 Liver cell carcinoma: Secondary | ICD-10-CM | POA: Insufficient documentation

## 2018-12-09 DIAGNOSIS — I10 Essential (primary) hypertension: Secondary | ICD-10-CM | POA: Insufficient documentation

## 2018-12-09 DIAGNOSIS — E119 Type 2 diabetes mellitus without complications: Secondary | ICD-10-CM

## 2018-12-09 DIAGNOSIS — I2510 Atherosclerotic heart disease of native coronary artery without angina pectoris: Secondary | ICD-10-CM | POA: Insufficient documentation

## 2018-12-09 DIAGNOSIS — K8020 Calculus of gallbladder without cholecystitis without obstruction: Secondary | ICD-10-CM | POA: Diagnosis not present

## 2018-12-09 DIAGNOSIS — K5900 Constipation, unspecified: Secondary | ICD-10-CM | POA: Insufficient documentation

## 2018-12-09 DIAGNOSIS — I700 Atherosclerosis of aorta: Secondary | ICD-10-CM | POA: Diagnosis not present

## 2018-12-09 DIAGNOSIS — C227 Other specified carcinomas of liver: Secondary | ICD-10-CM | POA: Diagnosis not present

## 2018-12-09 MED ORDER — IOHEXOL 350 MG/ML SOLN
100.0000 mL | Freq: Once | INTRAVENOUS | Status: AC | PRN
  Administered 2018-12-09: 100 mL via INTRAVENOUS

## 2018-12-09 NOTE — Assessment & Plan Note (Signed)
1.  Hepatocellular carcinoma: - He was recently admitted to the hospital with epigastric pain, CT scan of the abdomen showed liver mass.  No other metastatic disease noted. -MRI of the liver on 11/11/2018 shows nodular liver contour with enlargement of the lateral segment of the left liver, features suggesting cirrhosis.  Early ill-defined hyperenhancement involving segment 4 suggesting ill-defined lesion, measuring 6.3 x 4.5 cm.  Lesion difficult to assess given motion degradation on postcontrast imaging but infiltrative HCC cannot be excluded. -AFP was elevated at 1451.  He does not have any traditional risk factors for cirrhosis including hepatitis and alcoholism. -He was evaluated by Dr. Laurence Ferrari from IR who recommended bland embolization. -Patient had a history of fall on 11/23/2018.  He is wearing a sling to the left shoulder. -I reviewed results of the bone scan dated 12/03/2018.  Uptake in the left lower ribs.  Transverse fracture at the left humerus consistent with fracture.  Uptake adjacent to the acetabular cup of the left hip prosthesis consistent with cup loosening.  No evidence of metastatic disease. -He reports having dysphagia.  We will make a referral to Dr. Laural Golden. -I will see him back in 6 weeks for follow-up.  He has an appointment with radiology for a CT scan of the abdomen. -He has a follow-up with IR on 12/16/2018.

## 2018-12-09 NOTE — Patient Instructions (Addendum)
[place] at [facility] Discharge Instructions  You were seen today by Dr. Delton Coombes. He went over your recent lab and scan results. He will see you back in 6 weeks for labs and follow up.   Thank you for choosing [place] at [facility] to provide your oncology and hematology care.  To afford each patient quality time with our provider, please arrive at least 15 minutes before your scheduled appointment time.   If you have a lab appointment with the [facility] please come in thru the  Main Entrance and check in at the main information desk  You need to re-schedule your appointment should you arrive 10 or more minutes late.  We strive to give you quality time with our providers, and arriving late affects you and other patients whose appointments are after yours.  Also, if you no show three or more times for appointments you may be dismissed from the clinic at the providers discretion.     Again, thank you for choosing [facility].  Our hope is that these requests will decrease the amount of time that you wait before being seen by our physicians.       _____________________________________________________________  Should you have questions after your visit to [facility], please contact our office at [phone] between the hours of 8:00 a.m. and 4:30 p.m.  Voicemails left after 4:00 p.m. will not be returned until the following business day.  For prescription refill requests, have your pharmacy contact our office and allow 72 hours.    Cancer Center Support Programs:   > Cancer Support Group  2nd Tuesday of the month 1pm-2pm, Journey Room
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
## 2018-12-10 ENCOUNTER — Telehealth: Payer: Self-pay | Admitting: Gastroenterology

## 2018-12-10 NOTE — Telephone Encounter (Signed)
I have been getting six 55min spots in the AM and six 71min spots in the PM.  My template for the 10AU is all messed up and only allows me to see 9 patients that full day instead of 12 (6 in AM and 6 in PM).  Please see if Remo Lipps can fix the template for that day.  Thanks

## 2018-12-10 NOTE — Telephone Encounter (Signed)
[name] has the template with 30 minute spots.  Not sure if we can change it?  I know she has asked [place] to schedule all virtual/ telehealth appointments for 30 minutes.

## 2018-12-10 NOTE — Telephone Encounter (Signed)
I scheduled telephone visit for 5/18.  Pt aware- Dr [name] is this too far out?

## 2018-12-10 NOTE — Telephone Encounter (Addendum)
Yes, it looks like I have a full day of clinic on Wednesday the 13th.  Not sure why I'm only scheduled to see 8 or 9 patients right now.  Can you look into that and offer him a spot that day?

## 2018-12-10 NOTE — Telephone Encounter (Signed)
I spoke with Dr. Laural Golden this afternoon.  They had initially requested endoscopic ultrasound evaluation for dilation of main pancreatic duct and to assess the biliary tree for CBD stones.  Since then he has been diagnosed with liver cancer.   Patty, Can you please reach out to him, his wife.  I would like to meet him for a telemedicine visit my first available appointment to discuss the above.  Thank you

## 2018-12-11 NOTE — Telephone Encounter (Signed)
Talked with patient and his wife yesterday and discussed with Dr. Ardis Hughs. It was decided to arrange telemetry visit with Dr. Ardis Hughs before final decision made about doing pancreatic EUS. Dr. Ardis Hughs office will contact patient and his wife.

## 2018-12-11 NOTE — Telephone Encounter (Signed)
We are working on getting the schedule corrected.  The template has been corrected and Claiborne Billings will work on getting patients on for that day.

## 2018-12-11 NOTE — Telephone Encounter (Signed)
Discussed with Dr. Ardis Hughs yesterday. Diagnosis of [place] I am not sure whether we should proceed with pancreatic EUS. Dr. Ardis Hughs to do tele-visit with patient before final decision made.

## 2018-12-11 NOTE — Telephone Encounter (Signed)
Spouse called back and confirmed appointment on 12-17-2018 at 2pm.

## 2018-12-11 NOTE — Telephone Encounter (Signed)
12/17/18 at 2 pm appt moved and pt notified

## 2018-12-15 ENCOUNTER — Ambulatory Visit (INDEPENDENT_AMBULATORY_CARE_PROVIDER_SITE_OTHER): Payer: Medicare HMO

## 2018-12-15 ENCOUNTER — Ambulatory Visit (INDEPENDENT_AMBULATORY_CARE_PROVIDER_SITE_OTHER): Payer: Medicare HMO | Admitting: Orthopedic Surgery

## 2018-12-15 ENCOUNTER — Encounter: Payer: Self-pay | Admitting: Orthopedic Surgery

## 2018-12-15 ENCOUNTER — Ambulatory Visit (INDEPENDENT_AMBULATORY_CARE_PROVIDER_SITE_OTHER): Payer: Medicare HMO | Admitting: Internal Medicine

## 2018-12-15 ENCOUNTER — Other Ambulatory Visit: Payer: Self-pay

## 2018-12-15 ENCOUNTER — Other Ambulatory Visit: Payer: Self-pay | Admitting: Radiology

## 2018-12-15 VITALS — Temp 95.9°F

## 2018-12-15 DIAGNOSIS — S42295D Other nondisplaced fracture of upper end of left humerus, subsequent encounter for fracture with routine healing: Secondary | ICD-10-CM

## 2018-12-15 NOTE — Patient Instructions (Signed)
Start OT at [facility] outpatient   Remove sling   Return in 8 weeks

## 2018-12-15 NOTE — Progress Notes (Signed)
Chief Complaint  Patient presents with  . Shoulder Injury    left shoulder injury fracture 11/23/18   78 year old male who injured his left shoulder falling in the bathtub on April 19   Treatment shoulder sling  " it still hurts"  X-ray shows no displacement minimal angulation, fracture looks healed at 22 days.  Physical Exam Musculoskeletal:       Arms:        OT, at [facility] outpatient  Fu 8 weeks

## 2018-12-15 NOTE — Addendum Note (Signed)
Addended byCandice Camp on: 12/15/2018 10:23 AM   Modules accepted: Orders

## 2018-12-16 ENCOUNTER — Encounter (HOSPITAL_COMMUNITY): Payer: Self-pay

## 2018-12-16 ENCOUNTER — Telehealth: Payer: Self-pay | Admitting: Radiology

## 2018-12-16 ENCOUNTER — Encounter (HOSPITAL_COMMUNITY)
Admission: RE | Admit: 2018-12-16 | Discharge: 2018-12-16 | Disposition: A | Discharge status: Home / Self Care | Payer: Medicare HMO | Source: Ambulatory Visit | Attending: Interventional Radiology | Admitting: Interventional Radiology

## 2018-12-16 ENCOUNTER — Encounter (HOSPITAL_COMMUNITY): Payer: Medicare HMO

## 2018-12-16 ENCOUNTER — Ambulatory Visit (HOSPITAL_COMMUNITY)
Admission: RE | Admit: 2018-12-16 | Discharge: 2018-12-16 | Disposition: A | Discharge status: Home / Self Care | Payer: Medicare HMO | Source: Ambulatory Visit | Attending: Interventional Radiology | Admitting: Interventional Radiology

## 2018-12-16 ENCOUNTER — Telehealth: Payer: Self-pay | Admitting: Family Medicine

## 2018-12-16 ENCOUNTER — Ambulatory Visit (INDEPENDENT_AMBULATORY_CARE_PROVIDER_SITE_OTHER): Payer: Medicare HMO | Admitting: Internal Medicine

## 2018-12-16 DIAGNOSIS — Z538 Procedure and treatment not carried out for other reasons: Secondary | ICD-10-CM | POA: Diagnosis not present

## 2018-12-16 DIAGNOSIS — C220 Liver cell carcinoma: Secondary | ICD-10-CM | POA: Insufficient documentation

## 2018-12-16 DIAGNOSIS — S42295D Other nondisplaced fracture of upper end of left humerus, subsequent encounter for fracture with routine healing: Secondary | ICD-10-CM

## 2018-12-16 LAB — COMPREHENSIVE METABOLIC PANEL
ALT: 153 U/L — ABNORMAL HIGH (ref 0–44)
AST: 123 U/L — ABNORMAL HIGH (ref 15–41)
Albumin: 3.6 g/dL (ref 3.5–5.0)
Alkaline Phosphatase: 923 U/L — ABNORMAL HIGH (ref 38–126)
Anion gap: 15 (ref 5–15)
BUN: 16 mg/dL (ref 8–23)
CO2: 25 mmol/L (ref 22–32)
Calcium: 9.5 mg/dL (ref 8.9–10.3)
Chloride: 94 mmol/L — ABNORMAL LOW (ref 98–111)
Creatinine, Ser: 0.91 mg/dL (ref 0.61–1.24)
GFR calc Af Amer: >60 mL/min (ref >60)
GFR calc non Af Amer: >60 mL/min (ref >60)
Glucose, Bld: 176 mg/dL — ABNORMAL HIGH (ref 70–99)
Potassium: 3.1 mmol/L — ABNORMAL LOW (ref 3.5–5.1)
Sodium: 134 mmol/L — ABNORMAL LOW (ref 135–145)
Total Bilirubin: 2.1 mg/dL — ABNORMAL HIGH (ref 0.3–1.2)
Total Protein: 7.5 g/dL (ref 6.5–8.1)

## 2018-12-16 LAB — CBC WITH DIFFERENTIAL/PLATELET
Abs Immature Granulocytes: 0.04 10*3/uL (ref 0.00–0.07)
Basophils Absolute: 0.1 10*3/uL (ref 0.0–0.1)
Basophils Relative: 1 %
Eosinophils Absolute: 0.3 10*3/uL (ref 0.0–0.5)
Eosinophils Relative: 3 %
HCT: 47.7 % (ref 39.0–52.0)
Hemoglobin: 15.1 g/dL (ref 13.0–17.0)
Immature Granulocytes: 0 %
Lymphocytes Relative: 24 %
Lymphs Abs: 2.4 10*3/uL (ref 0.7–4.0)
MCH: 29.1 pg (ref 26.0–34.0)
MCHC: 31.7 g/dL (ref 30.0–36.0)
MCV: 91.9 fL (ref 80.0–100.0)
Monocytes Absolute: 0.7 10*3/uL (ref 0.1–1.0)
Monocytes Relative: 7 %
Neutro Abs: 6.5 10*3/uL (ref 1.7–7.7)
Neutrophils Relative %: 65 %
Platelets: 213 10*3/uL (ref 150–400)
RBC: 5.19 MIL/uL (ref 4.22–5.81)
RDW: 16.7 % — ABNORMAL HIGH (ref 11.5–15.5)
WBC: 10 10*3/uL (ref 4.0–10.5)
nRBC: 0 % (ref 0.0–0.2)

## 2018-12-16 LAB — PROTIME-INR
INR: 1.1 (ref 0.8–1.2)
Prothrombin Time: 14.4 seconds (ref 11.4–15.2)

## 2018-12-16 LAB — GLUCOSE, CAPILLARY: Glucose-Capillary: 168 mg/dL — ABNORMAL HIGH (ref 70–99)

## 2018-12-16 MED ORDER — BASAGLAR KWIKPEN 100 UNIT/ML ~~LOC~~ SOPN: 36.0000 [IU] | PEN_INJECTOR | Freq: Every day | SUBCUTANEOUS | 3 refills | Status: DC

## 2018-12-16 MED ORDER — SODIUM CHLORIDE 0.9 % IV SOLN
INTRAVENOUS | Status: DC
  Administered 2018-12-16: 08:00:00 via INTRAVENOUS

## 2018-12-16 NOTE — Telephone Encounter (Signed)
Medication called/sent to requested pharmacy  

## 2018-12-16 NOTE — Discharge Instructions (Signed)
Please begin eating Potassium rich foods:   Milk and soy milk.  Fruits, such as bananas, papaya, apricots, nectarines, melon, prunes, raisins, kiwi, and oranges.  Vegetables, such as potatoes, sweet potatoes, yams, tomatoes, leafy greens, beets, okra, avocado, pumpkin, and winter squash.  White and lima beans.

## 2018-12-16 NOTE — Telephone Encounter (Signed)
-----   Message from Chryl Heck, OT sent at 12/16/2018  2:05 PM EDT ----- Hi Cristle Jared,   We received a referral for this patient at [facility] however he is a high score for the covid complication risk screening therefore we are unable see him in the clinic at this time. We offered telehealth visits however his daughter declined and said he could not do that. He will need a referral to home health services for rehab.    Thank you,   Guadelupe Sabin, OTR/L

## 2018-12-16 NOTE — Telephone Encounter (Signed)
Pt needs basaglar sent to [facility]

## 2018-12-16 NOTE — Progress Notes (Signed)
Patient ID: [name], male   DOB: May 01, 1941, 78 y.o.   MRN: [id number] Patient presented to IR department today for pre-Y 90 hepatic/visceral arteriogram with possible embolization secondary to imaging findings consistent with hepatocellular carcinoma.  CMP drawn today revealed rising LFT's with total bilirubin of 2.1.  Total bilirubin 1.2   2 weeks ago.  Findings discussed with Dr. Pascal Lux and procedure canceled.  Patient /daughter made aware of findings.  Will contact Dr. Delton Coombes (heme/onc) to determine next plan of care.

## 2018-12-16 NOTE — Progress Notes (Signed)
Pt procedure cancelled, per Lennette Bihari and Dr. Pascal Lux. Family notified and Dr. Pascal Lux had extensive conversation with them. All questions answered. Pt had family to drive him home.

## 2018-12-17 ENCOUNTER — Ambulatory Visit (INDEPENDENT_AMBULATORY_CARE_PROVIDER_SITE_OTHER): Payer: Medicare HMO | Admitting: Gastroenterology

## 2018-12-17 ENCOUNTER — Other Ambulatory Visit: Payer: Self-pay | Admitting: Family Medicine

## 2018-12-17 ENCOUNTER — Encounter: Payer: Self-pay | Admitting: Gastroenterology

## 2018-12-17 ENCOUNTER — Other Ambulatory Visit: Payer: Self-pay

## 2018-12-17 ENCOUNTER — Encounter (HOSPITAL_COMMUNITY): Payer: Self-pay

## 2018-12-17 ENCOUNTER — Ambulatory Visit (HOSPITAL_COMMUNITY): Admit: 2018-12-17 | Payer: Medicare HMO | Admitting: Gastroenterology

## 2018-12-17 VITALS — Ht 68.0 in | Wt 126.0 lb

## 2018-12-17 DIAGNOSIS — K859 Acute pancreatitis without necrosis or infection, unspecified: Secondary | ICD-10-CM

## 2018-12-17 DIAGNOSIS — R945 Abnormal results of liver function studies: Secondary | ICD-10-CM | POA: Diagnosis not present

## 2018-12-17 DIAGNOSIS — K8020 Calculus of gallbladder without cholecystitis without obstruction: Secondary | ICD-10-CM

## 2018-12-17 DIAGNOSIS — R7989 Other specified abnormal findings of blood chemistry: Secondary | ICD-10-CM

## 2018-12-17 SURGERY — UPPER ENDOSCOPIC ULTRASOUND (EUS) RADIAL
Anesthesia: Monitor Anesthesia Care
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
## 2018-12-17 NOTE — Patient Instructions (Addendum)
We will arrange endoscopic ultrasound +/- ERCP in the same setting for next week.  Please go to [facility] on Friday May 15th between 9-3 am go to the eduactional center for your Covid-19 screening test. You will be notified of the results with in 72 hours  He and his wife understand that if he starts to get very sick prior to then they need to present to their local emergency room I specifically mentioned fevers, serious abdominal pains, significant vomiting.

## 2018-12-17 NOTE — Progress Notes (Signed)
GI Location of Tumor / Histology: Hepatocellular carcinoma  [name] presented to the hospital in April 2020 with mild acute pancreatitis  Plan: PET, ? Radiation vs alternative, Radiation: 5 or 8 treatments,   CT Abd/Pelvis 11/30/2018:  There is a central 2.5 cm low-density lesion which is unchanged since prior study.  MRI liver 11/11/2018: nodular liver contour with enlargement of the lateral segment of the left liver, features suggesting cirrhosis.  Early ill defined hyper-enhancement involving segment 4 suggesting ill defined lesion, measuring 6.3 x 4.5 cm.    CT abdomen/pelvis 11/11/2018: There is a retracted appearing region of the central right lobe of the liver with a hypodense internal lesion measuring approximately 2.7 by 1.5 cm.  Biopsies of   Past/Anticipated interventions by GI, if any: Dr. Ardis Hughs 12/17/2018 -During his hospital stay he was found to have a locally advanced hepatocellular cancer.  Since his hospital stay he has done poorly, he is not eating well and he has nausea/vomiting. -When he went in for possible bland embolization by interventional radiology his liver tests have been found to have bumped rather acutely.  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 - I am concerned that he might have common bile duct stone.  I do think he probably had gallstone related acute pancreatitis last month. -I recommended further evaluation with endoscopic ultrasound to be followed by ERCP if a bile duct stone is found. -If he does have acute worsening and his liver tests continue to clearly rise then I think proceeding straight to ERCP would be reasonable and that could possibly be done locally without transfer.   Past/Anticipated interventions by surgeon, if any:  Past/Anticipated interventions by medical oncology, if any:  Dr. Delton Coombes 12/09/2018 -AFP was elevated at 1451.  He does not have any traditional risk factors for  cirrhosis including hepatitis and alcoholism. -He was evaluated by Dr. Laurence Ferrari from IR who recommended bland emobilization.   Weight changes, if any: Per family he has lost a lot of weight.  Bowel/Bladder complaints, if any: Impacted, had enema today as well as miralax.  Nausea / Vomiting, if any: Gagging on food.  Pain issues, if any:  Left abdominal pain    SAFETY ISSUES:  Prior radiation? No  Pacemaker/ICD? No  Possible current pregnancy? No  Is the patient on methotrexate? No   Current Complaints/Details:

## 2018-12-18 ENCOUNTER — Encounter (INDEPENDENT_AMBULATORY_CARE_PROVIDER_SITE_OTHER): Payer: Self-pay | Admitting: Internal Medicine

## 2018-12-18 ENCOUNTER — Encounter: Payer: Self-pay | Admitting: Radiation Oncology

## 2018-12-18 ENCOUNTER — Ambulatory Visit
Admission: RE | Admit: 2018-12-18 | Discharge: 2018-12-18 | Disposition: A | Discharge status: Home / Self Care | Payer: Medicare HMO | Source: Ambulatory Visit | Attending: Radiation Oncology | Admitting: Radiation Oncology

## 2018-12-18 ENCOUNTER — Other Ambulatory Visit: Payer: Self-pay

## 2018-12-18 ENCOUNTER — Ambulatory Visit (INDEPENDENT_AMBULATORY_CARE_PROVIDER_SITE_OTHER): Payer: Medicare HMO | Admitting: Internal Medicine

## 2018-12-18 VITALS — BP 119/73 | HR 93 | Temp 97.7°F | Resp 18 | Ht 66.0 in | Wt 120.5 lb

## 2018-12-18 VITALS — Ht 66.0 in | Wt 120.5 lb

## 2018-12-18 DIAGNOSIS — R945 Abnormal results of liver function studies: Secondary | ICD-10-CM

## 2018-12-18 DIAGNOSIS — R7989 Other specified abnormal findings of blood chemistry: Secondary | ICD-10-CM

## 2018-12-18 DIAGNOSIS — C220 Liver cell carcinoma: Secondary | ICD-10-CM | POA: Diagnosis not present

## 2018-12-18 DIAGNOSIS — E876 Hypokalemia: Secondary | ICD-10-CM | POA: Diagnosis not present

## 2018-12-18 DIAGNOSIS — K5641 Fecal impaction: Secondary | ICD-10-CM | POA: Diagnosis not present

## 2018-12-18 DIAGNOSIS — R69 Illness, unspecified: Secondary | ICD-10-CM | POA: Diagnosis not present

## 2018-12-18 MED ORDER — POTASSIUM CHLORIDE CRYS ER 20 MEQ PO TBCR: 20.0000 meq | EXTENDED_RELEASE_TABLET | Freq: Every day | ORAL | 1 refills | Status: DC

## 2018-12-18 MED ORDER — POLYETHYLENE GLYCOL 3350 17 GM/SCOOP PO POWD: 17.0000 g | Freq: Every day | ORAL | 3 refills | Status: AC

## 2018-12-18 NOTE — Patient Instructions (Signed)
Fleet enema x2 as directed. Polyethylene glycol 17 g x 2 today and thereafter 8.5 to 17 g daily. Please call with progress report on 12/19/2018. Call the hospital operator at [phone] and they will page Dr. Laural Golden

## 2018-12-18 NOTE — Progress Notes (Signed)
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 11/23/2018 19:59   [facility] Radiologist Eval & Mgmt  Result Date: 11/19/2018 Please refer to notes tab for details about interventional procedure. (Op Note)      IMPRESSION/PLAN: 1. Hepatocellular Carcinoma. Dr. Lisbeth Renshaw discusses the pathology findings and reviews the nature of hepatocellular carcinoma. Dr. Lisbeth Renshaw discussed the options to proceed with possible chemoembolization with Dr. Laurence Ferrari which we believe is still being considered. If he is not a candidate stereotactic body radiotherapy could be considered. SBRT could also be considered at a later date if there was persistent disease following embolization or if it grew despite embolization. If we were to proceed with SBRT instead of chemoembolization, Dr. Lisbeth Renshaw would like to obtain a PET scan for extent of disease and for treatment planning purposes. We discussed the risks, benefits, short, and long term effects of radiotherapy. Dr. Lisbeth Renshaw discusses the delivery and logistics of radiotherapy and anticipates a course of 5 fractions of stereotactic body radiotherapy. We will follow up with the decision making with GI, IR, and medical oncology prior to making any definitive treatment  plans.   This encounter was provided by telemedicine platform Webex.  The patient has given verbal consent for this type of encounter and has been advised to only accept a meeting of this type in a secure network environment. The time spent during this encounter was 60 minutes. The attendants for this meeting include Blenda Nicely, RN, Dr. Lisbeth Renshaw, Hayden Pedro  and Katheren Shams, though his wife [name] and daughter [name] were the only on the call we could visualize.  During the encounter,  Blenda Nicely, RN, Dr. Lisbeth Renshaw, and Hayden Pedro were located at [facility] Radiation Oncology Department.  Katheren Shams and his daughter [name] and wife [name] were located at home.   The above documentation reflects my direct findings during this shared patient visit. Please see the separate note by Dr. Lisbeth Renshaw on this date for the remainder of the patient's plan of care.    Bryson Ha  Margurite Auerbach, PAC

## 2018-12-18 NOTE — Progress Notes (Signed)
Presenting complaint;  Abdominal pain nausea and vomiting.  Database and subjective:  This is unscheduled visit. Patient is 78 year old Caucasian male who was admitted to [facility] about 5 weeks ago with acute pancreatitis.  He was suspected to have biliary pancreatitis but MRCP did not reveal any stones in the bile duct.  His lipase on admission was over 1400 and was down to 44 by the time he was discharged.  Imaging studies unfortunately also showed a large hepatic lesion.  Alpha-fetoprotein was 1451.  Even though imaging characteristics did not confirm hepatocellular carcinoma given very high alpha-fetoprotein it was felt that he had [place] in the background of cirrhosis. He has seen Dr. Jacqulynn Cadet of IR and is scheduled for TACE later this month. Since there is been concern for choledocholithiasis I requested Dr. Ardis Hughs input.  He did tell a visit with the patient and his family yesterday and patient is scheduled to undergo EUS followed by ERCP if he is found to have choledocholithiasis.  Patient showed up in the office this morning complaining that he is not able solid food.  He is able to tolerate clear liquids and full liquids when he tries to eat solids he has nausea vomiting and heaving.  He also complains of pain across his upper abdomen as well as lower abdomen.  He has not had fever chills hematemesis or melena.  He says he has not been able to have good bowel movement for several days.  He states he is only passing small amount at a time. He has lost close to 30 pounds since his symptoms began about 6 weeks ago.   Current Medications: Outpatient Encounter Medications as of 12/18/2018  Medication Sig  . aspirin 81 MG EC tablet Take 1 tablet (81 mg total) by mouth daily. Swallow whole.  . Cholecalciferol (VITAMIN D) 2000 units tablet Take 1 tablet (2,000 Units total) by mouth daily.  . fish oil-omega-3 fatty acids 1000 MG capsule Take 1 g by mouth every evening.   Marland Kitchen glucose blood (ONE  TOUCH ULTRA TEST) test strip CHECK BLOOD SUGAR TWICE DAILY.PLEASE DISPENSE BASED ON INSURANCE PREFERENCE ICD 10 E11.9,Z79.4  . Insulin Glargine (BASAGLAR KWIKPEN) 100 UNIT/ML SOPN Inject 0.36 mLs (36 Units total) into the skin daily. Give number of units as directed at office visit, up to 30 units daily.  Marland Kitchen lisinopril (ZESTRIL) 2.5 MG tablet Take 2.5 mg by mouth daily.  . meclizine (ANTIVERT) 12.5 MG tablet Take 1 tablet (12.5 mg total) by mouth 3 (three) times daily as needed for dizziness.  . metoprolol succinate (TOPROL-XL) 50 MG 24 hr tablet TAKE 1 TABLET DAILY WITH ORIMMEDIATELY FOLLOWING A    MEAL  . omeprazole (PRILOSEC) 20 MG capsule Take 1 capsule (20 mg total) by mouth daily.  . ondansetron (ZOFRAN) 4 MG tablet TAKE 1 TABLET BY MOUTH EVERY 8 HOURS AS NEEDED FOR NAUSEA AND VOMITING  . pravastatin (PRAVACHOL) 40 MG tablet Take 40 mg by mouth daily.   No facility-administered encounter medications on file as of 12/18/2018.      Objective: Blood pressure 119/73, pulse 93, temperature 97.7 F (36.5 C), temperature source Oral, resp. rate 18, height 5' 6" (1.676 m), weight 120 lb 8 oz (54.7 kg). Well-developed thin Caucasian male who appears to be chronically ill but in no acute distress. Conjunctiva is pink. Sclera is nonicteric Oropharyngeal mucosa is normal. No neck masses or thyromegaly noted. Cardiac exam with regular rhythm normal S1 and S2. No murmur or gallop noted. Lungs are  clear to auscultation. Abdomen flat.  Bowel sounds are normal.  On palpation he has fullness in left lower quadrant which appears to be due to fecal matter in the sigmoid colon.  He does not have tenderness across his upper abdomen. Rectal examination was performed and he was noted to be impacted.  Stool was digitally broken up. He also had focal redness to skin at coccyx.  Patient reported he fell few days ago. No LE edema or clubbing noted.  Labs/studies Results:  CBC Latest Ref Rng & Units 12/16/2018  11/29/2018 11/12/2018  WBC 4.0 - 10.5 K/uL 10.0 7.1 5.1  Hemoglobin 13.0 - 17.0 g/dL 15.1 14.5 13.4  Hematocrit 39.0 - 52.0 % 47.7 43.9 42.7  Platelets 150 - 400 K/uL 213 172 134(L)    CMP Latest Ref Rng & Units 12/16/2018 11/29/2018 11/13/2018  Glucose 70 - 99 mg/dL 176(H) 205(H) 108(H)  BUN 8 - 23 mg/dL _0 Creatinine 0.61 - 1.24 mg/dL 0.91 1.16 0.90  Sodium 135 - 145 mmol/L 134(L) 134(L) 139  Potassium 3.5 - 5.1 mmol/L 3.1(L) 3.3(L) 3.4(L)  Chloride 98 - 111 mmol/L 94(L) 100 108  CO2 22 - 32 mmol/L _1 Calcium 8.9 - 10.3 mg/dL 9.5 9.5 8.7(L)  Total Protein 6.5 - 8.1 g/dL 7.5 7.0 6.2(L)  Total Bilirubin 0.3 - 1.2 mg/dL 2.1(H) 1.2 1.0  Alkaline Phos 38 - 126 U/L 923(H) 600(H) 392(H)  AST 15 - 41 U/L 123(H) 62(H) 35  ALT 0 - 44 U/L 153(H) 63(H) 47(H)    Hepatic Function Latest Ref Rng & Units 12/16/2018 11/29/2018 11/13/2018  Total Protein 6.5 - 8.1 g/dL 7.5 7.0 6.2(L)  Albumin 3.5 - 5.0 g/dL 3.6 3.4(L) 3.1(L)  AST 15 - 41 U/L 123(H) 62(H) 35  ALT 0 - 44 U/L 153(H) 63(H) 47(H)  Alk Phosphatase 38 - 126 U/L 923(H) 600(H) 392(H)  Total Bilirubin 0.3 - 1.2 mg/dL 2.1(H) 1.2 1.0  Bilirubin, Direct 0.0 - 0.3 mg/dL - - -     Assessment:  #1.  Fecal impaction.  Most of his acute symptoms may well be due to fecal impaction.  #2.  Hypokalemia.  It was 3.1 on blood work done 2 days ago.  #3.  Elevated transaminases with cholestatic pattern.  Significant bump in serum alkaline phosphatase and transaminases in 2-1/2 weeks.  Suspect this abnormality may be due to hepatocellular carcinoma but he certainly could have choledocholithiasis to account for some of this month.  #4.  Hepatocellular carcinoma.  He is scheduled for TACE later this month.   Plan:  Patient's condition was discussed with his daughter [name] over the phone.  She brought the patient to her office with could not come out because of ongoing COVID-19 pandemic issues. I recommended giving him fleets enema when he gets  home and repeating it within an hour or 2 hours. Will start polyethylene glycol 17 g twice daily today and thereafter 1 dose daily. Start him on KCl 20 mg p.o. daily. I asked patient daughter [name] to call tomorrow with a progress report.

## 2018-12-19 ENCOUNTER — Other Ambulatory Visit (HOSPITAL_COMMUNITY): Admission: RE | Admit: 2018-12-19 | Payer: Medicare HMO | Source: Ambulatory Visit

## 2018-12-19 ENCOUNTER — Other Ambulatory Visit: Payer: Medicare HMO

## 2018-12-19 ENCOUNTER — Other Ambulatory Visit (HOSPITAL_COMMUNITY): Payer: Medicare HMO

## 2018-12-19 ENCOUNTER — Telehealth: Payer: Self-pay | Admitting: Hematology

## 2018-12-19 DIAGNOSIS — Z20822 Contact with and (suspected) exposure to covid-19: Secondary | ICD-10-CM

## 2018-12-19 DIAGNOSIS — R6889 Other general symptoms and signs: Secondary | ICD-10-CM | POA: Diagnosis not present

## 2018-12-19 NOTE — Progress Notes (Signed)
Please have patient stop pravastatin and lisinopril due to elevated lfts and poor po intake.

## 2018-12-19 NOTE — Progress Notes (Signed)
Pt's wife aware to stop meds

## 2018-12-19 NOTE — Telephone Encounter (Signed)
Patient arrived at [facility] testing site.  Original order was put in for same day surgery.  Patient will have COVID testing at the [facility] since they have arrived.

## 2018-12-22 ENCOUNTER — Ambulatory Visit: Payer: Medicare HMO | Admitting: Gastroenterology

## 2018-12-22 ENCOUNTER — Telehealth: Payer: Self-pay | Admitting: Radiation Oncology

## 2018-12-22 ENCOUNTER — Encounter (HOSPITAL_COMMUNITY): Payer: Self-pay | Admitting: *Deleted

## 2018-12-22 DIAGNOSIS — C220 Liver cell carcinoma: Secondary | ICD-10-CM

## 2018-12-22 LAB — NOVEL CORONAVIRUS, NAA: SARS-CoV-2, NAA: NOT DETECTED

## 2018-12-22 NOTE — Telephone Encounter (Signed)
I called and let the patient's daughter know Dr. Laurence Ferrari said he is not a candidate for IR interventions at this time given his portal vein thrombus, and bilirubin. Dr. Lisbeth Renshaw has recommended a PET scan to better understand extent of disease and rule out metastatic disease. We will follow up with XRT options once the scan has been performed. She is in agreement. He is also going to have ERCP tomorrow and I encouraged her to discuss her concerns about the portal vein thrombus as she was unaware of this.

## 2018-12-22 NOTE — Progress Notes (Addendum)
[name] asked me to speak to his wife , [name].  [name] reports that patient has not complained of chest pain or shortness of breath. [place] had a COVID test on Friday, May 15 , which was negative.  [name] reports that patient has been quarantined at home with her and no one who is not in their core group have been in their home. Jose. Travis denies that she or her family has experienced any of the following: Cough Fever >100.4 Runny Nose Sore Throat Difficulty breathing/ shortness of breath Travel in past 14 days- no.  [name] is a type II diabetic, he is not taking Insulin any longer due to the fact that they cannot afford it- he was ordered  [employer] .  [name] reports that he had been getting it from [name] office, but they do not have any.  Patient is not taking anything for diabetes.  I sent [name] an inbox message to let him know. [name] reports that CBG was 200 this am and has been higher. I instructed patient to check CBG after awaking and every 2 hours until arrival  to the hospital.  I Instructed patient if CBG is less than 70 to drink 1/2 cup of a clear juice. Recheck CBG in 15 minutes then call pre- op desk at [phone] for further instructions.

## 2018-12-22 NOTE — Anesthesia Preprocedure Evaluation (Addendum)
Anesthesia Evaluation  Patient identified by MRN, date of birth, ID band Patient awake    Reviewed: Allergy & Precautions, NPO status , Patient's Chart, lab work & pertinent test results, reviewed documented beta blocker date and time   Airway Mallampati: II  TM Distance: >3 FB Neck ROM: Full    Dental  (+) Edentulous Upper, Edentulous Lower, Dental Advisory Given   Pulmonary former smoker,    breath sounds clear to auscultation       Cardiovascular hypertension, Pt. on medications and Pt. on home beta blockers + CAD and + Peripheral Vascular Disease  + dysrhythmias + Valvular Problems/Murmurs  Rhythm:Regular Rate:Normal     Neuro/Psych negative neurological ROS     GI/Hepatic GERD  ,(+) Hepatitis -  Endo/Other  diabetesHypothyroidism   Renal/GU Renal disease     Musculoskeletal  (+) Arthritis ,   Abdominal   Peds  Hematology negative hematology ROS (+)   Anesthesia Other Findings   Reproductive/Obstetrics                           Anesthesia Physical Anesthesia Plan  ASA: III  Anesthesia Plan: General   Post-op Pain Management:    Induction: Intravenous  PONV Risk Score and Plan: 2 and Dexamethasone and Ondansetron  Airway Management Planned: Oral ETT  Additional Equipment:   Intra-op Plan:   Post-operative Plan: Extubation in OR  Informed Consent: I have reviewed the patients History and Physical, chart, labs and discussed the procedure including the risks, benefits and alternatives for the proposed anesthesia with the patient or authorized representative who has indicated his/her understanding and acceptance.     Dental advisory given  Plan Discussed with: CRNA  Anesthesia Plan Comments:       Anesthesia Quick Evaluation

## 2018-12-23 ENCOUNTER — Ambulatory Visit (HOSPITAL_COMMUNITY): Payer: Medicare HMO | Admitting: Hematology

## 2018-12-23 ENCOUNTER — Encounter (HOSPITAL_COMMUNITY)
Admission: RE | Disposition: A | Discharge status: Home / Self Care | Payer: Self-pay | Source: Home / Self Care | Attending: Gastroenterology

## 2018-12-23 ENCOUNTER — Ambulatory Visit (HOSPITAL_COMMUNITY): Payer: Medicare HMO | Admitting: Certified Registered Nurse Anesthetist

## 2018-12-23 ENCOUNTER — Ambulatory Visit (HOSPITAL_COMMUNITY)
Admission: RE | Admit: 2018-12-23 | Discharge: 2018-12-23 | Disposition: A | Discharge status: Home / Self Care | Payer: Medicare HMO | Attending: Gastroenterology | Admitting: Gastroenterology

## 2018-12-23 ENCOUNTER — Encounter (HOSPITAL_COMMUNITY): Payer: Self-pay | Admitting: Certified Registered Nurse Anesthetist

## 2018-12-23 ENCOUNTER — Other Ambulatory Visit: Payer: Self-pay

## 2018-12-23 ENCOUNTER — Telehealth: Payer: Self-pay | Admitting: *Deleted

## 2018-12-23 DIAGNOSIS — Z79899 Other long term (current) drug therapy: Secondary | ICD-10-CM | POA: Insufficient documentation

## 2018-12-23 DIAGNOSIS — Z952 Presence of prosthetic heart valve: Secondary | ICD-10-CM | POA: Diagnosis not present

## 2018-12-23 DIAGNOSIS — I8500 Esophageal varices without bleeding: Secondary | ICD-10-CM | POA: Diagnosis not present

## 2018-12-23 DIAGNOSIS — C229 Malignant neoplasm of liver, not specified as primary or secondary: Secondary | ICD-10-CM | POA: Diagnosis not present

## 2018-12-23 DIAGNOSIS — I10 Essential (primary) hypertension: Secondary | ICD-10-CM | POA: Insufficient documentation

## 2018-12-23 DIAGNOSIS — E1151 Type 2 diabetes mellitus with diabetic peripheral angiopathy without gangrene: Secondary | ICD-10-CM | POA: Insufficient documentation

## 2018-12-23 DIAGNOSIS — E785 Hyperlipidemia, unspecified: Secondary | ICD-10-CM | POA: Insufficient documentation

## 2018-12-23 DIAGNOSIS — R945 Abnormal results of liver function studies: Secondary | ICD-10-CM | POA: Diagnosis not present

## 2018-12-23 DIAGNOSIS — K766 Portal hypertension: Secondary | ICD-10-CM | POA: Diagnosis not present

## 2018-12-23 DIAGNOSIS — Z885 Allergy status to narcotic agent status: Secondary | ICD-10-CM | POA: Diagnosis not present

## 2018-12-23 DIAGNOSIS — Z794 Long term (current) use of insulin: Secondary | ICD-10-CM | POA: Diagnosis not present

## 2018-12-23 DIAGNOSIS — M1990 Unspecified osteoarthritis, unspecified site: Secondary | ICD-10-CM | POA: Diagnosis not present

## 2018-12-23 DIAGNOSIS — E559 Vitamin D deficiency, unspecified: Secondary | ICD-10-CM | POA: Insufficient documentation

## 2018-12-23 DIAGNOSIS — C220 Liver cell carcinoma: Secondary | ICD-10-CM | POA: Diagnosis not present

## 2018-12-23 DIAGNOSIS — K3189 Other diseases of stomach and duodenum: Secondary | ICD-10-CM | POA: Insufficient documentation

## 2018-12-23 DIAGNOSIS — K7460 Unspecified cirrhosis of liver: Secondary | ICD-10-CM | POA: Insufficient documentation

## 2018-12-23 DIAGNOSIS — Z888 Allergy status to other drugs, medicaments and biological substances status: Secondary | ICD-10-CM | POA: Diagnosis not present

## 2018-12-23 DIAGNOSIS — K851 Biliary acute pancreatitis without necrosis or infection: Secondary | ICD-10-CM

## 2018-12-23 DIAGNOSIS — Z7982 Long term (current) use of aspirin: Secondary | ICD-10-CM | POA: Diagnosis not present

## 2018-12-23 DIAGNOSIS — K8020 Calculus of gallbladder without cholecystitis without obstruction: Secondary | ICD-10-CM | POA: Diagnosis not present

## 2018-12-23 DIAGNOSIS — I2510 Atherosclerotic heart disease of native coronary artery without angina pectoris: Secondary | ICD-10-CM | POA: Insufficient documentation

## 2018-12-23 DIAGNOSIS — K219 Gastro-esophageal reflux disease without esophagitis: Secondary | ICD-10-CM | POA: Diagnosis not present

## 2018-12-23 DIAGNOSIS — Z87891 Personal history of nicotine dependence: Secondary | ICD-10-CM | POA: Diagnosis not present

## 2018-12-23 DIAGNOSIS — I8510 Secondary esophageal varices without bleeding: Secondary | ICD-10-CM | POA: Diagnosis not present

## 2018-12-23 DIAGNOSIS — R7989 Other specified abnormal findings of blood chemistry: Secondary | ICD-10-CM

## 2018-12-23 HISTORY — DX: Personal history of urinary calculi: Z87.442

## 2018-12-23 HISTORY — PX: EUS: SHX5427

## 2018-12-23 HISTORY — DX: Acute pancreatitis without necrosis or infection, unspecified: K85.90

## 2018-12-23 HISTORY — DX: Unspecified osteoarthritis, unspecified site: M19.90

## 2018-12-23 HISTORY — DX: Fracture of unspecified shoulder girdle, part unspecified, initial encounter for closed fracture: S42.90XA

## 2018-12-23 HISTORY — PX: ESOPHAGOGASTRODUODENOSCOPY (EGD) WITH PROPOFOL: SHX5813

## 2018-12-23 LAB — COMPREHENSIVE METABOLIC PANEL
ALT: 135 U/L — ABNORMAL HIGH (ref 0–44)
AST: 127 U/L — ABNORMAL HIGH (ref 15–41)
Albumin: 3.4 g/dL — ABNORMAL LOW (ref 3.5–5.0)
Alkaline Phosphatase: 876 U/L — ABNORMAL HIGH (ref 38–126)
Anion gap: 14 (ref 5–15)
BUN: 13 mg/dL (ref 8–23)
CO2: 29 mmol/L (ref 22–32)
Calcium: 10.1 mg/dL (ref 8.9–10.3)
Chloride: 94 mmol/L — ABNORMAL LOW (ref 98–111)
Creatinine, Ser: 0.89 mg/dL (ref 0.61–1.24)
GFR calc Af Amer: >60 mL/min (ref >60)
GFR calc non Af Amer: >60 mL/min (ref >60)
Glucose, Bld: 163 mg/dL — ABNORMAL HIGH (ref 70–99)
Potassium: 4.9 mmol/L (ref 3.5–5.1)
Sodium: 137 mmol/L (ref 135–145)
Total Bilirubin: 2.5 mg/dL — ABNORMAL HIGH (ref 0.3–1.2)
Total Protein: 6.6 g/dL (ref 6.5–8.1)

## 2018-12-23 LAB — GLUCOSE, CAPILLARY: Glucose-Capillary: 144 mg/dL — ABNORMAL HIGH (ref 70–99)

## 2018-12-23 LAB — CBC
HCT: 45.5 % (ref 39.0–52.0)
Hemoglobin: 14.7 g/dL (ref 13.0–17.0)
MCH: 29.2 pg (ref 26.0–34.0)
MCHC: 32.3 g/dL (ref 30.0–36.0)
MCV: 90.3 fL (ref 80.0–100.0)
Platelets: 129 10*3/uL — ABNORMAL LOW (ref 150–400)
RBC: 5.04 MIL/uL (ref 4.22–5.81)
RDW: 16.8 % — ABNORMAL HIGH (ref 11.5–15.5)
WBC: 6.2 10*3/uL (ref 4.0–10.5)
nRBC: 0 % (ref 0.0–0.2)

## 2018-12-23 SURGERY — UPPER ENDOSCOPIC ULTRASOUND (EUS) RADIAL
Anesthesia: General

## 2018-12-23 MED ORDER — LACTATED RINGERS IV SOLN
INTRAVENOUS | Status: AC | PRN
  Administered 2018-12-23: 1000 mL via INTRAVENOUS

## 2018-12-23 MED ORDER — PROPOFOL 10 MG/ML IV BOLUS
INTRAVENOUS | Status: DC | PRN
  Administered 2018-12-23 (×3): 10 mg via INTRAVENOUS

## 2018-12-23 MED ORDER — SODIUM CHLORIDE 0.9 % IV SOLN: INTRAVENOUS | Status: DC

## 2018-12-23 MED ORDER — PHENYLEPHRINE 40 MCG/ML (10ML) SYRINGE FOR IV PUSH (FOR BLOOD PRESSURE SUPPORT)
PREFILLED_SYRINGE | INTRAVENOUS | Status: DC | PRN
  Administered 2018-12-23: 80 ug via INTRAVENOUS

## 2018-12-23 MED ORDER — LACTATED RINGERS IV SOLN
INTRAVENOUS | Status: DC | PRN
  Administered 2018-12-23: 07:00:00 via INTRAVENOUS

## 2018-12-23 MED ORDER — PROPOFOL 500 MG/50ML IV EMUL
INTRAVENOUS | Status: DC | PRN
  Administered 2018-12-23: 75 ug/kg/min via INTRAVENOUS

## 2018-12-23 NOTE — Telephone Encounter (Signed)
xxxxx 

## 2018-12-23 NOTE — Anesthesia Procedure Notes (Signed)
Procedure Name: MAC Date/Time: 12/23/2018 7:45 AM Performed by: Harden Mo, CRNA Pre-anesthesia Checklist: Patient identified, Emergency Drugs available, Suction available and Patient being monitored Patient Re-evaluated:Patient Re-evaluated prior to induction Oxygen Delivery Method: Nasal cannula Preoxygenation: Pre-oxygenation with 100% oxygen Induction Type: IV induction Placement Confirmation: positive ETCO2 and breath sounds checked- equal and bilateral Dental Injury: Teeth and Oropharynx as per pre-operative assessment

## 2018-12-23 NOTE — Anesthesia Postprocedure Evaluation (Signed)
Anesthesia Post Note  Patient: [name]  Procedure(s) Performed: UPPER ENDOSCOPIC ULTRASOUND (EUS) RADIAL (N/A ) ESOPHAGOGASTRODUODENOSCOPY (EGD) WITH PROPOFOL (N/A )     Patient location during evaluation: PACU Anesthesia Type: MAC Level of consciousness: awake and alert Pain management: pain level controlled Vital Signs Assessment: post-procedure vital signs reviewed and stable Respiratory status: spontaneous breathing, nonlabored ventilation, respiratory function stable and patient connected to nasal cannula oxygen Cardiovascular status: stable and blood pressure returned to baseline Postop Assessment: no apparent nausea or vomiting Anesthetic complications: no    Last Vitals:  Vitals:   12/23/18 0835 12/23/18 0845  BP: 112/61 140/73  Pulse: 77 76  Resp: 18 (!) 21  Temp:    SpO2: 99% 99%    Last Pain:  Vitals:   12/23/18 0829  TempSrc: Oral  PainSc: 0-No pain                 Tiajuana Amass

## 2018-12-23 NOTE — Interval H&P Note (Signed)
History and Physical Interval Note:  12/23/2018 7:11 AM  [name]  has presented today for surgery, with the diagnosis of Liver Cancer.  The various methods of treatment have been discussed with the patient and family. After consideration of risks, benefits and other options for treatment, the patient has consented to  Procedure(s): UPPER ENDOSCOPIC ULTRASOUND (EUS) RADIAL (N/A) ENDOSCOPIC RETROGRADE CHOLANGIOPANCREATOGRAPHY (ERCP) (N/A) as a surgical intervention.  The patient's history has been reviewed, patient examined, no change in status, stable for surgery.  I have reviewed the patient's chart and labs.  Questions were answered to the patient's satisfaction.     Milus Banister

## 2018-12-23 NOTE — Transfer of Care (Signed)
Immediate Anesthesia Transfer of Care Note  Patient: [name]  Procedure(s) Performed: UPPER ENDOSCOPIC ULTRASOUND (EUS) RADIAL (N/A )  Patient Location: Endoscopy Unit  Anesthesia Type:MAC  Level of Consciousness: awake and alert   Airway & Oxygen Therapy: Patient Spontanous Breathing and Patient connected to nasal cannula oxygen  Post-op Assessment: Report given to RN, Post -op Vital signs reviewed and stable and Patient moving all extremities X 4  Post vital signs: Reviewed and stable  Last Vitals:  Vitals Value Taken Time  BP    Temp    Pulse    Resp    SpO2      Last Pain:  Vitals:   12/23/18 0709  TempSrc: Oral  PainSc: 0-No pain         Complications: No apparent anesthesia complications

## 2018-12-23 NOTE — Op Note (Signed)
[facility] Patient Name: [name] Procedure Date : 12/23/2018 MRN: [id number] Attending MD: Milus Banister , MD Date of Birth: February 07, 1941 CSN: [id number] Age: 78 Admit Type: Outpatient Procedure:                Upper EUS Indications:              recent mild acute pancreatitis (presumed gallstone                            related), rising liver tests, + cholelithiasis,                            imaging (MR, CT) 'could not exclude' bile duct                            stones and also showed slightly prominant main                            pancreatic duct. All in the setting of new                            diagnosed locally advanced [place] and also cirrhosis Providers:                Milus Banister, MD, Cleda Daub, RN, Marguerita Merles, Technician, Charolette Child, Technician,                            Garrison [place], CRNA Referring MD:             Hildred Laser, MD Medicines:                Monitored Anesthesia Care Complications:            No immediate complications. Estimated blood loss:                            None. Estimated Blood Loss:     Estimated blood loss: none. Procedure:                Pre-Anesthesia Assessment:                           - Prior to the procedure, a History and Physical                            was performed, and patient medications and                            allergies were reviewed. The patient's tolerance of                            previous anesthesia was also reviewed. The risks  and benefits of the procedure and the sedation                            options and risks were discussed with the patient.                            All questions were answered, and informed consent                            was obtained. Prior Anticoagulants: The patient has                            taken no previous anticoagulant or antiplatelet                            agents. ASA  Grade Assessment: IV - A patient with                            severe systemic disease that is a constant threat                            to life. After reviewing the risks and benefits,                            the patient was deemed in satisfactory condition to                            undergo the procedure.                           After obtaining informed consent, the endoscope was                            passed under direct vision. Throughout the                            procedure, the patient's blood pressure, pulse, and                            oxygen saturations were monitored continuously. The                            [id number] ([id number]) Olympus Radial EUS scope was                            introduced through the mouth, and advanced to the                            second part of duodenum. The upper EUS was                            accomplished without difficulty. The patient  tolerated the procedure well. Scope In: Scope Out: Findings:      ENDOSCOPIC FINDING (limited views with duodenoscope and radial       echoendoscope): :      1. Small distal esphagus varices without signs of recent bleeding.      2. Severe portal gastropathy changes throughout proximal stomach which       bled with light scope contact.      3. Normal duodenum including very good views of the major papilla with       side viewing duodenoscope.      ENDOSONOGRAPHIC FINDING: :      1. CBD was normal: non-dilated and contained no stones or soft tissue       masses.      2. Shadowing cholelithiasis in gallbladder.      3. Pancreatic parenchyma was atrophic throughout but contained no       obvious masses or signs of chronic pancreatitis      4. Main pancreatic duct was prominant and slightly ectatic, measured       2-60m in body and tail. No obvious PD stones, masses.      5. Limited views of the liver, spleen, portal and splenic vessels were       all  normal. Impression:               - Normal CBD (non-dilated and without stones). I                            suspect he passed a stone last month which caused                            his mild acute pancreatitis. He still has several                            shadowing stones in his gallbladder. Could consider                            cholecystectomy but given his cirrhosis and newly                            diagnosed locally advanced [place] would probably                            be ill-advised. His persisent LFT elevation (this                            morning TBili 2.5, alk phos 876, AST/ALT 127/135)                            is most likely a reflection of his underlying                            decompensated cirrhosis and new hepatoma.                           - Portal hypertensive changes; small esophageal  varices and portal gastropathy that bled with light                            scope contact. This is likely from his cirrhosis                            however portal vein thrombus (tumor or blood?) may                            contribute. Recommendation:           - Discharge patient to home (ambulatory). Procedure Code(s):        --- Professional ---                           [phone], Esophagogastroduodenoscopy, flexible,                            transoral; with endoscopic ultrasound examination                            limited to the esophagus, stomach or duodenum, and                            adjacent structures Diagnosis Code(s):        --- Professional ---                           R74.8, Abnormal levels of other serum enzymes CPT copyright 2019 American Medical Association. All rights reserved. The codes documented in this report are preliminary and upon coder review may  be revised to meet current compliance requirements. Milus Banister, MD 12/23/2018 8:37:52 AM This report has been signed electronically. Number of  Addenda: 0

## 2018-12-23 NOTE — Discharge Instructions (Signed)
YOU HAD AN ENDOSCOPIC PROCEDURE TODAY: Refer to the procedure report and other information in the discharge instructions given to you for any specific questions about what was found during the examination. If this information does not answer your questions, please call [place] office at [phone] to clarify.   YOU SHOULD EXPECT: Some feelings of bloating in the abdomen. Passage of more gas than usual. Walking can help get rid of the air that was put into your GI tract during the procedure and reduce the bloating. If you had a lower endoscopy (such as a colonoscopy or flexible sigmoidoscopy) you may notice spotting of blood in your stool or on the toilet paper. Some abdominal soreness may be present for a day or two, also.  DIET: Your first meal following the procedure should be a light meal and then it is ok to progress to your normal diet. A half-sandwich or bowl of soup is an example of a good first meal. Heavy or fried foods are harder to digest and may make you feel nauseous or bloated. Drink plenty of fluids but you should avoid alcoholic beverages for 24 hours. If you had a esophageal dilation, please see attached instructions for diet.    ACTIVITY: Your care partner should take you home directly after the procedure. You should plan to take it easy, moving slowly for the rest of the day. You can resume normal activity the day after the procedure however YOU SHOULD NOT DRIVE, use power tools, machinery or perform tasks that involve climbing or major physical exertion for 24 hours (because of the sedation medicines used during the test).   SYMPTOMS TO REPORT IMMEDIATELY: A gastroenterologist can be reached at any hour. Please call [phone]  for any of the following symptoms:   Following upper endoscopy (EGD, EUS, ERCP, esophageal dilation) Vomiting of blood or coffee ground material  New, significant abdominal pain  New, significant chest pain or pain under the shoulder blades  Painful or  persistently difficult swallowing  New shortness of breath  Black, tarry-looking or red, bloody stools  FOLLOW UP:  If any biopsies were taken you will be contacted by phone or by letter within the next 1-3 weeks. Call [phone]  if you have not heard about the biopsies in 3 weeks.  Please also call with any specific questions about appointments or follow up tests.  

## 2018-12-24 ENCOUNTER — Encounter (HOSPITAL_COMMUNITY): Payer: Self-pay | Admitting: Gastroenterology

## 2018-12-24 ENCOUNTER — Telehealth: Payer: Self-pay | Admitting: *Deleted

## 2018-12-24 NOTE — Telephone Encounter (Signed)
CALLED PATIENT TO INFORM OF PET SCAN FOR 01-07-19 - ARRIVAL TIME - 10:30 AM @ [facility], PT. TO HAVE WATER ONLY - 6 HRS. PRIOR TO TEST, PT. CANNOT SUCK HARD CANDY OR CHEW ANY GUM, PT. TO NOT HAVE CARBS - 6 HRS. PRIOR TO TEST, AND PATIENT TO HOLD INSULIN- SHORT ACTING - 2 HRS. PRIOR TO TEST, AND LONG ACTING TO BE HELD - 8 HRS. PRIOR TO TEST, SPOKE WITH PATIENT'S WIFE - [name] AND SHE IS AWARE OF THESE APPTS.

## 2018-12-26 ENCOUNTER — Encounter (HOSPITAL_COMMUNITY): Payer: Self-pay | Admitting: Hematology

## 2018-12-26 ENCOUNTER — Inpatient Hospital Stay (HOSPITAL_COMMUNITY): Payer: Medicare HMO | Admitting: Hematology

## 2018-12-26 ENCOUNTER — Other Ambulatory Visit: Payer: Self-pay

## 2018-12-26 DIAGNOSIS — C220 Liver cell carcinoma: Secondary | ICD-10-CM

## 2018-12-26 DIAGNOSIS — Z87891 Personal history of nicotine dependence: Secondary | ICD-10-CM | POA: Diagnosis not present

## 2018-12-26 DIAGNOSIS — I2510 Atherosclerotic heart disease of native coronary artery without angina pectoris: Secondary | ICD-10-CM

## 2018-12-26 DIAGNOSIS — I10 Essential (primary) hypertension: Secondary | ICD-10-CM | POA: Diagnosis not present

## 2018-12-26 DIAGNOSIS — K5900 Constipation, unspecified: Secondary | ICD-10-CM | POA: Diagnosis not present

## 2018-12-26 MED ORDER — LACTULOSE 20 GM/30ML PO SOLN: 30.0000 mL | Freq: Every day | ORAL | 1 refills | Status: AC

## 2018-12-26 MED ORDER — DRONABINOL 2.5 MG PO CAPS: 2.5000 mg | ORAL_CAPSULE | Freq: Two times a day (BID) | ORAL | 0 refills | Status: DC
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
## 2018-12-26 NOTE — Patient Instructions (Signed)
Exam and discussion today with Dr. Delton Coombes. Return for follow-up as scheduled.

## 2018-12-26 NOTE — Assessment & Plan Note (Addendum)
1.  Hepatocellular carcinoma: - Hepatic mass found on recent admission with epigastric pain. -MRI of the liver on 11/11/2018 shows nodular liver contour with enlargement of the lateral segment of the left liver, features suggesting cirrhosis.  Early ill-defined hyperenhancement involving segment 4 suggesting ill-defined lesion, measuring 6.3 x 4.5 cm.  Lesion difficult to assess given motion degradation on postcontrast imaging but infiltrative HCC cannot be excluded. -AFP was elevated at 1451.  He does not have any traditional risk factors for cirrhosis including hepatitis and alcoholism. -He was evaluated by IR and felt not a candidate for Y 90 or bland embolization because of his worsening liver function. -Bone scan on 12/03/2018 shows uptake in the left lower ribs.  Transverse fracture at the left humerus consistent with fracture.  Uptake adjacent to the acetabular cup of the left hip prosthesis consistent with cup loosening.  No evidence of metastatic disease.  - CT angio abdomen on 12/09/2018 shows redemonstration of infiltrative hepatic tumor.  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 He was recommended ERCP which did not reveal any stones. -He was also evaluated by radiation oncology. -He is scheduled for a PET CT scan on 01/07/2019.  I will see him back after the PET CT scan to discuss the results and further plan of action.  2.  Loss of appetite: -He reports he does not have any appetite.  He sometimes gags on eating solid foods. -He is drinking 2 to 3 cans of Ensure per day.  I have suggested him to drink Ensure Plus 5 cans/day. - We will also start him on Marinol 2.5 mg twice daily.  We discussed the side effects in detail.  3.  Constipation: - He is taking stool softeners.  Still being constipated. -I will start him on lactulose 30 mL at bedtime.

## 2018-12-30 ENCOUNTER — Ambulatory Visit (HOSPITAL_COMMUNITY): Payer: Medicare HMO

## 2018-12-30 ENCOUNTER — Other Ambulatory Visit (HOSPITAL_COMMUNITY): Payer: Medicare HMO

## 2018-12-31 DIAGNOSIS — M25612 Stiffness of left shoulder, not elsewhere classified: Secondary | ICD-10-CM | POA: Diagnosis not present

## 2018-12-31 DIAGNOSIS — S42202D Unspecified fracture of upper end of left humerus, subsequent encounter for fracture with routine healing: Secondary | ICD-10-CM | POA: Diagnosis not present

## 2018-12-31 DIAGNOSIS — M25519 Pain in unspecified shoulder: Secondary | ICD-10-CM | POA: Diagnosis not present

## 2018-12-31 DIAGNOSIS — M6281 Muscle weakness (generalized): Secondary | ICD-10-CM | POA: Diagnosis not present

## 2019-01-02 DIAGNOSIS — S42202D Unspecified fracture of upper end of left humerus, subsequent encounter for fracture with routine healing: Secondary | ICD-10-CM | POA: Diagnosis not present

## 2019-01-02 DIAGNOSIS — M25612 Stiffness of left shoulder, not elsewhere classified: Secondary | ICD-10-CM | POA: Diagnosis not present

## 2019-01-02 DIAGNOSIS — M6281 Muscle weakness (generalized): Secondary | ICD-10-CM | POA: Diagnosis not present

## 2019-01-02 DIAGNOSIS — M25519 Pain in unspecified shoulder: Secondary | ICD-10-CM | POA: Diagnosis not present

## 2019-01-05 DIAGNOSIS — M25519 Pain in unspecified shoulder: Secondary | ICD-10-CM | POA: Diagnosis not present

## 2019-01-05 DIAGNOSIS — S42202D Unspecified fracture of upper end of left humerus, subsequent encounter for fracture with routine healing: Secondary | ICD-10-CM | POA: Diagnosis not present

## 2019-01-05 DIAGNOSIS — M6281 Muscle weakness (generalized): Secondary | ICD-10-CM | POA: Diagnosis not present

## 2019-01-05 DIAGNOSIS — M25612 Stiffness of left shoulder, not elsewhere classified: Secondary | ICD-10-CM | POA: Diagnosis not present

## 2019-01-07 ENCOUNTER — Ambulatory Visit (HOSPITAL_COMMUNITY)
Admission: RE | Admit: 2019-01-07 | Discharge: 2019-01-07 | Disposition: A | Discharge status: Home / Self Care | Payer: Medicare HMO | Source: Ambulatory Visit | Attending: Radiation Oncology | Admitting: Radiation Oncology

## 2019-01-07 ENCOUNTER — Other Ambulatory Visit: Payer: Self-pay

## 2019-01-07 DIAGNOSIS — C220 Liver cell carcinoma: Secondary | ICD-10-CM | POA: Diagnosis not present

## 2019-01-07 DIAGNOSIS — J439 Emphysema, unspecified: Secondary | ICD-10-CM | POA: Insufficient documentation

## 2019-01-07 DIAGNOSIS — J90 Pleural effusion, not elsewhere classified: Secondary | ICD-10-CM | POA: Insufficient documentation

## 2019-01-07 DIAGNOSIS — I2510 Atherosclerotic heart disease of native coronary artery without angina pectoris: Secondary | ICD-10-CM | POA: Diagnosis not present

## 2019-01-07 DIAGNOSIS — I700 Atherosclerosis of aorta: Secondary | ICD-10-CM | POA: Insufficient documentation

## 2019-01-07 LAB — GLUCOSE, CAPILLARY: Glucose-Capillary: 177 mg/dL — ABNORMAL HIGH (ref 70–99)

## 2019-01-07 MED ORDER — FLUDEOXYGLUCOSE F - 18 (FDG) INJECTION
6.7000 | Freq: Once | INTRAVENOUS | Status: AC | PRN
  Administered 2019-01-07: 6.7 via INTRAVENOUS

## 2019-01-08 ENCOUNTER — Telehealth: Payer: Self-pay | Admitting: Radiation Oncology

## 2019-01-08 NOTE — Telephone Encounter (Signed)
I called the patient's daughter back and we discussed the PET scan and the recommendation to proceed with 10 fractions of SBRT style radiotherapy. We did discuss that the diffuse infiltrative characteristics of his disease make this a difficult scenario to treat, but that we would be as aggressive about therapy as possible as he does not qualify for other local therapy. He will likely need continued evaluations with GI for his cirrhotic changes as well and the importance of keeping his bowels moving regularly with miralax and milk of magnesia. We will contact him to coordinate simulation.     Carola Rhine, PAC

## 2019-01-09 ENCOUNTER — Other Ambulatory Visit: Payer: Self-pay

## 2019-01-12 ENCOUNTER — Inpatient Hospital Stay (HOSPITAL_COMMUNITY): Payer: Medicare HMO | Attending: Hematology

## 2019-01-12 ENCOUNTER — Other Ambulatory Visit: Payer: Self-pay

## 2019-01-12 ENCOUNTER — Inpatient Hospital Stay (HOSPITAL_COMMUNITY): Payer: Medicare HMO | Admitting: Hematology

## 2019-01-12 ENCOUNTER — Encounter (HOSPITAL_COMMUNITY): Payer: Self-pay | Admitting: Hematology

## 2019-01-12 DIAGNOSIS — E119 Type 2 diabetes mellitus without complications: Secondary | ICD-10-CM | POA: Diagnosis not present

## 2019-01-12 DIAGNOSIS — I10 Essential (primary) hypertension: Secondary | ICD-10-CM | POA: Diagnosis not present

## 2019-01-12 DIAGNOSIS — K5900 Constipation, unspecified: Secondary | ICD-10-CM | POA: Diagnosis not present

## 2019-01-12 DIAGNOSIS — Z87891 Personal history of nicotine dependence: Secondary | ICD-10-CM

## 2019-01-12 DIAGNOSIS — C220 Liver cell carcinoma: Secondary | ICD-10-CM | POA: Diagnosis not present

## 2019-01-12 LAB — COMPREHENSIVE METABOLIC PANEL
ALT: 111 U/L — ABNORMAL HIGH (ref 0–44)
AST: 71 U/L — ABNORMAL HIGH (ref 15–41)
Albumin: 3.4 g/dL — ABNORMAL LOW (ref 3.5–5.0)
Alkaline Phosphatase: 648 U/L — ABNORMAL HIGH (ref 38–126)
Anion gap: 13 (ref 5–15)
BUN: 15 mg/dL (ref 8–23)
CO2: 27 mmol/L (ref 22–32)
Calcium: 9.9 mg/dL (ref 8.9–10.3)
Chloride: 92 mmol/L — ABNORMAL LOW (ref 98–111)
Creatinine, Ser: 0.76 mg/dL (ref 0.61–1.24)
GFR calc Af Amer: >60 mL/min (ref >60)
GFR calc non Af Amer: >60 mL/min (ref >60)
Glucose, Bld: 353 mg/dL — ABNORMAL HIGH (ref 70–99)
Potassium: 4.7 mmol/L (ref 3.5–5.1)
Sodium: 132 mmol/L — ABNORMAL LOW (ref 135–145)
Total Bilirubin: 1.3 mg/dL — ABNORMAL HIGH (ref 0.3–1.2)
Total Protein: 6.8 g/dL (ref 6.5–8.1)

## 2019-01-12 LAB — CBC WITH DIFFERENTIAL/PLATELET
Abs Immature Granulocytes: 0.02 10*3/uL (ref 0.00–0.07)
Basophils Absolute: 0.1 10*3/uL (ref 0.0–0.1)
Basophils Relative: 1 %
Eosinophils Absolute: 0.1 10*3/uL (ref 0.0–0.5)
Eosinophils Relative: 2 %
HCT: 45.3 % (ref 39.0–52.0)
Hemoglobin: 14.5 g/dL (ref 13.0–17.0)
Immature Granulocytes: 0 %
Lymphocytes Relative: 17 %
Lymphs Abs: 0.9 10*3/uL (ref 0.7–4.0)
MCH: 29.9 pg (ref 26.0–34.0)
MCHC: 32 g/dL (ref 30.0–36.0)
MCV: 93.4 fL (ref 80.0–100.0)
Monocytes Absolute: 0.3 10*3/uL (ref 0.1–1.0)
Monocytes Relative: 6 %
Neutro Abs: 3.8 10*3/uL (ref 1.7–7.7)
Neutrophils Relative %: 74 %
Platelets: 113 10*3/uL — ABNORMAL LOW (ref 150–400)
RBC: 4.85 MIL/uL (ref 4.22–5.81)
RDW: 15.2 % (ref 11.5–15.5)
WBC: 5.2 10*3/uL (ref 4.0–10.5)
nRBC: 0 % (ref 0.0–0.2)

## 2019-01-12 NOTE — Patient Instructions (Addendum)
[place] at [facility] Discharge Instructions  You were seen today by Dr. Delton Coombes. He went over your recent lab and scan results. He will see you back in 1 month for labs and follow up.   Thank you for choosing [place] at [facility] to provide your oncology and hematology care.  To afford each patient quality time with our provider, please arrive at least 15 minutes before your scheduled appointment time.   If you have a lab appointment with the [facility] please come in thru the  Main Entrance and check in at the main information desk  You need to re-schedule your appointment should you arrive 10 or more minutes late.  We strive to give you quality time with our providers, and arriving late affects you and other patients whose appointments are after yours.  Also, if you no show three or more times for appointments you may be dismissed from the clinic at the providers discretion.     Again, thank you for choosing [facility].  Our hope is that these requests will decrease the amount of time that you wait before being seen by our physicians.       _____________________________________________________________  Should you have questions after your visit to [facility], please contact our office at [phone] between the hours of 8:00 a.m. and 4:30 p.m.  Voicemails left after 4:00 p.m. will not be returned until the following business day.  For prescription refill requests, have your pharmacy contact our office and allow 72 hours.    Cancer Center Support Programs:   > Cancer Support Group  2nd Tuesday of the month 1pm-2pm, Journey Room
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
## 2019-01-12 NOTE — Assessment & Plan Note (Signed)
1.  Hepatocellular carcinoma: - Hepatic mass found on recent admission with epigastric pain. -MRI of the liver on 11/11/2018 shows nodular liver contour with enlargement of the lateral segment of the left liver, features suggesting cirrhosis.  Early ill-defined hyperenhancement involving segment 4 suggesting ill-defined lesion, measuring 6.3 x 4.5 cm.  Lesion difficult to assess given motion degradation on postcontrast imaging but infiltrative HCC cannot be excluded. -AFP was elevated at 1451.  He does not have any traditional risk factors for cirrhosis including hepatitis and alcoholism. -He was evaluated by IR and felt not a candidate for Y 90 or bland embolization because of his worsening liver function. -Bone scan on 12/03/2018 shows uptake in the left lower ribs.  Transverse fracture at the left humerus consistent with fracture.  Uptake adjacent to the acetabular cup of the left hip prosthesis consistent with cup loosening.  No evidence of metastatic disease.  - CT angio abdomen on 12/09/2018 shows redemonstration of infiltrative hepatic tumor.  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 He was recommended ERCP which did not reveal any stones. -PET CT scan on 01/07/2019 shows relatively low level hypermetabolic him corresponding to the high right liver lobe lesion (SUV max of 4.4).  No evidence of hypermetabolic metastatic disease. - He has an appointment to see Dr. Lisbeth Renshaw on 01/21/2019 and will start SBRT on 01/28/2019. -I will see him back in 1 month for follow-up.  2.  Loss of appetite: -At last visit I started him on Marinol 2.5 mg twice daily.  He reports that it is helping his appetite. -His weight went up by 2 pounds.  3.  Constipation: - He will continue stool softeners.  He is taking milk of magnesia combined with MiraLAX which helped him.

## 2019-01-13 LAB — AFP TUMOR MARKER: AFP, Serum, Tumor Marker: 2565 ng/mL — ABNORMAL HIGH (ref 0.0–8.3)

## 2019-01-14 ENCOUNTER — Other Ambulatory Visit (HOSPITAL_COMMUNITY): Payer: Medicare HMO

## 2019-01-16 ENCOUNTER — Ambulatory Visit (INDEPENDENT_AMBULATORY_CARE_PROVIDER_SITE_OTHER): Payer: Medicare HMO | Admitting: Family Medicine

## 2019-01-16 ENCOUNTER — Other Ambulatory Visit: Payer: Self-pay

## 2019-01-16 ENCOUNTER — Encounter: Payer: Self-pay | Admitting: Family Medicine

## 2019-01-16 VITALS — BP 110/68 | HR 104 | Temp 97.9°F | Resp 16 | Ht 68.0 in | Wt 122.0 lb

## 2019-01-16 DIAGNOSIS — K8020 Calculus of gallbladder without cholecystitis without obstruction: Secondary | ICD-10-CM

## 2019-01-16 DIAGNOSIS — R630 Anorexia: Secondary | ICD-10-CM | POA: Diagnosis not present

## 2019-01-16 DIAGNOSIS — R945 Abnormal results of liver function studies: Secondary | ICD-10-CM

## 2019-01-16 DIAGNOSIS — C220 Liver cell carcinoma: Secondary | ICD-10-CM | POA: Diagnosis not present

## 2019-01-16 DIAGNOSIS — R7989 Other specified abnormal findings of blood chemistry: Secondary | ICD-10-CM

## 2019-01-16 DIAGNOSIS — K8689 Other specified diseases of pancreas: Secondary | ICD-10-CM

## 2019-01-16 DIAGNOSIS — K8500 Idiopathic acute pancreatitis without necrosis or infection: Secondary | ICD-10-CM
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
## 2019-01-20 ENCOUNTER — Encounter: Payer: Self-pay | Admitting: Family Medicine

## 2019-01-20 ENCOUNTER — Ambulatory Visit (INDEPENDENT_AMBULATORY_CARE_PROVIDER_SITE_OTHER): Payer: Medicare HMO | Admitting: Family Medicine

## 2019-01-20 ENCOUNTER — Other Ambulatory Visit: Payer: Self-pay

## 2019-01-20 VITALS — BP 130/78 | HR 102 | Temp 97.6°F | Resp 18 | Ht 68.0 in | Wt 122.0 lb

## 2019-01-20 DIAGNOSIS — R0602 Shortness of breath: Secondary | ICD-10-CM

## 2019-01-20 DIAGNOSIS — C220 Liver cell carcinoma: Secondary | ICD-10-CM

## 2019-01-20 DIAGNOSIS — R630 Anorexia: Secondary | ICD-10-CM | POA: Diagnosis not present

## 2019-01-20 DIAGNOSIS — R110 Nausea: Secondary | ICD-10-CM | POA: Diagnosis not present

## 2019-01-20 DIAGNOSIS — R1010 Upper abdominal pain, unspecified: Secondary | ICD-10-CM | POA: Diagnosis not present

## 2019-01-20 DIAGNOSIS — K8020 Calculus of gallbladder without cholecystitis without obstruction: Secondary | ICD-10-CM

## 2019-01-20 LAB — COMPLETE METABOLIC PANEL WITH GFR
AG Ratio: 1.4 (calc) (ref 1.0–2.5)
ALT: 102 U/L — ABNORMAL HIGH (ref 9–46)
AST: 72 U/L — ABNORMAL HIGH (ref 10–35)
Albumin: 4.3 g/dL (ref 3.6–5.1)
Alkaline phosphatase (APISO): 674 U/L — ABNORMAL HIGH (ref 35–144)
BUN/Creatinine Ratio: 23 (calc) — ABNORMAL HIGH (ref 6–22)
BUN: 15 mg/dL (ref 7–25)
CO2: 34 mmol/L — ABNORMAL HIGH (ref 20–32)
Calcium: 10.1 mg/dL (ref 8.6–10.3)
Chloride: 95 mmol/L — ABNORMAL LOW (ref 98–110)
Creat: 0.66 mg/dL — ABNORMAL LOW (ref 0.70–1.18)
GFR, Est African American: 107 mL/min/{1.73_m2} (ref >=60)
GFR, Est Non African American: 92 mL/min/{1.73_m2} (ref >=60)
Globulin: 3 g/dL (calc) (ref 1.9–3.7)
Glucose, Bld: 199 mg/dL — ABNORMAL HIGH (ref 65–99)
Potassium: 5 mmol/L (ref 3.5–5.3)
Sodium: 137 mmol/L (ref 135–146)
Total Bilirubin: 1.5 mg/dL — ABNORMAL HIGH (ref 0.2–1.2)
Total Protein: 7.3 g/dL (ref 6.1–8.1)

## 2019-01-20 LAB — CBC WITH DIFFERENTIAL/PLATELET
Absolute Monocytes: 535 cells/uL (ref 200–950)
Basophils Absolute: 73 cells/uL (ref 0–200)
Basophils Relative: 1.1 %
Eosinophils Absolute: 40 cells/uL (ref 15–500)
Eosinophils Relative: 0.6 %
HCT: 47.7 % (ref 38.5–50.0)
Hemoglobin: 15.9 g/dL (ref 13.2–17.1)
Lymphs Abs: 964 cells/uL (ref 850–3900)
MCH: 30.1 pg (ref 27.0–33.0)
MCHC: 33.3 g/dL (ref 32.0–36.0)
MCV: 90.3 fL (ref 80.0–100.0)
MPV: 13.6 fL — ABNORMAL HIGH (ref 7.5–12.5)
Monocytes Relative: 8.1 %
Neutro Abs: 4990 cells/uL (ref 1500–7800)
Neutrophils Relative %: 75.6 %
Platelets: 134 10*3/uL — ABNORMAL LOW (ref 140–400)
RBC: 5.28 10*6/uL (ref 4.20–5.80)
RDW: 14.1 % (ref 11.0–15.0)
Total Lymphocyte: 14.6 %
WBC: 6.6 10*3/uL (ref 3.8–10.8)

## 2019-01-20 LAB — LIPASE: Lipase: 5 U/L — ABNORMAL LOW (ref 7–60)

## 2019-01-20 MED ORDER — OXYCODONE HCL 5 MG PO TABS: 5.0000 mg | ORAL_TABLET | ORAL | 0 refills | Status: DC | PRN
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
## 2019-01-20 NOTE — Progress Notes (Signed)
Has armband been applied?  Yes  Does patient have an allergy to IV contrast dye?: No   Has patient ever received premedication for IV contrast dye?: N/A  Does patient take metformin?: No  If patient does take metformin when was the last dose: N/A   Date of lab work: 01/12/2019 BUN: 15  CR: 0.76 Gfr: >60  IV site: Left AC  Has IV site been added to flowsheet? Yes  BP 126/76 (BP Location: Right Arm)   Pulse (!) 107   Temp 98.5 F (36.9 C) (Oral)   Resp 18   SpO2 99%

## 2019-01-21 ENCOUNTER — Other Ambulatory Visit: Payer: Self-pay

## 2019-01-21 ENCOUNTER — Ambulatory Visit
Admission: RE | Admit: 2019-01-21 | Discharge: 2019-01-21 | Disposition: A | Discharge status: Home / Self Care | Payer: Medicare HMO | Source: Ambulatory Visit | Attending: Radiation Oncology | Admitting: Radiation Oncology

## 2019-01-21 ENCOUNTER — Ambulatory Visit (HOSPITAL_COMMUNITY): Payer: Medicare HMO | Admitting: Hematology

## 2019-01-21 VITALS — BP 126/76 | HR 107 | Temp 98.5°F | Resp 18

## 2019-01-21 DIAGNOSIS — C220 Liver cell carcinoma: Secondary | ICD-10-CM

## 2019-01-21 DIAGNOSIS — Z510 Encounter for antineoplastic radiation therapy: Secondary | ICD-10-CM | POA: Diagnosis present

## 2019-01-21 MED ORDER — SODIUM CHLORIDE 0.9% FLUSH
10.0000 mL | Freq: Once | INTRAVENOUS | Status: AC
  Administered 2019-01-21: 10 mL via INTRAVENOUS

## 2019-01-22 ENCOUNTER — Ambulatory Visit (INDEPENDENT_AMBULATORY_CARE_PROVIDER_SITE_OTHER): Payer: Medicare HMO | Admitting: Family Medicine

## 2019-01-22 VITALS — BP 110/58 | HR 84 | Temp 97.5°F | Resp 12 | Ht 68.0 in | Wt 123.0 lb

## 2019-01-22 DIAGNOSIS — R945 Abnormal results of liver function studies: Secondary | ICD-10-CM

## 2019-01-22 DIAGNOSIS — R1010 Upper abdominal pain, unspecified: Secondary | ICD-10-CM

## 2019-01-22 DIAGNOSIS — R7989 Other specified abnormal findings of blood chemistry: Secondary | ICD-10-CM

## 2019-01-22 DIAGNOSIS — C220 Liver cell carcinoma: Secondary | ICD-10-CM

## 2019-01-22 DIAGNOSIS — K8020 Calculus of gallbladder without cholecystitis without obstruction: Secondary | ICD-10-CM | POA: Diagnosis not present
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
## 2019-01-27 ENCOUNTER — Encounter (HOSPITAL_COMMUNITY): Payer: Self-pay

## 2019-01-27 ENCOUNTER — Other Ambulatory Visit (HOSPITAL_COMMUNITY): Payer: Medicare HMO

## 2019-01-27 ENCOUNTER — Ambulatory Visit (HOSPITAL_COMMUNITY): Payer: Medicare HMO

## 2019-01-28 ENCOUNTER — Ambulatory Visit
Admission: RE | Admit: 2019-01-28 | Discharge: 2019-01-28 | Disposition: A | Discharge status: Home / Self Care | Payer: Medicare HMO | Source: Ambulatory Visit | Attending: Radiation Oncology | Admitting: Radiation Oncology

## 2019-01-28 ENCOUNTER — Other Ambulatory Visit: Payer: Self-pay

## 2019-01-28 DIAGNOSIS — C220 Liver cell carcinoma: Secondary | ICD-10-CM | POA: Diagnosis not present

## 2019-01-28 DIAGNOSIS — Z510 Encounter for antineoplastic radiation therapy: Secondary | ICD-10-CM | POA: Diagnosis not present

## 2019-01-29 ENCOUNTER — Other Ambulatory Visit: Payer: Self-pay

## 2019-01-29 ENCOUNTER — Ambulatory Visit
Admission: RE | Admit: 2019-01-29 | Discharge: 2019-01-29 | Disposition: A | Discharge status: Home / Self Care | Payer: Medicare HMO | Source: Ambulatory Visit | Attending: Radiation Oncology | Admitting: Radiation Oncology

## 2019-01-29 DIAGNOSIS — Z510 Encounter for antineoplastic radiation therapy: Secondary | ICD-10-CM | POA: Diagnosis not present

## 2019-01-29 DIAGNOSIS — C220 Liver cell carcinoma: Secondary | ICD-10-CM | POA: Diagnosis not present

## 2019-01-30 ENCOUNTER — Ambulatory Visit
Admission: RE | Admit: 2019-01-30 | Discharge: 2019-01-30 | Disposition: A | Discharge status: Home / Self Care | Payer: Medicare HMO | Source: Ambulatory Visit | Attending: Radiation Oncology | Admitting: Radiation Oncology

## 2019-01-30 ENCOUNTER — Other Ambulatory Visit: Payer: Self-pay

## 2019-01-30 DIAGNOSIS — Z510 Encounter for antineoplastic radiation therapy: Secondary | ICD-10-CM | POA: Diagnosis not present

## 2019-01-30 DIAGNOSIS — C220 Liver cell carcinoma: Secondary | ICD-10-CM | POA: Diagnosis not present

## 2019-02-02 ENCOUNTER — Other Ambulatory Visit: Payer: Self-pay

## 2019-02-02 ENCOUNTER — Ambulatory Visit
Admission: RE | Admit: 2019-02-02 | Discharge: 2019-02-02 | Disposition: A | Discharge status: Home / Self Care | Payer: Medicare HMO | Source: Ambulatory Visit | Attending: Radiation Oncology | Admitting: Radiation Oncology

## 2019-02-02 DIAGNOSIS — C220 Liver cell carcinoma: Secondary | ICD-10-CM | POA: Diagnosis not present

## 2019-02-02 DIAGNOSIS — Z510 Encounter for antineoplastic radiation therapy: Secondary | ICD-10-CM | POA: Diagnosis not present

## 2019-02-03 ENCOUNTER — Ambulatory Visit
Admission: RE | Admit: 2019-02-03 | Discharge: 2019-02-03 | Disposition: A | Discharge status: Home / Self Care | Payer: Medicare HMO | Source: Ambulatory Visit | Attending: Radiation Oncology | Admitting: Radiation Oncology

## 2019-02-03 ENCOUNTER — Other Ambulatory Visit: Payer: Self-pay

## 2019-02-03 DIAGNOSIS — Z510 Encounter for antineoplastic radiation therapy: Secondary | ICD-10-CM | POA: Diagnosis not present

## 2019-02-03 DIAGNOSIS — R69 Illness, unspecified: Secondary | ICD-10-CM | POA: Diagnosis not present

## 2019-02-03 DIAGNOSIS — C220 Liver cell carcinoma: Secondary | ICD-10-CM | POA: Diagnosis not present

## 2019-02-04 ENCOUNTER — Other Ambulatory Visit: Payer: Self-pay

## 2019-02-04 ENCOUNTER — Encounter: Payer: Self-pay | Admitting: *Deleted

## 2019-02-04 ENCOUNTER — Other Ambulatory Visit: Payer: Self-pay | Admitting: Family Medicine

## 2019-02-04 ENCOUNTER — Ambulatory Visit
Admission: RE | Admit: 2019-02-04 | Discharge: 2019-02-04 | Disposition: A | Discharge status: Home / Self Care | Payer: Medicare HMO | Source: Ambulatory Visit | Attending: Radiation Oncology | Admitting: Radiation Oncology

## 2019-02-04 DIAGNOSIS — C220 Liver cell carcinoma: Secondary | ICD-10-CM | POA: Diagnosis not present

## 2019-02-04 DIAGNOSIS — Z510 Encounter for antineoplastic radiation therapy: Secondary | ICD-10-CM | POA: Diagnosis not present

## 2019-02-05 ENCOUNTER — Other Ambulatory Visit: Payer: Self-pay

## 2019-02-05 ENCOUNTER — Ambulatory Visit
Admission: RE | Admit: 2019-02-05 | Discharge: 2019-02-05 | Disposition: A | Discharge status: Home / Self Care | Payer: Medicare HMO | Source: Ambulatory Visit | Attending: Radiation Oncology | Admitting: Radiation Oncology

## 2019-02-05 DIAGNOSIS — Z510 Encounter for antineoplastic radiation therapy: Secondary | ICD-10-CM | POA: Diagnosis not present

## 2019-02-05 DIAGNOSIS — S42202A Unspecified fracture of upper end of left humerus, initial encounter for closed fracture: Secondary | ICD-10-CM | POA: Insufficient documentation

## 2019-02-05 DIAGNOSIS — C220 Liver cell carcinoma: Secondary | ICD-10-CM | POA: Diagnosis not present

## 2019-02-07 ENCOUNTER — Emergency Department (HOSPITAL_COMMUNITY)
Admission: EM | Admit: 2019-02-07 | Discharge: 2019-02-07 | Disposition: A | Discharge status: Home / Self Care | Payer: Medicare HMO | Attending: Emergency Medicine | Admitting: Emergency Medicine

## 2019-02-07 ENCOUNTER — Emergency Department (HOSPITAL_COMMUNITY): Payer: Medicare HMO

## 2019-02-07 ENCOUNTER — Other Ambulatory Visit: Payer: Self-pay

## 2019-02-07 ENCOUNTER — Encounter (HOSPITAL_COMMUNITY): Payer: Self-pay | Admitting: Emergency Medicine

## 2019-02-07 DIAGNOSIS — Z794 Long term (current) use of insulin: Secondary | ICD-10-CM | POA: Insufficient documentation

## 2019-02-07 DIAGNOSIS — I5022 Chronic systolic (congestive) heart failure: Secondary | ICD-10-CM | POA: Insufficient documentation

## 2019-02-07 DIAGNOSIS — R0781 Pleurodynia: Secondary | ICD-10-CM | POA: Diagnosis not present

## 2019-02-07 DIAGNOSIS — I110 Hypertensive heart disease with heart failure: Secondary | ICD-10-CM | POA: Insufficient documentation

## 2019-02-07 DIAGNOSIS — E039 Hypothyroidism, unspecified: Secondary | ICD-10-CM | POA: Diagnosis not present

## 2019-02-07 DIAGNOSIS — R079 Chest pain, unspecified: Secondary | ICD-10-CM

## 2019-02-07 DIAGNOSIS — Z7982 Long term (current) use of aspirin: Secondary | ICD-10-CM | POA: Insufficient documentation

## 2019-02-07 DIAGNOSIS — E119 Type 2 diabetes mellitus without complications: Secondary | ICD-10-CM | POA: Insufficient documentation

## 2019-02-07 DIAGNOSIS — R1084 Generalized abdominal pain: Secondary | ICD-10-CM | POA: Diagnosis not present

## 2019-02-07 DIAGNOSIS — Z79899 Other long term (current) drug therapy: Secondary | ICD-10-CM | POA: Insufficient documentation

## 2019-02-07 DIAGNOSIS — Z87891 Personal history of nicotine dependence: Secondary | ICD-10-CM | POA: Diagnosis not present

## 2019-02-07 DIAGNOSIS — R0689 Other abnormalities of breathing: Secondary | ICD-10-CM | POA: Diagnosis not present

## 2019-02-07 DIAGNOSIS — I2510 Atherosclerotic heart disease of native coronary artery without angina pectoris: Secondary | ICD-10-CM | POA: Diagnosis not present

## 2019-02-07 DIAGNOSIS — R000 Tachycardia, unspecified: Secondary | ICD-10-CM | POA: Diagnosis not present

## 2019-02-07 DIAGNOSIS — R0902 Hypoxemia: Secondary | ICD-10-CM | POA: Diagnosis not present

## 2019-02-07 LAB — BASIC METABOLIC PANEL
Anion gap: 13 (ref 5–15)
BUN: 17 mg/dL (ref 8–23)
CO2: 28 mmol/L (ref 22–32)
Calcium: 9.9 mg/dL (ref 8.9–10.3)
Chloride: 91 mmol/L — ABNORMAL LOW (ref 98–111)
Creatinine, Ser: 0.85 mg/dL (ref 0.61–1.24)
GFR calc Af Amer: >60 mL/min (ref >60)
GFR calc non Af Amer: >60 mL/min (ref >60)
Glucose, Bld: 186 mg/dL — ABNORMAL HIGH (ref 70–99)
Potassium: 3.9 mmol/L (ref 3.5–5.1)
Sodium: 132 mmol/L — ABNORMAL LOW (ref 135–145)

## 2019-02-07 LAB — CBC
HCT: 47.1 % (ref 39.0–52.0)
Hemoglobin: 15 g/dL (ref 13.0–17.0)
MCH: 29.2 pg (ref 26.0–34.0)
MCHC: 31.8 g/dL (ref 30.0–36.0)
MCV: 91.8 fL (ref 80.0–100.0)
Platelets: 104 10*3/uL — ABNORMAL LOW (ref 150–400)
RBC: 5.13 MIL/uL (ref 4.22–5.81)
RDW: 14.4 % (ref 11.5–15.5)
WBC: 4.3 10*3/uL (ref 4.0–10.5)
nRBC: 0 % (ref 0.0–0.2)

## 2019-02-07 LAB — TROPONIN I (HIGH SENSITIVITY)
Troponin I (High Sensitivity): 9 ng/L (ref <18)
Troponin I (High Sensitivity): 10 ng/L (ref <18)

## 2019-02-07 MED ORDER — IOHEXOL 350 MG/ML SOLN
75.0000 mL | Freq: Once | INTRAVENOUS | Status: AC | PRN
  Administered 2019-02-07: 75 mL via INTRAVENOUS

## 2019-02-07 MED ORDER — SODIUM CHLORIDE 0.9% FLUSH: 3.0000 mL | Freq: Once | INTRAVENOUS | Status: DC
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
## 2019-02-07 NOTE — Discharge Instructions (Signed)
Follow-up with cardiology on Monday.  If you develop worsening symptoms return to the emergency department

## 2019-02-07 NOTE — ED Triage Notes (Signed)
Pt c/o of left sided cp x 1 hour ago.  Took 4 ASA prior to arrival.  Also c/o of diarrhea since Friday

## 2019-02-09 ENCOUNTER — Ambulatory Visit: Payer: Medicare HMO | Admitting: Orthopedic Surgery

## 2019-02-09 ENCOUNTER — Ambulatory Visit
Admission: RE | Admit: 2019-02-09 | Discharge: 2019-02-09 | Disposition: A | Discharge status: Home / Self Care | Payer: Medicare HMO | Source: Ambulatory Visit | Attending: Radiation Oncology | Admitting: Radiation Oncology

## 2019-02-09 ENCOUNTER — Other Ambulatory Visit: Payer: Self-pay

## 2019-02-09 DIAGNOSIS — C220 Liver cell carcinoma: Secondary | ICD-10-CM | POA: Diagnosis not present

## 2019-02-09 DIAGNOSIS — Z510 Encounter for antineoplastic radiation therapy: Secondary | ICD-10-CM | POA: Diagnosis not present

## 2019-02-10 ENCOUNTER — Encounter (INDEPENDENT_AMBULATORY_CARE_PROVIDER_SITE_OTHER): Payer: Self-pay | Admitting: Internal Medicine

## 2019-02-10 ENCOUNTER — Ambulatory Visit (INDEPENDENT_AMBULATORY_CARE_PROVIDER_SITE_OTHER): Payer: Medicare HMO | Admitting: Internal Medicine

## 2019-02-10 ENCOUNTER — Ambulatory Visit
Admission: RE | Admit: 2019-02-10 | Discharge: 2019-02-10 | Disposition: A | Discharge status: Home / Self Care | Payer: Medicare HMO | Source: Ambulatory Visit | Attending: Radiation Oncology | Admitting: Radiation Oncology

## 2019-02-10 ENCOUNTER — Other Ambulatory Visit: Payer: Self-pay

## 2019-02-10 VITALS — BP 104/66 | HR 108 | Temp 94.4°F | Ht 66.0 in | Wt 118.8 lb

## 2019-02-10 DIAGNOSIS — C220 Liver cell carcinoma: Secondary | ICD-10-CM | POA: Diagnosis not present

## 2019-02-10 DIAGNOSIS — Z510 Encounter for antineoplastic radiation therapy: Secondary | ICD-10-CM | POA: Diagnosis not present

## 2019-02-10 DIAGNOSIS — R110 Nausea: Secondary | ICD-10-CM

## 2019-02-10 NOTE — Patient Instructions (Addendum)
Try eating small frequent meals. Soft foods. Take the Zofran as needed for nausea. Soft-Food Eating Plan A soft-food eating plan includes foods that are safe and easy to chew and swallow. Your health care provider or dietitian can help you find foods and flavors that fit into this plan. Follow this plan until your health care provider or dietitian says it is safe to start eating other foods and food textures. What are tips for following this plan? General guidelines   Take small bites of food, or cut food into pieces about  inch or smaller. Bite-sized pieces of food are easier to chew and swallow.  Eat moist foods. Avoid overly dry foods.  Avoid foods that: ? Are difficult to swallow, such as dry, chunky, crispy, or sticky foods. ? Are difficult to chew, such as hard, tough, or stringy foods. ? Contain nuts, seeds, or fruits.  Follow instructions from your dietitian about the types of liquids that are safe for you to swallow. You may be allowed to have: ? Thick liquids only. This includes only liquids that are thicker than honey. ? Thin and thick liquids. This includes all beverages and foods that become liquid at room temperature.  To make thick liquids: ? Purchase a commercial liquid thickening powder. These are available at grocery stores and pharmacies. ? Mix the thickener into liquids according to instructions on the label. ? Purchase ready-made thickened liquids. ? Thicken soup by pureeing, straining to remove chunks, and adding flour, potato flakes, or corn starch. ? Add commercial thickener to foods that become liquid at room temperature, such as milk shakes, yogurt, ice cream, gelatin, and sherbet.  Ask your health care provider whether you need to take a fiber supplement. Cooking  Cook meats so they stay tender and moist. Use methods like braising, stewing, or baking in liquid.  Cook vegetables and fruit until they are soft enough to be mashed with a fork.  Peel soft,  fresh fruits such as peaches, nectarines, and melons.  When making soup, make sure chunks of meat and vegetables are smaller than  inch.  Reheat leftover foods slowly so that a tough crust does not form. What foods are allowed? The items listed below may not be a complete list. Talk with your dietitian about what dietary choices are best for you. Grains Breads, muffins, pancakes, or waffles moistened with syrup, jelly, or butter. Dry cereals well-moistened with milk. Moist, cooked cereals. Well-cooked pasta and rice. Vegetables All soft-cooked vegetables. Shredded lettuce. Fruits All canned and cooked fruits. Soft, peeled fresh fruits. Strawberries. Dairy Milk. Cream. Yogurt. Cottage cheese. Soft cheese without the rind. Meats and other protein foods Tender, moist ground meat, poultry, or fish. Meat cooked in gravy or sauces. Eggs. Sweets and desserts Ice cream. Milk shakes. Sherbet. Pudding. Fats and oils Butter. Margarine. Olive, canola, sunflower, and grapeseed oil. Smooth salad dressing. Smooth cream cheese. Mayonnaise. Gravy. What foods are not allowed? The items listed bemay not be a complete list. Talk with your dietitian about what dietary choices are best for you. Grains Coarse or dry cereals, such as bran, granola, and shredded wheat. Tough or chewy crusty breads, such as [place] bread or baguettes. Breads with nuts, seeds, or fruit. Vegetables All raw vegetables. Cooked corn. Cooked vegetables that are tough or stringy. Tough, crisp, fried potatoes and potato skins. Fruits Fresh fruits with skins or seeds, or both, such as apples, pears, and grapes. Stringy, high-pulp fruits, such as papaya, pineapple, coconut, and mango. Fruit leather and all  dried fruit. Dairy Yogurt with nuts or coconut. Meats and other protein foods Hard, dry sausages. Dry meat, poultry, or fish. Meats with gristle. Fish with bones. Fried meat or fish. Lunch meat and hotdogs. Nuts and seeds. Chunky  peanut butter or other nut butters. Sweets and desserts Cakes or cookies that are very dry or chewy. Desserts with dried fruit, nuts, or coconut. Fried pastries. Very rich pastries. Fats and oils Cream cheese with fruit or nuts. Salad dressings with seeds or chunks. Summary  A soft-food eating plan includes foods that are safe and easy to swallow. Generally, the foods should be soft enough to be mashed with a fork.  Avoid foods that are dry, hard to chew, crunchy, sticky, stringy, or crispy.  Ask your health care provider whether you need to thicken your liquids and if you need to take a fiber supplement. This information is not intended to replace advice given to you by your health care provider. Make sure you discuss any questions you have with your health care provider. Document Released: 10/30/2007 Document Revised: 11/13/2018 Document Reviewed: 09/25/2016 Elsevier Patient Education  2020 [employer].
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
## 2019-02-11 ENCOUNTER — Ambulatory Visit
Admission: RE | Admit: 2019-02-11 | Discharge: 2019-02-11 | Disposition: A | Discharge status: Home / Self Care | Payer: Medicare HMO | Source: Ambulatory Visit | Attending: Radiation Oncology | Admitting: Radiation Oncology

## 2019-02-11 ENCOUNTER — Other Ambulatory Visit (HOSPITAL_COMMUNITY): Payer: Self-pay | Admitting: *Deleted

## 2019-02-11 ENCOUNTER — Other Ambulatory Visit (HOSPITAL_COMMUNITY): Payer: Self-pay | Admitting: Emergency Medicine

## 2019-02-11 DIAGNOSIS — C220 Liver cell carcinoma: Secondary | ICD-10-CM

## 2019-02-11 DIAGNOSIS — Z510 Encounter for antineoplastic radiation therapy: Secondary | ICD-10-CM | POA: Diagnosis not present

## 2019-02-11 LAB — COMPREHENSIVE METABOLIC PANEL
AG Ratio: 1.3 (calc) (ref 1.0–2.5)
ALT: 81 U/L — ABNORMAL HIGH (ref 9–46)
AST: 86 U/L — ABNORMAL HIGH (ref 10–35)
Albumin: 3.5 g/dL — ABNORMAL LOW (ref 3.6–5.1)
Alkaline phosphatase (APISO): 598 U/L — ABNORMAL HIGH (ref 35–144)
BUN: 14 mg/dL (ref 7–25)
CO2: 31 mmol/L (ref 20–32)
Calcium: 9.9 mg/dL (ref 8.6–10.3)
Chloride: 95 mmol/L — ABNORMAL LOW (ref 98–110)
Creat: 0.74 mg/dL (ref 0.70–1.18)
Globulin: 2.8 g/dL (calc) (ref 1.9–3.7)
Glucose, Bld: 329 mg/dL — ABNORMAL HIGH (ref 65–139)
Potassium: 3.9 mmol/L (ref 3.5–5.3)
Sodium: 135 mmol/L (ref 135–146)
Total Bilirubin: 1.4 mg/dL — ABNORMAL HIGH (ref 0.2–1.2)
Total Protein: 6.3 g/dL (ref 6.1–8.1)

## 2019-02-11 LAB — CBC WITH DIFFERENTIAL/PLATELET
Absolute Monocytes: 369 cells/uL (ref 200–950)
Basophils Absolute: 29 cells/uL (ref 0–200)
Basophils Relative: 0.7 %
Eosinophils Absolute: 49 cells/uL (ref 15–500)
Eosinophils Relative: 1.2 %
HCT: 43.9 % (ref 38.5–50.0)
Hemoglobin: 14.2 g/dL (ref 13.2–17.1)
Lymphs Abs: 279 cells/uL — ABNORMAL LOW (ref 850–3900)
MCH: 30 pg (ref 27.0–33.0)
MCHC: 32.3 g/dL (ref 32.0–36.0)
MCV: 92.8 fL (ref 80.0–100.0)
MPV: 13.1 fL — ABNORMAL HIGH (ref 7.5–12.5)
Monocytes Relative: 9 %
Neutro Abs: 3374 cells/uL (ref 1500–7800)
Neutrophils Relative %: 82.3 %
Platelets: 100 10*3/uL — ABNORMAL LOW (ref 140–400)
RBC: 4.73 10*6/uL (ref 4.20–5.80)
RDW: 13.5 % (ref 11.0–15.0)
Total Lymphocyte: 6.8 %
WBC: 4.1 10*3/uL (ref 3.8–10.8)

## 2019-02-12 ENCOUNTER — Encounter: Payer: Self-pay | Admitting: Radiation Oncology

## 2019-02-12 ENCOUNTER — Inpatient Hospital Stay (HOSPITAL_COMMUNITY): Payer: Medicare HMO

## 2019-02-12 ENCOUNTER — Inpatient Hospital Stay (HOSPITAL_COMMUNITY): Payer: Medicare HMO | Attending: Hematology | Admitting: Hematology

## 2019-02-12 ENCOUNTER — Other Ambulatory Visit: Payer: Self-pay

## 2019-02-12 ENCOUNTER — Encounter (HOSPITAL_COMMUNITY): Payer: Self-pay | Admitting: Hematology

## 2019-02-12 VITALS — BP 108/88 | HR 88 | Temp 97.1°F | Resp 16 | Wt 120.4 lb

## 2019-02-12 DIAGNOSIS — Z87891 Personal history of nicotine dependence: Secondary | ICD-10-CM | POA: Diagnosis not present

## 2019-02-12 DIAGNOSIS — C220 Liver cell carcinoma: Secondary | ICD-10-CM | POA: Diagnosis not present

## 2019-02-12 DIAGNOSIS — Z923 Personal history of irradiation: Secondary | ICD-10-CM

## 2019-02-12 DIAGNOSIS — R110 Nausea: Secondary | ICD-10-CM | POA: Insufficient documentation

## 2019-02-12 DIAGNOSIS — K5900 Constipation, unspecified: Secondary | ICD-10-CM | POA: Insufficient documentation

## 2019-02-12 DIAGNOSIS — E119 Type 2 diabetes mellitus without complications: Secondary | ICD-10-CM

## 2019-02-12 DIAGNOSIS — I10 Essential (primary) hypertension: Secondary | ICD-10-CM

## 2019-02-12 LAB — COMPREHENSIVE METABOLIC PANEL
ALT: 109 U/L — ABNORMAL HIGH (ref 0–44)
AST: 120 U/L — ABNORMAL HIGH (ref 15–41)
Albumin: 3.6 g/dL (ref 3.5–5.0)
Alkaline Phosphatase: 650 U/L — ABNORMAL HIGH (ref 38–126)
Anion gap: 10 (ref 5–15)
BUN: 13 mg/dL (ref 8–23)
CO2: 29 mmol/L (ref 22–32)
Calcium: 9.9 mg/dL (ref 8.9–10.3)
Chloride: 95 mmol/L — ABNORMAL LOW (ref 98–111)
Creatinine, Ser: 0.76 mg/dL (ref 0.61–1.24)
GFR calc Af Amer: >60 mL/min (ref >60)
GFR calc non Af Amer: >60 mL/min (ref >60)
Glucose, Bld: 281 mg/dL — ABNORMAL HIGH (ref 70–99)
Potassium: 4.6 mmol/L (ref 3.5–5.1)
Sodium: 134 mmol/L — ABNORMAL LOW (ref 135–145)
Total Bilirubin: 1.3 mg/dL — ABNORMAL HIGH (ref 0.3–1.2)
Total Protein: 7.4 g/dL (ref 6.5–8.1)

## 2019-02-12 LAB — CBC WITH DIFFERENTIAL/PLATELET
Abs Immature Granulocytes: 0.02 10*3/uL (ref 0.00–0.07)
Basophils Absolute: 0 10*3/uL (ref 0.0–0.1)
Basophils Relative: 1 %
Eosinophils Absolute: 0.1 10*3/uL (ref 0.0–0.5)
Eosinophils Relative: 2 %
HCT: 45.9 % (ref 39.0–52.0)
Hemoglobin: 14.7 g/dL (ref 13.0–17.0)
Immature Granulocytes: 1 %
Lymphocytes Relative: 7 %
Lymphs Abs: 0.2 10*3/uL — ABNORMAL LOW (ref 0.7–4.0)
MCH: 29.8 pg (ref 26.0–34.0)
MCHC: 32 g/dL (ref 30.0–36.0)
MCV: 92.9 fL (ref 80.0–100.0)
Monocytes Absolute: 0.3 10*3/uL (ref 0.1–1.0)
Monocytes Relative: 8 %
Neutro Abs: 2.7 10*3/uL (ref 1.7–7.7)
Neutrophils Relative %: 81 %
Platelets: 76 10*3/uL — ABNORMAL LOW (ref 150–400)
RBC: 4.94 MIL/uL (ref 4.22–5.81)
RDW: 14.7 % (ref 11.5–15.5)
WBC: 3.3 10*3/uL — ABNORMAL LOW (ref 4.0–10.5)
nRBC: 0 % (ref 0.0–0.2)

## 2019-02-12 NOTE — Patient Instructions (Signed)
[facility] at [facility] Discharge Instructions  You were seen today by Dr. Katragadda. He went over your recent lab results. He will see you back in 2 weeks for labs and follow up.   Thank you for choosing [facility] at [facility] to provide your oncology and hematology care.  To afford each patient quality time with our provider, please arrive at least 15 minutes before your scheduled appointment time.   If you have a lab appointment with the Cancer Center please come in thru the  Main Entrance and check in at the main information desk  You need to re-schedule your appointment should you arrive 10 or more minutes late.  We strive to give you quality time with our providers, and arriving late affects you and other patients whose appointments are after yours.  Also, if you no show three or more times for appointments you may be dismissed from the clinic at the providers discretion.     Again, thank you for choosing [facility].  Our hope is that these requests will decrease the amount of time that you wait before being seen by our physicians.       _____________________________________________________________  Should you have questions after your visit to [facility], please contact our office at [phone] between the hours of 8:00 a.m. and 4:30 p.m.  Voicemails left after 4:00 p.m. will not be returned until the following business day.  For prescription refill requests, have your pharmacy contact our office and allow 72 hours.    Cancer Center Support Programs:   > Cancer Support Group  2nd Tuesday of the month 1pm-2pm, Journey Room    

## 2019-02-12 NOTE — Assessment & Plan Note (Signed)
1.  Hepatocellular carcinoma: - MRI of the liver on 11/11/2018 shows nodular liver contour with enlargement of the lateral segment of the left liver, features suggesting cirrhosis.  Early ill-defined hyperenhancement involving segment 4 suggesting ill-defined lesion, measuring 6.3 x 4.5 cm.  Lesion difficult to assess given motion degradation.  Infiltrative HCC cannot be excluded. -AFP was elevated at 1451.  He does not have any traditional risk factors for cirrhosis including hepatitis and alcoholism. - He was evaluated by IR and felt not a candidate for Y 90 or bland embolization because of his worsening liver function. - Bone scan on 12/03/2018 shows uptake in the left lower ribs.  Transverse fracture of the left humerus consistent with fracture.  Uptake adjacent to the Staebler cup of the left hip prosthesis consistent with cup loosening.  No evidence of metastatic disease. - CT Angie abdomen on 12/09/2018 shows stable demonstration of infiltrative hepatic tumor.  There is invasion of portal vein from the cancer and therefore is not a candidate for planned hepatic embolization because of risk for hepatic necrosis.  ERCP did not reveal any stones. -PET scan on 01/07/2019 shows relatively low level hypermetabolism corresponding to the high right liver lobe lesion (SUV max 4.4) with no evidence of metastatic disease. - He completed SBRT from 01/28/2019 through 02/11/2019. - I have reviewed his labs.  He has elevated LFTs most likely from SBRT.  He does have occasional nausea.  He was instructed to take Compazine as needed. - He will come back in 2 weeks for follow-up.  2.  Loss of appetite: - He will continue Marinol 2.5 mg twice daily.  His weight is more or less stable. - He will continue to drink Ensure 2 cans/day.  3.  Constipation: -We will continue stool softeners.  He will also use milk of magnesia as well as MiraLAX.
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
## 2019-02-13 ENCOUNTER — Other Ambulatory Visit (INDEPENDENT_AMBULATORY_CARE_PROVIDER_SITE_OTHER): Payer: Self-pay | Admitting: Internal Medicine

## 2019-02-13 LAB — AFP TUMOR MARKER: AFP, Serum, Tumor Marker: 4069 ng/mL — ABNORMAL HIGH (ref 0.0–8.3)

## 2019-02-16 ENCOUNTER — Other Ambulatory Visit (INDEPENDENT_AMBULATORY_CARE_PROVIDER_SITE_OTHER): Payer: Self-pay | Admitting: Internal Medicine

## 2019-02-16 ENCOUNTER — Ambulatory Visit: Payer: Medicare HMO | Admitting: Orthopedic Surgery

## 2019-02-16 MED ORDER — POTASSIUM CHLORIDE CRYS ER 10 MEQ PO TBCR: 10.0000 meq | EXTENDED_RELEASE_TABLET | Freq: Two times a day (BID) | ORAL | 2 refills | Status: DC

## 2019-02-16 NOTE — Progress Notes (Signed)
Patient's wife called stating that he cannot swallow K-Dur 20. Prescription changed to KCl 10 mEq and he will take 2 pills a day.  She also states that he has nausea and unable to eat and losing weight. He has ondansetron which is not helping. He has an appointment to see Dr. Delton Coombes on 23 July. He did have endoscopic ultrasound and no abnormality was noted in his upper GI tract to account for his nausea. Suspect his symptom is due to [facility].

## 2019-02-25 ENCOUNTER — Other Ambulatory Visit (HOSPITAL_COMMUNITY): Payer: Self-pay | Admitting: Hematology

## 2019-02-26 ENCOUNTER — Ambulatory Visit (HOSPITAL_COMMUNITY): Payer: Medicare HMO | Admitting: Hematology

## 2019-02-26 ENCOUNTER — Encounter (HOSPITAL_COMMUNITY): Payer: Self-pay | Admitting: Hematology

## 2019-02-26 ENCOUNTER — Other Ambulatory Visit (HOSPITAL_COMMUNITY): Payer: Medicare HMO

## 2019-02-26 ENCOUNTER — Other Ambulatory Visit (HOSPITAL_COMMUNITY): Payer: Self-pay | Admitting: *Deleted

## 2019-02-26 ENCOUNTER — Inpatient Hospital Stay (HOSPITAL_BASED_OUTPATIENT_CLINIC_OR_DEPARTMENT_OTHER): Payer: Medicare HMO | Admitting: Hematology

## 2019-02-26 ENCOUNTER — Inpatient Hospital Stay (HOSPITAL_COMMUNITY): Payer: Medicare HMO

## 2019-02-26 ENCOUNTER — Other Ambulatory Visit: Payer: Self-pay

## 2019-02-26 VITALS — BP 103/58 | HR 92 | Temp 98.6°F | Resp 18 | Wt 117.8 lb

## 2019-02-26 DIAGNOSIS — B029 Zoster without complications: Secondary | ICD-10-CM

## 2019-02-26 DIAGNOSIS — C220 Liver cell carcinoma: Secondary | ICD-10-CM | POA: Diagnosis not present

## 2019-02-26 DIAGNOSIS — Z923 Personal history of irradiation: Secondary | ICD-10-CM | POA: Diagnosis not present

## 2019-02-26 DIAGNOSIS — Z87891 Personal history of nicotine dependence: Secondary | ICD-10-CM | POA: Diagnosis not present

## 2019-02-26 DIAGNOSIS — K5900 Constipation, unspecified: Secondary | ICD-10-CM | POA: Diagnosis not present

## 2019-02-26 DIAGNOSIS — I10 Essential (primary) hypertension: Secondary | ICD-10-CM | POA: Diagnosis not present

## 2019-02-26 DIAGNOSIS — R110 Nausea: Secondary | ICD-10-CM | POA: Diagnosis not present

## 2019-02-26 DIAGNOSIS — E119 Type 2 diabetes mellitus without complications: Secondary | ICD-10-CM | POA: Diagnosis not present

## 2019-02-26 LAB — COMPREHENSIVE METABOLIC PANEL
ALT: 106 U/L — ABNORMAL HIGH (ref 0–44)
AST: 72 U/L — ABNORMAL HIGH (ref 15–41)
Albumin: 3.2 g/dL — ABNORMAL LOW (ref 3.5–5.0)
Alkaline Phosphatase: 558 U/L — ABNORMAL HIGH (ref 38–126)
Anion gap: 9 (ref 5–15)
BUN: 15 mg/dL (ref 8–23)
CO2: 29 mmol/L (ref 22–32)
Calcium: 9.6 mg/dL (ref 8.9–10.3)
Chloride: 96 mmol/L — ABNORMAL LOW (ref 98–111)
Creatinine, Ser: 0.67 mg/dL (ref 0.61–1.24)
GFR calc Af Amer: >60 mL/min (ref >60)
GFR calc non Af Amer: >60 mL/min (ref >60)
Glucose, Bld: 287 mg/dL — ABNORMAL HIGH (ref 70–99)
Potassium: 4.5 mmol/L (ref 3.5–5.1)
Sodium: 134 mmol/L — ABNORMAL LOW (ref 135–145)
Total Bilirubin: 1.3 mg/dL — ABNORMAL HIGH (ref 0.3–1.2)
Total Protein: 6.8 g/dL (ref 6.5–8.1)

## 2019-02-26 LAB — CBC WITH DIFFERENTIAL/PLATELET
Abs Immature Granulocytes: 0.01 10*3/uL (ref 0.00–0.07)
Basophils Absolute: 0 10*3/uL (ref 0.0–0.1)
Basophils Relative: 0 %
Eosinophils Absolute: 0.1 10*3/uL (ref 0.0–0.5)
Eosinophils Relative: 2 %
HCT: 43.5 % (ref 39.0–52.0)
Hemoglobin: 14.4 g/dL (ref 13.0–17.0)
Immature Granulocytes: 0 %
Lymphocytes Relative: 6 %
Lymphs Abs: 0.3 10*3/uL — ABNORMAL LOW (ref 0.7–4.0)
MCH: 30.5 pg (ref 26.0–34.0)
MCHC: 33.1 g/dL (ref 30.0–36.0)
MCV: 92.2 fL (ref 80.0–100.0)
Monocytes Absolute: 0.4 10*3/uL (ref 0.1–1.0)
Monocytes Relative: 7 %
Neutro Abs: 4.8 10*3/uL (ref 1.7–7.7)
Neutrophils Relative %: 85 %
Platelets: 87 10*3/uL — ABNORMAL LOW (ref 150–400)
RBC: 4.72 MIL/uL (ref 4.22–5.81)
RDW: 14.6 % (ref 11.5–15.5)
WBC: 5.6 10*3/uL (ref 4.0–10.5)
nRBC: 0 % (ref 0.0–0.2)

## 2019-02-26 MED ORDER — ONDANSETRON HCL 4 MG PO TABS: ORAL_TABLET | ORAL | 0 refills | Status: DC

## 2019-02-26 MED ORDER — VALACYCLOVIR HCL 1 G PO TABS: 1000.0000 mg | ORAL_TABLET | Freq: Two times a day (BID) | ORAL | 0 refills | Status: AC

## 2019-02-26 MED ORDER — DRONABINOL 2.5 MG PO CAPS: 2.5000 mg | ORAL_CAPSULE | Freq: Two times a day (BID) | ORAL | 0 refills | Status: DC

## 2019-02-26 NOTE — Patient Instructions (Signed)
[place] at [facility] Discharge Instructions  You were seen today by Dr. Delton Coombes.  He talked to you about how you are feeling.  We talked about your weight and you have lost 3 pounds.  We talked about you taking the marinol for your appetite.  He talked to you about your nausea.  We have called in a prescription for the marinol to the new pharmacy for you.  We also called in your nausea medication.  Dr. Raliegh Ip feels like your weight loss is due to the nausea and lack of appetite.  It is coming from the treatment and from the cancer so we have to make sure we stay on top of it.  Keep trying to get in enough calories and protein while you are not eating well.  If you are not eating any food during the day you need to have at least 5 boost daily.  You need to be taking in at least 1800 calories.   He talked to you about your rash. It is shingles.  We will start you on Valtrex that you need to take twice daily for 7 days. This is contagious to your family or visitors if they have not had the chicken pox as a child.    Your liver function numbers are coming down which is a positive response to the treatment.   We will see you back 3 weeks with labs.    Thank you for choosing [place] at [facility] to provide your oncology and hematology care.  To afford each patient quality time with our provider, please arrive at least 15 minutes before your scheduled appointment time.   If you have a lab appointment with the [facility] please come in thru the  Main Entrance and check in at the main information desk  You need to re-schedule your appointment should you arrive 10 or more minutes late.  We strive to give you quality time with our providers, and arriving late affects you and other patients whose appointments are after yours.  Also, if you no show three or more times for appointments you may be dismissed from the clinic at the providers discretion.      Again, thank you for choosing [facility].  Our hope is that these requests will decrease the amount of time that you wait before being seen by our physicians.       _____________________________________________________________  Should you have questions after your visit to [facility], please contact our office at [phone] between the hours of 8:00 a.m. and 4:30 p.m.  Voicemails left after 4:00 p.m. will not be returned until the following business day.  For prescription refill requests, have your pharmacy contact our office and allow 72 hours.    Cancer Center Support Programs:   > Cancer Support Group  2nd Tuesday of the month 1pm-2pm, Journey Room
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
## 2019-02-26 NOTE — Assessment & Plan Note (Signed)
1.  Hepatocellular carcinoma: - MRI of the liver on 11/11/2018 shows nodular liver contour with enlargement of the lateral segment of the left liver, features suggesting cirrhosis.  Early ill-defined hyperenhancement involving segment 4 suggesting ill-defined lesion, measuring 6.3 x 4.5 cm.  Lesion difficult to assess given motion degradation.  Infiltrative HCC cannot be excluded. -AFP was elevated at 1451.  He does not have any traditional risk factors for cirrhosis including hepatitis and alcoholism. - He was evaluated by IR and felt not a candidate for Y 90 or bland embolization because of his worsening liver function. - Bone scan on 12/03/2018 shows uptake in the left lower ribs.  Transverse fracture of the left humerus consistent with fracture.  Uptake adjacent to the Staebler cup of the left hip prosthesis consistent with cup loosening.  No evidence of metastatic disease. - CT angio abdomen on 12/09/2018 shows stable demonstration of infiltrative hepatic tumor.  There is invasion of portal vein from the cancer and therefore is not a candidate for planned hepatic embolization because of risk for hepatic necrosis.  ERCP did not reveal any stones. -PET scan on 01/07/2019 shows relatively low level hypermetabolism corresponding to the high right liver lobe lesion (SUV max 4.4) with no evidence of metastatic disease. - SBRT completed from 01/28/2019 through 02/11/2019. - He had elevated liver enzymes likely from SBRT.  They are improving today.  Denies any abdominal pain. -AFP on 02/12/2019 increased to 4069. -I will see him back in 3 weeks.  I will repeat another AFP level and LFTs.  We will also send a prescription for Zofran for his nausea.  2.  Loss of appetite: -He is continuing Marinol 2.5 mg twice daily.  He lost about 3 pounds since last visit. -He is drinking Ensure 2 to 3 cans/day.  I have suggested him to get Ensure Plus and drink about 5 cans/day.  He is not eating much solid foods.  He gets nauseous  occasionally.  3.  Right chest wall shingles: - He has noticed rash and burning sensation on Tuesday which is 2 days ago. -There is erythematous rash in the right posterior chest wall in dermatomal distribution. - We will send prescription for Valtrex for 7 days.

## 2019-02-27 ENCOUNTER — Other Ambulatory Visit: Payer: Self-pay | Admitting: Family Medicine

## 2019-02-27 ENCOUNTER — Other Ambulatory Visit (HOSPITAL_COMMUNITY): Payer: Self-pay | Admitting: Nurse Practitioner

## 2019-02-27 MED ORDER — ONDANSETRON HCL 4 MG PO TABS: ORAL_TABLET | ORAL | 0 refills | Status: DC

## 2019-03-02 ENCOUNTER — Other Ambulatory Visit (HOSPITAL_COMMUNITY): Payer: Self-pay | Admitting: *Deleted

## 2019-03-02 ENCOUNTER — Other Ambulatory Visit (HOSPITAL_COMMUNITY)
Admission: RE | Admit: 2019-03-02 | Discharge: 2019-03-02 | Disposition: A | Discharge status: Home / Self Care | Payer: Medicare HMO | Source: Ambulatory Visit | Attending: Hematology | Admitting: Hematology

## 2019-03-02 ENCOUNTER — Other Ambulatory Visit (HOSPITAL_COMMUNITY): Payer: Self-pay | Admitting: Nurse Practitioner

## 2019-03-02 ENCOUNTER — Ambulatory Visit (HOSPITAL_COMMUNITY)
Admission: RE | Admit: 2019-03-02 | Discharge: 2019-03-02 | Disposition: A | Discharge status: Home / Self Care | Payer: Medicare HMO | Source: Ambulatory Visit | Attending: Nurse Practitioner | Admitting: Nurse Practitioner

## 2019-03-02 ENCOUNTER — Other Ambulatory Visit: Payer: Self-pay

## 2019-03-02 DIAGNOSIS — Z20828 Contact with and (suspected) exposure to other viral communicable diseases: Secondary | ICD-10-CM | POA: Insufficient documentation

## 2019-03-02 DIAGNOSIS — R05 Cough: Secondary | ICD-10-CM | POA: Diagnosis not present

## 2019-03-02 DIAGNOSIS — R059 Cough, unspecified: Secondary | ICD-10-CM

## 2019-03-02 LAB — SARS CORONAVIRUS 2 (TAT 6-24 HRS): SARS Coronavirus 2: NEGATIVE

## 2019-03-02 NOTE — Progress Notes (Signed)
covid testing order placed

## 2019-03-07 ENCOUNTER — Encounter (HOSPITAL_COMMUNITY): Payer: Self-pay | Admitting: Hematology

## 2019-03-09 ENCOUNTER — Inpatient Hospital Stay (HOSPITAL_COMMUNITY): Payer: Medicare HMO | Attending: Hematology | Admitting: Hematology

## 2019-03-09 ENCOUNTER — Other Ambulatory Visit: Payer: Self-pay

## 2019-03-09 ENCOUNTER — Ambulatory Visit: Payer: Self-pay | Admitting: Hematology

## 2019-03-09 ENCOUNTER — Inpatient Hospital Stay (HOSPITAL_COMMUNITY): Payer: Medicare HMO

## 2019-03-09 ENCOUNTER — Encounter (HOSPITAL_COMMUNITY): Payer: Self-pay | Admitting: Hematology

## 2019-03-09 ENCOUNTER — Other Ambulatory Visit (HOSPITAL_COMMUNITY): Payer: Self-pay | Admitting: Nurse Practitioner

## 2019-03-09 VITALS — BP 131/61 | HR 107 | Temp 96.9°F | Resp 18

## 2019-03-09 DIAGNOSIS — C220 Liver cell carcinoma: Secondary | ICD-10-CM

## 2019-03-09 DIAGNOSIS — R627 Adult failure to thrive: Secondary | ICD-10-CM | POA: Diagnosis not present

## 2019-03-09 MED ORDER — PROMETHAZINE HCL 25 MG/ML IJ SOLN
12.5000 mg | Freq: Once | INTRAMUSCULAR | Status: AC
  Administered 2019-03-09: 12.5 mg via INTRAVENOUS

## 2019-03-09 MED ORDER — SODIUM CHLORIDE 0.9 % IV SOLN
INTRAVENOUS | Status: DC
  Administered 2019-03-09: 13:00:00 via INTRAVENOUS

## 2019-03-09 MED ORDER — SCOPOLAMINE 1 MG/3DAYS TD PT72
1.0000 | MEDICATED_PATCH | Freq: Once | TRANSDERMAL | Status: DC
  Filled 2019-03-09: qty 1

## 2019-03-09 MED ORDER — SODIUM CHLORIDE 0.9 % IV SOLN
Freq: Once | INTRAVENOUS | Status: AC
  Administered 2019-03-09: 13:00:00 via INTRAVENOUS
  Filled 2019-03-09: qty 4

## 2019-03-09 MED ORDER — SCOPOLAMINE 1 MG/3DAYS TD PT72
1.0000 | MEDICATED_PATCH | Freq: Once | TRANSDERMAL | Status: DC
  Administered 2019-03-09: 1.5 mg via TRANSDERMAL
  Filled 2019-03-09: qty 1

## 2019-03-09 MED ORDER — PROMETHAZINE HCL 25 MG/ML IJ SOLN
INTRAMUSCULAR | Status: AC
  Filled 2019-03-09: qty 1

## 2019-03-09 MED ORDER — SODIUM CHLORIDE 0.9 % IV SOLN
Freq: Once | INTRAVENOUS | Status: AC
  Administered 2019-03-09: 13:00:00 via INTRAVENOUS
  Filled 2019-03-09: qty 10

## 2019-03-09 NOTE — Progress Notes (Signed)
03/09/19  Received order for today for:  NS 1 liter + Potassium Chloride 20 meq + Magnesium sulfate 2 gm to be administered over 2 hours x 1 liter today.  Zofran 8 mg IVPB x 1 dose today.  Transderm Scop patch x 1 applied today.  T.O. Dr Beckey Downing LPN/Epifania Littrell Ronnald Ramp, PharmD

## 2019-03-09 NOTE — Progress Notes (Signed)
Patient to treatment area for hydration and nausea medications after oncology follow up visit with Dr. Delton Coombes. Family by side.  No s/s of distress noted.  Patients family stated he has nausea and unable to eat and losing weight.  Patient has finished XRT.   1515-patient having nausea.  Phenergan 12.5mg  IV verbal order Dr. Delton Coombes.  Medication diluted in NACL 78ml.  Patient denied stinging with phenergan push.  No bruising or swelling noted at site.    1552-patient stated he felt better after phenergan.  Patient denied stinging or burning with peripheral IV.  Denied nausea.  Instructed to call if nausea persists with understanding verbalized.  Message sent to Dr. Delton Coombes for zofran ODT.  Patients peripheral IV with good blood return noted after hydration.  No bruising or swelling noted at site.  Band aid applied.  VSS with discharge and left by wheelchair with no s/s of distress noted.
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
## 2019-03-09 NOTE — Patient Instructions (Signed)
[facility] at [facility] Discharge Instructions  You were seen today by Dr. Katragadda. He went over your recent lab results. He will see you back in 1 week for labs and follow up.   Thank you for choosing [facility] at [facility] to provide your oncology and hematology care.  To afford each patient quality time with our provider, please arrive at least 15 minutes before your scheduled appointment time.   If you have a lab appointment with the Cancer Center please come in thru the  Main Entrance and check in at the main information desk  You need to re-schedule your appointment should you arrive 10 or more minutes late.  We strive to give you quality time with our providers, and arriving late affects you and other patients whose appointments are after yours.  Also, if you no show three or more times for appointments you may be dismissed from the clinic at the providers discretion.     Again, thank you for choosing [facility].  Our hope is that these requests will decrease the amount of time that you wait before being seen by our physicians.       _____________________________________________________________  Should you have questions after your visit to [facility], please contact our office at [phone] between the hours of 8:00 a.m. and 4:30 p.m.  Voicemails left after 4:00 p.m. will not be returned until the following business day.  For prescription refill requests, have your pharmacy contact our office and allow 72 hours.    Cancer Center Support Programs:   > Cancer Support Group  2nd Tuesday of the month 1pm-2pm, Journey Room    

## 2019-03-09 NOTE — Assessment & Plan Note (Addendum)
1.  Hepatocellular carcinoma: - MRI of the liver on 11/11/2018 shows nodular liver contour with enlargement of the lateral segment of the left liver, features suggesting cirrhosis.  Segment 4 lesion measuring 6.3 x 4.5 cm.  AFP was elevated at 1451. - SBRT from 01/28/2019 through 02/11/2019. - He is complaining of pain in the right lateral rib region.  He also has some cough. -He also reported right upper quadrant pain which is intermittent, occasionally lasting up to 30 minutes. - Blood work on 02/26/2019 shows improving LFTs.  Chest x-ray did not reveal any infiltrative process.  COVID-19 testing was negative. - I think the pain is likely coming from radiation to the liver lesion.  This is not unbearable.  He will continue over-the-counter anticough medicine.  If it does not improve we will consider Tessalon Perles.  2.  Nausea: - He reportedly had nausea and vomited all day yesterday.  He was not able to keep liquids down. -We have given 1 L of fluid in the office.  I have given IV Zofran which did help somewhat.  He also received Phenergan 12.5 mg. -We will put a scopolamine patch.  If this helps, we will send a prescription.  3.  Loss of appetite: - He is continue Marinol 2.5 mg twice daily.  He is continuing to lose weight. - I will reevaluate him in 1 week.  If he continues to lose weight will consider Remeron. -He was encouraged to drink Ensure plus up to 5 cans/day if he is not eating well.

## 2019-03-10 ENCOUNTER — Other Ambulatory Visit: Payer: Self-pay

## 2019-03-10 ENCOUNTER — Emergency Department (HOSPITAL_COMMUNITY): Payer: Medicare HMO

## 2019-03-10 ENCOUNTER — Encounter (HOSPITAL_COMMUNITY): Payer: Self-pay

## 2019-03-10 ENCOUNTER — Inpatient Hospital Stay (HOSPITAL_COMMUNITY)
Admission: EM | Admit: 2019-03-10 | Discharge: 2019-04-07 | DRG: 435 | Disposition: E | Payer: Medicare HMO | Attending: Internal Medicine | Admitting: Internal Medicine

## 2019-03-10 DIAGNOSIS — R627 Adult failure to thrive: Secondary | ICD-10-CM | POA: Diagnosis present

## 2019-03-10 DIAGNOSIS — N400 Enlarged prostate without lower urinary tract symptoms: Secondary | ICD-10-CM | POA: Diagnosis present

## 2019-03-10 DIAGNOSIS — I700 Atherosclerosis of aorta: Secondary | ICD-10-CM | POA: Diagnosis not present

## 2019-03-10 DIAGNOSIS — J984 Other disorders of lung: Secondary | ICD-10-CM | POA: Diagnosis not present

## 2019-03-10 DIAGNOSIS — R1310 Dysphagia, unspecified: Secondary | ICD-10-CM | POA: Diagnosis not present

## 2019-03-10 DIAGNOSIS — R05 Cough: Secondary | ICD-10-CM | POA: Diagnosis not present

## 2019-03-10 DIAGNOSIS — E039 Hypothyroidism, unspecified: Secondary | ICD-10-CM | POA: Diagnosis present

## 2019-03-10 DIAGNOSIS — I420 Dilated cardiomyopathy: Secondary | ICD-10-CM | POA: Diagnosis present

## 2019-03-10 DIAGNOSIS — Z452 Encounter for adjustment and management of vascular access device: Secondary | ICD-10-CM

## 2019-03-10 DIAGNOSIS — R110 Nausea: Secondary | ICD-10-CM | POA: Diagnosis not present

## 2019-03-10 DIAGNOSIS — M810 Age-related osteoporosis without current pathological fracture: Secondary | ICD-10-CM | POA: Diagnosis present

## 2019-03-10 DIAGNOSIS — I361 Nonrheumatic tricuspid (valve) insufficiency: Secondary | ICD-10-CM | POA: Diagnosis not present

## 2019-03-10 DIAGNOSIS — A419 Sepsis, unspecified organism: Secondary | ICD-10-CM | POA: Diagnosis not present

## 2019-03-10 DIAGNOSIS — K5900 Constipation, unspecified: Secondary | ICD-10-CM | POA: Diagnosis present

## 2019-03-10 DIAGNOSIS — Z953 Presence of xenogenic heart valve: Secondary | ICD-10-CM

## 2019-03-10 DIAGNOSIS — Z7189 Other specified counseling: Secondary | ICD-10-CM | POA: Diagnosis not present

## 2019-03-10 DIAGNOSIS — J309 Allergic rhinitis, unspecified: Secondary | ICD-10-CM | POA: Diagnosis present

## 2019-03-10 DIAGNOSIS — Z681 Body mass index (BMI) 19 or less, adult: Secondary | ICD-10-CM

## 2019-03-10 DIAGNOSIS — J90 Pleural effusion, not elsewhere classified: Secondary | ICD-10-CM | POA: Diagnosis not present

## 2019-03-10 DIAGNOSIS — K7460 Unspecified cirrhosis of liver: Secondary | ICD-10-CM | POA: Diagnosis present

## 2019-03-10 DIAGNOSIS — R0689 Other abnormalities of breathing: Secondary | ICD-10-CM

## 2019-03-10 DIAGNOSIS — I471 Supraventricular tachycardia: Secondary | ICD-10-CM | POA: Diagnosis not present

## 2019-03-10 DIAGNOSIS — E559 Vitamin D deficiency, unspecified: Secondary | ICD-10-CM | POA: Diagnosis present

## 2019-03-10 DIAGNOSIS — K921 Melena: Secondary | ICD-10-CM | POA: Diagnosis not present

## 2019-03-10 DIAGNOSIS — Z978 Presence of other specified devices: Secondary | ICD-10-CM | POA: Diagnosis not present

## 2019-03-10 DIAGNOSIS — R1312 Dysphagia, oropharyngeal phase: Secondary | ICD-10-CM

## 2019-03-10 DIAGNOSIS — J189 Pneumonia, unspecified organism: Secondary | ICD-10-CM

## 2019-03-10 DIAGNOSIS — E876 Hypokalemia: Secondary | ICD-10-CM | POA: Diagnosis not present

## 2019-03-10 DIAGNOSIS — I2510 Atherosclerotic heart disease of native coronary artery without angina pectoris: Secondary | ICD-10-CM | POA: Diagnosis present

## 2019-03-10 DIAGNOSIS — Z515 Encounter for palliative care: Secondary | ICD-10-CM

## 2019-03-10 DIAGNOSIS — Z87891 Personal history of nicotine dependence: Secondary | ICD-10-CM

## 2019-03-10 DIAGNOSIS — Z885 Allergy status to narcotic agent status: Secondary | ICD-10-CM

## 2019-03-10 DIAGNOSIS — E43 Unspecified severe protein-calorie malnutrition: Secondary | ICD-10-CM | POA: Diagnosis not present

## 2019-03-10 DIAGNOSIS — J9601 Acute respiratory failure with hypoxia: Secondary | ICD-10-CM | POA: Diagnosis not present

## 2019-03-10 DIAGNOSIS — Z03818 Encounter for observation for suspected exposure to other biological agents ruled out: Secondary | ICD-10-CM | POA: Diagnosis not present

## 2019-03-10 DIAGNOSIS — I340 Nonrheumatic mitral (valve) insufficiency: Secondary | ICD-10-CM | POA: Diagnosis not present

## 2019-03-10 DIAGNOSIS — R6521 Severe sepsis with septic shock: Secondary | ICD-10-CM | POA: Diagnosis not present

## 2019-03-10 DIAGNOSIS — Z20828 Contact with and (suspected) exposure to other viral communicable diseases: Secondary | ICD-10-CM | POA: Diagnosis present

## 2019-03-10 DIAGNOSIS — E1165 Type 2 diabetes mellitus with hyperglycemia: Secondary | ICD-10-CM | POA: Diagnosis present

## 2019-03-10 DIAGNOSIS — E785 Hyperlipidemia, unspecified: Secondary | ICD-10-CM | POA: Diagnosis present

## 2019-03-10 DIAGNOSIS — Z7982 Long term (current) use of aspirin: Secondary | ICD-10-CM

## 2019-03-10 DIAGNOSIS — E860 Dehydration: Secondary | ICD-10-CM

## 2019-03-10 DIAGNOSIS — R740 Nonspecific elevation of levels of transaminase and lactic acid dehydrogenase [LDH]: Secondary | ICD-10-CM | POA: Diagnosis present

## 2019-03-10 DIAGNOSIS — J9811 Atelectasis: Secondary | ICD-10-CM | POA: Diagnosis present

## 2019-03-10 DIAGNOSIS — C220 Liver cell carcinoma: Secondary | ICD-10-CM | POA: Diagnosis present

## 2019-03-10 DIAGNOSIS — K219 Gastro-esophageal reflux disease without esophagitis: Secondary | ICD-10-CM | POA: Diagnosis present

## 2019-03-10 DIAGNOSIS — Z923 Personal history of irradiation: Secondary | ICD-10-CM

## 2019-03-10 DIAGNOSIS — K8020 Calculus of gallbladder without cholecystitis without obstruction: Secondary | ICD-10-CM | POA: Diagnosis not present

## 2019-03-10 DIAGNOSIS — Z7401 Bed confinement status: Secondary | ICD-10-CM

## 2019-03-10 DIAGNOSIS — Z66 Do not resuscitate: Secondary | ICD-10-CM | POA: Diagnosis present

## 2019-03-10 DIAGNOSIS — R64 Cachexia: Secondary | ICD-10-CM | POA: Diagnosis present

## 2019-03-10 DIAGNOSIS — E11649 Type 2 diabetes mellitus with hypoglycemia without coma: Secondary | ICD-10-CM | POA: Diagnosis not present

## 2019-03-10 DIAGNOSIS — Z87442 Personal history of urinary calculi: Secondary | ICD-10-CM

## 2019-03-10 DIAGNOSIS — Z79899 Other long term (current) drug therapy: Secondary | ICD-10-CM

## 2019-03-10 DIAGNOSIS — R0600 Dyspnea, unspecified: Secondary | ICD-10-CM

## 2019-03-10 DIAGNOSIS — I951 Orthostatic hypotension: Secondary | ICD-10-CM | POA: Diagnosis present

## 2019-03-10 DIAGNOSIS — R112 Nausea with vomiting, unspecified: Secondary | ICD-10-CM | POA: Diagnosis not present

## 2019-03-10 DIAGNOSIS — J690 Pneumonitis due to inhalation of food and vomit: Secondary | ICD-10-CM | POA: Diagnosis not present

## 2019-03-10 DIAGNOSIS — D638 Anemia in other chronic diseases classified elsewhere: Secondary | ICD-10-CM | POA: Diagnosis present

## 2019-03-10 DIAGNOSIS — I10 Essential (primary) hypertension: Secondary | ICD-10-CM | POA: Diagnosis present

## 2019-03-10 DIAGNOSIS — Z96642 Presence of left artificial hip joint: Secondary | ICD-10-CM | POA: Diagnosis present

## 2019-03-10 DIAGNOSIS — R0902 Hypoxemia: Secondary | ICD-10-CM

## 2019-03-10 DIAGNOSIS — Z794 Long term (current) use of insulin: Secondary | ICD-10-CM

## 2019-03-10 DIAGNOSIS — J9600 Acute respiratory failure, unspecified whether with hypoxia or hypercapnia: Secondary | ICD-10-CM | POA: Diagnosis not present

## 2019-03-10 DIAGNOSIS — Z79891 Long term (current) use of opiate analgesic: Secondary | ICD-10-CM

## 2019-03-10 DIAGNOSIS — Z888 Allergy status to other drugs, medicaments and biological substances status: Secondary | ICD-10-CM

## 2019-03-10 DIAGNOSIS — Z85828 Personal history of other malignant neoplasm of skin: Secondary | ICD-10-CM

## 2019-03-10 DIAGNOSIS — J9690 Respiratory failure, unspecified, unspecified whether with hypoxia or hypercapnia: Secondary | ICD-10-CM

## 2019-03-10 DIAGNOSIS — R42 Dizziness and giddiness: Secondary | ICD-10-CM | POA: Diagnosis not present

## 2019-03-10 DIAGNOSIS — R918 Other nonspecific abnormal finding of lung field: Secondary | ICD-10-CM | POA: Diagnosis not present

## 2019-03-10 DIAGNOSIS — Z8249 Family history of ischemic heart disease and other diseases of the circulatory system: Secondary | ICD-10-CM

## 2019-03-10 DIAGNOSIS — I959 Hypotension, unspecified: Secondary | ICD-10-CM | POA: Diagnosis present

## 2019-03-10 DIAGNOSIS — R54 Age-related physical debility: Secondary | ICD-10-CM | POA: Diagnosis present

## 2019-03-10 LAB — URINALYSIS, MICROSCOPIC (REFLEX)
Bacteria, UA: NONE SEEN
Squamous Epithelial / HPF: NONE SEEN (ref 0–5)
WBC, UA: >50 WBC/hpf (ref 0–5)

## 2019-03-10 LAB — COMPREHENSIVE METABOLIC PANEL
ALT: 94 U/L — ABNORMAL HIGH (ref 0–44)
AST: 66 U/L — ABNORMAL HIGH (ref 15–41)
Albumin: 3.2 g/dL — ABNORMAL LOW (ref 3.5–5.0)
Alkaline Phosphatase: 563 U/L — ABNORMAL HIGH (ref 38–126)
Anion gap: 13 (ref 5–15)
BUN: 29 mg/dL — ABNORMAL HIGH (ref 8–23)
CO2: 29 mmol/L (ref 22–32)
Calcium: 9.2 mg/dL (ref 8.9–10.3)
Chloride: 94 mmol/L — ABNORMAL LOW (ref 98–111)
Creatinine, Ser: 0.79 mg/dL (ref 0.61–1.24)
GFR calc Af Amer: >60 mL/min (ref >60)
GFR calc non Af Amer: >60 mL/min (ref >60)
Glucose, Bld: 217 mg/dL — ABNORMAL HIGH (ref 70–99)
Potassium: 4.2 mmol/L (ref 3.5–5.1)
Sodium: 136 mmol/L (ref 135–145)
Total Bilirubin: 1.9 mg/dL — ABNORMAL HIGH (ref 0.3–1.2)
Total Protein: 7.8 g/dL (ref 6.5–8.1)

## 2019-03-10 LAB — MAGNESIUM: Magnesium: 2.4 mg/dL (ref 1.7–2.4)

## 2019-03-10 LAB — URINALYSIS, ROUTINE W REFLEX MICROSCOPIC
Bilirubin Urine: NEGATIVE
Glucose, UA: 250 mg/dL — AB
Nitrite: NEGATIVE
Specific Gravity, Urine: >1.03 — ABNORMAL HIGH (ref 1.005–1.030)
pH: 5 (ref 5.0–8.0)

## 2019-03-10 LAB — CBC WITH DIFFERENTIAL/PLATELET
Abs Immature Granulocytes: 0.04 10*3/uL (ref 0.00–0.07)
Basophils Absolute: 0.1 10*3/uL (ref 0.0–0.1)
Basophils Relative: 0 %
Eosinophils Absolute: 0 10*3/uL (ref 0.0–0.5)
Eosinophils Relative: 0 %
HCT: 48.1 % (ref 39.0–52.0)
Hemoglobin: 15.5 g/dL (ref 13.0–17.0)
Immature Granulocytes: 0 %
Lymphocytes Relative: 5 %
Lymphs Abs: 0.6 10*3/uL — ABNORMAL LOW (ref 0.7–4.0)
MCH: 29.2 pg (ref 26.0–34.0)
MCHC: 32.2 g/dL (ref 30.0–36.0)
MCV: 90.8 fL (ref 80.0–100.0)
Monocytes Absolute: 0.4 10*3/uL (ref 0.1–1.0)
Monocytes Relative: 4 %
Neutro Abs: 10.2 10*3/uL — ABNORMAL HIGH (ref 1.7–7.7)
Neutrophils Relative %: 91 %
Platelets: 219 10*3/uL (ref 150–400)
RBC: 5.3 MIL/uL (ref 4.22–5.81)
RDW: 14.7 % (ref 11.5–15.5)
WBC: 11.2 10*3/uL — ABNORMAL HIGH (ref 4.0–10.5)
nRBC: 0 % (ref 0.0–0.2)

## 2019-03-10 LAB — LIPASE, BLOOD: Lipase: 25 U/L (ref 11–51)

## 2019-03-10 LAB — TROPONIN I (HIGH SENSITIVITY)
Troponin I (High Sensitivity): 16 ng/L (ref <18)
Troponin I (High Sensitivity): 15 ng/L (ref <18)

## 2019-03-10 LAB — SARS CORONAVIRUS 2 (TAT 6-24 HRS): SARS Coronavirus 2: NEGATIVE

## 2019-03-10 MED ORDER — SODIUM CHLORIDE 0.9 % IV BOLUS
500.0000 mL | Freq: Once | INTRAVENOUS | Status: AC
  Administered 2019-03-10: 500 mL via INTRAVENOUS

## 2019-03-10 MED ORDER — SODIUM CHLORIDE 0.9 % IV SOLN
INTRAVENOUS | Status: DC
  Administered 2019-03-10 – 2019-03-13 (×3): via INTRAVENOUS

## 2019-03-10 MED ORDER — HEPARIN SODIUM (PORCINE) 5000 UNIT/ML IJ SOLN
5000.0000 [IU] | Freq: Three times a day (TID) | INTRAMUSCULAR | Status: DC
  Administered 2019-03-10 – 2019-03-19 (×24): 5000 [IU] via SUBCUTANEOUS
  Filled 2019-03-10 (×25): qty 1

## 2019-03-10 MED ORDER — HYDROMORPHONE HCL 1 MG/ML IJ SOLN
0.5000 mg | INTRAMUSCULAR | Status: DC | PRN
  Administered 2019-03-14 – 2019-03-23 (×7): 0.5 mg via INTRAVENOUS
  Filled 2019-03-10 (×7): qty 0.5

## 2019-03-10 MED ORDER — POTASSIUM CHLORIDE CRYS ER 10 MEQ PO TBCR
10.0000 meq | EXTENDED_RELEASE_TABLET | Freq: Two times a day (BID) | ORAL | Status: DC
  Administered 2019-03-10 – 2019-03-18 (×16): 10 meq via ORAL
  Filled 2019-03-10 (×19): qty 1

## 2019-03-10 MED ORDER — ONDANSETRON HCL 4 MG/2ML IJ SOLN: 4.0000 mg | Freq: Four times a day (QID) | INTRAMUSCULAR | Status: DC | PRN

## 2019-03-10 MED ORDER — SODIUM CHLORIDE 0.9 % IV SOLN
INTRAVENOUS | Status: DC
  Administered 2019-03-10: 50 mL/h via INTRAVENOUS
  Administered 2019-03-10 – 2019-03-11 (×2): via INTRAVENOUS

## 2019-03-10 MED ORDER — OMEGA-3-ACID ETHYL ESTERS 1 G PO CAPS
1.0000 g | ORAL_CAPSULE | Freq: Every evening | ORAL | Status: DC
  Administered 2019-03-13 – 2019-03-19 (×5): 1 g via ORAL
  Filled 2019-03-10 (×9): qty 1

## 2019-03-10 MED ORDER — VALACYCLOVIR HCL 500 MG PO TABS
1000.0000 mg | ORAL_TABLET | Freq: Two times a day (BID) | ORAL | Status: DC
  Administered 2019-03-10 – 2019-03-16 (×10): 1000 mg via ORAL
  Filled 2019-03-10 (×18): qty 2

## 2019-03-10 MED ORDER — POLYETHYLENE GLYCOL 3350 17 G PO PACK
34.0000 g | PACK | Freq: Every day | ORAL | Status: DC
  Administered 2019-03-11 – 2019-03-13 (×3): 34 g via ORAL
  Filled 2019-03-10 (×3): qty 2

## 2019-03-10 MED ORDER — PANTOPRAZOLE SODIUM 40 MG PO TBEC
40.0000 mg | DELAYED_RELEASE_TABLET | Freq: Two times a day (BID) | ORAL | Status: DC
  Administered 2019-03-11 – 2019-03-18 (×14): 40 mg via ORAL
  Filled 2019-03-10 (×16): qty 1

## 2019-03-10 MED ORDER — ASPIRIN EC 81 MG PO TBEC
81.0000 mg | DELAYED_RELEASE_TABLET | Freq: Every day | ORAL | Status: DC
  Administered 2019-03-11 – 2019-03-19 (×8): 81 mg via ORAL
  Filled 2019-03-10 (×10): qty 1

## 2019-03-10 MED ORDER — OXYCODONE HCL 5 MG PO TABS
5.0000 mg | ORAL_TABLET | ORAL | Status: DC | PRN
  Administered 2019-03-14: 5 mg via ORAL
  Filled 2019-03-10: qty 1

## 2019-03-10 MED ORDER — VITAMIN D 25 MCG (1000 UNIT) PO TABS
2000.0000 [IU] | ORAL_TABLET | Freq: Every day | ORAL | Status: DC
  Administered 2019-03-11 – 2019-03-21 (×9): 2000 [IU] via ORAL
  Filled 2019-03-10 (×9): qty 2

## 2019-03-10 MED ORDER — ONDANSETRON HCL 4 MG/2ML IJ SOLN
4.0000 mg | INTRAMUSCULAR | Status: AC | PRN
  Administered 2019-03-10 – 2019-03-11 (×2): 4 mg via INTRAVENOUS
  Filled 2019-03-10 (×2): qty 2

## 2019-03-10 MED ORDER — LACTULOSE 10 GM/15ML PO SOLN
20.0000 g | Freq: Every day | ORAL | Status: DC
  Administered 2019-03-10 – 2019-03-11 (×2): 20 g via ORAL
  Filled 2019-03-10 (×2): qty 30

## 2019-03-10 MED ORDER — INSULIN GLARGINE 100 UNIT/ML ~~LOC~~ SOLN
15.0000 [IU] | Freq: Every day | SUBCUTANEOUS | Status: DC
  Administered 2019-03-11 – 2019-03-18 (×8): 15 [IU] via SUBCUTANEOUS
  Filled 2019-03-10 (×10): qty 0.15

## 2019-03-10 MED ORDER — HEPARIN SODIUM (PORCINE) 5000 UNIT/ML IJ SOLN: 5000.0000 [IU] | Freq: Three times a day (TID) | INTRAMUSCULAR | Status: DC

## 2019-03-10 MED ORDER — BISACODYL 5 MG PO TBEC
5.0000 mg | DELAYED_RELEASE_TABLET | Freq: Three times a day (TID) | ORAL | Status: AC
  Administered 2019-03-10 – 2019-03-12 (×6): 5 mg via ORAL
  Filled 2019-03-10 (×6): qty 1

## 2019-03-10 MED ORDER — SODIUM CHLORIDE 0.9 % IV SOLN: INTRAVENOUS | Status: DC

## 2019-03-10 MED ORDER — DRONABINOL 2.5 MG PO CAPS
2.5000 mg | ORAL_CAPSULE | Freq: Two times a day (BID) | ORAL | Status: DC
  Administered 2019-03-11 – 2019-03-13 (×5): 2.5 mg via ORAL
  Filled 2019-03-10 (×5): qty 1

## 2019-03-10 MED ORDER — MECLIZINE HCL 12.5 MG PO TABS
12.5000 mg | ORAL_TABLET | Freq: Three times a day (TID) | ORAL | Status: DC | PRN
  Filled 2019-03-10: qty 1

## 2019-03-10 MED ORDER — ONDANSETRON HCL 4 MG PO TABS: 4.0000 mg | ORAL_TABLET | Freq: Four times a day (QID) | ORAL | Status: DC | PRN

## 2019-03-10 MED ORDER — TRAMADOL HCL 50 MG PO TABS: 50.0000 mg | ORAL_TABLET | Freq: Four times a day (QID) | ORAL | Status: DC | PRN

## 2019-03-10 MED ORDER — POLYETHYLENE GLYCOL 3350 17 GM/SCOOP PO POWD: 17.0000 g | Freq: Every day | ORAL | Status: DC

## 2019-03-10 NOTE — ED Triage Notes (Signed)
Pt reports generalized abd pain and nausea x 1 week.  LBM was this morning

## 2019-03-10 NOTE — Care Management Obs Status (Signed)
[place] NOTIFICATION   Patient Details  Name: [name] MRN: [id number] Date of Birth: [birth year]   Medicare Observation Status Notification Given:  Other (see comment)(MOON notice faxed by Boykin Peek, CSW to AP nurse at Fax# [phone])    [place], LCSW 03/08/2019, 5:30 PM
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
## 2019-03-10 NOTE — ED Notes (Signed)
Assisted Pt back to bed and helped put on a new gown.  Pt did have a bm.
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
## 2019-03-11 ENCOUNTER — Other Ambulatory Visit (HOSPITAL_COMMUNITY): Payer: Self-pay | Admitting: Nurse Practitioner

## 2019-03-11 ENCOUNTER — Telehealth: Payer: Self-pay | Admitting: Radiation Oncology

## 2019-03-11 DIAGNOSIS — I10 Essential (primary) hypertension: Secondary | ICD-10-CM | POA: Diagnosis present

## 2019-03-11 DIAGNOSIS — Z66 Do not resuscitate: Secondary | ICD-10-CM | POA: Diagnosis present

## 2019-03-11 DIAGNOSIS — R42 Dizziness and giddiness: Secondary | ICD-10-CM | POA: Diagnosis not present

## 2019-03-11 DIAGNOSIS — J90 Pleural effusion, not elsewhere classified: Secondary | ICD-10-CM | POA: Diagnosis not present

## 2019-03-11 DIAGNOSIS — J9811 Atelectasis: Secondary | ICD-10-CM | POA: Diagnosis present

## 2019-03-11 DIAGNOSIS — E43 Unspecified severe protein-calorie malnutrition: Secondary | ICD-10-CM | POA: Diagnosis present

## 2019-03-11 DIAGNOSIS — I420 Dilated cardiomyopathy: Secondary | ICD-10-CM | POA: Diagnosis present

## 2019-03-11 DIAGNOSIS — R1312 Dysphagia, oropharyngeal phase: Secondary | ICD-10-CM | POA: Diagnosis not present

## 2019-03-11 DIAGNOSIS — R64 Cachexia: Secondary | ICD-10-CM | POA: Diagnosis present

## 2019-03-11 DIAGNOSIS — Z515 Encounter for palliative care: Secondary | ICD-10-CM | POA: Diagnosis not present

## 2019-03-11 DIAGNOSIS — Z7189 Other specified counseling: Secondary | ICD-10-CM | POA: Diagnosis not present

## 2019-03-11 DIAGNOSIS — Z20828 Contact with and (suspected) exposure to other viral communicable diseases: Secondary | ICD-10-CM | POA: Diagnosis present

## 2019-03-11 DIAGNOSIS — J189 Pneumonia, unspecified organism: Secondary | ICD-10-CM | POA: Diagnosis not present

## 2019-03-11 DIAGNOSIS — R627 Adult failure to thrive: Secondary | ICD-10-CM

## 2019-03-11 DIAGNOSIS — E860 Dehydration: Secondary | ICD-10-CM

## 2019-03-11 DIAGNOSIS — I2510 Atherosclerotic heart disease of native coronary artery without angina pectoris: Secondary | ICD-10-CM | POA: Diagnosis present

## 2019-03-11 DIAGNOSIS — J690 Pneumonitis due to inhalation of food and vomit: Secondary | ICD-10-CM | POA: Diagnosis not present

## 2019-03-11 DIAGNOSIS — R112 Nausea with vomiting, unspecified: Secondary | ICD-10-CM

## 2019-03-11 DIAGNOSIS — C220 Liver cell carcinoma: Principal | ICD-10-CM

## 2019-03-11 DIAGNOSIS — K5900 Constipation, unspecified: Secondary | ICD-10-CM

## 2019-03-11 DIAGNOSIS — J9601 Acute respiratory failure with hypoxia: Secondary | ICD-10-CM | POA: Diagnosis not present

## 2019-03-11 DIAGNOSIS — E785 Hyperlipidemia, unspecified: Secondary | ICD-10-CM | POA: Diagnosis present

## 2019-03-11 DIAGNOSIS — I471 Supraventricular tachycardia: Secondary | ICD-10-CM | POA: Diagnosis present

## 2019-03-11 DIAGNOSIS — K921 Melena: Secondary | ICD-10-CM | POA: Diagnosis not present

## 2019-03-11 DIAGNOSIS — A419 Sepsis, unspecified organism: Secondary | ICD-10-CM | POA: Diagnosis not present

## 2019-03-11 DIAGNOSIS — Z681 Body mass index (BMI) 19 or less, adult: Secondary | ICD-10-CM | POA: Diagnosis not present

## 2019-03-11 DIAGNOSIS — E1165 Type 2 diabetes mellitus with hyperglycemia: Secondary | ICD-10-CM | POA: Diagnosis present

## 2019-03-11 DIAGNOSIS — K219 Gastro-esophageal reflux disease without esophagitis: Secondary | ICD-10-CM | POA: Diagnosis present

## 2019-03-11 DIAGNOSIS — R6521 Severe sepsis with septic shock: Secondary | ICD-10-CM | POA: Diagnosis not present

## 2019-03-11 DIAGNOSIS — R110 Nausea: Secondary | ICD-10-CM | POA: Diagnosis not present

## 2019-03-11 DIAGNOSIS — I340 Nonrheumatic mitral (valve) insufficiency: Secondary | ICD-10-CM | POA: Diagnosis not present

## 2019-03-11 DIAGNOSIS — N400 Enlarged prostate without lower urinary tract symptoms: Secondary | ICD-10-CM | POA: Diagnosis present

## 2019-03-11 DIAGNOSIS — I361 Nonrheumatic tricuspid (valve) insufficiency: Secondary | ICD-10-CM | POA: Diagnosis not present

## 2019-03-11 LAB — BASIC METABOLIC PANEL
Anion gap: 11 (ref 5–15)
BUN: 24 mg/dL — ABNORMAL HIGH (ref 8–23)
CO2: 28 mmol/L (ref 22–32)
Calcium: 8.5 mg/dL — ABNORMAL LOW (ref 8.9–10.3)
Chloride: 99 mmol/L (ref 98–111)
Creatinine, Ser: 0.56 mg/dL — ABNORMAL LOW (ref 0.61–1.24)
GFR calc Af Amer: >60 mL/min (ref >60)
GFR calc non Af Amer: >60 mL/min (ref >60)
Glucose, Bld: 178 mg/dL — ABNORMAL HIGH (ref 70–99)
Potassium: 3.9 mmol/L (ref 3.5–5.1)
Sodium: 138 mmol/L (ref 135–145)

## 2019-03-11 LAB — URINE CULTURE

## 2019-03-11 LAB — CBC
HCT: 41.9 % (ref 39.0–52.0)
Hemoglobin: 13.2 g/dL (ref 13.0–17.0)
MCH: 29.3 pg (ref 26.0–34.0)
MCHC: 31.5 g/dL (ref 30.0–36.0)
MCV: 93.1 fL (ref 80.0–100.0)
Platelets: 151 10*3/uL (ref 150–400)
RBC: 4.5 MIL/uL (ref 4.22–5.81)
RDW: 14.6 % (ref 11.5–15.5)
WBC: 8.7 10*3/uL (ref 4.0–10.5)
nRBC: 0 % (ref 0.0–0.2)

## 2019-03-11 MED ORDER — FLEET ENEMA 7-19 GM/118ML RE ENEM
1.0000 | ENEMA | Freq: Once | RECTAL | Status: AC
  Administered 2019-03-11: 1 via RECTAL

## 2019-03-11 MED ORDER — BOOST / RESOURCE BREEZE PO LIQD CUSTOM
1.0000 | Freq: Three times a day (TID) | ORAL | Status: DC
  Administered 2019-03-11: 1 via ORAL

## 2019-03-11 NOTE — Clinical Social Work Note (Signed)
Spoke with patient regarding PT evaluation and recommendation. Patient stated that at baseline is independent and drives. He states that he has a shower chair and a walker that he uses after previous surgery. He ambulates independently currently. Patient deferred to his wife to make the decision regarding discharge.   Mrs. Melroy advised that she would be able to assist patient in the home and was agreeable to HHPT.  Choices given and referral made to [facility] with [facility]    Attending notified.     Kenn Rekowski, Clydene Pugh, LCSW

## 2019-03-11 NOTE — Progress Notes (Signed)
Per previous shift, patient vomited after taking medications. PRN Zofran given this morning prior to medications, will monitor effectiveness. Patient ate breakfast, no reports of pain or nausea at this time. Will continue to monitor.

## 2019-03-11 NOTE — Telephone Encounter (Signed)
  Radiation Oncology         [id number]) [phone] ________________________________  Name: [name] MRN: [id number]  Date of Service: 03/11/2019  DOB: 1941-07-01  Post Treatment Telephone Note  Diagnosis:  Hepatocellular Carcinoma  Interval Since Last Radiation:  4 weeks   01/28/2019-02/11/2019 SBRT Style Treatment: The patient was treated to 50 Gy in 10 fractions to the liver.  Narrative:  The patient was contacted today for routine follow-up. During treatment he did very well with radiotherapy and did not have significant desquamation. He did have difficulty with poor oral intake, nausea, and severe constipation prior to his treatment, as well as throughout his course. He developed progressive abdominal pain in the last few days and is currently at [facility] with abdominal pain. His CT imaging has been reviewed and no acute findings were present, but he was impacted.   Impression/Plan: 1. Hepatocellular Carcinoma. The patient has been doing well since completion of radiotherapy. We discussed that we would be happy to continue to follow him as needed, but he will also continue to follow up with Dr. Delton Coombes in medical oncology.      Carola Rhine, PAC

## 2019-03-11 NOTE — Progress Notes (Signed)
PROGRESS NOTE    QUASEAN FRYE  RWE:[id number] DOB: 07/16/1941 DOA: 03/20/2019 PCP: Susy Frizzle, MD    Brief Narrative:  78 year old male with a history aortic stenosis, diabetes, hypertension, hyperlipidemia, recent diagnosis of hepatocellular carcinoma status post radiation.  Presented to hospital with complaints of abdominal pain, nausea and vomiting.  He was generally weak and having difficulty ambulating.  Patient was admitted for failure to thrive, orthostasis, persistent nausea and vomiting.  Overall symptoms are improving.  Seen by physical therapy with recommendation for skilled nursing facility placement.   Assessment & Plan:   Active Problems:   Essential hypertension   GERD   Constipation   Cardiomyopathy, dilated, nonischemic (HCC)   Failure to thrive in adult   Nausea and vomiting   Protein-calorie malnutrition, severe   1. Orthostasis, secondary to dehydration and volume depletion.  Continue to hold home dose of metoprolol.  Continue gentle hydration.  Repeat orthostatics in a.m. 2. Hepatocellular carcinoma.  Follow-up with Dr. Delton Coombes.  He is completed radiation treatment. 3. Nausea and vomiting.  Patient reports persistent nausea and vomiting prior to admission.  He is currently tolerating clear liquids.  Will advance to solid food as tolerated. 4. Constipation.  CT scan indicated stool burden with possible fecal impaction.  Will give enema. 5. Diabetes.  Continue on sliding scale insulin.  Blood sugar stable. 6. Generalized weakness with failure to thrive.  Secondary to decreased p.o. intake.  Seen by physical therapy with recommendation for skilled nursing facility placement.  He is very weak and having difficulty walking.  Initially, patient's wife wanted to take him home.  Upon further conversation, she does not feel that it would be safe to have the patient at home and agrees with skilled nursing facility placement.   DVT prophylaxis: Heparin Code  Status: DNR Family Communication: Discussed with wife over the phone Disposition Plan: Skilled nursing facility placement.   Consultants:     Procedures:     Antimicrobials:       Subjective: Feels very weak.  Complains of bloating.  He is dizzy on standing.  He has had 1 bowel movement today.  No shortness of breath.  Still having nausea and vomiting  Objective: Vitals:   03/19/2019 1800 03/08/2019 1836 03/27/2019 2122 03/11/19 0543  BP: 133/73  (!) 157/92 117/75  Pulse: (!) 110  (!) 117 (!) 101  Resp: 18 20 16 17   Temp: 98.2 F (36.8 C)  98.2 F (36.8 C) 97.7 F (36.5 C)  TempSrc:   Oral Oral  SpO2: 95%  96% 99%  Weight:  50.2 kg    Height:  5\' 8"  (1.727 m)      Intake/Output Summary (Last 24 hours) at 03/11/2019 1755 Last data filed at 03/11/2019 1500 Gross per 24 hour  Intake 1256.51 ml  Output 600 ml  Net 656.51 ml   Filed Weights   04/02/2019 0944 03/30/2019 1836  Weight: 50.3 kg 50.2 kg    Examination:  General exam: Appears calm and comfortable, cachectic   Respiratory system: Clear to auscultation. Respiratory effort normal. Cardiovascular system: S1 & S2 heard, RRR. No JVD, murmurs, rubs, gallops or clicks. No pedal edema. Gastrointestinal system: Abdomen is nondistended, soft and nontender. No organomegaly or masses felt. Normal bowel sounds heard. Central nervous system: Alert and oriented. No focal neurological deficits. Extremities: Symmetric 5 x 5 power. Skin: No rashes, lesions or ulcers Psychiatry: Judgement and insight appear normal. Mood & affect appropriate.     Data Reviewed: I  have personally reviewed following labs and imaging studies  CBC: Recent Labs  Lab 03/07/2019 1045 03/11/19 0632  WBC 11.2* 8.7  NEUTROABS 10.2*  --   HGB 15.5 13.2  HCT 48.1 41.9  MCV 90.8 93.1  PLT 219 588   Basic Metabolic Panel: Recent Labs  Lab 03/25/2019 1045 03/31/2019 1114 03/11/19 0632  NA 136  --  138  K 4.2  --  3.9  CL 94*  --  99  CO2 29  --  28   GLUCOSE 217*  --  178*  BUN 29*  --  24*  CREATININE 0.79  --  0.56*  CALCIUM 9.2  --  8.5*  MG  --  2.4  --    GFR: Estimated Creatinine Clearance: 53.2 mL/min (A) (by C-G formula based on SCr of 0.56 mg/dL (L)). Liver Function Tests: Recent Labs  Lab 04/02/2019 1045  AST 66*  ALT 94*  ALKPHOS 563*  BILITOT 1.9*  PROT 7.8  ALBUMIN 3.2*   Recent Labs  Lab 03/16/2019 1045  LIPASE 25   No results for input(s): AMMONIA in the last 168 hours. Coagulation Profile: No results for input(s): INR, PROTIME in the last 168 hours. Cardiac Enzymes: No results for input(s): CKTOTAL, CKMB, CKMBINDEX, TROPONINI in the last 168 hours. BNP (last 3 results) No results for input(s): PROBNP in the last 8760 hours. HbA1C: No results for input(s): HGBA1C in the last 72 hours. CBG: No results for input(s): GLUCAP in the last 168 hours. Lipid Profile: No results for input(s): CHOL, HDL, LDLCALC, TRIG, CHOLHDL, LDLDIRECT in the last 72 hours. Thyroid Function Tests: No results for input(s): TSH, T4TOTAL, FREET4, T3FREE, THYROIDAB in the last 72 hours. Anemia Panel: No results for input(s): VITAMINB12, FOLATE, FERRITIN, TIBC, IRON, RETICCTPCT in the last 72 hours. Sepsis Labs: No results for input(s): PROCALCITON, LATICACIDVEN in the last 168 hours.  Recent Results (from the past 240 hour(s))  SARS Coronavirus 2 (Performed in [place] hospital lab)     Status: None   Collection Time: 03/02/19  9:56 AM   Specimen: Nasal Swab  Result Value Ref Range Status   SARS Coronavirus 2 NEGATIVE NEGATIVE Final    Comment: (NOTE) SARS-CoV-2 target nucleic acids are NOT DETECTED. The SARS-CoV-2 RNA is generally detectable in upper and lower respiratory specimens during the acute phase of infection. Negative results do not preclude SARS-CoV-2 infection, do not rule out co-infections with other pathogens, and should not be used as the sole basis for treatment or other patient management  decisions. Negative results must be combined with clinical observations, patient history, and epidemiological information. The expected result is Negative. Fact Sheet for Patients: SugarRoll.be Fact Sheet for Healthcare Providers: https://www.woods-mathews.com/ This test is not yet approved or cleared by the [place] FDA and  has been authorized for detection and/or diagnosis of SARS-CoV-2 by FDA under an Emergency Use Authorization (EUA). This EUA will remain  in effect (meaning this test can be used) for the duration of the COVID-19 declaration under Section 56 4(b)(1) of the Act, 21 U.S.C. section 360bbb-3(b)(1), unless the authorization is terminated or revoked sooner. Performed at [facility], [place] [address], [place], [place] [address]   Urine culture     Status: None   Collection Time: 03/14/2019  9:57 AM   Specimen: Urine, Clean Catch  Result Value Ref Range Status   Specimen Description   Final    URINE, CLEAN CATCH Performed at [facility], [address], [place],  [place] [address]    Special Requests   Final    NONE Performed at [facility], [address] [place], [place] [address]    Culture   Final    Multiple bacterial morphotypes present, none predominant. Suggest appropriate recollection if clinically indicated.   Report Status 03/11/2019 FINAL  Final  SARS CORONAVIRUS 2 Nasal Swab Aptima Multi Swab     Status: None   Collection Time: 03/08/2019 11:12 AM   Specimen: Aptima Multi Swab; Nasal Swab  Result Value Ref Range Status   SARS Coronavirus 2 NEGATIVE NEGATIVE Final    Comment: (NOTE) SARS-CoV-2 target nucleic acids are NOT DETECTED. The SARS-CoV-2 RNA is generally detectable in upper and lower respiratory specimens during the acute phase of infection. Negative results do not preclude SARS-CoV-2 infection, do not rule out co-infections with other pathogens, and should not be used as the sole basis for  treatment or other patient management decisions. Negative results must be combined with clinical observations, patient history, and epidemiological information. The expected result is Negative. Fact Sheet for Patients: SugarRoll.be Fact Sheet for Healthcare Providers: https://www.woods-mathews.com/ This test is not yet approved or cleared by the [place] FDA and  has been authorized for detection and/or diagnosis of SARS-CoV-2 by FDA under an Emergency Use Authorization (EUA). This EUA will remain  in effect (meaning this test can be used) for the duration of the COVID-19 declaration under Section 56 4(b)(1) of the Act, 21 U.S.C. section 360bbb-3(b)(1), unless the authorization is terminated or revoked sooner. Performed at [facility], [place] [address], [place], [place] [address]          Radiology Studies: Ct Abdomen Pelvis Wo Contrast  Result Date: 03/09/2019 CLINICAL DATA:  Generalized abdominal pain and nausea for 1 week. EXAM: CT ABDOMEN AND PELVIS WITHOUT CONTRAST TECHNIQUE: Multidetector CT imaging of the abdomen and pelvis was performed following the standard protocol without IV contrast. COMPARISON:  12/09/2018 FINDINGS: Lower chest: There are small bilateral pleural effusions and bibasilar atelectasis. Area of dense subpleural scarring type changes noted in the right lower lobe medially. This has been present since 2015 but appears slightly larger. It measures approximately 2.7 x 2.4 cm and in 2015 measured 18 x 13 mm. Attention on future scans is suggested. Hepatobiliary: Stable nodular contour the liver and vague low-attenuation lesion in the central liver. Gallbladder demonstrates numerous small layering gallstones. No definite findings for acute cholecystitis. No common bile duct dilatation. Pancreas: No mass, inflammation or ductal dilatation. Moderate pancreatic atrophy. Spleen: Borderline splenomegaly.  No focal lesions.  Adrenals/Urinary Tract: Adrenal glands and kidneys are unremarkable. No worrisome renal lesions or renal calculi. The bladder is grossly normal. Stomach/Bowel: The stomach, duodenum, small bowel and colon are grossly normal without oral contrast. No acute inflammatory process, mass lesions or obstructive findings. There is moderate stool in the transverse and descending colon and a large amount of stool in the rectum which could suggest fecal impaction. Vascular/Lymphatic: Advanced atherosclerotic calcifications involving the aorta and iliac arteries and branch vessels. Reproductive: The prostate gland and seminal vesicles are grossly normal. Moderate obscuration by artifact from a left hip prosthesis. Other: No obvious pelvic mass or pelvic lymphadenopathy. No inguinal mass or hernia. Musculoskeletal: Left hip prosthesis with significant artifact. No acute bony findings or destructive bony changes. Advanced facet disease noted in the lumbar spine. IMPRESSION: 1. Exam limited by patient motion and lack of IV and oral contrast. 2. Cholelithiasis but no definite CT findings for acute cholecystitis. 3. Stable  cirrhotic changes involving the liver and vague area of low attenuation in the central liver. 4. No obvious acute abdominal/pelvic findings or lymphadenopathy. 5. Moderate stool in the transverse and descending colon and a large amount of stool in the rectum suggesting fecal impaction. 6. Advanced atherosclerotic calcifications throughout the abdomen and pelvis. No focal aneurysm. Electronically Signed   By: Marijo Sanes M.D.   On: 03/12/2019 10:49   Dg Chest 2 View  Result Date: 03/19/2019 CLINICAL DATA:  Generalized abdominal pain and nausea. EXAM: CHEST - 2 VIEW COMPARISON:  Abdominal CT 03/27/2019 and chest radiograph 03/02/2019 FINDINGS: Again noted is blunting at the costophrenic angles compatible with minimal pleural fluid or scarring based on the recent CT. Postsurgical changes compatible with aortic  valve replacement. Stable scarring along the left lung apex. Heart size is within normal limits and stable. No pulmonary edema or significant airspace disease. No acute bone abnormality. IMPRESSION: 1. No acute cardiopulmonary disease. 2. Chronic blunting at the costophrenic angles compatible scarring and/or minimal pleural fluid based on recent CT. Electronically Signed   By: Markus Daft M.D.   On: 04/02/2019 10:57        Scheduled Meds:  aspirin EC  81 mg Oral Daily   bisacodyl  5 mg Oral TID   cholecalciferol  2,000 Units Oral Daily   dronabinol  2.5 mg Oral BID AC   feeding supplement  1 Container Oral TID BM   heparin  5,000 Units Subcutaneous Q8H   insulin glargine  15 Units Subcutaneous Daily   lactulose  20 g Oral QHS   omega-3 acid ethyl esters  1 g Oral QPM   pantoprazole  40 mg Oral BID AC   polyethylene glycol  34 g Oral Daily   potassium chloride  10 mEq Oral BID   valACYclovir  1,000 mg Oral BID   Continuous Infusions:  sodium chloride 50 mL/hr at 03/11/19 1500   sodium chloride Stopped (03/25/2019 1728)     LOS: 0 days    Time spent: 44mins    Kathie Dike, MD [facility]   If 7PM-7AM, please contact night-coverage [email or web address]  03/11/2019, 5:55 PM

## 2019-03-11 NOTE — Plan of Care (Signed)
  Problem: Acute Rehab PT Goals(only PT should resolve) Goal: Pt Will Go Supine/Side To Sit Outcome: Progressing Flowsheets (Taken 03/11/2019 1032) Pt will go Supine/Side to Sit: with modified independence Goal: Patient Will Transfer Sit To/From Stand Outcome: Progressing Flowsheets (Taken 03/11/2019 1032) Patient will transfer sit to/from stand: with min guard assist Goal: Pt Will Transfer Bed To Chair/Chair To Bed Outcome: Progressing Flowsheets (Taken 03/11/2019 1032) Pt will Transfer Bed to Chair/Chair to Bed: min guard assist Goal: Pt Will Ambulate Outcome: Progressing Flowsheets (Taken 03/11/2019 1032) Pt will Ambulate:  with min guard assist  with minimal assist  with rolling walker   10:33 AM, 03/11/19 Lonell Grandchild, MPT Physical Therapist with [facility] [phone] office [phone] mobile phone

## 2019-03-11 NOTE — Progress Notes (Signed)
Patient was given night time medications, but immediately threw them up. Zofran given with adequate relief.

## 2019-03-11 NOTE — Evaluation (Signed)
Physical Therapy Evaluation Patient Details Name: [name] MRN: [id number] DOB: 01-18-1941 Today's Date: 03/11/2019   History of Present Illness  [name]  is a 78 y.o. 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 Delton Coombes , she presents today to multiple complaints, including abdominal pain, nausea, vomiting, generalized weakness, patient was at oncology office yesterday, he received 1 L of IV fluids at infusion center, wife patient remains significantly weak, with nausea, and vomiting, with no oral intake, as well complaining of abdominal pain, mainly post radiation, no improvement with Zofran which prompted her to bring him to ED, patient denies fever, chills, chest pain, shortness of breath, diarrhea .    Clinical Impression  Patient demonstrates slightly labored movement for sitting up at bedside, very unsteady on feet with near loss of balance when taking steps without AD, required use of RW for safety, frequent scissoring of legs during ambulation in hallway with poor carryover for self correcting, and limited secondary to fatigue.  Patient tolerated sitting up in chair after therapy - RN notified.  Patient will benefit from continued physical therapy in hospital and recommended venue below to increase strength, balance, endurance for safe ADLs and gait.    Follow Up Recommendations SNF;Supervision for mobility/OOB;Supervision - Intermittent    Equipment Recommendations  None recommended by PT    Recommendations for Other Services       Precautions / Restrictions Precautions Precautions: Fall Restrictions Weight Bearing Restrictions: No      Mobility  Bed Mobility Overal bed mobility: Needs Assistance Bed Mobility: Supine to Sit      Supine to sit: Min assist     General bed mobility comments: slow labored movement  Transfers Overall transfer level: Needs assistance Equipment used: None;Rolling walker (2 wheeled) Transfers: Sit to/from [facility] Sit to Stand: Min guard;Min assist Stand pivot transfers: Min guard;Min assist       General transfer comment: unsteady labored movement having to lean on nearby objects without AD, required RW for safety  Ambulation/Gait Ambulation/Gait assistance: Min assist;Mod assist Gait Distance (Feet): 35 Feet Assistive device: Rolling walker (2 wheeled) Gait Pattern/deviations: Decreased step length - right;Decreased step length - left;Decreased stride length;Scissoring Gait velocity: decreased   General Gait Details: very unsteady with near loss of balance without AD, required use of RW for safety, frequent scissoring of legs during ambulation with fair/poor carryover for correcting, limited secondary to fatigue  Stairs            Wheelchair Mobility    Modified Rankin (Stroke Patients Only)       Balance Overall balance assessment: Needs assistance Sitting-balance support: Feet supported;No upper extremity supported Sitting balance-Leahy Scale: Fair Sitting balance - Comments: fair/good seated at bedside   Standing balance support: During functional activity;No upper extremity supported Standing balance-Leahy Scale: Poor Standing balance comment: fair using RW                             Pertinent Vitals/Pain Pain Assessment: No/denies pain    Home Living Family/patient expects to be discharged to:: Private residence Living Arrangements: Spouse/significant other Available Help at Discharge: Family Type of Home: House Home Access: Stairs to enter Entrance Stairs-Rails: Left Entrance Stairs-Number of Steps: 4 Home Layout: One level Home Equipment: Clinical cytogeneticist -  2 wheels      Prior Function Level of  Independence: Independent         Comments: houehold ambulator without AD, was walking farther before becoming sick     Hand Dominance        Extremity/Trunk Assessment   Upper Extremity Assessment Upper Extremity Assessment: Generalized weakness    Lower Extremity Assessment Lower Extremity Assessment: Generalized weakness    Cervical / Trunk Assessment Cervical / Trunk Assessment: Normal  Communication   Communication: No difficulties  Cognition Arousal/Alertness: Awake/alert Behavior During Therapy: WFL for tasks assessed/performed Overall Cognitive Status: Within Functional Limits for tasks assessed                                        General Comments      Exercises     Assessment/Plan    PT Assessment Patient needs continued PT services  PT Problem List Decreased strength;Decreased activity tolerance;Decreased balance;Decreased mobility       PT Treatment Interventions Gait training;Stair training;Functional mobility training;Therapeutic activities;Therapeutic exercise;Patient/family education    PT Goals (Current goals can be found in the Care Plan section)  Acute Rehab PT Goals Patient Stated Goal: return  home with spouse to assist PT Goal Formulation: With patient Time For Goal Achievement: 03/25/19 Potential to Achieve Goals: Good    Frequency Min 3X/week   Barriers to discharge        Co-evaluation               AM-PAC PT "6 Clicks" Mobility  Outcome Measure Help needed turning from your back to your side while in a flat bed without using bedrails?: None Help needed moving from lying on your back to sitting on the side of a flat bed without using bedrails?: A Little Help needed moving to and from a bed to a chair (including a wheelchair)?: A Little Help needed standing up from a chair using your arms (e.g., wheelchair or bedside chair)?: A Little Help needed to walk in hospital room?: A Lot Help needed climbing  3-5 steps with a railing? : A Lot 6 Click Score: 17    End of Session Equipment Utilized During Treatment: Gait belt Activity Tolerance: Patient tolerated treatment well;Patient limited by fatigue Patient left: in chair;with call bell/phone within reach Nurse Communication: Mobility status PT Visit Diagnosis: Unsteadiness on feet (R26.81);Other abnormalities of gait and mobility (R26.89);Muscle weakness (generalized) (M62.81)    Time: 8250-5397 PT Time Calculation (min) (ACUTE ONLY): 24 min   Charges:   PT Evaluation $PT Eval Moderate Complexity: 1 Mod PT Treatments $Therapeutic Activity: 23-37 mins        10:26 AM, 03/11/19 Lonell Grandchild, MPT Physical Therapist with [facility] [phone] office [phone] mobile phone

## 2019-03-11 NOTE — Progress Notes (Signed)
Initial Nutrition Assessment  DOCUMENTATION CODES:   Severe malnutrition in context of chronic illness  INTERVENTION:  Boost Breeze po TID, each supplement provides 250 kcal and 9 grams of protein   Recommend liberalized diet as he is cleared for advancement   NUTRITION DIAGNOSIS:   Severe Malnutrition related to cancer and cancer related treatments as evidenced by per patient/family report, energy intake < or equal to 75% for > or equal to 1 month, percent weight loss.   GOAL:   Patient will meet greater than or equal to 90% of their needs  MONITOR:   Diet advancement, Supplement acceptance, Labs, Weight trends, PO intake REASON FOR ASSESSMENT:   Consult Assessment of nutrition requirement/status  ASSESSMENT: Patient is a 78 yo male with hx of DM, GERD, Constipation, CAD, HTN, Vitamin D deficiency and hepatocellular carcinoma (followed at [facility]).    Talked with patient on telephone. He had a vomiting episode last night but denies nausea today. Clear liquids provided. Intake at home has been very poor per patient especially since he started treatment. He is taking an appetite stimulant currently. Will start Boost breeze while he is on clears and change to Ensure once his diet advances.  Weight loss of 10% in just 2 months which is severe. Meets criteria for malnutrition based on wt loss and poor intake.   Medications reviewed and include: dulcolax, vitamin D3, Marinol, Lantus, Lactulose, Omega-3, Protonix.   Labs: BMP Latest Ref Rng & Units 03/11/2019 03/09/2019 02/26/2019  Glucose 70 - 99 mg/dL 178(H) 217(H) 287(H)  BUN 8 - 23 mg/dL 24(H) 29(H) 15  Creatinine 0.61 - 1.24 mg/dL 0.56(L) 0.79 0.67  BUN/Creat Ratio 6 - 22 (calc) - - -  Sodium 135 - 145 mmol/L 138 136 134(L)  Potassium 3.5 - 5.1 mmol/L 3.9 4.2 4.5  Chloride 98 - 111 mmol/L 99 94(L) 96(L)  CO2 22 - 32 mmol/L 28 29 29   Calcium 8.9 - 10.3 mg/dL 8.5(L) 9.2 9.6     NUTRITION - FOCUSED PHYSICAL EXAM:  Unable to  complete Nutrition-Focused physical exam at this time.     Diet Order:   Diet Order            Diet clear liquid Room service appropriate? Yes; Fluid consistency: Thin  Diet effective now              EDUCATION NEEDS:   Education needs have been addressed   Skin:  Skin Assessment: Skin Integrity Issues: Skin Integrity Issues:: Stage I Stage I: sacrum  Last BM:  8/4  Height:   Ht Readings from Last 1 Encounters:  03/30/2019 5\' 8"  (1.727 m)    Weight:   Wt Readings from Last 1 Encounters:  03/13/2019 50.2 kg    Ideal Body Weight:  70 kg  BMI:  Body mass index is 16.83 kg/m.  Estimated Nutritional Needs:   Kcal:  1740-2000 (35-40 kcal/kg/bw)  Protein:  75-85 gr (1.5-1.7 gr/kg/bw)  Fluid:  1500 ml daily   Colman Cater MS,RD,CSG,LDN Office: [phone] Pager: [phone]
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
## 2019-03-12 LAB — BASIC METABOLIC PANEL
Anion gap: 5 (ref 5–15)
BUN: 15 mg/dL (ref 8–23)
CO2: 25 mmol/L (ref 22–32)
Calcium: 8 mg/dL — ABNORMAL LOW (ref 8.9–10.3)
Chloride: 104 mmol/L (ref 98–111)
Creatinine, Ser: 0.47 mg/dL — ABNORMAL LOW (ref 0.61–1.24)
GFR calc Af Amer: >60 mL/min (ref >60)
GFR calc non Af Amer: >60 mL/min (ref >60)
Glucose, Bld: 98 mg/dL (ref 70–99)
Potassium: 2.8 mmol/L — ABNORMAL LOW (ref 3.5–5.1)
Sodium: 134 mmol/L — ABNORMAL LOW (ref 135–145)

## 2019-03-12 LAB — CBC
HCT: 38 % — ABNORMAL LOW (ref 39.0–52.0)
Hemoglobin: 12.2 g/dL — ABNORMAL LOW (ref 13.0–17.0)
MCH: 29.6 pg (ref 26.0–34.0)
MCHC: 32.1 g/dL (ref 30.0–36.0)
MCV: 92.2 fL (ref 80.0–100.0)
Platelets: 106 10*3/uL — ABNORMAL LOW (ref 150–400)
RBC: 4.12 MIL/uL — ABNORMAL LOW (ref 4.22–5.81)
RDW: 14.2 % (ref 11.5–15.5)
WBC: 6.1 10*3/uL (ref 4.0–10.5)
nRBC: 0 % (ref 0.0–0.2)

## 2019-03-12 MED ORDER — SENNOSIDES-DOCUSATE SODIUM 8.6-50 MG PO TABS
2.0000 | ORAL_TABLET | Freq: Two times a day (BID) | ORAL | Status: DC
  Administered 2019-03-13 (×2): 2 via ORAL
  Filled 2019-03-12 (×2): qty 2

## 2019-03-12 MED ORDER — ENSURE ENLIVE PO LIQD
237.0000 mL | Freq: Two times a day (BID) | ORAL | Status: DC
  Administered 2019-03-12 – 2019-03-19 (×10): 237 mL via ORAL

## 2019-03-12 MED ORDER — POTASSIUM CHLORIDE CRYS ER 20 MEQ PO TBCR
40.0000 meq | EXTENDED_RELEASE_TABLET | ORAL | Status: AC
  Administered 2019-03-12 (×2): 40 meq via ORAL
  Filled 2019-03-12 (×2): qty 2

## 2019-03-12 MED ORDER — BISACODYL 10 MG RE SUPP
10.0000 mg | Freq: Once | RECTAL | Status: DC
  Filled 2019-03-12: qty 1

## 2019-03-12 NOTE — Progress Notes (Signed)
Spoke with patient's daughter Elzia Hott regarding patient's condition. States she will be bringing patient's wife to visit and will also bring his dentures to assist in eating.

## 2019-03-12 NOTE — Progress Notes (Signed)
Physical Therapy Treatment Patient Details Name: [name] MRN: [id number] DOB: 01/04/1941 Today's Date: 03/12/2019    History of Present Illness [name]  is a 78 y.o. 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 Delton Coombes , she presents today to multiple complaints, including abdominal pain, nausea, vomiting, generalized weakness, patient was at oncology office yesterday, he received 1 L of IV fluids at infusion center, wife patient remains significantly weak, with nausea, and vomiting, with no oral intake, as well complaining of abdominal pain, mainly post radiation, no improvement with Zofran which prompted her to bring him to ED, patient denies fever, chills, chest pain, shortness of breath, diarrhea .    PT Comments    Pt much more functional today compared to yesterday.  Pt may be able to have [facility] vs. SNF.   Follow Up Recommendations  SNF;Supervision for mobility/OOB;Supervision - Intermittent;Home health PT     Equipment Recommendations  None recommended by PT    Recommendations for Other Services       Precautions / Restrictions Precautions Precautions: Fall Precaution Comments: Pt states he fell at home prior to coming to the hospital.  States that he does not fall often. Restrictions Weight Bearing Restrictions: No    Mobility  Bed Mobility Overal bed mobility: Needs Assistance Bed Mobility: Supine to Sit     Supine to sit: Min assist     General bed mobility comments: slow labored movement  Transfers Overall transfer level: Modified independent Equipment used: None;Rolling walker (2 wheeled) Transfers: Sit to/from [facility] Sit to Stand: Supervision Stand pivot transfers: Supervision       General  transfer comment: unsteady labored movement having to lean on nearby objects without AD, required RW for safety  Ambulation/Gait Ambulation/Gait assistance: Supervision Gait Distance (Feet): 200 Feet Assistive device: Rolling walker (2 wheeled) Gait Pattern/deviations: Decreased step length - right;Decreased step length - left;Decreased stride length;Scissoring Gait velocity: wnl for age   General Gait Details: NO unsteadiness noted with walker this session   Stairs             Wheelchair Mobility    Modified Rankin (Stroke Patients Only)          Cognition Arousal/Alertness: Awake/alert Behavior During Therapy: WFL for tasks assessed/performed Overall Cognitive Status: Within Functional Limits for tasks assessed                                        Exercises  Nurse waiting to give meds did not complete exercises.    General Comments        Pertinent Vitals/Pain Pain Assessment: No/denies pain    Home Living Family/patient expects to be discharged to:: Private residence Living Arrangements: Spouse/significant other Available Help at Discharge: Family Type of Home: House Home Access: Stairs to enter Entrance Stairs-Rails: Left Home Layout: One level Home Equipment: Clinical cytogeneticist - 2 wheels      Prior Function Level of Independence: Independent      Comments: houehold ambulator without AD, was walking farther before becoming sick   PT Goals (current goals can now be found in the care plan section) Acute Rehab PT Goals Patient Stated Goal: return  home with spouse to assist PT Goal Formulation: With  patient Time For Goal Achievement: 03/25/19 Potential to Achieve Goals: Good    Frequency    Min 3X/week      PT Plan  Pt to continue acute rehab to improve activity tolerance.           End of Session Equipment Utilized During Treatment: Gait belt Activity Tolerance: Patient tolerated treatment well Patient left: in  chair;with call bell/phone within reach Nurse Communication: Mobility status PT Visit Diagnosis: Unsteadiness on feet (R26.81);Other abnormalities of gait and mobility (R26.89);Muscle weakness (generalized) (M62.81)     Time: 6754-4920 PT Time Calculation (min) (ACUTE ONLY): 21 min  Charges:  $Gait Training: 8-22 mins                     Rayetta Humphrey, [place] [phone]  03/12/2019, 4:00 PM

## 2019-03-12 NOTE — Progress Notes (Signed)
PROGRESS NOTE    [name]  HGD:[id number] DOB: 23-Jan-1941 DOA: 04/04/2019 PCP: Susy Frizzle, MD    Brief Narrative:  78 year old male with a history aortic stenosis, diabetes, hypertension, hyperlipidemia, recent diagnosis of hepatocellular carcinoma status post radiation.  Presented to hospital with complaints of abdominal pain, nausea and vomiting.  He was generally weak and having difficulty ambulating.  Patient was admitted for failure to thrive, orthostasis, persistent nausea and vomiting.  Overall symptoms are improving.  Seen by physical therapy with recommendation for skilled nursing facility placement.   Assessment & Plan:   Active Problems:   Essential hypertension   GERD   Constipation   Cardiomyopathy, dilated, nonischemic (HCC)   Cancer, hepatocellular (HCC)   Failure to thrive in adult   Nausea in adult patient   Protein-calorie malnutrition, severe   Dehydration   1. Orthostasis, secondary to dehydration and volume depletion.  Continue to hold home dose of metoprolol.  Continue gentle hydration.  Repeat orthostatics if still positive - -Dizziness with ambulation, blood pressure dropped down to 98/65 from 123/60 from laying to sitting position, patient with some dyspnea on exertion  2. hepatocellular carcinoma.  Follow-up with Dr. Delton Coombes.  He is completed radiation treatment. 3. Nausea and vomiting.--Improved, continue to advance diet as tolerated   4. Constipation.  CT scan indicated stool burden with possible fecal impaction.  Received enema and laxatives with some results 5. Diabetes.  Continue on sliding scale insulin.  Blood sugar stable. 6. Generalized weakness with failure to thrive.  Secondary to decreased p.o. intake.  Seen by physical therapy with recommendation for skilled nursing facility placement.  He is very weak and having difficulty walking.   --- Patient's wife was able to visit him today on 03/12/2019 face-to-face, she thinks she maybe  able  to take him home with home health on 03/13/2019 after her ophthalmology appointment, if wife is unable to take him home patient will go to SNF for rehab  7)Hypokalemia--potassium is down to 2.8, replace and recheck  DVT prophylaxis: Heparin Code Status: DNR Family Communication: Discussed with wife [name] over the phone--patient's wife was able to visit him on 03/12/2019 Disposition Plan:  --Remains asymptomatic, Dizziness with ambulation, blood pressure dropped down to 98/65 from 123/60 from laying to sitting position, patient with some dyspnea on exertion ---skilled nursing facility placement. Vs HH    Consultants:     Procedures:     Antimicrobials:       Subjective: Dizziness with ambulation, blood pressure dropped down to 98/65 from 123/60 from laying to sitting position, patient with some dyspnea on exertion --No frank chest pains  Objective: Vitals:   03/11/19 2034 03/11/19 2200 03/12/19 0557 03/12/19 1526  BP:  115/68 121/62 126/63  Pulse:  (!) 105 (!) 101 100  Resp:  18 16 16   Temp:  98.6 F (37 C) 98.1 F (36.7 C) 97.9 F (36.6 C)  TempSrc:  Oral Oral Oral  SpO2: 91% 97% 98% 97%  Weight:      Height:        Intake/Output Summary (Last 24 hours) at 03/12/2019 1755 Last data filed at 03/12/2019 1311 Gross per 24 hour  Intake 840 ml  Output 1175 ml  Net -335 ml   Filed Weights   03/26/2019 0944 03/20/2019 1836  Weight: 50.3 kg 50.2 kg    Examination:  General exam: Appears calm and comfortable, cachectic   Respiratory system: Mostly clear, but does have some dyspnea on exertion  cardiovascular system:  S1 & S2 heard, RRR.  Patient with dizziness with attempt to ambulate  gastrointestinal system: Abdomen is nondistended, soft and nontender. No organomegaly or masses felt. Normal bowel sounds heard. Central nervous system: Alert and oriented.  Patient with generalized weakness, no focal neurological deficits. Extremities: Symmetric 5 x 5 power. Skin: No  rashes, lesions or ulcers Psychiatry: Judgement and insight appear normal. Mood & affect appropriate.     Data Reviewed:  CBC: Recent Labs  Lab 03/19/2019 1045 03/11/19 0632 03/12/19 0446  WBC 11.2* 8.7 6.1  NEUTROABS 10.2*  --   --   HGB 15.5 13.2 12.2*  HCT 48.1 41.9 38.0*  MCV 90.8 93.1 92.2  PLT 219 151 557*   Basic Metabolic Panel: Recent Labs  Lab 03/31/2019 1045 03/27/2019 1114 03/11/19 0632 03/12/19 0446  NA 136  --  138 134*  K 4.2  --  3.9 2.8*  CL 94*  --  99 104  CO2 29  --  28 25  GLUCOSE 217*  --  178* 98  BUN 29*  --  24* 15  CREATININE 0.79  --  0.56* 0.47*  CALCIUM 9.2  --  8.5* 8.0*  MG  --  2.4  --   --    GFR: Estimated Creatinine Clearance: 53.2 mL/min (A) (by C-G formula based on SCr of 0.47 mg/dL (L)). Liver Function Tests: Recent Labs  Lab 03/25/2019 1045  AST 66*  ALT 94*  ALKPHOS 563*  BILITOT 1.9*  PROT 7.8  ALBUMIN 3.2*   Recent Labs  Lab 03/19/2019 1045  LIPASE 25   No results for input(s): AMMONIA in the last 168 hours. Coagulation Profile: No results for input(s): INR, PROTIME in the last 168 hours. Cardiac Enzymes: No results for input(s): CKTOTAL, CKMB, CKMBINDEX, TROPONINI in the last 168 hours. BNP (last 3 results) No results for input(s): PROBNP in the last 8760 hours. HbA1C: No results for input(s): HGBA1C in the last 72 hours. CBG: No results for input(s): GLUCAP in the last 168 hours. Lipid Profile: No results for input(s): CHOL, HDL, LDLCALC, TRIG, CHOLHDL, LDLDIRECT in the last 72 hours. Thyroid Function Tests: No results for input(s): TSH, T4TOTAL, FREET4, T3FREE, THYROIDAB in the last 72 hours. Anemia Panel: No results for input(s): VITAMINB12, FOLATE, FERRITIN, TIBC, IRON, RETICCTPCT in the last 72 hours. Sepsis Labs: No results for input(s): PROCALCITON, LATICACIDVEN in the last 168 hours.  Recent Results (from the past 240 hour(s))  Urine culture     Status: None   Collection Time: 03/08/2019  9:57 AM    Specimen: Urine, Clean Catch  Result Value Ref Range Status   Specimen Description   Final    URINE, CLEAN CATCH Performed at [facility], [address] [place], [place] [address]    Special Requests   Final    NONE Performed at [facility], [address], [place], [place] [address]    Culture   Final    Multiple bacterial morphotypes present, none predominant. Suggest appropriate recollection if clinically indicated.   Report Status 03/11/2019 FINAL  Final  SARS CORONAVIRUS 2 Nasal Swab Aptima Multi Swab     Status: None   Collection Time: 03/11/2019 11:12 AM   Specimen: Aptima Multi Swab; Nasal Swab  Result Value Ref Range Status   SARS Coronavirus 2 NEGATIVE NEGATIVE Final    Comment: (NOTE) SARS-CoV-2 target nucleic acids are NOT DETECTED. The SARS-CoV-2 RNA is generally detectable in upper and lower respiratory specimens during the acute phase of infection.  Negative results do not preclude SARS-CoV-2 infection, do not rule out co-infections with other pathogens, and should not be used as the sole basis for treatment or other patient management decisions. Negative results must be combined with clinical observations, patient history, and epidemiological information. The expected result is Negative. Fact Sheet for Patients: SugarRoll.be Fact Sheet for Healthcare Providers: https://www.woods-mathews.com/ This test is not yet approved or cleared by the [place] FDA and  has been authorized for detection and/or diagnosis of SARS-CoV-2 by FDA under an Emergency Use Authorization (EUA). This EUA will remain  in effect (meaning this test can be used) for the duration of the COVID-19 declaration under Section 56 4(b)(1) of the Act, 21 U.S.C. section 360bbb-3(b)(1), unless the authorization is terminated or revoked sooner. Performed at [facility], [place] [address] [place], [place] [address]          Radiology Studies: No  results found.      Scheduled Meds: . aspirin EC  81 mg Oral Daily  . cholecalciferol  2,000 Units Oral Daily  . dronabinol  2.5 mg Oral BID AC  . feeding supplement (ENSURE ENLIVE)  237 mL Oral BID BM  . heparin  5,000 Units Subcutaneous Q8H  . insulin glargine  15 Units Subcutaneous Daily  . lactulose  20 g Oral QHS  . omega-3 acid ethyl esters  1 g Oral QPM  . pantoprazole  40 mg Oral BID AC  . polyethylene glycol  34 g Oral Daily  . potassium chloride  10 mEq Oral BID  . valACYclovir  1,000 mg Oral BID   Continuous Infusions: . sodium chloride 75 mL/hr at 03/12/19 1107     LOS: 1 day    Time spent: 4mins    Roxan Hockey, MD [facility]   If 7PM-7AM, please contact night-coverage [email or web address]  03/12/2019, 5:55 PM

## 2019-03-12 NOTE — TOC Initial Note (Signed)
Transition of Care [facility]) - Initial/Assessment Note    Patient Details  Name: [name] MRN: [id number] Date of Birth: September 29, 1940  [facility]) CM/SW Contact:    Ihor Gully, LCSW Phone Number: 03/12/2019, 1:39 PM  Clinical Narrative:                 Spoke with patient regarding PT evaluation and recommendation. Patient stated that at baseline is independent and drives. He states that he has a shower chair and a walker that he uses after previous surgery. He ambulates independently currently. Patient deferred to his wife to make the decision regarding discharge.   Mrs. Hirsch advised that she would be able to assist patient in the home and was agreeable to HHPT.  Choices given and referral made to [facility] with [facility] on 03/11/2019.    Attending notified.    Expected Discharge Plan: [place] Barriers to Discharge: No Barriers Identified   Patient Goals and CMS Choice     Choice offered to / list presented to : Spouse  Expected Discharge Plan and Services Expected Discharge Plan: [place] Choice: [place] arrangements for the past 2 months: Single Family Home Expected Discharge Date: 03/11/19                         HH Arranged: RN, PT [place] Agency: [place] ([facility])        Prior Living Arrangements/Services Living arrangements for the past 2 months: Single Family Home Lives with:: Spouse Patient language and need for interpreter reviewed:: Yes Do you feel safe going back to the place where you live?: Yes      Need for Family Participation in Patient Care: Yes (Comment) Care giver support system in place?: Yes (comment) Current home services: DME Criminal Activity/Legal Involvement Pertinent to Current Situation/Hospitalization: No - Comment as needed  Activities of Daily Living Home Assistive Devices/Equipment: Walker (specify type) ADL Screening (condition at  time of admission) Patient's cognitive ability adequate to safely complete daily activities?: Yes Is the patient deaf or have difficulty hearing?: No Does the patient have difficulty seeing, even when wearing glasses/contacts?: No Does the patient have difficulty concentrating, remembering, or making decisions?: No Patient able to express need for assistance with ADLs?: Yes Does the patient have difficulty dressing or bathing?: Yes Independently performs ADLs?: No Communication: Independent Dressing (OT): Needs assistance Is this a change from baseline?: Pre-admission baseline Grooming: Needs assistance Is this a change from baseline?: Pre-admission baseline Feeding: Independent Is this a change from baseline?: Pre-admission baseline Bathing: Needs assistance Is this a change from baseline?: Pre-admission baseline Toileting: Needs assistance Is this a change from baseline?: Pre-admission baseline In/Out Bed: Needs assistance Is this a change from baseline?: Pre-admission baseline Walks in Home: Needs assistance Is this a change from baseline?: Pre-admission baseline Does the patient have difficulty walking or climbing stairs?: Yes Weakness of Legs: Both Weakness of Arms/Hands: None  Permission Sought/Granted Permission sought to share information with : PCP, Family Supports          Permission granted to share info w Relationship: spouse, Mrs. Mcdowell     Emotional Assessment     Affect (typically observed): Calm Orientation: : Oriented to Self, Oriented to Place, Oriented to  Time, Oriented to Situation Alcohol / Substance Use: Not Applicable Psych Involvement: No (comment)  Admission diagnosis:  Dehydration [place].0]  Nausea in adult patient [R11.0] Failure to thrive in adult [R62.7] Patient Active Problem List   Diagnosis Date Noted  . Protein-calorie malnutrition, severe 03/11/2019  . Dehydration   . Failure to thrive in adult 03/12/2019  . Nausea in adult patient  03/29/2019  . Closed fracture of left proximal humerus 11/24/18 02/05/2019  . Elevated liver function tests   . Cancer, hepatocellular ([place]) 11/24/2018  . Anorexia   . Calculus of gallbladder without cholecystitis without obstruction   . Dilated pancreatic duct   . Liver lesion   . Pancreatitis 11/11/2018  . AKI (acute kidney injury) ([address]) 11/11/2018  . Diabetes mellitus type 2, uncomplicated ([place]) 16/05/9603  . Osteoporosis 11/15/2015  . Osteopenia determined by x-ray 04/12/2015  . 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  . Purpura senilis ([place]) 12/29/2014  . Carotid artery stenosis 09/02/2014  . Cardiomyopathy, dilated, nonischemic ([place]) 04/18/2014  . Postoperative atrial fibrillation ([place]) 04/18/2014  . 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  . Aortic stenosis 04/08/2014  . Chronic systolic heart failure ([place]) 04/08/2014  . Unstable angina ([place]) 04/08/2014  . Chest pain 04/08/2014  . Personal history of colonic polyps 11/26/2013  . Loss of weight 11/02/2013  . Aortic valve disorder 07/11/2010  . LBBB 07/11/2010  . Bilateral carotid bruits 07/11/2010  . NEOPLASM OF UNCERTAIN BEHAVIOR OF SKIN 09/27/2008  . BENIGN PROSTATIC HYPERTROPHY, WITH OBSTRUCTION 09/27/2008  . KNEE PAIN 09/27/2008  . FLATULENCE ERUCTATION AND GAS PAIN 04/06/2008  . ALLERGIC RHINITIS 01/09/2008  . Constipation 10/17/2007  . HEADACHE 07/25/2007  . LEG CRAMPS 02/13/2007  . DYSPNEA ON EXERTION 01/10/2007  . Hyperlipidemia 10/10/2006  . Essential hypertension 10/10/2006  . GERD 10/10/2006  . CARDIAC MURMUR 10/10/2006  . CHEST PAIN, EXERTIONAL 10/10/2006   PCP:  Susy Frizzle, MD Pharmacy:   [id number] [place], [address] [place] [address] Phone: [phone] Fax: [place], [address] [place] [place] [address] Phone: [phone] Fax:  [phone]  [facility] [place] [place] [address] [place] [address] Phone: [phone] Fax: [place], [address] [place] [address] Phone: [phone] Fax: [phone]     Social Determinants of Health (SDOH) Interventions    Readmission Risk Interventions No flowsheet data found.

## 2019-03-12 NOTE — Consult Note (Signed)
[facility] Oncology Progress Note  Name: [name]      MRN: [id number]    Location: W098/J191-47  Date: 03/12/2019 Time:6:03 PM   Subjective: Interval History:[name] is lying in bed this morning.  Denies any abdominal pain.  Denies any cough or expectoration.  Continues to feel weak.  Yesterday had some loose bowel movements as he received laxatives.  He is still on liquid diet.  He was able to work with physical therapy yesterday.  Did not have any fever.  No nausea or vomiting reported.  Objective: Vital signs in last 24 hours: Temp:  [97.9 F (36.6 C)-98.6 F (37 C)] 97.9 F (36.6 C) (08/06 1526) Pulse Rate:  [100-105] 100 (08/06 1526) Resp:  [16-18] 16 (08/06 1526) BP: (115-126)/(62-68) 126/63 (08/06 1526) SpO2:  [91 %-98 %] 97 % (08/06 1526)    Intake/Output from previous day: 08/05 0800 - 08/06 0759 In: 1470.5 [P.O.:1080; I.V.:390.5] Out: 1175 [Urine:1175]    Intake/Output this shift: Total I/O In: 480 [P.O.:480] Out: -    PHYSICAL EXAM: BP 126/63 (BP Location: Left Arm)   Pulse 100   Temp 97.9 F (36.6 C) (Oral)   Resp 16   Ht 5\' 8"  (1.727 m)   Wt 110 lb 10.7 oz (50.2 kg)   SpO2 97%   BMI 16.83 kg/m  General appearance: alert, cooperative and appears stated age Lungs: clear to auscultation bilaterally Heart: regular rate and rhythm Abdomen: soft, non-tender; bowel sounds normal; no masses,  no organomegaly Extremities: extremities normal, atraumatic, no cyanosis or edema Skin: Skin color, texture, turgor normal. No rashes or lesions Lymph nodes: Cervical, supraclavicular, and axillary nodes normal. Neurologic: Grossly normal   Studies/Results: Results for orders placed or performed during the hospital encounter of 03/13/2019 (from the past 48 hour(s))  Basic metabolic panel     Status: Abnormal   Collection Time: 03/11/19  6:32 AM  Result Value Ref Range   Sodium 138 135 - 145 mmol/L   Potassium 3.9 3.5 - 5.1 mmol/L   Chloride 99 98  - 111 mmol/L   CO2 28 22 - 32 mmol/L   Glucose, Bld 178 (H) 70 - 99 mg/dL   BUN 24 (H) 8 - 23 mg/dL   Creatinine, Ser 0.56 (L) 0.61 - 1.24 mg/dL   Calcium 8.5 (L) 8.9 - 10.3 mg/dL   GFR calc non Af Amer >60 >60 mL/min   GFR calc Af Amer >60 >60 mL/min   Anion gap 11 5 - 15    Comment: Performed at [facility], [address], [place], [place] [address]  CBC     Status: None   Collection Time: 03/11/19  6:32 AM  Result Value Ref Range   WBC 8.7 4.0 - 10.5 K/uL   RBC 4.50 4.22 - 5.81 MIL/uL   Hemoglobin 13.2 13.0 - 17.0 g/dL   HCT 41.9 39.0 - 52.0 %   MCV 93.1 80.0 - 100.0 fL   MCH 29.3 26.0 - 34.0 pg   MCHC 31.5 30.0 - 36.0 g/dL   RDW 14.6 11.5 - 15.5 %   Platelets 151 150 - 400 K/uL   nRBC 0.0 0.0 - 0.2 %    Comment: Performed at [facility], [address], [place], [place] [address]  Basic metabolic panel     Status: Abnormal   Collection Time: 03/12/19  4:46 AM  Result Value Ref Range   Sodium 134 (L) 135 - 145 mmol/L   Potassium 2.8 (L) 3.5 -  5.1 mmol/L    Comment: DELTA CHECK NOTED   Chloride 104 98 - 111 mmol/L   CO2 25 22 - 32 mmol/L   Glucose, Bld 98 70 - 99 mg/dL   BUN 15 8 - 23 mg/dL   Creatinine, Ser 0.47 (L) 0.61 - 1.24 mg/dL   Calcium 8.0 (L) 8.9 - 10.3 mg/dL   GFR calc non Af Amer >60 >60 mL/min   GFR calc Af Amer >60 >60 mL/min   Anion gap 5 5 - 15    Comment: Performed at [facility], [address] [place], [place] [address]  CBC     Status: Abnormal   Collection Time: 03/12/19  4:46 AM  Result Value Ref Range   WBC 6.1 4.0 - 10.5 K/uL   RBC 4.12 (L) 4.22 - 5.81 MIL/uL   Hemoglobin 12.2 (L) 13.0 - 17.0 g/dL   HCT 38.0 (L) 39.0 - 52.0 %   MCV 92.2 80.0 - 100.0 fL   MCH 29.6 26.0 - 34.0 pg   MCHC 32.1 30.0 - 36.0 g/dL   RDW 14.2 11.5 - 15.5 %   Platelets 106 (L) 150 - 400 K/uL    Comment: PLATELET COUNT CONFIRMED BY SMEAR SPECIMEN CHECKED FOR CLOTS Immature Platelet Fraction may be clinically indicated, consider ordering this additional  test [id number]    nRBC 0.0 0.0 - 0.2 %    Comment: Performed at [facility], [address], [place], [place] [address]   No results found.   MEDICATIONS: I have reviewed the patient's current medications.     Assessment/Plan:  1.  Hepatocellular carcinoma: - Status post SBRT from 01/28/2019 through 02/11/2019. - He had intermittent right upper quadrant pain for the last 1 week.  Pain has improved since he was hospitalized. -CTAP on 03/25/2019 without contrast showed stable cirrhotic changes with vague area of low attenuation in the central liver. -We will obtain MRI of the liver with and without contrast as outpatient in a month. -I will see him back in the clinic upon his discharge.  2.  Constipation: -He was given laxatives yesterday and had bowel movements. -He is on liquid diet at this time which will be advanced.  3.  Generalized weakness: - He would have home health with PT upon discharge versus discharge to rehab facility.  4.  Nutrition: - Advancement of diet was recommended.  Boost breeze 3 times daily as supplement was recommended.  5.  Transaminitis: -This is trending down.  This is from SBRT.  6.  Orthostasis: -This has resolved with IV fluids and holding metoprolol.   All questions were answered. The patient knows to call the clinic with any problems, questions or concerns. We can certainly see the patient much sooner if necessary.     Derek Jack

## 2019-03-12 NOTE — Progress Notes (Signed)
Orthos were unable to be completed this am. Patient cannot stand without stool leaking from his bottom. Pt received lactulose, dulcolax, and fleet enema last night. Pt has had several loose stools throughout the night.  Interventions proved successful. Will inform oncoming nurse.

## 2019-03-12 NOTE — Progress Notes (Signed)
Pt able to shave self without assistance. Remains up in chair per request States food has no flavor and feels like he has a lump in his throat. Swallows food and liquid without difficulty. No lump in neck or throat palpated. Using urinal after requesting condom cath be removed due to irritation of penis.

## 2019-03-12 NOTE — Progress Notes (Signed)
Pt able to go from lying to sitting with only stabilizing assistance. Orthostatic vital signs completed without difficulty, pt with no dizziness or difficulty. Pt ambulated with PT standby assist and walker 262ft without SOB or difficulty. Now sitting in chair, denies complaints except that "nothing tastes right". Sipping on strawberry ensure and apple juice. Condom cath intact with amber colored urine.

## 2019-03-13 ENCOUNTER — Encounter (HOSPITAL_COMMUNITY): Payer: Medicare HMO

## 2019-03-13 ENCOUNTER — Other Ambulatory Visit (HOSPITAL_COMMUNITY): Payer: Self-pay | Admitting: Nurse Practitioner

## 2019-03-13 LAB — BASIC METABOLIC PANEL
Anion gap: 7 (ref 5–15)
BUN: 11 mg/dL (ref 8–23)
CO2: 23 mmol/L (ref 22–32)
Calcium: 8 mg/dL — ABNORMAL LOW (ref 8.9–10.3)
Chloride: 103 mmol/L (ref 98–111)
Creatinine, Ser: 0.54 mg/dL — ABNORMAL LOW (ref 0.61–1.24)
GFR calc Af Amer: >60 mL/min (ref >60)
GFR calc non Af Amer: >60 mL/min (ref >60)
Glucose, Bld: 71 mg/dL (ref 70–99)
Potassium: 3.3 mmol/L — ABNORMAL LOW (ref 3.5–5.1)
Sodium: 133 mmol/L — ABNORMAL LOW (ref 135–145)

## 2019-03-13 MED ORDER — BASAGLAR KWIKPEN 100 UNIT/ML ~~LOC~~ SOPN: 12.0000 [IU] | PEN_INJECTOR | Freq: Every day | SUBCUTANEOUS | 3 refills | Status: AC

## 2019-03-13 MED ORDER — POTASSIUM CHLORIDE CRYS ER 10 MEQ PO TBCR: 10.0000 meq | EXTENDED_RELEASE_TABLET | Freq: Two times a day (BID) | ORAL | 3 refills | Status: AC

## 2019-03-13 MED ORDER — LACTULOSE 10 GM/15ML PO SOLN
20.0000 g | Freq: Two times a day (BID) | ORAL | Status: DC
  Administered 2019-03-13: 20 g via ORAL
  Filled 2019-03-13: qty 30

## 2019-03-13 MED ORDER — POTASSIUM CHLORIDE CRYS ER 20 MEQ PO TBCR
40.0000 meq | EXTENDED_RELEASE_TABLET | ORAL | Status: AC
  Administered 2019-03-13: 40 meq via ORAL
  Filled 2019-03-13: qty 2

## 2019-03-13 MED ORDER — PANTOPRAZOLE SODIUM 40 MG PO TBEC: 40.0000 mg | DELAYED_RELEASE_TABLET | Freq: Every day | ORAL | 5 refills | Status: AC

## 2019-03-13 MED ORDER — ASPIRIN 81 MG PO TBEC: 81.0000 mg | DELAYED_RELEASE_TABLET | Freq: Every day | ORAL | 2 refills | Status: AC

## 2019-03-13 MED ORDER — DRONABINOL 5 MG PO CAPS: 5.0000 mg | ORAL_CAPSULE | Freq: Two times a day (BID) | ORAL | 2 refills | Status: AC

## 2019-03-13 MED ORDER — DRONABINOL 2.5 MG PO CAPS
5.0000 mg | ORAL_CAPSULE | Freq: Two times a day (BID) | ORAL | Status: DC
  Administered 2019-03-13 – 2019-03-22 (×12): 5 mg via ORAL
  Filled 2019-03-13: qty 2
  Filled 2019-03-13 (×5): qty 1
  Filled 2019-03-13 (×2): qty 2
  Filled 2019-03-13 (×2): qty 1
  Filled 2019-03-13: qty 2
  Filled 2019-03-13: qty 1
  Filled 2019-03-13: qty 2

## 2019-03-13 MED ORDER — SENNOSIDES-DOCUSATE SODIUM 8.6-50 MG PO TABS: 2.0000 | ORAL_TABLET | Freq: Every day | ORAL | 3 refills | Status: AC

## 2019-03-13 MED ORDER — ONDANSETRON HCL 4 MG/2ML IJ SOLN
4.0000 mg | Freq: Once | INTRAMUSCULAR | Status: AC
  Administered 2019-03-13: 4 mg via INTRAVENOUS
  Filled 2019-03-13: qty 2

## 2019-03-13 NOTE — TOC Transition Note (Signed)
Transition of Care [facility]) - CM/SW Discharge Note   Patient Details  Name: [name] MRN: [id number] Date of Birth: 1941/08/03  Transition of Care [facility]) CM/SW Contact:  Ihor Gully, LCSW Phone Number: 03/13/2019, 11:33 AM   Clinical Narrative:    [name] at [facility] advised of discharge. Hospital follow up appointment scheduled for 03/17/2019 @ 4:30   Final next level of care: Home w [address] Barriers to Discharge: No Barriers Identified   Patient Goals and CMS Choice     Choice offered to / list presented to : Spouse  Discharge Placement                       Discharge Plan and Services     Post Acute Care Choice: Home Health                    HH Arranged: RN, PT [facility] Agency: [place] ([facility])        Social Determinants of Health (SDOH) Interventions     Readmission Risk Interventions Readmission Risk Prevention Plan 03/13/2019  Transportation Screening Complete  PCP or Specialist Appt within 3-5 Days Complete  HRI or [place] Complete  Social Work Consult for [place] Planning/Counseling Complete  Palliative Care Screening Not Applicable  Medication Review [employer]) Complete  Some recent data might be hidden

## 2019-03-13 NOTE — Progress Notes (Signed)
PROGRESS NOTE    [name]  WUJ:[id number] DOB: 07-Aug-1940 DOA: 03/20/2019 PCP: Susy Frizzle, MD    Brief Narrative:  78 year old male with a history aortic stenosis, diabetes, hypertension, hyperlipidemia, recent diagnosis of hepatocellular carcinoma status post radiation.  Presented to hospital with complaints of abdominal pain, nausea and vomiting.  He was generally weak and having difficulty ambulating.  Patient was admitted for failure to thrive, orthostasis, persistent nausea and vomiting.  Overall symptoms are improving.  Seen by physical therapy with recommendation for skilled nursing facility placement. -patient and family request SNF for rehab as patient and family does not feel safe at home, PT previously recommended SNF as well   Assessment & Plan:   Active Problems:   Essential hypertension   GERD   Constipation   Cardiomyopathy, dilated, nonischemic (HCC)   Cancer, hepatocellular ([place])   Failure to thrive in adult   Nausea in adult patient   Protein-calorie malnutrition, severe   Dehydration    1)Orthostasis, secondary to dehydration and volume depletion.  Continue to hold home dose of metoprolol.  Continue gentle hydration.  Repeat orthostatics if still positive - -some dizziness with ambulation persist   2)hepatocellular carcinoma.  Follow-up with Dr. Delton Coombes.  He is completed radiation treatment.  3)Anorexia with protein caloric malnutrition --- increase Marinol to 5 mg twice daily, continue nutritional supplements  4)constipation.  CT scan indicated stool burden with possible fecal impaction.  -Improved with laxatives and enemas   5)Diabetes.  Continue on sliding scale insulin.  Blood sugar stable.  6)Generalized weakness with failure to thrive.  Secondary to decreased p.o. intake.  Seen by physical therapy with recommendation for skilled nursing facility placement.  He is very weak and having difficulty walking.   ---  patient and family request  SNF for rehab as patient and family does not feel safe at home, PT previously recommended SNF as well  7)Hypokalemia--due to GI losses continue to replace and recheck  DVT prophylaxis: Heparin Code Status: DNR Family Communication: Discussed with wife [name] over the phone and also d/w daughter [name] --  Disposition Plan:  --Remains asymptomatic, Dizziness with ambulation, patient with some dyspnea on exertion --- Awaiting insurance approval for transfer to skilled nursing facility placement-   Consultants:   Oncology consult with Dr. Delton Coombes  Procedures:     Antimicrobials:      Subjective: Passing lots of gas, some abdominal discomfort, no fevers  Objective: Vitals:   03/12/19 1526 03/12/19 2127 03/13/19 0625 03/13/19 1403  BP: 126/63 114/69 109/74 129/69  Pulse: 100 (!) 104 (!) 103 95  Resp: 16 17 18 18   Temp: 97.9 F (36.6 C) 98.3 F (36.8 C) 97.6 F (36.4 C) 98 F (36.7 C)  TempSrc: Oral Oral Oral Oral  SpO2: 97% 96% 95% 98%  Weight:   54.3 kg   Height:        Intake/Output Summary (Last 24 hours) at 03/13/2019 1513 Last data filed at 03/13/2019 0055 Gross per 24 hour  Intake 240 ml  Output 100 ml  Net 140 ml   Filed Weights   03/09/2019 0944 04/04/2019 1836 03/13/19 0625  Weight: 50.3 kg 50.2 kg 54.3 kg    Examination:  General exam: Appears calm and comfortable, cachectic   Respiratory system: Mostly clear, but does have some dyspnea on exertion  cardiovascular system: S1 & S2 heard, RRR.  Patient with dizziness with attempt to ambulate  gastrointestinal system: Abdomen is nondistended, soft and nontender.  Normal bowel sounds  heard. Central nervous system: Alert and oriented.  Patient with generalized weakness, no new focal neurological deficits. Extremities: Pedal pulses are present,  psychiatry: Judgement and insight appear normal. Mood & affect appropriate.    Data Reviewed:  CBC: Recent Labs  Lab 03/07/2019 1045 03/11/19 0632 03/12/19  0446  WBC 11.2* 8.7 6.1  NEUTROABS 10.2*  --   --   HGB 15.5 13.2 12.2*  HCT 48.1 41.9 38.0*  MCV 90.8 93.1 92.2  PLT 219 151 119*   Basic Metabolic Panel: Recent Labs  Lab 03/15/2019 1045 03/31/2019 1114 03/11/19 0632 03/12/19 0446 03/13/19 0549  NA 136  --  138 134* 133*  K 4.2  --  3.9 2.8* 3.3*  CL 94*  --  99 104 103  CO2 29  --  28 25 23   GLUCOSE 217*  --  178* 98 71  BUN 29*  --  24* 15 11  CREATININE 0.79  --  0.56* 0.47* 0.54*  CALCIUM 9.2  --  8.5* 8.0* 8.0*  MG  --  2.4  --   --   --    GFR: Estimated Creatinine Clearance: 57.5 mL/min (A) (by C-G formula based on SCr of 0.54 mg/dL (L)). Liver Function Tests: Recent Labs  Lab 03/15/2019 1045  AST 66*  ALT 94*  ALKPHOS 563*  BILITOT 1.9*  PROT 7.8  ALBUMIN 3.2*   Recent Labs  Lab 04/05/2019 1045  LIPASE 25   No results for input(s): AMMONIA in the last 168 hours. Coagulation Profile: No results for input(s): INR, PROTIME in the last 168 hours. Cardiac Enzymes: No results for input(s): CKTOTAL, CKMB, CKMBINDEX, TROPONINI in the last 168 hours. BNP (last 3 results) No results for input(s): PROBNP in the last 8760 hours. HbA1C: No results for input(s): HGBA1C in the last 72 hours. CBG: No results for input(s): GLUCAP in the last 168 hours. Lipid Profile: No results for input(s): CHOL, HDL, LDLCALC, TRIG, CHOLHDL, LDLDIRECT in the last 72 hours. Thyroid Function Tests: No results for input(s): TSH, T4TOTAL, FREET4, T3FREE, THYROIDAB in the last 72 hours. Anemia Panel: No results for input(s): VITAMINB12, FOLATE, FERRITIN, TIBC, IRON, RETICCTPCT in the last 72 hours. Sepsis Labs: No results for input(s): PROCALCITON, LATICACIDVEN in the last 168 hours.  Recent Results (from the past 240 hour(s))  Urine culture     Status: None   Collection Time: 03/20/2019  9:57 AM   Specimen: Urine, Clean Catch  Result Value Ref Range Status   Specimen Description   Final    URINE, CLEAN CATCH Performed at [facility], [address], [place], [place] [address]    Special Requests   Final    NONE Performed at [facility], [address], [place],  [address]    Culture   Final    Multiple bacterial morphotypes present, none predominant. Suggest appropriate recollection if clinically indicated.   Report Status 03/11/2019 FINAL  Final  SARS CORONAVIRUS 2 Nasal Swab Aptima Multi Swab     Status: None   Collection Time: 03/18/2019 11:12 AM   Specimen: Aptima Multi Swab; Nasal Swab  Result Value Ref Range Status   SARS Coronavirus 2 NEGATIVE NEGATIVE Final    Comment: (NOTE) SARS-CoV-2 target nucleic acids are NOT DETECTED. The SARS-CoV-2 RNA is generally detectable in upper and lower respiratory specimens during the acute phase of infection. Negative results do not preclude SARS-CoV-2 infection, do not rule out co-infections with other pathogens, and should not be used as the sole  basis for treatment or other patient management decisions. Negative results must be combined with clinical observations, patient history, and epidemiological information. The expected result is Negative. Fact Sheet for Patients: SugarRoll.be Fact Sheet for Healthcare Providers: https://www.woods-mathews.com/ This test is not yet approved or cleared by the [place] FDA and  has been authorized for detection and/or diagnosis of SARS-CoV-2 by FDA under an Emergency Use Authorization (EUA). This EUA will remain  in effect (meaning this test can be used) for the duration of the COVID-19 declaration under Section 56 4(b)(1) of the Act, 21 U.S.C. section 360bbb-3(b)(1), unless the authorization is terminated or revoked sooner. Performed at [facility], [place] [address], [place], [place] [address]      Radiology Studies: No results found.   Scheduled Meds: . aspirin EC  81 mg Oral Daily  . bisacodyl  10 mg Rectal Once  . cholecalciferol  2,000 Units Oral Daily  .  dronabinol  5 mg Oral BID AC  . feeding supplement (ENSURE ENLIVE)  237 mL Oral BID BM  . heparin  5,000 Units Subcutaneous Q8H  . insulin glargine  15 Units Subcutaneous Daily  . lactulose  20 g Oral BID  . omega-3 acid ethyl esters  1 g Oral QPM  . pantoprazole  40 mg Oral BID AC  . polyethylene glycol  34 g Oral Daily  . potassium chloride  10 mEq Oral BID  . senna-docusate  2 tablet Oral BID  . valACYclovir  1,000 mg Oral BID   Continuous Infusions: . sodium chloride 20 mL/hr at 03/13/19 1411     LOS: 2 days    Time spent: 40mins    Roxan Hockey, MD [facility]   If 7PM-7AM, please contact night-coverage [email or web address]  03/13/2019, 3:13 PM

## 2019-03-13 NOTE — Care Management Important Message (Signed)
Important Message  Patient Details  Name: [name] MRN: [id number] Date of Birth: January 26, 1941   Medicare Important Message Given:  Yes     Tommy Medal 03/13/2019, 1:10 PM

## 2019-03-13 NOTE — Progress Notes (Signed)
Physical Therapy Treatment Patient Details Name: [name] MRN: [id number] DOB: Nov 01, 1940 Today's Date: 03/13/2019    History of Present Illness [name]  is a 78 y.o. 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 Delton Coombes , she presents today to multiple complaints, including abdominal pain, nausea, vomiting, generalized weakness, patient was at oncology office yesterday, he received 1 L of IV fluids at infusion center, wife patient remains significantly weak, with nausea, and vomiting, with no oral intake, as well complaining of abdominal pain, mainly post radiation, no improvement with Zofran which prompted her to bring him to ED, patient denies fever, chills, chest pain, shortness of breath, diarrhea .    PT Comments    Patient able to ambulate short distances safely without use of AD, once fatigued patient starts to scissor legs most due to leg length discrepancy (left shorter than right) per patient, then has to lean on near by objects for support due to near loss of balance requiring use of RW for rest of gait training.  Patient able to transfer to commode in bathroom using grab bar and later tolerated sitting up in chair after therapy.  Patient advised to use RW for longer distances when he returns home.  Patient will benefit from continued physical therapy in hospital and recommended venue below to increase strength, balance, endurance for safe ADLs and gait.   Follow Up Recommendations  Home health PT;Supervision - Intermittent;Supervision for mobility/OOB     Equipment Recommendations  None recommended by PT    Recommendations for Other Services       Precautions / Restrictions Precautions Precautions: Fall Restrictions Weight Bearing  Restrictions: No    Mobility  Bed Mobility Overal bed mobility: Modified Independent             General bed mobility comments: increased time, use of bed rail  Transfers Overall transfer level: Needs assistance Equipment used: None;Rolling walker (2 wheeled)   Sit to Stand: Supervision Stand pivot transfers: Supervision       General transfer comment: slightly labored movement without AD, safer using RW  Ambulation/Gait Ambulation/Gait assistance: Min guard;Supervision Gait Distance (Feet): 200 Feet Assistive device: None;Rolling walker (2 wheeled) Gait Pattern/deviations: Decreased step length - right;Decreased step length - left;Decreased stride length;Scissoring Gait velocity: decreased   General Gait Details: slightly labored slow cadence without AD, occasional scissoring of legs having to lean on nearby objects for support, safer using RW, no loss of balance   Stairs             Wheelchair Mobility    Modified Rankin (Stroke Patients Only)       Balance Overall balance assessment: Needs assistance Sitting-balance support: Feet supported;No upper extremity supported Sitting balance-Leahy Scale: Good     Standing balance support: During functional activity;No upper extremity supported Standing balance-Leahy Scale: Fair Standing balance comment: fair/good using RW                            Cognition Arousal/Alertness: Awake/alert Behavior During Therapy: WFL for tasks assessed/performed Overall Cognitive Status: Within Functional Limits for tasks assessed  Exercises General Exercises - Lower Extremity Long Arc Quad: Seated;AROM;Strengthening;Both;10 reps Hip Flexion/Marching: Seated;AROM;Strengthening;Both;10 reps Toe Raises: Seated;AROM;Strengthening;Both;10 reps Heel Raises: Seated;AROM;Strengthening;Both;10 reps    General Comments        Pertinent Vitals/Pain Pain  Assessment: No/denies pain    Home Living                      Prior Function            PT Goals (current goals can now be found in the care plan section) Acute Rehab PT Goals Patient Stated Goal: return  home with spouse to assist PT Goal Formulation: With patient Time For Goal Achievement: 03/25/19 Potential to Achieve Goals: Good Progress towards PT goals: Progressing toward goals    Frequency    Min 3X/week      PT Plan Current plan remains appropriate    Co-evaluation              AM-PAC PT "6 Clicks" Mobility   Outcome Measure  Help needed turning from your back to your side while in a flat bed without using bedrails?: None Help needed moving from lying on your back to sitting on the side of a flat bed without using bedrails?: None Help needed moving to and from a bed to a chair (including a wheelchair)?: A Little Help needed standing up from a chair using your arms (e.g., wheelchair or bedside chair)?: A Little Help needed to walk in hospital room?: A Little Help needed climbing 3-5 steps with a railing? : A Little 6 Click Score: 20    End of Session Equipment Utilized During Treatment: Gait belt Activity Tolerance: Patient tolerated treatment well;Patient limited by fatigue Patient left: in chair Nurse Communication: Mobility status PT Visit Diagnosis: Unsteadiness on feet (R26.81);Other abnormalities of gait and mobility (R26.89);Muscle weakness (generalized) (M62.81)     Time: 9935-7017 PT Time Calculation (min) (ACUTE ONLY): 25 min  Charges:  $Gait Training: 8-22 mins $Therapeutic Exercise: 8-22 mins                     1:58 PM, 03/13/19 Lonell Grandchild, MPT Physical Therapist with [facility] [phone] office [phone] mobile phone

## 2019-03-13 NOTE — TOC Progression Note (Signed)
Transition of Care [facility]) - Progression Note    Patient Details  Name: [name] MRN: [id number] Date of Birth: Jul 10, 1941  Transition of Care [facility]) CM/SW Contact  Ihor Gully, LCSW Phone Number: 03/13/2019, 3:37 PM  Clinical Narrative:    Multiple conversations were held with patient and spouse regarding discharge plan by LCSW and attending.  Patient and spouse were desired [place] services. LCSW received a call from patient's daughter, [name], advising that she had spoken with her mother (patient's wife) and felt patient needed SNF because he was still weak and not eating. [name] desired to speak with attending. After speaking with attending it was determined that patient would go to SNF for short term rehab.   Mrs. Arko was provided SNF choices and ratings were discussed. Referral sent to choices. Advised that [facility] would have to authorize SNF placement.    Expected Discharge Plan: [place] Barriers to Discharge: No Barriers Identified  Expected Discharge Plan and Services Expected Discharge Plan: [place] arrangements for the past 2 months: Single Family Home Expected Discharge Date: 03/13/19                         HH Arranged: RN, PT [place] Agency: [place] ([facility])         Social Determinants of Health (SDOH) Interventions    Readmission Risk Interventions Readmission Risk Prevention Plan 03/13/2019  Transportation Screening Complete  PCP or Specialist Appt within 3-5 Days Complete  HRI or [place] Complete  Social Work Consult for [place] Planning/Counseling Complete  Palliative Care Screening Not Applicable  Medication Review [employer]) Complete  Some recent data might be hidden

## 2019-03-13 NOTE — Plan of Care (Signed)

## 2019-03-13 NOTE — Progress Notes (Signed)
Patient's daughter [name] called to discuss pending discharge of her father today. She states that he still is not eating enough and his stomach is still bloated and she does not feel he is stable enough for discharge. Dr. Rosalva Ferron notified of daughter's concern and of her phone number, as she would like to speak directly with the physician. MD states he will call her. Pt up to bathroom, passing large amount of flatus and small amount flakey stool. Pt states his abd still feels bloated.

## 2019-03-14 ENCOUNTER — Other Ambulatory Visit: Payer: Self-pay

## 2019-03-14 DIAGNOSIS — R42 Dizziness and giddiness: Secondary | ICD-10-CM

## 2019-03-14 LAB — BASIC METABOLIC PANEL
Anion gap: 8 (ref 5–15)
BUN: 15 mg/dL (ref 8–23)
CO2: 22 mmol/L (ref 22–32)
Calcium: 7.9 mg/dL — ABNORMAL LOW (ref 8.9–10.3)
Chloride: 105 mmol/L (ref 98–111)
Creatinine, Ser: 0.8 mg/dL (ref 0.61–1.24)
GFR calc Af Amer: >60 mL/min (ref >60)
GFR calc non Af Amer: >60 mL/min (ref >60)
Glucose, Bld: 65 mg/dL — ABNORMAL LOW (ref 70–99)
Potassium: 3.6 mmol/L (ref 3.5–5.1)
Sodium: 135 mmol/L (ref 135–145)

## 2019-03-14 LAB — TROPONIN I (HIGH SENSITIVITY): Troponin I (High Sensitivity): 215 ng/L (ref <18)

## 2019-03-14 LAB — MAGNESIUM: Magnesium: 1.8 mg/dL (ref 1.7–2.4)

## 2019-03-14 LAB — MRSA PCR SCREENING: MRSA by PCR: NEGATIVE

## 2019-03-14 MED ORDER — DILTIAZEM LOAD VIA INFUSION: 10.0000 mg | Freq: Once | INTRAVENOUS | Status: DC

## 2019-03-14 MED ORDER — ADENOSINE 6 MG/2ML IV SOLN
6.0000 mg | Freq: Once | INTRAVENOUS | Status: AC
  Administered 2019-03-14: 16:00:00 6 mg via INTRAVENOUS

## 2019-03-14 MED ORDER — SENNOSIDES-DOCUSATE SODIUM 8.6-50 MG PO TABS
2.0000 | ORAL_TABLET | Freq: Every day | ORAL | Status: DC
  Administered 2019-03-14: 2 via ORAL
  Filled 2019-03-14: qty 2

## 2019-03-14 MED ORDER — KCL IN DEXTROSE-NACL 20-5-0.45 MEQ/L-%-% IV SOLN
INTRAVENOUS | Status: DC
  Administered 2019-03-14 – 2019-03-22 (×10): via INTRAVENOUS

## 2019-03-14 MED ORDER — MAGNESIUM SULFATE 2 GM/50ML IV SOLN
2.0000 g | Freq: Once | INTRAVENOUS | Status: AC
  Administered 2019-03-14: 2 g via INTRAVENOUS
  Filled 2019-03-14: qty 50

## 2019-03-14 MED ORDER — LACTULOSE 10 GM/15ML PO SOLN
20.0000 g | Freq: Every day | ORAL | Status: DC
  Administered 2019-03-18 – 2019-03-19 (×2): 20 g via ORAL
  Filled 2019-03-14 (×4): qty 30

## 2019-03-14 MED ORDER — POTASSIUM CHLORIDE CRYS ER 20 MEQ PO TBCR
40.0000 meq | EXTENDED_RELEASE_TABLET | ORAL | Status: AC
  Administered 2019-03-14: 40 meq via ORAL
  Filled 2019-03-14 (×2): qty 2

## 2019-03-14 MED ORDER — DIGOXIN 0.25 MG/ML IJ SOLN
0.5000 mg | Freq: Once | INTRAMUSCULAR | Status: AC
  Administered 2019-03-14: 0.5 mg via INTRAVENOUS
  Filled 2019-03-14: qty 2

## 2019-03-14 MED ORDER — SODIUM CHLORIDE 0.9 % IV BOLUS
500.0000 mL | Freq: Once | INTRAVENOUS | Status: AC
  Administered 2019-03-14: 500 mL via INTRAVENOUS

## 2019-03-14 MED ORDER — DILTIAZEM HCL 100 MG IV SOLR
INTRAVENOUS | Status: AC
  Filled 2019-03-14: qty 100

## 2019-03-14 MED ORDER — ONDANSETRON HCL 4 MG/2ML IJ SOLN
4.0000 mg | Freq: Four times a day (QID) | INTRAMUSCULAR | Status: DC | PRN
  Administered 2019-03-15 – 2019-03-23 (×7): 4 mg via INTRAVENOUS
  Filled 2019-03-14 (×9): qty 2

## 2019-03-14 MED ORDER — DIGOXIN 0.25 MG/ML IJ SOLN
0.2500 mg | Freq: Once | INTRAMUSCULAR | Status: AC
  Administered 2019-03-14: 0.25 mg via INTRAVENOUS
  Filled 2019-03-14: qty 2

## 2019-03-14 MED ORDER — DILTIAZEM HCL 25 MG/5ML IV SOLN
10.0000 mg | Freq: Once | INTRAVENOUS | Status: AC
  Administered 2019-03-14: 10 mg via INTRAVENOUS
  Filled 2019-03-14: qty 5

## 2019-03-14 MED ORDER — ADENOSINE 6 MG/2ML IV SOLN
INTRAVENOUS | Status: AC
  Administered 2019-03-14: 6 mg
  Filled 2019-03-14: qty 4

## 2019-03-14 MED ORDER — GUAIFENESIN-DM 100-10 MG/5ML PO SYRP
5.0000 mL | ORAL_SOLUTION | ORAL | Status: DC | PRN
  Administered 2019-03-15 – 2019-03-18 (×5): 5 mL via ORAL
  Filled 2019-03-14 (×6): qty 5

## 2019-03-14 MED ORDER — HYDROCORTISONE NA SUCCINATE PF 100 MG IJ SOLR
50.0000 mg | Freq: Three times a day (TID) | INTRAMUSCULAR | Status: AC
  Administered 2019-03-14 – 2019-03-16 (×5): 50 mg via INTRAVENOUS
  Filled 2019-03-14 (×5): qty 2

## 2019-03-14 MED ORDER — DEXTROSE 50 % IV SOLN: 50.0000 mL | Freq: Once | INTRAVENOUS | Status: AC

## 2019-03-14 MED ORDER — HYDROCORTISONE NA SUCCINATE PF 100 MG IJ SOLR: 50.0000 mg | Freq: Three times a day (TID) | INTRAMUSCULAR | Status: DC

## 2019-03-14 MED ORDER — PHENYLEPHRINE HCL-NACL 10-0.9 MG/250ML-% IV SOLN
0.0000 ug/min | INTRAVENOUS | Status: DC
  Administered 2019-03-14 – 2019-03-15 (×4): 20 ug/min via INTRAVENOUS
  Administered 2019-03-16: 30 ug/min via INTRAVENOUS
  Filled 2019-03-14 (×7): qty 250

## 2019-03-14 MED ORDER — ONDANSETRON HCL 4 MG/2ML IJ SOLN
INTRAMUSCULAR | Status: AC
  Administered 2019-03-14: 13:00:00
  Filled 2019-03-14: qty 2

## 2019-03-14 MED ORDER — HYDROCORTISONE NA SUCCINATE PF 100 MG IJ SOLR
100.0000 mg | Freq: Once | INTRAMUSCULAR | Status: AC
  Administered 2019-03-14: 100 mg via INTRAVENOUS
  Filled 2019-03-14: qty 2

## 2019-03-14 MED ORDER — DEXTROSE 50 % IV SOLN
INTRAVENOUS | Status: AC
  Administered 2019-03-14: 18:00:00
  Filled 2019-03-14: qty 50

## 2019-03-14 NOTE — Progress Notes (Signed)
Patient HR 150/160 sustained MD notified, orders received.

## 2019-03-14 NOTE — Progress Notes (Signed)
CRITICAL CARE Performed by: Roxan Hockey   Total critical care time: 53minutes  Critical care time was exclusive of separately billable procedures and treating other patients.  Persistent tachycardia with heart rate in the 150s to 160s initially and hypotension systolic blood pressure 11E to 80s initially  --Initial EKG 1334 PM suggested possible SVT, patient did not respond to vagal maneuvers (Valsalva and rectal massage), did not respond to 10 mg of IV Cardizem push --We will transfer to ICU/stepdown--received IV adenosine 6 mg Rapid push x1 and IV adenosine 12 mg Rapid  Push --Heart rate temporarily slowed down but became tachycardic shortly after --Hypotension persisted --Hypoxia noted --Case discussed with on-call cardiologist Dr. Tenna Child was able to pull up with serial EKGs and review them-- --at this time patient appears to be in sinus rhythm- -Dr. Marlou Porch advised digoxin for rate control given hypotension will avoid metoprolol and Cardizem --- Will initiate Neo-Synephrine drip for pressure support --Patient's wife updated on change of condition --Patient is a DNR with full scope of treatment  Critical care was necessary to treat or prevent imminent or life-threatening deterioration.  Critical care was time spent personally by me on the following activities: development of treatment plan with patient and/or surrogate as well as nursing, discussions with consultants, evaluation of patient's response to treatment, examination of patient, obtaining history from patient or surrogate, ordering and performing treatments and interventions, ordering and review of laboratory studies, ordering and review of radiographic studies, pulse oximetry and re-evaluation of patient's condition.

## 2019-03-14 NOTE — Progress Notes (Signed)
PROGRESS NOTE    [name]  PYP:[id number] DOB: 01/23/1941 DOA: 03/26/2019 PCP: Jose Frizzle, MD    Brief Narrative:  78 year old male with a history aortic stenosis, diabetes, hypertension, hyperlipidemia, recent diagnosis of hepatocellular carcinoma status post radiation.  Presented to hospital with complaints of abdominal pain, nausea and vomiting.  He was generally weak and having difficulty ambulating.  Patient was admitted for failure to thrive, orthostasis, persistent nausea and vomiting.  Overall symptoms are improving.  Seen by physical therapy with recommendation for skilled nursing facility placement. -patient and family request SNF for rehab as patient and family does not feel safe at home, Physical Therapy  previously recommended SNF as well - Awaiting insurance approval for transfer to skilled nursing facility placement-  Assessment & Plan:   Active Problems:   Essential hypertension   GERD   Constipation   Cardiomyopathy, dilated, nonischemic (HCC)   Cancer, hepatocellular (HCC)   Failure to thrive in adult   Nausea in adult patient   Protein-calorie malnutrition, severe   Dehydration   1)Orthostastic Hypotension---  secondary to dehydration and volume depletion.  Continue to hold home dose of metoprolol.  Continue gentle hydration.  Orthostatic dizziness has not resolved completely but is improved -some dizziness with ambulation persist  2)Hepatocellular Carcinoma-- Follow-up with Dr. Delton Travis.  He has completed radiation treatment.  3)Anorexia with Protein Caloric Malnutrition --- c/n Marinol  5 mg twice daily, continue nutritional supplements  4)Constipation-  CT scan on admission indicated stool burden with possible fecal impaction.  -Improved with laxatives and enemas   5)Diabetes---Continue on sliding scale insulin.  Blood sugar stable.  6)Generalized weakness with failure to thrive.  Secondary to decreased p.o. intake.  Seen by physical therapy  with recommendation for skilled nursing facility placement.  He is very weak and having difficulty walking.   ---  patient and family request SNF for rehab as patient and family does not feel safe at home, Physical Therapist previously recommended SNF as well  7)Hypokalemia--due to GI losses continue to replace and recheck  DVT prophylaxis: Heparin Code Status: DNR Family Communication: Discussed with wife [name] over the phone and also d/w daughter [name] --  Disposition Plan:  -- --- Awaiting insurance approval for transfer to skilled nursing facility placement-   Consultants:   Oncology consult with Dr. Delton Travis  Procedures:    Antimicrobials:      Subjective: Had multiple BMs....  Has Nausea, no emesis  Objective: Vitals:   03/13/19 1403 03/13/19 2021 03/14/19 0544 03/14/19 0658  BP: 129/69 128/66 119/76   Pulse: 95 (!) 103 (!) 102   Resp: 18 16 16    Temp: 98 F (36.7 C) 98.4 F (36.9 C) 97.6 F (36.4 C)   TempSrc: Oral Oral Oral   SpO2: 98% 98% 100%   Weight:    53.6 kg  Height:        Intake/Output Summary (Last 24 hours) at 03/14/2019 1117 Last data filed at 03/14/2019 0500 Gross per 24 hour  Intake 120 ml  Output 700 ml  Net -580 ml   Filed Weights   03/19/2019 1836 03/13/19 0625 03/14/19 0658  Weight: 50.2 kg 54.3 kg 53.6 kg    Examination:  General exam: Appears calm and comfortable, cachectic   Respiratory system: Mostly clear, but does have some dyspnea on exertion  cardiovascular system: S1 & S2 heard, RRR.  Patient with dizziness with attempt to ambulate  -Sternotomy scar (prior Aortic Valve Repair) gastrointestinal system: Abdomen is nondistended, soft  and nontender.  Normal bowel sounds heard. Central nervous system: Alert and oriented.  Patient with generalized weakness, no new focal neurological deficits. Extremities: Pedal pulses are present,  psychiatry: Judgement and insight appear normal. Mood & affect appropriate.    Data Reviewed:   CBC: Recent Labs  Lab 03/27/2019 1045 03/11/19 0632 03/12/19 0446  WBC 11.2* 8.7 6.1  NEUTROABS 10.2*  --   --   HGB 15.5 13.2 12.2*  HCT 48.1 41.9 38.0*  MCV 90.8 93.1 92.2  PLT 219 151 616*   Basic Metabolic Panel: Recent Labs  Lab 03/15/2019 1045 03/31/2019 1114 03/11/19 0632 03/12/19 0446 03/13/19 0549  NA 136  --  138 134* 133*  K 4.2  --  3.9 2.8* 3.3*  CL 94*  --  99 104 103  CO2 29  --  28 25 23   GLUCOSE 217*  --  178* 98 71  BUN 29*  --  24* 15 11  CREATININE 0.79  --  0.56* 0.47* 0.54*  CALCIUM 9.2  --  8.5* 8.0* 8.0*  MG  --  2.4  --   --   --    GFR: Estimated Creatinine Clearance: 56.8 mL/min (A) (by C-G formula based on SCr of 0.54 mg/dL (L)). Liver Function Tests: Recent Labs  Lab 03/17/2019 1045  AST 66*  ALT 94*  ALKPHOS 563*  BILITOT 1.9*  PROT 7.8  ALBUMIN 3.2*   Recent Labs  Lab 03/21/2019 1045  LIPASE 25   No results for input(s): AMMONIA in the last 168 hours. Coagulation Profile: No results for input(s): INR, PROTIME in the last 168 hours. Cardiac Enzymes: No results for input(s): CKTOTAL, CKMB, CKMBINDEX, TROPONINI in the last 168 hours. BNP (last 3 results) No results for input(s): PROBNP in the last 8760 hours. HbA1C: No results for input(s): HGBA1C in the last 72 hours. CBG: No results for input(s): GLUCAP in the last 168 hours. Lipid Profile: No results for input(s): CHOL, HDL, LDLCALC, TRIG, CHOLHDL, LDLDIRECT in the last 72 hours. Thyroid Function Tests: No results for input(s): TSH, T4TOTAL, FREET4, T3FREE, THYROIDAB in the last 72 hours. Anemia Panel: No results for input(s): VITAMINB12, FOLATE, FERRITIN, TIBC, IRON, RETICCTPCT in the last 72 hours. Sepsis Labs: No results for input(s): PROCALCITON, LATICACIDVEN in the last 168 hours.  Recent Results (from the past 240 hour(s))  Urine culture     Status: None   Collection Time: 03/29/2019  9:57 AM   Specimen: Urine, Clean Catch  Result Value Ref Range Status   Specimen  Description   Final    URINE, CLEAN CATCH Performed at [facility], [address], [place], [place] [address]    Special Requests   Final    NONE Performed at [facility], [address] [place], [place] [address]    Culture   Final    Multiple bacterial morphotypes present, none predominant. Suggest appropriate recollection if clinically indicated.   Report Status 03/11/2019 FINAL  Final  SARS CORONAVIRUS 2 Nasal Swab Aptima Multi Swab     Status: None   Collection Time: 03/18/2019 11:12 AM   Specimen: Aptima Multi Swab; Nasal Swab  Result Value Ref Range Status   SARS Coronavirus 2 NEGATIVE NEGATIVE Final    Comment: (NOTE) SARS-CoV-2 target nucleic acids are NOT DETECTED. The SARS-CoV-2 RNA is generally detectable in upper and lower respiratory specimens during the acute phase of infection. Negative results do not preclude SARS-CoV-2 infection, do not rule out co-infections with other pathogens, and should  not be used as the sole basis for treatment or other patient management decisions. Negative results must be combined with clinical observations, patient history, and epidemiological information. The expected result is Negative. Fact Sheet for Patients: SugarRoll.be Fact Sheet for Healthcare Providers: https://www.woods-mathews.com/ This test is not yet approved or cleared by the [place] FDA and  has been authorized for detection and/or diagnosis of SARS-CoV-2 by FDA under an Emergency Use Authorization (EUA). This EUA will remain  in effect (meaning this test can be used) for the duration of the COVID-19 declaration under Section 56 4(b)(1) of the Act, 21 U.S.C. section 360bbb-3(b)(1), unless the authorization is terminated or revoked sooner. Performed at [facility], [place] [address], [place], [place] [address]      Radiology Studies: No results found.   Scheduled Meds: . aspirin EC  81 mg Oral Daily  .  bisacodyl  10 mg Rectal Once  . cholecalciferol  2,000 Units Oral Daily  . dronabinol  5 mg Oral BID AC  . feeding supplement (ENSURE ENLIVE)  237 mL Oral BID BM  . heparin  5,000 Units Subcutaneous Q8H  . insulin glargine  15 Units Subcutaneous Daily  . lactulose  20 g Oral Daily  . omega-3 acid ethyl esters  1 g Oral QPM  . pantoprazole  40 mg Oral BID AC  . polyethylene glycol  34 g Oral Daily  . potassium chloride  10 mEq Oral BID  . potassium chloride  40 mEq Oral Q3H  . senna-docusate  2 tablet Oral QHS  . valACYclovir  1,000 mg Oral BID   Continuous Infusions: . sodium chloride 20 mL/hr at 03/13/19 1411     LOS: 3 days      Roxan Hockey, MD [facility]   If 7PM-7AM, please contact night-coverage [email or web address]  03/14/2019, 11:17 AM

## 2019-03-15 ENCOUNTER — Inpatient Hospital Stay (HOSPITAL_COMMUNITY): Payer: Medicare HMO

## 2019-03-15 ENCOUNTER — Inpatient Hospital Stay: Payer: Self-pay

## 2019-03-15 DIAGNOSIS — I959 Hypotension, unspecified: Secondary | ICD-10-CM | POA: Diagnosis present

## 2019-03-15 DIAGNOSIS — J189 Pneumonia, unspecified organism: Secondary | ICD-10-CM | POA: Diagnosis not present

## 2019-03-15 DIAGNOSIS — J9600 Acute respiratory failure, unspecified whether with hypoxia or hypercapnia: Secondary | ICD-10-CM | POA: Diagnosis not present

## 2019-03-15 DIAGNOSIS — Z515 Encounter for palliative care: Secondary | ICD-10-CM

## 2019-03-15 DIAGNOSIS — I471 Supraventricular tachycardia: Secondary | ICD-10-CM | POA: Diagnosis not present

## 2019-03-15 LAB — BASIC METABOLIC PANEL
Anion gap: 8 (ref 5–15)
BUN: 17 mg/dL (ref 8–23)
CO2: 22 mmol/L (ref 22–32)
Calcium: 8.1 mg/dL — ABNORMAL LOW (ref 8.9–10.3)
Chloride: 104 mmol/L (ref 98–111)
Creatinine, Ser: 0.64 mg/dL (ref 0.61–1.24)
GFR calc Af Amer: >60 mL/min (ref >60)
GFR calc non Af Amer: >60 mL/min (ref >60)
Glucose, Bld: 231 mg/dL — ABNORMAL HIGH (ref 70–99)
Potassium: 4.9 mmol/L (ref 3.5–5.1)
Sodium: 134 mmol/L — ABNORMAL LOW (ref 135–145)

## 2019-03-15 LAB — CBC
HCT: 36.7 % — ABNORMAL LOW (ref 39.0–52.0)
Hemoglobin: 11.4 g/dL — ABNORMAL LOW (ref 13.0–17.0)
MCH: 29.5 pg (ref 26.0–34.0)
MCHC: 31.1 g/dL (ref 30.0–36.0)
MCV: 95.1 fL (ref 80.0–100.0)
Platelets: 96 10*3/uL — ABNORMAL LOW (ref 150–400)
RBC: 3.86 MIL/uL — ABNORMAL LOW (ref 4.22–5.81)
RDW: 15.2 % (ref 11.5–15.5)
WBC: 8.1 10*3/uL (ref 4.0–10.5)
nRBC: 0 % (ref 0.0–0.2)

## 2019-03-15 LAB — EXPECTORATED SPUTUM ASSESSMENT W GRAM STAIN, RFLX TO RESP C: Special Requests: NORMAL

## 2019-03-15 LAB — HEMOGLOBIN AND HEMATOCRIT, BLOOD
HCT: 31.1 % — ABNORMAL LOW (ref 39.0–52.0)
Hemoglobin: 10 g/dL — ABNORMAL LOW (ref 13.0–17.0)

## 2019-03-15 LAB — OCCULT BLOOD X 1 CARD TO LAB, STOOL: Fecal Occult Bld: POSITIVE — AB

## 2019-03-15 MED ORDER — DIGOXIN 125 MCG PO TABS
0.1250 mg | ORAL_TABLET | Freq: Every day | ORAL | Status: DC
  Administered 2019-03-16 – 2019-03-21 (×5): 0.125 mg via ORAL
  Filled 2019-03-15 (×5): qty 1

## 2019-03-15 MED ORDER — PIPERACILLIN-TAZOBACTAM 3.375 G IVPB
3.3750 g | Freq: Three times a day (TID) | INTRAVENOUS | Status: AC
  Administered 2019-03-15 – 2019-03-21 (×19): 3.375 g via INTRAVENOUS
  Filled 2019-03-15 (×18): qty 50

## 2019-03-15 MED ORDER — VANCOMYCIN HCL 500 MG IV SOLR
500.0000 mg | Freq: Two times a day (BID) | INTRAVENOUS | Status: DC
  Administered 2019-03-15: 500 mg via INTRAVENOUS
  Filled 2019-03-15 (×4): qty 500

## 2019-03-15 MED ORDER — VANCOMYCIN HCL IN DEXTROSE 1-5 GM/200ML-% IV SOLN
1000.0000 mg | Freq: Once | INTRAVENOUS | Status: AC
  Administered 2019-03-15: 1000 mg via INTRAVENOUS
  Filled 2019-03-15: qty 200

## 2019-03-15 MED ORDER — DIGOXIN 0.25 MG/ML IJ SOLN
0.2500 mg | Freq: Once | INTRAMUSCULAR | Status: AC
  Administered 2019-03-15: 0.25 mg via INTRAVENOUS
  Filled 2019-03-15: qty 2

## 2019-03-15 MED ORDER — CHLORHEXIDINE GLUCONATE CLOTH 2 % EX PADS
6.0000 | MEDICATED_PAD | Freq: Every day | CUTANEOUS | Status: DC
  Administered 2019-03-15 – 2019-03-16 (×2): 6 via TOPICAL

## 2019-03-15 MED ORDER — ORAL CARE MOUTH RINSE
15.0000 mL | Freq: Two times a day (BID) | OROMUCOSAL | Status: DC
  Administered 2019-03-15 – 2019-03-23 (×16): 15 mL via OROMUCOSAL

## 2019-03-15 MED ORDER — PHENYLEPHRINE HCL-NACL 10-0.9 MG/250ML-% IV SOLN: 0.0000 ug/min | INTRAVENOUS | Status: DC

## 2019-03-15 NOTE — Progress Notes (Signed)
HR too high, will attempt echo closer to lunch.

## 2019-03-15 NOTE — Progress Notes (Signed)
Patient Demographics:    [name], is a 78 y.o. male, DOB - Sep 17, 1940, OYD:[id number]  Admit date - 04/03/2019   Admitting Physician Albertine Patricia, MD  Outpatient Primary MD for the patient is [name], MD  LOS - 4   Chief Complaint  Patient presents with   Abdominal Pain        Subjective:    [name] today has no fevers, no emesis,  No chest pain,   --Shortness of breath, hypoxia and tachycardia persist --- Condition is critical, prognosis is poor   Assessment  & Plan :    Principal Problem:   Pneumonia- HCAP Vs Aspiration Related with Hypoxia and Hypotension Active Problems:   Acute Hypoxic respiratory failure  due to PNA   PSVT (paroxysmal supraventricular tachycardia) (HCC)   Hypotension   Essential hypertension   Constipation   Failure to thrive in adult   Protein-calorie malnutrition, severe   Orthostatic dizziness   GERD   Cardiomyopathy, dilated, nonischemic (HCC)   Cancer, hepatocellular (HCC)   Nausea in adult patient   Dehydration   Goals of care, counseling/discussion  Brief Summary 78 year old male with a history aortic stenosis (s/p Repair, diabetes, hypertension, hyperlipidemia, recent diagnosis of hepatocellular carcinoma status post radiation.  Presented to hospital with complaints of abdominal pain, nausea and vomiting.  He was generally weak and having difficulty ambulating.  Patient was admitted for failure to thrive, orthostasis, persistent nausea and vomiting.  --On 03/14/2019 patient developed persistent tachycardia with heart rate in the 150s to 160s initially and hypotension systolic blood pressure 67M to 80s initially, also became very hypoxic   A/p 1)Bil PNA-- HCAP Vs Aspiration related--hypoxia and tachypnea persist, requiring oxygen supplementation- ---pulmonary consult from Dr. Luan Pulling appreciated --Treat empirically with Vanco and  Zosyn -EKG from 03/15/2019 noted --Given sudden nature of respiratory distress and hypoxia and lack of  fevers or leukocytosis favor r possible aspiration related event rather than frank HCAP -Get speech eval  2)Tachyarrhythmia with hypotension-- --Initial EKG suggested possible SVT, patient did not respond to vagal maneuvers (Valsalva and rectal massage), did not respond to 10 mg of IV Cardizem push, patient did not respond to IV adenosine either -----Case discussed with on-call cardiologist Dr. Tenna Child was able to pull up with serial EKGs and review them-- --PSVT appears to have resolved, patient currently in sinus tachycardia -Unable to do echo due to persistent tachycardia ---Pressures too soft to allow for Cardizem or metoprolol for rate control, continue digoxin for rate control  3)Persistent hypotension--currently requiring Neo-Synephrine for pressure support.  Hypotension is multifactorial --On admission patient did have orthostatic hypotension and dizziness in the setting of dehydration --Suspect persistent tachycardia and pneumonia are contributing to his hypotension --Continue IV fluids, EF previously between 60 and 65% repeat echo when heart rate allows --Given orthostatic hypotension on admission and persistent hypotension Solu-Cortef initiated empirically for possible angina related hypotension --Blood pressure did not respond much to Solu-Cortef  4)Social/Ethics---phone conference and extensive conversations with patient's wife [name]), ,daughter [name]) and son --updated them on change of condition, questions answered --Patient is a DNR with full scope of treatment -Official palliative care consult requested  5)Hepatocellular Carcinoma--previously completed radiation therapy, patient follows with Dr. Dorothea Ogle with Protein Caloric  Malnutrition/FTT--malignancy related cachexia --continue Marinol for appetite stimulation and nutritional  supplements  7)DM2--A1c was 8.6 in January 2020, however patient has lost weight and oral intake has worsened since then anticipate A1c is probably lower now, patient actually developed hypoglycemia, continue dextrose infusion until oral intake improves  8) constipation--CT scan on admission indicated stool burden with possible fecal impaction.  -Improved with laxatives and enemas   9))Generalized weakness with failure to thrive.  Secondary to decreased p.o. intake.  Seen by physical therapy with recommendation for skilled nursing facility placement.  He is very weak and having difficulty walking.   ---   Physical Therapist previously recommended SNF -[Not medically ready for discharge at this time)  CRITICAL CARE Performed by: Roxan Hockey   Total critical care time: 37minutes  Critical care time was exclusive of separately billable procedures and treating other patients.  Critical care was necessary to treat or prevent imminent or life-threatening deterioration.  ---- Condition is critical, prognosis is poor  - persistent hypotension requiring IV pressors -Persistent tachycardia and hemodynamic instability  -persistent hypoxia requiring oxygen supplementation,   Critical care was time spent personally by me on the following activities: development of treatment plan with patient and/or surrogate as well as nursing, discussions with consultants, evaluation of patient's response to treatment, examination of patient, obtaining history from patient or surrogate, ordering and performing treatments and interventions, ordering and review of laboratory studies, ordering and review of radiographic studies, pulse oximetry and re-evaluation of patient's condition.   Disposition/Need for in-Hospital Stay- patient unable to be discharged at this time due to persistent hypoxia requiring oxygen supplementation, persistent hypotension requiring IV pressors -Persistent tachycardia and hemodynamic  instability   Code Status : DNR/DNI, but full scope of treatment  Family Communication:   (patient is alert, awake and coherent) --phone conference and extensive conversations with patient's wife [name]), ,daughter [name]) and son  Disposition Plan  : TBD  Consults  : Phone consult with cardiology/pulmonary consult  DVT Prophylaxis  :   - Heparin - SCDs   Lab Results  Component Value Date   PLT 96 (L) 03/15/2019    Inpatient Medications  Scheduled Meds:  aspirin EC  81 mg Oral Daily   bisacodyl  10 mg Rectal Once   Chlorhexidine Gluconate Cloth  6 each Topical Daily   cholecalciferol  2,000 Units Oral Daily   [START ON 03/16/2019] digoxin  0.125 mg Oral Daily   dronabinol  5 mg Oral BID AC   feeding supplement (ENSURE ENLIVE)  237 mL Oral BID BM   heparin  5,000 Units Subcutaneous Q8H   hydrocortisone sod succinate (SOLU-CORTEF) inj  50 mg Intravenous Q8H   insulin glargine  15 Units Subcutaneous Daily   lactulose  20 g Oral Daily   mouth rinse  15 mL Mouth Rinse BID   omega-3 acid ethyl esters  1 g Oral QPM   pantoprazole  40 mg Oral BID AC   potassium chloride  10 mEq Oral BID   senna-docusate  2 tablet Oral QHS   valACYclovir  1,000 mg Oral BID   Continuous Infusions:  dextrose 5 % and 0.45 % NaCl with KCl 20 mEq/L 100 mL/hr at 03/15/19 0544   phenylephrine (NEO-SYNEPHRINE) Adult infusion 20 mcg/min (03/15/19 1005)   piperacillin-tazobactam (ZOSYN)  IV     vancomycin     PRN Meds:.guaiFENesin-dextromethorphan, HYDROmorphone (DILAUDID) injection, meclizine, ondansetron (ZOFRAN) IV, oxyCODONE    Anti-infectives (From admission, onward)   Start     Dose/Rate  Route Frequency Ordered Stop   03/15/19 2300  vancomycin (VANCOCIN) 500 mg in sodium chloride 0.9 % 100 mL IVPB     500 mg 100 mL/hr over 60 Minutes Intravenous Every 12 hours 03/15/19 1033     03/15/19 1400  piperacillin-tazobactam (ZOSYN) IVPB 3.375 g     3.375 g 12.5 mL/hr over 240  Minutes Intravenous Every 8 hours 03/15/19 1011     03/15/19 1015  vancomycin (VANCOCIN) IVPB 1000 mg/200 mL premix     1,000 mg 200 mL/hr over 60 Minutes Intravenous  Once 03/15/19 1011 03/15/19 1131   03/07/2019 2200  valACYclovir (VALTREX) tablet 1,000 mg     1,000 mg Oral 2 times daily 03/13/2019 1921          Objective:   Vitals:   03/15/19 0800 03/15/19 0900 03/15/19 1000 03/15/19 1100  BP: (!) 85/47 (!) 111/55 (!) 88/70 (!) 108/57  Pulse: (!) 108 (!) 127 (!) 121 (!) 120  Resp: (!) 25 (!) 29 (!) 30 (!) 24  Temp:      TempSrc:      SpO2: 99% (!) 88% 97% 96%  Weight:      Height:        Wt Readings from Last 3 Encounters:  03/15/19 55.8 kg  03/09/19 50.6 kg  02/26/19 53.4 kg     Intake/Output Summary (Last 24 hours) at 03/15/2019 1229 Last data filed at 03/15/2019 0544 Gross per 24 hour  Intake 5752.99 ml  Output 950 ml  Net 4802.99 ml   Physical Exam Gen:- Awake Alert, some conversational dyspnea HEENT:- [place].AT, No sclera icterus Nose- [place] 5L/min Neck-Supple Neck,No JVD,.  Lungs-diminished in bases with bibasilar rhonchi  CV- S1, S2 normal, tachycardic but regular , Sternotomy scar (prior Aortic Valve Repair) abd-  +ve B.Sounds, Abd Soft, No tenderness,    Extremity/Skin:- No  edema, pedal pulses present  Psych-affect is appropriate, oriented x3 Neuro-generalized weakness, no new focal deficits, no tremors   Data Review:   Micro Results Recent Results (from the past 240 hour(s))  Urine culture     Status: None   Collection Time: 03/20/2019  9:57 AM   Specimen: Urine, Clean Catch  Result Value Ref Range Status   Specimen Description   Final    URINE, CLEAN CATCH Performed at [facility], [address], [place], [place] [address]    Special Requests   Final    NONE Performed at [facility], [address] [place], [place] [address]    Culture   Final    Multiple bacterial morphotypes present, none predominant. Suggest appropriate recollection if clinically  indicated.   Report Status 03/11/2019 FINAL  Final  SARS CORONAVIRUS 2 Nasal Swab Aptima Multi Swab     Status: None   Collection Time: 03/17/2019 11:12 AM   Specimen: Aptima Multi Swab; Nasal Swab  Result Value Ref Range Status   SARS Coronavirus 2 NEGATIVE NEGATIVE Final    Comment: (NOTE) SARS-CoV-2 target nucleic acids are NOT DETECTED. The SARS-CoV-2 RNA is generally detectable in upper and lower respiratory specimens during the acute phase of infection. Negative results do not preclude SARS-CoV-2 infection, do not rule out co-infections with other pathogens, and should not be used as the sole basis for treatment or other patient management decisions. Negative results must be combined with clinical observations, patient history, and epidemiological information. The expected result is Negative. Fact Sheet for Patients: SugarRoll.be Fact Sheet for Healthcare Providers: https://www.woods-mathews.com/ This test is not yet approved or cleared  by the [place] and  has been authorized for detection and/or diagnosis of SARS-CoV-2 by FDA under an Emergency Use Authorization (EUA). This EUA will remain  in effect (meaning this test can be used) for the duration of the COVID-19 declaration under Section 56 4(b)(1) of the Act, 21 U.S.C. section 360bbb-3(b)(1), unless the authorization is terminated or revoked sooner. Performed at [facility], [place] [address] [place], [place] [address]   MRSA PCR Screening     Status: None   Collection Time: 03/14/19  6:34 PM   Specimen: Nasal Mucosa; Nasopharyngeal  Result Value Ref Range Status   MRSA by PCR NEGATIVE NEGATIVE Final    Comment:        The GeneXpert MRSA Assay (FDA approved for NASAL specimens only), is one component of a comprehensive MRSA colonization surveillance program. It is not intended to diagnose MRSA infection nor to guide or monitor treatment for MRSA  infections. Performed at [facility] [address], [place], [place] [address]   Culture, blood (Routine X 2) w Reflex to ID Panel     Status: None (Preliminary result)   Collection Time: 03/15/19 10:57 AM   Specimen: BLOOD RIGHT HAND  Result Value Ref Range Status   Specimen Description   Final    BLOOD RIGHT HAND BOTTLES DRAWN AEROBIC AND ANAEROBIC   Special Requests   Final    Blood Culture adequate volume Performed at [facility], [address] [place], [place] [address]    Culture PENDING  Incomplete   Report Status PENDING  Incomplete  Culture, blood (Routine X 2) w Reflex to ID Panel     Status: None (Preliminary result)   Collection Time: 03/15/19 11:08 AM   Specimen: BLOOD RIGHT WRIST  Result Value Ref Range Status   Specimen Description   Final    BLOOD RIGHT WRIST BOTTLES DRAWN AEROBIC AND ANAEROBIC   Special Requests   Final    Blood Culture adequate volume Performed at [facility], [address] [place], [place] [address]    Culture PENDING  Incomplete   Report Status PENDING  Incomplete    Radiology Reports Ct Abdomen Pelvis Wo Contrast  Result Date: 03/14/2019 CLINICAL DATA:  Generalized abdominal pain and nausea for 1 week. EXAM: CT ABDOMEN AND PELVIS WITHOUT CONTRAST TECHNIQUE: Multidetector CT imaging of the abdomen and pelvis was performed following the standard protocol without IV contrast. COMPARISON:  12/09/2018 FINDINGS: Lower chest: There are small bilateral pleural effusions and bibasilar atelectasis. Area of dense subpleural scarring type changes noted in the right lower lobe medially. This has been present since 2015 but appears slightly larger. It measures approximately 2.7 x 2.4 cm and in 2015 measured 18 x 13 mm. Attention on future scans is suggested. Hepatobiliary: Stable nodular contour the liver and vague low-attenuation lesion in the central liver. Gallbladder demonstrates numerous small layering gallstones. No definite findings for acute  cholecystitis. No common bile duct dilatation. Pancreas: No mass, inflammation or ductal dilatation. Moderate pancreatic atrophy. Spleen: Borderline splenomegaly.  No focal lesions. Adrenals/Urinary Tract: Adrenal glands and kidneys are unremarkable. No worrisome renal lesions or renal calculi. The bladder is grossly normal. Stomach/Bowel: The stomach, duodenum, small bowel and colon are grossly normal without oral contrast. No acute inflammatory process, mass lesions or obstructive findings. There is moderate stool in the transverse and descending colon and a large amount of stool in the rectum which could suggest fecal impaction. Vascular/Lymphatic: Advanced atherosclerotic calcifications involving the aorta and  iliac arteries and branch vessels. Reproductive: The prostate gland and seminal vesicles are grossly normal. Moderate obscuration by artifact from a left hip prosthesis. Other: No obvious pelvic mass or pelvic lymphadenopathy. No inguinal mass or hernia. Musculoskeletal: Left hip prosthesis with significant artifact. No acute bony findings or destructive bony changes. Advanced facet disease noted in the lumbar spine. IMPRESSION: 1. Exam limited by patient motion and lack of IV and oral contrast. 2. Cholelithiasis but no definite CT findings for acute cholecystitis. 3. Stable cirrhotic changes involving the liver and vague area of low attenuation in the central liver. 4. No obvious acute abdominal/pelvic findings or lymphadenopathy. 5. Moderate stool in the transverse and descending colon and a large amount of stool in the rectum suggesting fecal impaction. 6. Advanced atherosclerotic calcifications throughout the abdomen and pelvis. No focal aneurysm. Electronically Signed   By: Marijo Sanes M.D.   On: 03/13/2019 10:49   Dg Chest 2 View  Result Date: 03/19/2019 CLINICAL DATA:  Generalized abdominal pain and nausea. EXAM: CHEST - 2 VIEW COMPARISON:  Abdominal CT 03/26/2019 and chest radiograph  03/02/2019 FINDINGS: Again noted is blunting at the costophrenic angles compatible with minimal pleural fluid or scarring based on the recent CT. Postsurgical changes compatible with aortic valve replacement. Stable scarring along the left lung apex. Heart size is within normal limits and stable. No pulmonary edema or significant airspace disease. No acute bone abnormality. IMPRESSION: 1. No acute cardiopulmonary disease. 2. Chronic blunting at the costophrenic angles compatible scarring and/or minimal pleural fluid based on recent CT. Electronically Signed   By: Markus Daft M.D.   On: 03/18/2019 10:57   Dg Chest 2 View  Result Date: 03/02/2019 CLINICAL DATA:  New onset cough and congestion, history type II diabetes mellitus, hepatocellular cancer, hypertension EXAM: CHEST - 2 VIEW COMPARISON:  02/07/2019 FINDINGS: Normal heart size post median sternotomy and AVR. Mediastinal contours and pulmonary vascularity normal. Atherosclerotic calcification aorta. Chronic interstitial changes with bibasilar scarring. No acute infiltrate, pleural effusion, or pneumothorax. Diffuse osseous demineralization. IMPRESSION: Chronic lung changes without acute abnormality. Electronically Signed   By: Lavonia Dana M.D.   On: 03/02/2019 16:26   Dg Chest Port 1 View  Result Date: 03/15/2019 CLINICAL DATA:  78 year old male with history of generalized abdominal pain and nausea for 1 week. Dry cough. EXAM: PORTABLE CHEST 1 VIEW COMPARISON:  Chest x-ray 03/09/2019. FINDINGS: Lung volumes have decreased. Patchy multifocal interstitial and airspace disease throughout the mid to lower lungs bilaterally (right greater than left), concerning for developing multilobar pneumonia. No definite pleural effusions. No evidence of pulmonary edema. Heart size is normal. Upper mediastinal contours are within normal limits. Aortic atherosclerosis. Status post median sternotomy for aortic valve replacement (a bioprosthetic valve projects over the  expected location of the aortic valve). IMPRESSION: 1. The appearance the chest is concerning for developing multilobar bilateral pneumonia, the appearance of which could be seen with both typical and atypical organisms, including viral etiologies. Further clinical evaluation is recommended. 2. Aortic atherosclerosis. Electronically Signed   By: Vinnie Langton M.D.   On: 03/15/2019 09:13   [place] Ekg Site Rite  Result Date: 03/15/2019 If Site Rite image not attached, placement could not be confirmed due to current cardiac rhythm.    CBC Recent Labs  Lab 04/03/2019 1045 03/11/19 0632 03/12/19 0446 03/15/19 0436  WBC 11.2* 8.7 6.1 8.1  HGB 15.5 13.2 12.2* 11.4*  HCT 48.1 41.9 38.0* 36.7*  PLT 219 151 106* 96*  MCV 90.8 93.1 92.2  95.1  MCH 29.2 29.3 29.6 29.5  MCHC 32.2 31.5 32.1 31.1  RDW 14.7 14.6 14.2 15.2  LYMPHSABS 0.6*  --   --   --   MONOABS 0.4  --   --   --   EOSABS 0.0  --   --   --   BASOSABS 0.1  --   --   --     Chemistries  Recent Labs  Lab 04/06/2019 1045 03/15/2019 1114 03/11/19 0632 03/12/19 0446 03/13/19 0549 03/14/19 1554 03/15/19 0436  NA 136  --  138 134* 133* 135 134*  K 4.2  --  3.9 2.8* 3.3* 3.6 4.9  CL 94*  --  99 104 103 105 104  CO2 29  --  28 25 23 22 22   GLUCOSE 217*  --  178* 98 71 65* 231*  BUN 29*  --  24* 15 11 15 17   CREATININE 0.79  --  0.56* 0.47* 0.54* 0.80 0.64  CALCIUM 9.2  --  8.5* 8.0* 8.0* 7.9* 8.1*  MG  --  2.4  --   --   --  1.8  --   AST 66*  --   --   --   --   --   --   ALT 94*  --   --   --   --   --   --   ALKPHOS 563*  --   --   --   --   --   --   BILITOT 1.9*  --   --   --   --   --   --    ------------------------------------------------------------------------------------------------------------------ No results for input(s): CHOL, HDL, LDLCALC, TRIG, CHOLHDL, LDLDIRECT in the last 72 hours.  Lab Results  Component Value Date   HGBA1C 8.6 (H) 08/19/2018    ------------------------------------------------------------------------------------------------------------------ No results for input(s): TSH, T4TOTAL, T3FREE, THYROIDAB in the last 72 hours.  Invalid input(s): FREET3 ------------------------------------------------------------------------------------------------------------------ No results for input(s): VITAMINB12, FOLATE, FERRITIN, TIBC, IRON, RETICCTPCT in the last 72 hours.  Coagulation profile No results for input(s): INR, PROTIME in the last 168 hours.  No results for input(s): DDIMER in the last 72 hours.  Cardiac Enzymes No results for input(s): CKMB, TROPONINI, MYOGLOBIN in the last 168 hours.  Invalid input(s): CK ------------------------------------------------------------------------------------------------------------------ No results found for: BNP   Roxan Hockey M.D on 03/15/2019 at 12:29 PM  Go to [email or web address] - for contact info  [facility] - Office  [phone]

## 2019-03-15 NOTE — Progress Notes (Signed)
Pharmacy Antibiotic Note  [name] is a 78 y.o. male admitted on 03/07/2019 with pneumonia.  Pharmacy has been consulted for zosyn and vancomycin dosing.  Plan: Vancomycin 500mg  IV every 12 hours.  Goal trough 15-20 mcg/mL. Zosyn 3.375g IV q8h (4 hour infusion).  Height: 5\' 8"  (172.7 cm) Weight: 123 lb 0.3 oz (55.8 kg) IBW/kg (Calculated) : 68.4  Temp (24hrs), Avg:98 F (36.7 C), Min:97.5 F (36.4 C), Max:98.2 F (36.8 C)  Recent Labs  Lab 03/30/2019 1045 03/11/19 0632 03/12/19 0446 03/13/19 0549 03/14/19 1554 03/15/19 0436  WBC 11.2* 8.7 6.1  --   --  8.1  CREATININE 0.79 0.56* 0.47* 0.54* 0.80 0.64    Estimated Creatinine Clearance: 59.1 mL/min (by C-G formula based on SCr of 0.64 mg/dL).    Allergies  Allergen Reactions  . Actos [Pioglitazone] Other (See Comments)    Cramps in legs   . Metformin And Related Diarrhea  . Morphine Hives  . Statins     REACTION: leg cramps but tolerates pravastatin    Antimicrobials this admission: 8/8 zosyn >>  8/8 vancomycin >>   Microbiology results: 8/4 UCx: none  8/8 MRSA PCR: negative - Dr Luan Pulling wants vancomycin anyways 8/4 Covid 19 negative   Thank you for allowing pharmacy to be a part of this patient's care.  Donna Christen Yusra Ravert 03/15/2019 10:34 AM

## 2019-03-15 NOTE — Consult Note (Signed)
Consult requested by: [facility] hospitalist, Dr. Denton Brick Consult requested for: Abnormal chest x-ray  HPI: This is a 78 year old with history of aortic stenosis diabetes hypertension hyperlipidemia hepatocellular cancer status post radiation and who came to the hospital with abdominal pain nausea vomiting failure to thrive.  He was treated and improved.  He had been seen by physical therapy and there was recommendation for skilled nursing facility placement and that was being arranged when he got much worse yesterday.  He developed tachycardia with heart rate in the 150s to 160s.  He ended up having to go to the stepdown unit was placed on digoxin and placed on pressors because of hypotension.  This morning he had chest x-ray done that shows multifocal infiltrates pneumonia versus pulmonary edema.  He was fluid resuscitated yesterday as well and was positive almost 5 L yesterday and a total of 6.3 L since admission.  He remains tachycardic but it is better.  He is known to have coronary disease but no chest pain.  He has previous history of cardiomyopathy but echocardiogram done in 2017 showed that his systolic function had returned to normal.  He has had aortic valve replacement.  He is not had any chest pain.  He is coughing and bringing up some yellowish sputum.  He denies chills.  No other new symptoms.  Past Medical History:  Diagnosis Date  . Aortic stenosis, mild   . Arthritis   . BPH (benign prostatic hypertrophy)   . CAD (coronary artery disease)   . Cancer (HCC)    spindle cell cancer of left ear, hepatocellular  . Constipation   . Diabetes mellitus    Type II  . Fracture, shoulder    left  . GERD (gastroesophageal reflux disease)   . History of cataract   . History of kidney stones    passed  . Hyperlipidemia   . Hypertension   . Nephrolithiasis   . Pancreatitis    acute  . Vitamin D deficiency      Family History  Problem Relation Age of Onset  . Heart disease Mother   .  Congestive Heart Failure Father   . Heart failure Father   . Heart failure Sister 37     Social History   Socioeconomic History  . Marital status: Married    Spouse name: Not on file  . Number of children: Not on file  . Years of education: Not on file  . Highest education level: Not on file  Occupational History  . Not on file  Social Needs  . Financial resource strain: Patient refused  . Food insecurity    Worry: Patient refused    Inability: Patient refused  . Transportation needs    Medical: No    Non-medical: No  Tobacco Use  . Smoking status: Former Smoker    Packs/day: 1.00    Years: 30.00    Pack years: 30.00    Quit date: 06/10/1999    Years since quitting: 19.7  . Smokeless tobacco: Former Systems developer    Types: Chew    Quit date: 04/09/2014  . Tobacco comment: Quit >15 years ago  Substance and Sexual Activity  . Alcohol use: No    Alcohol/week: 0.0 standard drinks  . Drug use: No  . Sexual activity: Not Currently  Lifestyle  . Physical activity    Days per week: Patient refused    Minutes per session: Patient refused  . Stress: Patient refused  Relationships  . Social connections  Talks on phone: Patient refused    Gets together: Patient refused    Attends religious service: Patient refused    Active member of club or organization: Patient refused    Attends meetings of clubs or organizations: Patient refused    Relationship status: Patient refused  Other Topics Concern  . Not on file  Social History Narrative   Married for >44 years     ROS: Except as mentioned 10 point review of systems is negative    Objective: Vital signs in last 24 hours: Temp:  [97.5 F (36.4 C)-98.2 F (36.8 C)] 98.1 F (36.7 C) (08/09 0400) Pulse Rate:  [107-165] 112 (08/09 0700) Resp:  [16-32] 16 (08/09 0700) BP: (79-133)/(47-74) 109/60 (08/09 0700) SpO2:  [83 %-100 %] 100 % (08/09 0700) Weight:  [55.8 kg] 55.8 kg (08/09 0500) Weight change: 2.2 kg Last BM Date:  03/14/19  Intake/Output from previous day: 08/08 0701 - 08/09 0700 In: 5753 [P.O.:240; I.V.:4963; IV Piggyback:550] Out: 950 [Urine:950]  PHYSICAL EXAM Constitutional he is very thin/cachectic he appears mildly short of breath.  Eyes: Pupils react EOMI.  Ears nose mouth and throat: Mucous membranes are dry.  Neck is supple.  Hearing is grossly normal.  Cardiovascular: His heart is regular with tachycardia.  Respiratory: Respiratory effort is increased.  He has rales bilaterally right more than left.  Gastrointestinal: His abdomen is thin.  No tenderness.  Musculoskeletal: He is weak.  Neurological: No focal abnormalities.  Psychiatric: Mildly anxious  Lab Results: Basic Metabolic Panel: Recent Labs    03/14/19 1554 03/15/19 0436  NA 135 134*  K 3.6 4.9  CL 105 104  CO2 22 22  GLUCOSE 65* 231*  BUN 15 17  CREATININE 0.80 0.64  CALCIUM 7.9* 8.1*  MG 1.8  --    Liver Function Tests: No results for input(s): AST, ALT, ALKPHOS, BILITOT, PROT, ALBUMIN in the last 72 hours. No results for input(s): LIPASE, AMYLASE in the last 72 hours. No results for input(s): AMMONIA in the last 72 hours. CBC: No results for input(s): WBC, NEUTROABS, HGB, HCT, MCV, PLT in the last 72 hours. Cardiac Enzymes: No results for input(s): CKTOTAL, CKMB, CKMBINDEX, TROPONINI in the last 72 hours. BNP: No results for input(s): PROBNP in the last 72 hours. D-Dimer: No results for input(s): DDIMER in the last 72 hours. CBG: No results for input(s): GLUCAP in the last 72 hours. Hemoglobin A1C: No results for input(s): HGBA1C in the last 72 hours. Fasting Lipid Panel: No results for input(s): CHOL, HDL, LDLCALC, TRIG, CHOLHDL, LDLDIRECT in the last 72 hours. Thyroid Function Tests: No results for input(s): TSH, T4TOTAL, FREET4, T3FREE, THYROIDAB in the last 72 hours. Anemia Panel: No results for input(s): VITAMINB12, FOLATE, FERRITIN, TIBC, IRON, RETICCTPCT in the last 72 hours. Coagulation: No results  for input(s): LABPROT, INR in the last 72 hours. Urine Drug Screen: Drugs of Abuse  No results found for: LABOPIA, COCAINSCRNUR, LABBENZ, AMPHETMU, THCU, LABBARB  Alcohol Level: No results for input(s): ETH in the last 72 hours. Urinalysis: No results for input(s): COLORURINE, LABSPEC, PHURINE, GLUCOSEU, HGBUR, BILIRUBINUR, KETONESUR, PROTEINUR, UROBILINOGEN, NITRITE, LEUKOCYTESUR in the last 72 hours.  Invalid input(s): APPERANCEUR Misc. Labs:   ABGS: No results for input(s): PHART, PO2ART, TCO2, HCO3 in the last 72 hours.  Invalid input(s): PCO2   MICROBIOLOGY: Recent Results (from the past 240 hour(s))  Urine culture     Status: None   Collection Time: 03/21/2019  9:57 AM   Specimen: Urine, Clean Catch  Result Value Ref Range Status   Specimen Description   Final    URINE, CLEAN CATCH Performed at [facility], [address], [place], [place] [address]    Special Requests   Final    NONE Performed at [facility], [address], [place], [place] [address]    Culture   Final    Multiple bacterial morphotypes present, none predominant. Suggest appropriate recollection if clinically indicated.   Report Status 03/11/2019 FINAL  Final  SARS CORONAVIRUS 2 Nasal Swab Aptima Multi Swab     Status: None   Collection Time: 03/31/2019 11:12 AM   Specimen: Aptima Multi Swab; Nasal Swab  Result Value Ref Range Status   SARS Coronavirus 2 NEGATIVE NEGATIVE Final    Comment: (NOTE) SARS-CoV-2 target nucleic acids are NOT DETECTED. The SARS-CoV-2 RNA is generally detectable in upper and lower respiratory specimens during the acute phase of infection. Negative results do not preclude SARS-CoV-2 infection, do not rule out co-infections with other pathogens, and should not be used as the sole basis for treatment or other patient management decisions. Negative results must be combined with clinical observations, patient history, and epidemiological information. The expected result is  Negative. Fact Sheet for Patients: SugarRoll.be Fact Sheet for Healthcare Providers: https://www.woods-mathews.com/ This test is not yet approved or cleared by the [place] FDA and  has been authorized for detection and/or diagnosis of SARS-CoV-2 by FDA under an Emergency Use Authorization (EUA). This EUA will remain  in effect (meaning this test can be used) for the duration of the COVID-19 declaration under Section 56 4(b)(1) of the Act, 21 U.S.C. section 360bbb-3(b)(1), unless the authorization is terminated or revoked sooner. Performed at [facility], [place] [address] [place], [place] [address]   MRSA PCR Screening     Status: None   Collection Time: 03/14/19  6:34 PM   Specimen: Nasal Mucosa; Nasopharyngeal  Result Value Ref Range Status   MRSA by PCR NEGATIVE NEGATIVE Final    Comment:        The GeneXpert MRSA Assay (FDA approved for NASAL specimens only), is one component of a comprehensive MRSA colonization surveillance program. It is not intended to diagnose MRSA infection nor to guide or monitor treatment for MRSA infections. Performed at [facility], [address], [place], [place] [address]     Studies/Results: Dg Chest [place] 1 View  Result Date: 03/15/2019 CLINICAL DATA:  78 year old male with history of generalized abdominal pain and nausea for 1 week. Dry cough. EXAM: PORTABLE CHEST 1 VIEW COMPARISON:  Chest x-ray 03/16/2019. FINDINGS: Lung volumes have decreased. Patchy multifocal interstitial and airspace disease throughout the mid to lower lungs bilaterally (right greater than left), concerning for developing multilobar pneumonia. No definite pleural effusions. No evidence of pulmonary edema. Heart size is normal. Upper mediastinal contours are within normal limits. Aortic atherosclerosis. Status post median sternotomy for aortic valve replacement (a bioprosthetic valve projects over the expected location of  the aortic valve). IMPRESSION: 1. The appearance the chest is concerning for developing multilobar bilateral pneumonia, the appearance of which could be seen with both typical and atypical organisms, including viral etiologies. Further clinical evaluation is recommended. 2. Aortic atherosclerosis. Electronically Signed   By: Vinnie [place]ton M.D.   On: 03/15/2019 09:13   [place] Ekg Site Rite  Result Date: 03/15/2019 If Site Rite image not attached, placement could not be confirmed due to current cardiac rhythm.   Medications:  Prior to Admission:  Medications Prior to Admission  Medication Sig Dispense Refill Last Dose  . aspirin 81 MG EC tablet Take 1 tablet (81 mg total) by mouth daily. Swallow whole. 90 tablet 1 Past Week at Unknown time  . Cholecalciferol (VITAMIN D) 2000 units tablet Take 1 tablet (2,000 Units total) by mouth daily. 90 tablet 1 Past Week at Unknown time  . fish oil-omega-3 fatty acids 1000 MG capsule Take 1 g by mouth every evening.    Past Week at Unknown time  . meclizine (ANTIVERT) 12.5 MG tablet Take 1 tablet (12.5 mg total) by mouth 3 (three) times daily as needed for dizziness. 20 tablet 0   . metoprolol succinate (TOPROL-XL) 50 MG 24 hr tablet TAKE 1 TABLET DAILY WITH ORIMMEDIATELY FOLLOWING A    MEAL 90 tablet 1 Past Week at Unknown time  . omeprazole (PRILOSEC) 20 MG capsule Take 1 capsule (20 mg total) by mouth daily. 90 capsule 1 Past Week at Unknown time  . ondansetron (ZOFRAN) 4 MG tablet TAKE 1 TABLET BY MOUTH EVERY 8 HOURS AS NEEDED FOR NAUSEA AND VOMITING 30 tablet 0 Past Week at Unknown time  . polyethylene glycol powder (GLYCOLAX/MIRALAX) 17 GM/SCOOP powder Take 17 g by mouth daily. 507 g 3   . traMADol (ULTRAM) 50 MG tablet Take 50 mg by mouth every 6 (six) hours as needed for moderate pain or severe pain.      . [DISCONTINUED] dronabinol (MARINOL) 2.5 MG capsule Take 1 capsule (2.5 mg total) by mouth 2 (two) times daily before a meal. 60 capsule 0 Past Week at  Unknown time  . [DISCONTINUED] potassium chloride (K-DUR) 10 MEQ tablet Take 1 tablet (10 mEq total) by mouth 2 (two) times daily. 60 tablet 2 Past Week at Unknown time  . Lactulose 20 GM/30ML SOLN Take 30 mLs (20 g total) by mouth at bedtime. (Patient not taking: Reported on 02/26/2019) 450 mL 1 Not Taking at Unknown time  . oxyCODONE (ROXICODONE) 5 MG immediate release tablet Take 1 tablet (5 mg total) by mouth every 4 (four) hours as needed for severe pain. (Patient not taking: Reported on 02/26/2019) 30 tablet 0 Not Taking at Unknown time  . valACYclovir (VALTREX) 1000 MG tablet Take 1 tablet (1,000 mg total) by mouth 2 (two) times daily. (Patient not taking: Reported on 03/09/2019) 14 tablet 0 Not Taking at Unknown time  . [DISCONTINUED] Insulin Glargine (BASAGLAR KWIKPEN) 100 UNIT/ML SOPN Inject 0.36 mLs (36 Units total) into the skin daily. Give number of units as directed at office visit, up to 30 units daily. (Patient not taking: Reported on 02/26/2019) 5 pen 3 Not Taking at Unknown time   Scheduled: . aspirin EC  81 mg Oral Daily  . bisacodyl  10 mg Rectal Once  . Chlorhexidine Gluconate Cloth  6 each Topical Daily  . cholecalciferol  2,000 Units Oral Daily  . digoxin  0.25 mg Intravenous Once  . [START ON 03/16/2019] digoxin  0.125 mg Oral Daily  . dronabinol  5 mg Oral BID AC  . feeding supplement (ENSURE ENLIVE)  237 mL Oral BID BM  . heparin  5,000 Units Subcutaneous Q8H  . hydrocortisone sod succinate (SOLU-CORTEF) inj  50 mg Intravenous Q8H  . insulin glargine  15 Units Subcutaneous Daily  . lactulose  20 g Oral Daily  . mouth rinse  15 mL Mouth Rinse BID  . omega-3 acid ethyl esters  1 g Oral QPM  . pantoprazole  40 mg Oral BID AC  . potassium chloride  10  mEq Oral BID  . senna-docusate  2 tablet Oral QHS  . valACYclovir  1,000 mg Oral BID   Continuous: . dextrose 5 % and 0.45 % NaCl with KCl 20 mEq/L 100 mL/hr at 03/15/19 0544  . phenylephrine (NEO-SYNEPHRINE) Adult infusion  20 mcg/min (03/15/19 0544)   [facility]:[id number], HYDROmorphone (DILAUDID) injection, meclizine, ondansetron (ZOFRAN) IV, oxyCODONE  Assesment: He was admitted with failure to thrive.  He is known to have hepatocellular cancer which is been treated with radiation.  He has protein calorie malnutrition.  Plans were being made for him to go to skilled care facility when he had acute deterioration yesterday.  He has been tachycardic.  Chest x-ray this morning shows bilateral infiltrates.  I think this is likely pneumonia and assumption would be that it was from aspiration considering his failure to thrive and malnutrition.  He may have some element of sepsis considering his hypotension  it may be volume overload also because of his volume resuscitation for his tachycardia. Principal Problem:   Orthostatic dizziness Active Problems:   Essential hypertension   GERD   Constipation   Cardiomyopathy, dilated, nonischemic (HCC)   Cancer, hepatocellular (HCC)   Failure to thrive in adult   Nausea in adult patient   Protein-calorie malnutrition, severe   Dehydration    Plan: I would plan to go ahead and treat for pneumonia. Zosyn and vancomycin.  I am not sure if we can diurese him any because of his tachycardia and his blood pressure   LOS: 4 days   [name] 03/15/2019, 9:33 AM

## 2019-03-15 NOTE — Progress Notes (Signed)
Received order for H&H due to 6 episodes of bloody diarrhea. Holding Senokot dose due to diarrhea. Continue to monitor.

## 2019-03-15 NOTE — Progress Notes (Signed)
Spoke with RN re PICC order.  Aware to notify [facility] if PICC needed today.

## 2019-03-16 ENCOUNTER — Inpatient Hospital Stay (HOSPITAL_COMMUNITY): Payer: Medicare HMO

## 2019-03-16 ENCOUNTER — Ambulatory Visit (HOSPITAL_COMMUNITY): Payer: Medicare HMO | Admitting: Hematology

## 2019-03-16 ENCOUNTER — Other Ambulatory Visit (HOSPITAL_COMMUNITY): Payer: Medicare HMO

## 2019-03-16 DIAGNOSIS — Z515 Encounter for palliative care: Secondary | ICD-10-CM

## 2019-03-16 DIAGNOSIS — I361 Nonrheumatic tricuspid (valve) insufficiency: Secondary | ICD-10-CM

## 2019-03-16 DIAGNOSIS — J9600 Acute respiratory failure, unspecified whether with hypoxia or hypercapnia: Secondary | ICD-10-CM

## 2019-03-16 DIAGNOSIS — Z7189 Other specified counseling: Secondary | ICD-10-CM

## 2019-03-16 DIAGNOSIS — I340 Nonrheumatic mitral (valve) insufficiency: Secondary | ICD-10-CM

## 2019-03-16 LAB — CBC
HCT: 30.6 % — ABNORMAL LOW (ref 39.0–52.0)
Hemoglobin: 9.7 g/dL — ABNORMAL LOW (ref 13.0–17.0)
MCH: 29.5 pg (ref 26.0–34.0)
MCHC: 31.7 g/dL (ref 30.0–36.0)
MCV: 93 fL (ref 80.0–100.0)
Platelets: 85 10*3/uL — ABNORMAL LOW (ref 150–400)
RBC: 3.29 MIL/uL — ABNORMAL LOW (ref 4.22–5.81)
RDW: 14.9 % (ref 11.5–15.5)
WBC: 5.9 10*3/uL (ref 4.0–10.5)
nRBC: 0 % (ref 0.0–0.2)

## 2019-03-16 LAB — COMPREHENSIVE METABOLIC PANEL
ALT: 54 U/L — ABNORMAL HIGH (ref 0–44)
AST: 40 U/L (ref 15–41)
Albumin: 1.8 g/dL — ABNORMAL LOW (ref 3.5–5.0)
Alkaline Phosphatase: 283 U/L — ABNORMAL HIGH (ref 38–126)
Anion gap: 4 — ABNORMAL LOW (ref 5–15)
BUN: 13 mg/dL (ref 8–23)
CO2: 25 mmol/L (ref 22–32)
Calcium: 7.6 mg/dL — ABNORMAL LOW (ref 8.9–10.3)
Chloride: 104 mmol/L (ref 98–111)
Creatinine, Ser: 0.43 mg/dL — ABNORMAL LOW (ref 0.61–1.24)
GFR calc Af Amer: >60 mL/min (ref >60)
GFR calc non Af Amer: >60 mL/min (ref >60)
Glucose, Bld: 208 mg/dL — ABNORMAL HIGH (ref 70–99)
Potassium: 3.8 mmol/L (ref 3.5–5.1)
Sodium: 133 mmol/L — ABNORMAL LOW (ref 135–145)
Total Bilirubin: 1.1 mg/dL (ref 0.3–1.2)
Total Protein: 4.4 g/dL — ABNORMAL LOW (ref 6.5–8.1)

## 2019-03-16 LAB — ECHOCARDIOGRAM COMPLETE
Height: 68 in
Weight: 2014.12 oz

## 2019-03-16 MED ORDER — VANCOMYCIN HCL 500 MG IV SOLR
500.0000 mg | Freq: Two times a day (BID) | INTRAVENOUS | Status: AC
  Administered 2019-03-16 (×2): 500 mg via INTRAVENOUS
  Filled 2019-03-16 (×2): qty 500

## 2019-03-16 NOTE — Progress Notes (Signed)
*  PRELIMINARY RESULTS* Echocardiogram 2D Echocardiogram has been performed.  Leavy Cella 03/16/2019, 9:25 AM

## 2019-03-16 NOTE — Progress Notes (Signed)
Subjective: He says he feels better than yesterday.  His heart rate has come down.  He is on antibiotics for pneumonia.  He remains on Neo-Synephrine for blood pressure support he says he is coughing mostly nonproductively.  No chest pain.  No other new complaints.  He had some bloody stools yesterday.  PICC line was placed yesterday.  Echocardiogram was attempted but his heart rate was too high to get good images  Objective: Vital signs in last 24 hours: Temp:  [97.3 F (36.3 C)-98.2 F (36.8 C)] 97.4 F (36.3 C) (08/10 0728) Pulse Rate:  [95-127] 101 (08/10 0728) Resp:  [10-32] 27 (08/10 0728) BP: (85-138)/(47-71) 117/61 (08/10 0715) SpO2:  [88 %-100 %] 95 % (08/10 0728) Weight:  [57.1 kg] 57.1 kg (08/10 0500) Weight change: 1.3 kg Last BM Date: 03/15/19  Intake/Output from previous day: 08/09 0701 - 08/10 0700 In: 3413.4 [P.O.:120; I.V.:2905.8; IV Piggyback:387.6] Out: 2127 [Urine:2125; Stool:2]  PHYSICAL EXAM General appearance: He is alert and cooperative.  Looks better than yesterday.  Very thin. Resp: He has bilateral rales right more than left Cardio: His heart is regular with much better heart rate at about 100 GI: soft, non-tender; bowel sounds normal; no masses,  no organomegaly Extremities: extremities normal, atraumatic, no cyanosis or edema  Lab Results:  Results for orders placed or performed during the hospital encounter of 03/13/2019 (from the past 48 hour(s))  Basic metabolic panel     Status: Abnormal   Collection Time: 03/14/19  3:54 PM  Result Value Ref Range   Sodium 135 135 - 145 mmol/L   Potassium 3.6 3.5 - 5.1 mmol/L   Chloride 105 98 - 111 mmol/L   CO2 22 22 - 32 mmol/L   Glucose, Bld 65 (L) 70 - 99 mg/dL   BUN 15 8 - 23 mg/dL   Creatinine, Ser 0.80 0.61 - 1.24 mg/dL   Calcium 7.9 (L) 8.9 - 10.3 mg/dL   GFR calc non Af Amer >60 >60 mL/min   GFR calc Af Amer >60 >60 mL/min   Anion gap 8 5 - 15    Comment: Performed at [facility], [address], [place], [place] [address]  Magnesium     Status: None   Collection Time: 03/14/19  3:54 PM  Result Value Ref Range   Magnesium 1.8 1.7 - 2.4 mg/dL    Comment: Performed at [facility], [address], [place], [place] [address]  MRSA PCR Screening     Status: None   Collection Time: 03/14/19  6:34 PM   Specimen: Nasal Mucosa; Nasopharyngeal  Result Value Ref Range   MRSA by PCR NEGATIVE NEGATIVE    Comment:        The GeneXpert MRSA Assay (FDA approved for NASAL specimens only), is one component of a comprehensive MRSA colonization surveillance program. It is not intended to diagnose MRSA infection nor to guide or monitor treatment for MRSA infections. Performed at [facility], [address] [place], [place] [address]   Troponin I (High Sensitivity)     Status: Abnormal   Collection Time: 03/14/19  7:15 PM  Result Value Ref Range   Troponin I (High Sensitivity) 215 (HH) <18 ng/L    Comment: CRITICAL RESULT CALLED TO, READ BACK BY AND VERIFIED WITH: WAGNER,R @ 2145 ON 03/14/19 BY JUW Performed at [facility], [address], [place], [place] [address]   Basic metabolic panel     Status: Abnormal   Collection Time: 03/15/19  4:36 AM  Result Value Ref Range   Sodium 134 (L) 135 - 145 mmol/L   Potassium 4.9 3.5 - 5.1 mmol/L    Comment: DELTA CHECK NOTED   Chloride 104 98 - 111 mmol/L   CO2 22 22 - 32 mmol/L   Glucose, Bld 231 (H) 70 - 99 mg/dL   BUN 17 8 - 23 mg/dL   Creatinine, Ser 0.64 0.61 - 1.24 mg/dL   Calcium 8.1 (L) 8.9 - 10.3 mg/dL   GFR calc non Af Amer >60 >60 mL/min   GFR calc Af Amer >60 >60 mL/min   Anion gap 8 5 - 15    Comment: Performed at [facility], [address], [place], [place] [address]  CBC     Status: Abnormal   Collection Time: 03/15/19  4:36 AM  Result Value Ref Range   WBC 8.1 4.0 - 10.5 K/uL   RBC 3.86 (L) 4.22 - 5.81 MIL/uL   Hemoglobin 11.4 (L) 13.0 - 17.0 g/dL   HCT 36.7 (L) 39.0 - 52.0 %   MCV 95.1 80.0 - 100.0 fL   MCH 29.5 26.0 -  34.0 pg   MCHC 31.1 30.0 - 36.0 g/dL   RDW 15.2 11.5 - 15.5 %   Platelets 96 (L) 150 - 400 K/uL    Comment: REPEATED TO VERIFY SPECIMEN CHECKED FOR CLOTS Immature Platelet Fraction may be clinically indicated, consider ordering this additional test [id number] CONSISTENT WITH PREVIOUS RESULT    nRBC 0.0 0.0 - 0.2 %    Comment: Performed at [facility], [address] [place], [place] [address]  Culture, blood (Routine X 2) w Reflex to ID Panel     Status: None (Preliminary result)   Collection Time: 03/15/19 10:57 AM   Specimen: BLOOD RIGHT HAND  Result Value Ref Range   Specimen Description      BLOOD RIGHT HAND BOTTLES DRAWN AEROBIC AND ANAEROBIC   Special Requests Blood Culture adequate volume    Culture      NO GROWTH <12 HOURS Performed at [facility], [address], [place], [place] [address]    Report Status PENDING   Culture, blood (Routine X 2) w Reflex to ID Panel     Status: None (Preliminary result)   Collection Time: 03/15/19 11:08 AM   Specimen: BLOOD RIGHT WRIST  Result Value Ref Range   Specimen Description      BLOOD RIGHT WRIST BOTTLES DRAWN AEROBIC AND ANAEROBIC   Special Requests Blood Culture adequate volume    Culture      NO GROWTH <12 HOURS Performed at [facility], [address], [place], [place] [address]    Report Status PENDING   Occult blood card to lab, stool RN will collect     Status: Abnormal   Collection Time: 03/15/19  4:02 PM  Result Value Ref Range   Fecal Occult Bld POSITIVE (A) NEGATIVE    Comment: Performed at [facility], [address], [place], [place] [address]  Expectorated sputum assessment w rflx to resp cult     Status: None   Collection Time: 03/15/19  5:55 PM   Specimen: Expectorated Sputum  Result Value Ref Range   Specimen Description EXPECTORATED SPUTUM    Special Requests Normal    Sputum evaluation      THIS SPECIMEN IS ACCEPTABLE FOR SPUTUM CULTURE Performed at [facility], [address], [place],   [address]    Report Status 03/15/2019 FINAL   Hemoglobin and hematocrit, blood  Status: Abnormal   Collection Time: 03/15/19  8:46 PM  Result Value Ref Range   Hemoglobin 10.0 (L) 13.0 - 17.0 g/dL   HCT 31.1 (L) 39.0 - 52.0 %    Comment: Performed at [facility], [address], [place], [place] [address]  CBC     Status: Abnormal   Collection Time: 03/16/19  4:08 AM  Result Value Ref Range   WBC 5.9 4.0 - 10.5 K/uL   RBC 3.29 (L) 4.22 - 5.81 MIL/uL   Hemoglobin 9.7 (L) 13.0 - 17.0 g/dL   HCT 30.6 (L) 39.0 - 52.0 %   MCV 93.0 80.0 - 100.0 fL   MCH 29.5 26.0 - 34.0 pg   MCHC 31.7 30.0 - 36.0 g/dL   RDW 14.9 11.5 - 15.5 %   Platelets 85 (L) 150 - 400 K/uL    Comment: REPEATED TO VERIFY PLATELET COUNT CONFIRMED BY SMEAR SPECIMEN CHECKED FOR CLOTS Immature Platelet Fraction may be clinically indicated, consider ordering this additional test [id number]    nRBC 0.0 0.0 - 0.2 %    Comment: Performed at [facility], [address], [place], [place] [address]  Comprehensive metabolic panel     Status: Abnormal   Collection Time: 03/16/19  4:08 AM  Result Value Ref Range   Sodium 133 (L) 135 - 145 mmol/L   Potassium 3.8 3.5 - 5.1 mmol/L    Comment: DELTA CHECK NOTED   Chloride 104 98 - 111 mmol/L   CO2 25 22 - 32 mmol/L   Glucose, Bld 208 (H) 70 - 99 mg/dL   BUN 13 8 - 23 mg/dL   Creatinine, Ser 0.43 (L) 0.61 - 1.24 mg/dL   Calcium 7.6 (L) 8.9 - 10.3 mg/dL   Total Protein 4.4 (L) 6.5 - 8.1 g/dL   Albumin 1.8 (L) 3.5 - 5.0 g/dL   AST 40 15 - 41 U/L   ALT 54 (H) 0 - 44 U/L   Alkaline Phosphatase 283 (H) 38 - 126 U/L   Total Bilirubin 1.1 0.3 - 1.2 mg/dL   GFR calc non Af Amer >60 >60 mL/min   GFR calc Af Amer >60 >60 mL/min   Anion gap 4 (L) 5 - 15    Comment: Performed at [facility], [address] [place], [place] [address]    ABGS No results for input(s): PHART, PO2ART, TCO2, HCO3 in the last 72 hours.  Invalid input(s): PCO2 CULTURES Recent Results (from the past  240 hour(s))  Urine culture     Status: None   Collection Time: 04/03/2019  9:57 AM   Specimen: Urine, Clean Catch  Result Value Ref Range Status   Specimen Description   Final    URINE, CLEAN CATCH Performed at [facility], [address], [place], [place] [address]    Special Requests   Final    NONE Performed at [facility], [address] [place], [place] [address]    Culture   Final    Multiple bacterial morphotypes present, none predominant. Suggest appropriate recollection if clinically indicated.   Report Status 03/11/2019 FINAL  Final  SARS CORONAVIRUS 2 Nasal Swab Aptima Multi Swab     Status: None   Collection Time: 04/05/2019 11:12 AM   Specimen: Aptima Multi Swab; Nasal Swab  Result Value Ref Range Status   SARS Coronavirus 2 NEGATIVE NEGATIVE Final    Comment: (NOTE) SARS-CoV-2 target nucleic acids are NOT DETECTED. The SARS-CoV-2 RNA is generally detectable in upper and lower respiratory specimens  during the acute phase of infection. Negative results do not preclude SARS-CoV-2 infection, do not rule out co-infections with other pathogens, and should not be used as the sole basis for treatment or other patient management decisions. Negative results must be combined with clinical observations, patient history, and epidemiological information. The expected result is Negative. Fact Sheet for Patients: SugarRoll.be Fact Sheet for Healthcare Providers: https://www.woods-mathews.com/ This test is not yet approved or cleared by the [place] FDA and  has been authorized for detection and/or diagnosis of SARS-CoV-2 by FDA under an Emergency Use Authorization (EUA). This EUA will remain  in effect (meaning this test can be used) for the duration of the COVID-19 declaration under Section 56 4(b)(1) of the Act, 21 U.S.C. section 360bbb-3(b)(1), unless the authorization is terminated or revoked sooner. Performed at [facility], [place] [address] [place], [place] [address]   MRSA PCR Screening     Status: None   Collection Time: 03/14/19  6:34 PM   Specimen: Nasal Mucosa; Nasopharyngeal  Result Value Ref Range Status   MRSA by PCR NEGATIVE NEGATIVE Final    Comment:        The GeneXpert MRSA Assay (FDA approved for NASAL specimens only), is one component of a comprehensive MRSA colonization surveillance program. It is not intended to diagnose MRSA infection nor to guide or monitor treatment for MRSA infections. Performed at [facility], [address], [place], [place] [address]   Culture, blood (Routine X 2) w Reflex to ID Panel     Status: None (Preliminary result)   Collection Time: 03/15/19 10:57 AM   Specimen: BLOOD RIGHT HAND  Result Value Ref Range Status   Specimen Description   Final    BLOOD RIGHT HAND BOTTLES DRAWN AEROBIC AND ANAEROBIC   Special Requests Blood Culture adequate volume  Final   Culture   Final    NO GROWTH <12 HOURS Performed at [facility], [address] [place], [place] [address]    Report Status PENDING  Incomplete  Culture, blood (Routine X 2) w Reflex to ID Panel     Status: None (Preliminary result)   Collection Time: 03/15/19 11:08 AM   Specimen: BLOOD RIGHT WRIST  Result Value Ref Range Status   Specimen Description   Final    BLOOD RIGHT WRIST BOTTLES DRAWN AEROBIC AND ANAEROBIC   Special Requests Blood Culture adequate volume  Final   Culture   Final    NO GROWTH <12 HOURS Performed at [facility] [address], [place], [place] [address]    Report Status PENDING  Incomplete  Expectorated sputum assessment w rflx to resp cult     Status: None   Collection Time: 03/15/19  5:55 PM   Specimen: Expectorated Sputum  Result Value Ref Range Status   Specimen Description EXPECTORATED SPUTUM  Final   Special Requests Normal  Final   Sputum evaluation   Final    THIS SPECIMEN IS ACCEPTABLE FOR SPUTUM CULTURE Performed at [facility], [address], [place], [place] [address]    Report Status 03/15/2019 FINAL  Final   Studies/Results: Dg Chest Port 1 View  Result Date: 03/15/2019 CLINICAL DATA:  PICC line placement. EXAM: PORTABLE CHEST 1 VIEW COMPARISON:  March 15, 2019 FINDINGS: Right-sided PICC line terminates at the expected location of the cavoatrial junction. Cardiomediastinal silhouette is normal. Mediastinal contours appear intact. Calcific atherosclerotic disease of the aorta. There is no evidence of pneumothorax. Patchy bilateral lower lobe airspace opacities,  right greater than left. Probable right pleural effusion. Osseous structures are without acute abnormality. Soft tissues are grossly normal. IMPRESSION: 1. Right-sided PICC line terminates at the expected location of the cavoatrial junction. 2. Patchy bilateral lower lobe airspace opacities, right greater than left. 3. Probable right pleural effusion. 4. No evidence of pneumothorax. Electronically Signed   By: Fidela Salisbury M.D.   On: 03/15/2019 15:47   Dg Chest Port 1 View  Result Date: 03/15/2019 CLINICAL DATA:  78 year old male with history of generalized abdominal pain and nausea for 1 week. Dry cough. EXAM: PORTABLE CHEST 1 VIEW COMPARISON:  Chest x-ray 03/12/2019. FINDINGS: Lung volumes have decreased. Patchy multifocal interstitial and airspace disease throughout the mid to lower lungs bilaterally (right greater than left), concerning for developing multilobar pneumonia. No definite pleural effusions. No evidence of pulmonary edema. Heart size is normal. Upper mediastinal contours are within normal limits. Aortic atherosclerosis. Status post median sternotomy for aortic valve replacement (a bioprosthetic valve projects over the expected location of the aortic valve). IMPRESSION: 1. The appearance the chest is concerning for developing multilobar bilateral pneumonia, the appearance of which could be seen with both typical and atypical organisms, including viral etiologies.  Further clinical evaluation is recommended. 2. Aortic atherosclerosis. Electronically Signed   By: Vinnie Langton M.D.   On: 03/15/2019 09:13   [place] Ekg Site Rite  Result Date: 03/15/2019 If Site Rite image not attached, placement could not be confirmed due to current cardiac rhythm.   Medications:  Prior to Admission:  Medications Prior to Admission  Medication Sig Dispense Refill Last Dose  . aspirin 81 MG EC tablet Take 1 tablet (81 mg total) by mouth daily. Swallow whole. 90 tablet 1 Past Week at Unknown time  . Cholecalciferol (VITAMIN D) 2000 units tablet Take 1 tablet (2,000 Units total) by mouth daily. 90 tablet 1 Past Week at Unknown time  . fish oil-omega-3 fatty acids 1000 MG capsule Take 1 g by mouth every evening.    Past Week at Unknown time  . meclizine (ANTIVERT) 12.5 MG tablet Take 1 tablet (12.5 mg total) by mouth 3 (three) times daily as needed for dizziness. 20 tablet 0   . metoprolol succinate (TOPROL-XL) 50 MG 24 hr tablet TAKE 1 TABLET DAILY WITH ORIMMEDIATELY FOLLOWING A    MEAL 90 tablet 1 Past Week at Unknown time  . omeprazole (PRILOSEC) 20 MG capsule Take 1 capsule (20 mg total) by mouth daily. 90 capsule 1 Past Week at Unknown time  . ondansetron (ZOFRAN) 4 MG tablet TAKE 1 TABLET BY MOUTH EVERY 8 HOURS AS NEEDED FOR NAUSEA AND VOMITING 30 tablet 0 Past Week at Unknown time  . polyethylene glycol powder (GLYCOLAX/MIRALAX) 17 GM/SCOOP powder Take 17 g by mouth daily. 507 g 3   . traMADol (ULTRAM) 50 MG tablet Take 50 mg by mouth every 6 (six) hours as needed for moderate pain or severe pain.      . [DISCONTINUED] dronabinol (MARINOL) 2.5 MG capsule Take 1 capsule (2.5 mg total) by mouth 2 (two) times daily before a meal. 60 capsule 0 Past Week at Unknown time  . [DISCONTINUED] potassium chloride (K-DUR) 10 MEQ tablet Take 1 tablet (10 mEq total) by mouth 2 (two) times daily. 60 tablet 2 Past Week at Unknown time  . Lactulose 20 GM/30ML SOLN Take 30 mLs (20 g total) by  mouth at bedtime. (Patient not taking: Reported on 02/26/2019) 450 mL 1 Not Taking at Unknown time  . oxyCODONE (ROXICODONE)  5 MG immediate release tablet Take 1 tablet (5 mg total) by mouth every 4 (four) hours as needed for severe pain. (Patient not taking: Reported on 02/26/2019) 30 tablet 0 Not Taking at Unknown time  . valACYclovir (VALTREX) 1000 MG tablet Take 1 tablet (1,000 mg total) by mouth 2 (two) times daily. (Patient not taking: Reported on 03/09/2019) 14 tablet 0 Not Taking at Unknown time  . [DISCONTINUED] Insulin Glargine (BASAGLAR KWIKPEN) 100 UNIT/ML SOPN Inject 0.36 mLs (36 Units total) into the skin daily. Give number of units as directed at office visit, up to 30 units daily. (Patient not taking: Reported on 02/26/2019) 5 pen 3 Not Taking at Unknown time   Scheduled: . aspirin EC  81 mg Oral Daily  . bisacodyl  10 mg Rectal Once  . Chlorhexidine Gluconate Cloth  6 each Topical Daily  . cholecalciferol  2,000 Units Oral Daily  . digoxin  0.125 mg Oral Daily  . dronabinol  5 mg Oral BID AC  . feeding supplement (ENSURE ENLIVE)  237 mL Oral BID BM  . heparin  5,000 Units Subcutaneous Q8H  . insulin glargine  15 Units Subcutaneous Daily  . lactulose  20 g Oral Daily  . mouth rinse  15 mL Mouth Rinse BID  . omega-3 acid ethyl esters  1 g Oral QPM  . pantoprazole  40 mg Oral BID AC  . potassium chloride  10 mEq Oral BID  . senna-docusate  2 tablet Oral QHS  . valACYclovir  1,000 mg Oral BID   Continuous: . dextrose 5 % and 0.45 % NaCl with KCl 20 mEq/L 100 mL/hr at 03/16/19 0653  . phenylephrine (NEO-SYNEPHRINE) Adult infusion 20 mcg/min (03/16/19 0653)  . piperacillin-tazobactam (ZOSYN)  IV 12.5 mL/hr at 03/16/19 0653  . vancomycin Stopped (03/15/19 2303)   EVO:[id number], HYDROmorphone (DILAUDID) injection, meclizine, ondansetron (ZOFRAN) IV, oxyCODONE  Assesment: He has pneumonia.  He seems to be improving.  He had significant elevation of heart rate and  hypotension and I believe he was septic.  He is still requiring pressors.  He has previous history of heart failure from cardiomyopathy but apparently his echocardiogram that was done last showed improvement in his systolic function.  Echocardiogram was attempted yesterday but his heart rate was too high to get good images  He has adult failure to thrive  He has hepatocellular cancer and has received radiation treatments for that  He has protein calorie malnutrition multifactorial  He has been having some bloody diarrhea Principal Problem:   Pneumonia- HCAP Vs Aspiration Related with Hypoxia and Hypotension Active Problems:   Essential hypertension   GERD   Constipation   Cardiomyopathy, dilated, nonischemic (HCC)   Cancer, hepatocellular (HCC)   Failure to thrive in adult   Nausea in adult patient   Protein-calorie malnutrition, severe   Dehydration   Orthostatic dizziness   Acute Hypoxic respiratory failure  due to PNA   PSVT (paroxysmal supraventricular tachycardia) (HCC)   Hypotension   Goals of care, counseling/discussion    Plan: Continue IV antibiotics.  Continue pressors as needed no change in treatments for now.  For echo today hopefully    LOS: 5 days   OLIVIA ROYSE 03/16/2019, 7:33 AM
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
## 2019-03-16 NOTE — Progress Notes (Signed)
PT Cancellation Note  Patient Details Name: [name] MRN: [id number] DOB: 1940-12-06   Cancelled Treatment:    Reason Eval/Treat Not Completed: Medical issues which prohibited therapy.  Patient transferred to a higher level of care and will need new PT consult resume therapy when patient is medically stable.  Thank you.   8:27 AM, 03/16/19 Lonell Grandchild, MPT Physical Therapist with [facility] [phone] office [phone] mobile phone

## 2019-03-16 NOTE — Care Management Important Message (Signed)
Important Message  Patient Details  Name: [name] MRN: [id number] Date of Birth: November 02, 1940   Medicare Important Message Given:  Yes     Tommy Medal 03/16/2019, 3:15 PM

## 2019-03-16 NOTE — TOC Progression Note (Signed)
Transition of Care [facility]) - Progression Note    Patient Details  Name: [name] MRN: [id number] Date of Birth: 1940/11/22  [facility] CM/SW Contact  Shade Flood, LCSW Phone Number: 03/16/2019, 11:53 AM  Clinical Narrative:     TOC following. Pt status discussed with MD in Progression today. Per MD, pt had an aspiration event over the weekend. MD anticipating earliest pt would be medically stable for dc is Wed 8/12. Spoke with pt's daughter, [name], by phone to update on SNF bed offers. She and pt's wife would like for pt to go to [facility]. Updated Tami at [facility] and she will start insurance authorization today. TOC will follow.  Expected Discharge Plan: [place] Barriers to Discharge: No Barriers Identified  Expected Discharge Plan and Services Expected Discharge Plan: [place] arrangements for the past 2 months: Single Family Home Expected Discharge Date: 03/13/19                         HH Arranged: RN, PT [place] Agency: [place] ([facility])         Social Determinants of Health (SDOH) Interventions    Readmission Risk Interventions Readmission Risk Prevention Plan 03/13/2019  Transportation Screening Complete  PCP or Specialist Appt within 3-5 Days Complete  HRI or [place] Complete  Social Work Consult for [place] Planning/Counseling Complete  Palliative Care Screening Not Applicable  Medication Review Press photographer) Complete  Some recent data might be hidden

## 2019-03-16 NOTE — Progress Notes (Addendum)
Patient Demographics:    [name], is a 78 y.o. male, DOB - February 08, 1941, TIW:[id number]  Admit date - 03/18/2019   Admitting Physician Albertine Patricia, MD  Outpatient Primary MD for the patient is [name], MD  LOS - 5   Chief Complaint  Patient presents with   Abdominal Pain        Subjective:    [name] today has no fevers, no emesis,  No chest pain,   --Wife at bedside Blood pressure has improved, patient is being weaned off Neo-Synephrine --Oxygen requirement down to 3 L/min    Assessment  & Plan :    Principal Problem:   Pneumonia- HCAP Vs Aspiration Related with Hypoxia and Hypotension Active Problems:   Acute Hypoxic respiratory failure  due to PNA   PSVT (paroxysmal supraventricular tachycardia) (HCC)   Hypotension   Essential hypertension   Constipation   Failure to thrive in adult   Protein-calorie malnutrition, severe   Orthostatic dizziness   GERD   Cardiomyopathy, dilated, nonischemic (HCC)   Cancer, hepatocellular (HCC)   Nausea in adult patient   Dehydration   Goals of care, counseling/discussion  Brief Summary 78 year old male with a history aortic stenosis (s/p Repair, diabetes, hypertension, hyperlipidemia, recent diagnosis of hepatocellular carcinoma status post radiation.  Presented to hospital with complaints of abdominal pain, nausea and vomiting.  He was generally weak and having difficulty ambulating.  Patient was admitted for failure to thrive, orthostasis, persistent nausea and vomiting.  --On 03/14/2019 patient developed persistent tachycardia with heart rate in the 150s to 160s initially and hypotension systolic blood pressure 25K to 80s initially, also became very hypoxic   A/p 1)Bil PNA-- HCAP Vs Aspiration related--hypoxia and tachypnea persist, requiring oxygen supplementation- ---pulmonary consult from Dr. Luan Pulling appreciated --Okay to  stop IV Vanco start today's dose,  -continue IV Zosyn -EKG from 03/15/2019 noted --Given sudden nature of respiratory distress and hypoxia and lack of  fevers or leukocytosis favor r possible aspiration related event rather than frank HCAP -  speech eval appreciated, for MBS on 03/17/19   2)Tachyarrhythmia with hypotension-- --Initial EKG suggested possible SVT, patient did not respond to vagal maneuvers (Valsalva and rectal massage), did not respond to 10 mg of IV Cardizem push, patient did not respond to IV adenosine either -----Case discussed with on-call cardiologist Dr. Tenna Child was able to pull up with serial EKGs and review them-- --PSVT appears to have resolved, patient currently in sinus tachycardia -Echocardiogram with preserved EF of 60 to 65% ---Pressures too soft to allow for Cardizem or metoprolol for rate control, continue digoxin for rate control --Overall blood pressure and heart rate is improved, weaning off Neo-Synephrine  3)Persistent hypotension--currently requiring Neo-Synephrine for pressure support.  Hypotension is multifactorial --On admission patient did have orthostatic hypotension and dizziness in the setting of dehydration --Suspect persistent tachycardia and pneumonia are contributing to his hypotension --Continue IV fluids, EF previously between 60 and 65% repeat echo when heart rate allows --Given orthostatic hypotension on admission and persistent hypotension Solu-Cortef initiated empirically for possible adrenal  related hypotension --Blood pressure has improved, patient is being weaned off Neo-Synephrine  4)Social/Ethics---phone conference and extensive conversations with patient's wife [name]), ,daughter [name]) and son --updated them on change of  condition, questions answered --Patient is a partial code with full scope of treatment - palliative care consult appreciated  5)Hepatocellular Carcinoma--previously completed radiation therapy, patient  follows with Dr. Dorothea Ogle with Protein Caloric Malnutrition/FTT--malignancy related cachexia --continue Marinol for appetite stimulation and nutritional supplements  7)DM2--A1c was 8.6 in January 2020, however patient has lost weight and oral intake has worsened since then anticipate A1c is probably lower now, patient actually developed hypoglycemia, continue dextrose infusion until oral intake improves  8) constipation--CT scan on admission indicated stool burden with possible fecal impaction.  -Improved with laxatives and enemas   9) acute hypoxic respiratory failure due to presumed aspiration pneumonia--treat as above, currently down to 3 L/min of oxygen  10)FEN/dysphagia concerns--- speech pathologist recommends.  Solids and thin liquids, for MBS on 03/17/2019   11)Generalized weakness with failure to thrive.  Secondary to decreased p.o. intake.  Seen by physical therapy with recommendation for skilled nursing facility placement.  He is very weak and having difficulty walking.   ---   Physical Therapist previously recommended SNF -[Not medically ready for discharge at this time)  CRITICAL CARE Performed by: Roxan Hockey   Total critical care time: 35 minutes  Critical care time was exclusive of separately billable procedures and treating other patients.  Critical care was necessary to treat or prevent imminent or life-threatening deterioration.  ---- Condition is critical  - Persistent Hypotension requiring IV pressors -BP improving, weaning off Neo-Synephrine -Persistent Tachycardia and hemodynamic instability  -persistent Hypoxia requiring oxygen supplementation--down to 3 L via nasal cannula  Critical care was time spent personally by me on the following activities: development of treatment plan with patient and/or surrogate as well as nursing, discussions with consultants, evaluation of patient's response to treatment, examination of patient, obtaining  history from patient or surrogate, ordering and performing treatments and interventions, ordering and review of laboratory studies, ordering and review of radiographic studies, pulse oximetry and re-evaluation of patient's condition.  Disposition/Need for in-Hospital Stay- patient unable to be discharged at this time due to persistent hypoxia requiring oxygen supplementation, persistent hypotension requiring IV pressors -Persistent tachycardia and hemodynamic instability   Code Status : Partial Code, but full scope of treatment  Family Communication:   (patient is alert, awake and coherent) --Previously had phone conference and extensive conversations with patient's wife [name]), ,daughter [name]) and son - Further conversations with wife at bedside  Disposition Plan  : SNF  Consults  : Phone consult with cardiology/pulmonary consult/palliative care  DVT Prophylaxis  :   - Heparin - SCDs   Lab Results  Component Value Date   PLT 85 (L) 03/16/2019    Inpatient Medications  Scheduled Meds:  aspirin EC  81 mg Oral Daily   bisacodyl  10 mg Rectal Once   Chlorhexidine Gluconate Cloth  6 each Topical Daily   cholecalciferol  2,000 Units Oral Daily   digoxin  0.125 mg Oral Daily   dronabinol  5 mg Oral BID AC   feeding supplement (ENSURE ENLIVE)  237 mL Oral BID BM   heparin  5,000 Units Subcutaneous Q8H   insulin glargine  15 Units Subcutaneous Daily   lactulose  20 g Oral Daily   mouth rinse  15 mL Mouth Rinse BID   omega-3 acid ethyl esters  1 g Oral QPM   pantoprazole  40 mg Oral BID AC   potassium chloride  10 mEq Oral BID   senna-docusate  2 tablet Oral QHS   valACYclovir  1,000 mg  Oral BID   Continuous Infusions:  dextrose 5 % and 0.45 % NaCl with KCl 20 mEq/L 100 mL/hr at 03/16/19 0653   phenylephrine (NEO-SYNEPHRINE) Adult infusion Stopped (03/16/19 1130)   piperacillin-tazobactam (ZOSYN)  IV 12.5 mL/hr at 03/16/19 0653   vancomycin 500 mg  (03/16/19 1021)   PRN Meds:.guaiFENesin-dextromethorphan, HYDROmorphone (DILAUDID) injection, meclizine, ondansetron (ZOFRAN) IV, oxyCODONE    Anti-infectives (From admission, onward)   Start     Dose/Rate Route Frequency Ordered Stop   03/16/19 1100  vancomycin (VANCOCIN) 500 mg in sodium chloride 0.9 % 100 mL IVPB     500 mg 100 mL/hr over 60 Minutes Intravenous Every 12 hours 03/16/19 0945 03/17/19 1059   03/15/19 2300  vancomycin (VANCOCIN) 500 mg in sodium chloride 0.9 % 100 mL IVPB  Status:  Discontinued     500 mg 100 mL/hr over 60 Minutes Intravenous Every 12 hours 03/15/19 1033 03/16/19 0945   03/15/19 1400  piperacillin-tazobactam (ZOSYN) IVPB 3.375 g     3.375 g 12.5 mL/hr over 240 Minutes Intravenous Every 8 hours 03/15/19 1011     03/15/19 1015  vancomycin (VANCOCIN) IVPB 1000 mg/200 mL premix     1,000 mg 200 mL/hr over 60 Minutes Intravenous  Once 03/15/19 1011 03/15/19 2000   03/11/2019 2200  valACYclovir (VALTREX) tablet 1,000 mg     1,000 mg Oral 2 times daily 03/19/2019 1921          Objective:   Vitals:   03/16/19 1100 03/16/19 1142 03/16/19 1200 03/16/19 1300  BP: 130/67  (!) 83/58 108/62  Pulse: 75 97 84 96  Resp: (!) 23 (!) 22 (!) 22 (!) 25  Temp:  97.6 F (36.4 C)    TempSrc:  Oral    SpO2: 98% 97% 94% 100%  Weight:      Height:        Wt Readings from Last 3 Encounters:  03/16/19 57.1 kg  03/09/19 50.6 kg  02/26/19 53.4 kg     Intake/Output Summary (Last 24 hours) at 03/16/2019 1351 Last data filed at 03/16/2019 0653 Gross per 24 hour  Intake 3413.39 ml  Output 2125 ml  Net 1288.39 ml   Physical Exam Gen:- Awake Alert, able to speak in short sentences,  HEENT:- [place].AT, No sclera icterus Nose- [place] 3L/min Neck-Supple Neck,No JVD,.  Lungs-diminished in bases with bibasilar rhonchi  CV- S1, S2 normal, regular , Sternotomy scar (prior Aortic Valve Repair) abd-  +ve B.Sounds, Abd Soft, No tenderness,    Extremity/Skin:- No  edema, pedal pulses  present  Psych-affect is appropriate, oriented x3 Neuro-generalized weakness, no new focal deficits, no tremors   Data Review:   Micro Results Recent Results (from the past 240 hour(s))  Urine culture     Status: None   Collection Time: 03/22/2019  9:57 AM   Specimen: Urine, Clean Catch  Result Value Ref Range Status   Specimen Description   Final    URINE, CLEAN CATCH Performed at [facility], [address] [place], [place] [address]    Special Requests   Final    NONE Performed at [facility], [address], [place],  [address]    Culture   Final    Multiple bacterial morphotypes present, none predominant. Suggest appropriate recollection if clinically indicated.   Report Status 03/11/2019 FINAL  Final  SARS CORONAVIRUS 2 Nasal Swab Aptima Multi Swab     Status: None   Collection Time: 04/06/2019 11:12 AM   Specimen: Aptima Multi Swab;  Nasal Swab  Result Value Ref Range Status   SARS Coronavirus 2 NEGATIVE NEGATIVE Final    Comment: (NOTE) SARS-CoV-2 target nucleic acids are NOT DETECTED. The SARS-CoV-2 RNA is generally detectable in upper and lower respiratory specimens during the acute phase of infection. Negative results do not preclude SARS-CoV-2 infection, do not rule out co-infections with other pathogens, and should not be used as the sole basis for treatment or other patient management decisions. Negative results must be combined with clinical observations, patient history, and epidemiological information. The expected result is Negative. Fact Sheet for Patients: SugarRoll.be Fact Sheet for Healthcare Providers: https://www.woods-mathews.com/ This test is not yet approved or cleared by the [place] FDA and  has been authorized for detection and/or diagnosis of SARS-CoV-2 by FDA under an Emergency Use Authorization (EUA). This EUA will remain  in effect (meaning this test can be used) for the duration of  the COVID-19 declaration under Section 56 4(b)(1) of the Act, 21 U.S.C. section 360bbb-3(b)(1), unless the authorization is terminated or revoked sooner. Performed at [facility], [place] [address], [place], [place] [address]   MRSA PCR Screening     Status: None   Collection Time: 03/14/19  6:34 PM   Specimen: Nasal Mucosa; Nasopharyngeal  Result Value Ref Range Status   MRSA by PCR NEGATIVE NEGATIVE Final    Comment:        The GeneXpert MRSA Assay (FDA approved for NASAL specimens only), is one component of a comprehensive MRSA colonization surveillance program. It is not intended to diagnose MRSA infection nor to guide or monitor treatment for MRSA infections. Performed at [facility], [address], [place], [place] [address]   Culture, blood (Routine X 2) w Reflex to ID Panel     Status: None (Preliminary result)   Collection Time: 03/15/19 10:57 AM   Specimen: BLOOD RIGHT HAND  Result Value Ref Range Status   Specimen Description   Final    BLOOD RIGHT HAND BOTTLES DRAWN AEROBIC AND ANAEROBIC   Special Requests Blood Culture adequate volume  Final   Culture   Final    NO GROWTH < 24 HOURS Performed at [facility], [address], [place], [place] [address]    Report Status PENDING  Incomplete  Culture, blood (Routine X 2) w Reflex to ID Panel     Status: None (Preliminary result)   Collection Time: 03/15/19 11:08 AM   Specimen: BLOOD RIGHT WRIST  Result Value Ref Range Status   Specimen Description   Final    BLOOD RIGHT WRIST BOTTLES DRAWN AEROBIC AND ANAEROBIC   Special Requests Blood Culture adequate volume  Final   Culture   Final    NO GROWTH < 24 HOURS Performed at [facility], [address], [place], [place] [address]    Report Status PENDING  Incomplete  Expectorated sputum assessment w rflx to resp cult     Status: None   Collection Time: 03/15/19  5:55 PM   Specimen: Expectorated Sputum  Result Value Ref Range Status   Specimen Description  EXPECTORATED SPUTUM  Final   Special Requests Normal  Final   Sputum evaluation   Final    THIS SPECIMEN IS ACCEPTABLE FOR SPUTUM CULTURE Performed at [facility], [address], [place],  [address]    Report Status 03/15/2019 FINAL  Final  Culture, respiratory     Status: None (Preliminary result)   Collection Time: 03/15/19  5:55 PM  Result Value Ref Range Status  Specimen Description   Final    EXPECTORATED SPUTUM Performed at [facility], [address], [place], [place] [address]    Special Requests   Final    Normal Reflexed from [phone] Performed at [facility], [address], [place], [place] [address]    Gram Stain   Final    FEW WBC PRESENT,BOTH PMN AND MONONUCLEAR GRAM POSITIVE COCCI IN PAIRS RARE BUDDING YEAST SEEN    Culture   Final    CULTURE REINCUBATED FOR BETTER GROWTH Performed at [facility], [place] [address], [place],  [address]    Report Status PENDING  Incomplete    Radiology Reports Ct Abdomen Pelvis Wo Contrast  Result Date: 03/08/2019 CLINICAL DATA:  Generalized abdominal pain and nausea for 1 week. EXAM: CT ABDOMEN AND PELVIS WITHOUT CONTRAST TECHNIQUE: Multidetector CT imaging of the abdomen and pelvis was performed following the standard protocol without IV contrast. COMPARISON:  12/09/2018 FINDINGS: Lower chest: There are small bilateral pleural effusions and bibasilar atelectasis. Area of dense subpleural scarring type changes noted in the right lower lobe medially. This has been present since 2015 but appears slightly larger. It measures approximately 2.7 x 2.4 cm and in 2015 measured 18 x 13 mm. Attention on future scans is suggested. Hepatobiliary: Stable nodular contour the liver and vague low-attenuation lesion in the central liver. Gallbladder demonstrates numerous small layering gallstones. No definite findings for acute cholecystitis. No common bile duct dilatation. Pancreas: No mass, inflammation or ductal dilatation.  Moderate pancreatic atrophy. Spleen: Borderline splenomegaly.  No focal lesions. Adrenals/Urinary Tract: Adrenal glands and kidneys are unremarkable. No worrisome renal lesions or renal calculi. The bladder is grossly normal. Stomach/Bowel: The stomach, duodenum, small bowel and colon are grossly normal without oral contrast. No acute inflammatory process, mass lesions or obstructive findings. There is moderate stool in the transverse and descending colon and a large amount of stool in the rectum which could suggest fecal impaction. Vascular/Lymphatic: Advanced atherosclerotic calcifications involving the aorta and iliac arteries and branch vessels. Reproductive: The prostate gland and seminal vesicles are grossly normal. Moderate obscuration by artifact from a left hip prosthesis. Other: No obvious pelvic mass or pelvic lymphadenopathy. No inguinal mass or hernia. Musculoskeletal: Left hip prosthesis with significant artifact. No acute bony findings or destructive bony changes. Advanced facet disease noted in the lumbar spine. IMPRESSION: 1. Exam limited by patient motion and lack of IV and oral contrast. 2. Cholelithiasis but no definite CT findings for acute cholecystitis. 3. Stable cirrhotic changes involving the liver and vague area of low attenuation in the central liver. 4. No obvious acute abdominal/pelvic findings or lymphadenopathy. 5. Moderate stool in the transverse and descending colon and a large amount of stool in the rectum suggesting fecal impaction. 6. Advanced atherosclerotic calcifications throughout the abdomen and pelvis. No focal aneurysm. Electronically Signed   By: Marijo Sanes M.D.   On: 03/08/2019 10:49   Dg Chest 2 View  Result Date: 03/14/2019 CLINICAL DATA:  Generalized abdominal pain and nausea. EXAM: CHEST - 2 VIEW COMPARISON:  Abdominal CT 03/12/2019 and chest radiograph 03/02/2019 FINDINGS: Again noted is blunting at the costophrenic angles compatible with minimal pleural fluid  or scarring based on the recent CT. Postsurgical changes compatible with aortic valve replacement. Stable scarring along the left lung apex. Heart size is within normal limits and stable. No pulmonary edema or significant airspace disease. No acute bone abnormality. IMPRESSION: 1. No acute cardiopulmonary disease. 2. Chronic blunting at the costophrenic angles compatible  scarring and/or minimal pleural fluid based on recent CT. Electronically Signed   By: Markus Daft M.D.   On: 03/16/2019 10:57   Dg Chest 2 View  Result Date: 03/02/2019 CLINICAL DATA:  New onset cough and congestion, history type II diabetes mellitus, hepatocellular cancer, hypertension EXAM: CHEST - 2 VIEW COMPARISON:  02/07/2019 FINDINGS: Normal heart size post median sternotomy and AVR. Mediastinal contours and pulmonary vascularity normal. Atherosclerotic calcification aorta. Chronic interstitial changes with bibasilar scarring. No acute infiltrate, pleural effusion, or pneumothorax. Diffuse osseous demineralization. IMPRESSION: Chronic lung changes without acute abnormality. Electronically Signed   By: Lavonia Dana M.D.   On: 03/02/2019 16:26   Dg Chest Port 1 View  Result Date: 03/16/2019 CLINICAL DATA:  Respiratory failure.  Aortic valve replacement. EXAM: PORTABLE CHEST 1 VIEW COMPARISON:  One-view chest x-ray 03/15/2019 FINDINGS: Heart size normal. Lung volumes are low. Bilateral airspace disease is similar the prior exam, right greater than left. Bilateral pleural effusions are suspected. IMPRESSION: 1. Similar appearance of bilateral airspace disease, right greater left. 2. Question bilateral pleural effusions. Electronically Signed   By: San Morelle M.D.   On: 03/16/2019 09:51   Dg Chest Port 1 View  Result Date: 03/15/2019 CLINICAL DATA:  PICC line placement. EXAM: PORTABLE CHEST 1 VIEW COMPARISON:  March 15, 2019 FINDINGS: Right-sided PICC line terminates at the expected location of the cavoatrial junction.  Cardiomediastinal silhouette is normal. Mediastinal contours appear intact. Calcific atherosclerotic disease of the aorta. There is no evidence of pneumothorax. Patchy bilateral lower lobe airspace opacities, right greater than left. Probable right pleural effusion. Osseous structures are without acute abnormality. Soft tissues are grossly normal. IMPRESSION: 1. Right-sided PICC line terminates at the expected location of the cavoatrial junction. 2. Patchy bilateral lower lobe airspace opacities, right greater than left. 3. Probable right pleural effusion. 4. No evidence of pneumothorax. Electronically Signed   By: Fidela Salisbury M.D.   On: 03/15/2019 15:47   Dg Chest Port 1 View  Result Date: 03/15/2019 CLINICAL DATA:  78 year old male with history of generalized abdominal pain and nausea for 1 week. Dry cough. EXAM: PORTABLE CHEST 1 VIEW COMPARISON:  Chest x-ray 03/12/2019. FINDINGS: Lung volumes have decreased. Patchy multifocal interstitial and airspace disease throughout the mid to lower lungs bilaterally (right greater than left), concerning for developing multilobar pneumonia. No definite pleural effusions. No evidence of pulmonary edema. Heart size is normal. Upper mediastinal contours are within normal limits. Aortic atherosclerosis. Status post median sternotomy for aortic valve replacement (a bioprosthetic valve projects over the expected location of the aortic valve). IMPRESSION: 1. The appearance the chest is concerning for developing multilobar bilateral pneumonia, the appearance of which could be seen with both typical and atypical organisms, including viral etiologies. Further clinical evaluation is recommended. 2. Aortic atherosclerosis. Electronically Signed   By: Vinnie Langton M.D.   On: 03/15/2019 09:13   [place] Ekg Site Rite  Result Date: 03/15/2019 If Site Rite image not attached, placement could not be confirmed due to current cardiac rhythm.    CBC Recent Labs  Lab  03/30/2019 1045 03/11/19 0632 03/12/19 0446 03/15/19 0436 03/15/19 2046 03/16/19 0408  WBC 11.2* 8.7 6.1 8.1  --  5.9  HGB 15.5 13.2 12.2* 11.4* 10.0* 9.7*  HCT 48.1 41.9 38.0* 36.7* 31.1* 30.6*  PLT 219 151 106* 96*  --  85*  MCV 90.8 93.1 92.2 95.1  --  93.0  MCH 29.2 29.3 29.6 29.5  --  29.5  MCHC 32.2 31.5 32.1  31.1  --  31.7  RDW 14.7 14.6 14.2 15.2  --  14.9  LYMPHSABS 0.6*  --   --   --   --   --   MONOABS 0.4  --   --   --   --   --   EOSABS 0.0  --   --   --   --   --   BASOSABS 0.1  --   --   --   --   --     Chemistries  Recent Labs  Lab 03/31/2019 1045 03/16/2019 1114  03/12/19 0446 03/13/19 0549 03/14/19 1554 03/15/19 0436 03/16/19 0408  NA 136  --    < > 134* 133* 135 134* 133*  K 4.2  --    < > 2.8* 3.3* 3.6 4.9 3.8  CL 94*  --    < > 104 103 105 104 104  CO2 29  --    < > 25 23 22 22 25   GLUCOSE 217*  --    < > 98 71 65* 231* 208*  BUN 29*  --    < > 15 11 15 17 13   CREATININE 0.79  --    < > 0.47* 0.54* 0.80 0.64 0.43*  CALCIUM 9.2  --    < > 8.0* 8.0* 7.9* 8.1* 7.6*  MG  --  2.4  --   --   --  1.8  --   --   AST 66*  --   --   --   --   --   --  40  ALT 94*  --   --   --   --   --   --  54*  ALKPHOS 563*  --   --   --   --   --   --  283*  BILITOT 1.9*  --   --   --   --   --   --  1.1   < > = values in this interval not displayed.   ------------------------------------------------------------------------------------------------------------------ No results for input(s): CHOL, HDL, LDLCALC, TRIG, CHOLHDL, LDLDIRECT in the last 72 hours.  Lab Results  Component Value Date   HGBA1C 8.6 (H) 08/19/2018   ------------------------------------------------------------------------------------------------------------------ No results for input(s): TSH, T4TOTAL, T3FREE, THYROIDAB in the last 72 hours.  Invalid input(s): FREET3 ------------------------------------------------------------------------------------------------------------------ No results for  input(s): VITAMINB12, FOLATE, FERRITIN, TIBC, IRON, RETICCTPCT in the last 72 hours.  Coagulation profile No results for input(s): INR, PROTIME in the last 168 hours.  No results for input(s): DDIMER in the last 72 hours.  Cardiac Enzymes No results for input(s): CKMB, TROPONINI, MYOGLOBIN in the last 168 hours.  Invalid input(s): CK ------------------------------------------------------------------------------------------------------------------ No results found for: BNP   Roxan Hockey M.D on 03/16/2019 at 1:51 PM  Go to [email or web address] - for contact info  [facility] - Office  [phone]

## 2019-03-16 NOTE — Consult Note (Signed)
Consultation Note Date: 03/16/2019   Patient Name: [name]  DOB: May 04, 1941  MRN: [id number]  Age / Sex: 78 y.o., male  PCP: [name], MD Referring Physician: Roxan Hockey, MD  Reason for Consultation: Establishing goals of care  HPI/Patient Profile: 78 y.o. male  with past medical history of hepatocellular carcinoma s/p radiation, aortic stenosis s/p repair, DM, HTN, HLD CAD admitted on 04/02/2019 with abdominal pain, n/v, weakness. Patient admitted for failure to thrive. CT abdomen negative for acute findings.  On 8/8, patient developed persistent tachycardia, hypotension and hypoxia requiring transfer to ICU for vasopressor support and HFNC. Bilateral HCAP vs. Aspiration pneumonia. Antibiotics initiated. SLP evaluated and planning for MBS on 8/11. Palliative medicine consultation for goals of care.   Clinical Assessment and Goals of Care:  I have reviewed medical records, discussed with Dr. Denton Brick and RN and met with patient and wife [name]) at bedside to discuss [place]. Patient is awake, alert, and oriented. He is able to participate in [place] discussion. He appears very weak and deconditioned. He takes a few sips of water and begins coughing afterwards. SLP evaluated this AM and planning for MBS tomorrow, 8/11. Patient denies pain or dyspnea. He reports poor appetite/taste for foods and nausea.   Introduced Palliative Medicine as specialized medical care for people living with serious illness. It focuses on providing relief from the symptoms and stress of a serious illness. The goal is to improve quality of life for both the patient and the family.  We discussed a brief life review of the patient. [name] shares that they have been married for 5 years and have two sons and one daughter. Prior to diagnosis with cancer in May, [name] reports he was independent and doing well. Since the diagnosis of liver  cancer, he has been followed by Dr Raliegh Ip and completed radiation. He has lost a significant amount of weight.   Oncology notes reviewed. Explored patient/family understanding and expectations of cancer diagnosis. Wife reports he completed 10 cycles of radiation and patient and family made the decision that he was too weak for chemotherapy. Wife acknowledges that the "tumor has shrunk but still there." She speaks of plan to follow up with Dr. Raliegh Ip outpatient for repeat scans.   Discussed course of hospitalization including diagnoses and interventions. Discussed SLP recommendations and plan. Discussed poor functional and nutritional status. Wife asks about feeding tube and shares that her and daughter agree with feeding tube if necessary.   I attempted to elicit values and goals of care important to the patient and wife. Advanced directives, concepts specific to code status, artifical feeding and hydration were discussed. Patient does NOT have a documented HCPOA or living will but interested in completing this admission. Chaplain consult placed. Also encouraged wife to discuss with children about hiring an attorney for financial/durable POA.   Introduced and completed MOST form with patient and wife. Further explored patients thoughts on feeding tube placement. He speaks of 'rather not having one' but if 'absolutely necessary and if it would help'  he would opt for PEG tube placement. We discussed risks/benefits of PEG tube placement and continuing dysphagia diet. Patient confirms his decision for DNR/no compressions in the event of cardiac arrest. He wishes for short-term intubation/mechanical ventilation in the event of respiratory distress. He would want cardioversion/defibrillation. After further discussion, it is evident that he would NOT want prolonged, heroic interventions if meaningful quality of life is not possible.   Completed MOST form with patient and wife. No compression, FULL scope treatment including  intubation/mechanical ventilation and shock, ABX/IVF if indicated, and feeding tube for a time trial. Durable DNR completed but patient made LIMITED code inpatient. Discussed with patient and family, and they understand limited code status. MOST and durable DNR copies made for chart and family.   Discussed plan moving forward including physical therapy and potential SNF for rehab. Patient willing to pursue SNF placement with hopes to regain strength and return home when possible. Wife agrees and is also hopeful he will regain strength at SNF.   Questions and concerns were addressed.  Hard Choices booklet left for review. PMT contact information given.     SUMMARY OF RECOMMENDATIONS    LIMITED CODE: No compressions in the event of cardiac arrest. Patient does wish for short-term intubation/mechanical ventilation if respiratory arrest/distress occur.  MOST form completed with patient/wife: No compressions, full scope treatment, IVF/ABX if indicated, and PEG tube placement for time trial.   Continue current plan of care and medical management.   SLP following and plan for MBS tomorrow, 8/11.  PT pending. Patient is very weak and deconditioned. Will likely need SNF to attempt rehab.   Patient/wife plan to continue follow-up with outpatient oncologist.   May benefit from outpatient palliative referral. Patient high risk for recurrent admission with underlying cancer, poor nutritional status, and significant deconditioning.   Code Status/Advance Care Planning:  Limited code  Symptom Management:   Per attending  Palliative Prophylaxis:   Aspiration, Delirium Protocol, Frequent Pain Assessment, Oral Care and Turn Reposition  Additional Recommendations (Limitations, Scope, Preferences):  Full Scope Treatment  Psycho-social/Spiritual:   Desire for further Chaplaincy support:yes  Additional Recommendations: Caregiving  Support/Resources, Compassionate Wean Education and Education on  Hospice  Prognosis:   Guarded   Discharge Planning: To Be Determined: likely SNF for rehab      Primary Diagnoses: Present on Admission:  Failure to thrive in adult  Nausea in adult patient  Cardiomyopathy, dilated, nonischemic (HCC)  Constipation  Essential hypertension  GERD  Protein-calorie malnutrition, severe  Cancer, hepatocellular (HCC)  Dehydration  Hypotension   I have reviewed the medical record, interviewed the patient and family, and examined the patient. The following aspects are pertinent.  Past Medical History:  Diagnosis Date   Aortic stenosis, mild    Arthritis    BPH (benign prostatic hypertrophy)    CAD (coronary artery disease)    Cancer (HCC)    spindle cell cancer of left ear, hepatocellular   Constipation    Diabetes mellitus    Type II   Fracture, shoulder    left   GERD (gastroesophageal reflux disease)    History of cataract    History of kidney stones    passed   Hyperlipidemia    Hypertension    Nephrolithiasis    Pancreatitis    acute   Vitamin D deficiency    Social History   Socioeconomic History   Marital status: Married    Spouse name: Not on file   Number of children: Not on  file   Years of education: Not on file   Highest education level: Not on file  Occupational History   Not on file  Social Needs   Financial resource strain: Patient refused   Food insecurity    Worry: Patient refused    Inability: Patient refused   Transportation needs    Medical: No    Non-medical: No  Tobacco Use   Smoking status: Former Smoker    Packs/day: 1.00    Years: 30.00    Pack years: 30.00    Quit date: 06/10/1999    Years since quitting: 19.7   Smokeless tobacco: Former Systems developer    Types: Chew    Quit date: 04/09/2014   Tobacco comment: Quit >15 years ago  Substance and Sexual Activity   Alcohol use: No    Alcohol/week: 0.0 standard drinks   Drug use: No   Sexual activity: Not  Currently  Lifestyle   Physical activity    Days per week: Patient refused    Minutes per session: Patient refused   Stress: Patient refused  Relationships   Social connections    Talks on phone: Patient refused    Gets together: Patient refused    Attends religious service: Patient refused    Active member of club or organization: Patient refused    Attends meetings of clubs or organizations: Patient refused    Relationship status: Patient refused  Other Topics Concern   Not on file  Social History Narrative   Married for >44 years   Family History  Problem Relation Age of Onset   Heart disease Mother    Congestive Heart Failure Father    Heart failure Father    Heart failure Sister 35   Scheduled Meds:  aspirin EC  81 mg Oral Daily   bisacodyl  10 mg Rectal Once   Chlorhexidine Gluconate Cloth  6 each Topical Daily   cholecalciferol  2,000 Units Oral Daily   digoxin  0.125 mg Oral Daily   dronabinol  5 mg Oral BID AC   feeding supplement (ENSURE ENLIVE)  237 mL Oral BID BM   heparin  5,000 Units Subcutaneous Q8H   insulin glargine  15 Units Subcutaneous Daily   lactulose  20 g Oral Daily   mouth rinse  15 mL Mouth Rinse BID   omega-3 acid ethyl esters  1 g Oral QPM   pantoprazole  40 mg Oral BID AC   potassium chloride  10 mEq Oral BID   senna-docusate  2 tablet Oral QHS   valACYclovir  1,000 mg Oral BID   Continuous Infusions:  dextrose 5 % and 0.45 % NaCl with KCl 20 mEq/L 100 mL/hr at 03/16/19 1515   phenylephrine (NEO-SYNEPHRINE) Adult infusion Stopped (03/16/19 1130)   piperacillin-tazobactam (ZOSYN)  IV 3.375 g (03/16/19 1517)   vancomycin 500 mg (03/16/19 1021)   PRN Meds:.guaiFENesin-dextromethorphan, HYDROmorphone (DILAUDID) injection, meclizine, ondansetron (ZOFRAN) IV, oxyCODONE Medications Prior to Admission:  Prior to Admission medications   Medication Sig Start Date End Date Taking? Authorizing Provider  aspirin 81 MG  EC tablet Take 1 tablet (81 mg total) by mouth daily. Swallow whole. 08/19/17  Yes Orlena Sheldon, PA-C  Cholecalciferol (VITAMIN D) 2000 units tablet Take 1 tablet (2,000 Units total) by mouth daily. 08/19/17  Yes Dena Billet B, PA-C  fish oil-omega-3 fatty acids 1000 MG capsule Take 1 g by mouth every evening.    Yes [provider]  meclizine (ANTIVERT) 12.5 MG tablet Take 1  tablet (12.5 mg total) by mouth 3 (three) times daily as needed for dizziness. 06/22/18  Yes Virgel Manifold, MD  metoprolol succinate (TOPROL-XL) 50 MG 24 hr tablet TAKE 1 TABLET DAILY WITH ORIMMEDIATELY FOLLOWING A    MEAL 09/29/18  Yes Susy Frizzle, MD  omeprazole (PRILOSEC) 20 MG capsule Take 1 capsule (20 mg total) by mouth daily. 09/29/18  Yes Susy Frizzle, MD  ondansetron (ZOFRAN) 4 MG tablet TAKE 1 TABLET BY MOUTH EVERY 8 HOURS AS NEEDED FOR NAUSEA AND VOMITING 03/09/19  Yes Lockamy, Randi L, NP-C  polyethylene glycol powder (GLYCOLAX/MIRALAX) 17 GM/SCOOP powder Take 17 g by mouth daily. 12/18/18  Yes Rehman, Mechele Dawley, MD  traMADol (ULTRAM) 50 MG tablet Take 50 mg by mouth every 6 (six) hours as needed for moderate pain or severe pain.    Yes [provider]  aspirin EC 81 MG EC tablet Take 1 tablet (81 mg total) by mouth daily with breakfast. 03/13/19   Roxan Hockey, MD  dronabinol (MARINOL) 5 MG capsule Take 1 capsule (5 mg total) by mouth 2 (two) times daily before a meal. 03/13/19   Emokpae, Courage, MD  Insulin Glargine (BASAGLAR KWIKPEN) 100 UNIT/ML SOPN Inject 0.12 mLs (12 Units total) into the skin daily. 03/13/19   Roxan Hockey, MD  Lactulose 20 GM/30ML SOLN Take 30 mLs (20 g total) by mouth at bedtime. Patient not taking: Reported on 02/26/2019 12/26/18   Derek Jack, MD  oxyCODONE (ROXICODONE) 5 MG immediate release tablet Take 1 tablet (5 mg total) by mouth every 4 (four) hours as needed for severe pain. Patient not taking: Reported on 02/26/2019 01/20/19   Susy Frizzle, MD    pantoprazole (PROTONIX) 40 MG tablet Take 1 tablet (40 mg total) by mouth daily. 03/13/19   Roxan Hockey, MD  potassium chloride (K-DUR) 10 MEQ tablet Take 1 tablet (10 mEq total) by mouth 2 (two) times daily. 03/13/19   Roxan Hockey, MD  senna-docusate (SENOKOT-S) 8.6-50 MG tablet Take 2 tablets by mouth at bedtime. 03/13/19   Roxan Hockey, MD  valACYclovir (VALTREX) 1000 MG tablet Take 1 tablet (1,000 mg total) by mouth 2 (two) times daily. Patient not taking: Reported on 03/09/2019 02/26/19   Derek Jack, MD  ondansetron (ZOFRAN) 4 MG tablet TAKE 1 TABLET BY MOUTH EVERY 8 HOURS AS NEEDED FOR NAUSEA AND VOMITING 02/27/19   Lockamy, Randi L, NP-C   Allergies  Allergen Reactions   Actos [Pioglitazone] Other (See Comments)    Cramps in legs    Metformin And Related Diarrhea   Morphine Hives   Statins     REACTION: leg cramps but tolerates pravastatin   Review of Systems  Constitutional: Positive for activity change, appetite change and unexpected weight change.  Respiratory: Positive for shortness of breath.   Gastrointestinal: Positive for nausea.    Physical Exam Vitals signs and nursing note reviewed.  Constitutional:      General: He is awake.     Appearance: He is cachectic. He is ill-appearing.  Cardiovascular:     Rate and Rhythm: Normal rate.  Pulmonary:     Effort: No tachypnea, accessory muscle usage or respiratory distress.     Comments: 3L, intermittent episodes of dyspnea Skin:    General: Skin is warm and dry.     Coloration: Skin is pale.  Neurological:     Mental Status: He is alert and oriented to person, place, and time.  Psychiatric:        Mood  and Affect: Mood normal.        Speech: Speech normal.        Behavior: Behavior normal.        Cognition and Memory: Cognition normal.    Vital Signs: BP (!) 99/50    Pulse 89    Temp 97.6 F (36.4 C) (Oral)    Resp (!) 23    Ht 5' 8"  (1.727 m)    Wt 57.1 kg    SpO2 100%    BMI 19.14 kg/m   Pain Scale: 0-10   Pain Score: 2    SpO2: SpO2: 100 % O2 Device:SpO2: 100 % O2 Flow Rate: .O2 Flow Rate (L/min): 3 L/min  IO: Intake/output summary:   Intake/Output Summary (Last 24 hours) at 03/16/2019 1602 Last data filed at 03/16/2019 1548 Gross per 24 hour  Intake 3134.25 ml  Output 2125 ml  Net 1009.25 ml    LBM: Last BM Date: 03/15/19 Baseline Weight: Weight: 50.3 kg Most recent weight: Weight: 57.1 kg     Palliative Assessment/Data: PPS 40%   Flowsheet Rows     Most Recent Value  Intake Tab  Referral Department  Hospitalist  Unit at Time of Referral  ICU  Palliative Care Primary Diagnosis  Cancer  Palliative Care Type  New Palliative care  Reason for referral  Clarify Goals of Care  Date first seen by Palliative Care  03/16/19  Clinical Assessment  Palliative Performance Scale Score  40%  Psychosocial & Spiritual Assessment  Palliative Care Outcomes  Patient/Family meeting held?  Yes  Who was at the meeting?  patient and wife  Palliative Care Outcomes  Clarified goals of care, Provided end of life care assistance, Provided advance care planning, Provided psychosocial or spiritual support, ACP counseling assistance, Completed durable DNR, Changed CPR status      Time In/Out:1030-1050, 1230-1330 Time Total: 80 Greater than 50%  of this time was spent counseling and coordinating care related to the above assessment and plan.  Signed by:  Ihor Dow, DNP, FNP-C Palliative Medicine Team  Phone: [phone] Fax: [phone]   Please contact Palliative Medicine Team phone at [phone] for questions and concerns.  For individual provider: See Shea Evans

## 2019-03-17 ENCOUNTER — Inpatient Hospital Stay (HOSPITAL_COMMUNITY): Payer: Medicare HMO

## 2019-03-17 ENCOUNTER — Inpatient Hospital Stay: Payer: Medicare HMO | Admitting: Family Medicine

## 2019-03-17 LAB — CBC
HCT: 25.7 % — ABNORMAL LOW (ref 39.0–52.0)
Hemoglobin: 8.1 g/dL — ABNORMAL LOW (ref 13.0–17.0)
MCH: 29.2 pg (ref 26.0–34.0)
MCHC: 31.5 g/dL (ref 30.0–36.0)
MCV: 92.8 fL (ref 80.0–100.0)
Platelets: 55 10*3/uL — ABNORMAL LOW (ref 150–400)
RBC: 2.77 MIL/uL — ABNORMAL LOW (ref 4.22–5.81)
RDW: 15.1 % (ref 11.5–15.5)
WBC: 3.3 10*3/uL — ABNORMAL LOW (ref 4.0–10.5)
nRBC: 0 % (ref 0.0–0.2)

## 2019-03-17 LAB — CULTURE, RESPIRATORY W GRAM STAIN
Culture: NORMAL
Special Requests: NORMAL

## 2019-03-17 MED ORDER — ALBUTEROL SULFATE (2.5 MG/3ML) 0.083% IN NEBU
2.5000 mg | INHALATION_SOLUTION | Freq: Three times a day (TID) | RESPIRATORY_TRACT | Status: DC
  Administered 2019-03-17 – 2019-03-23 (×18): 2.5 mg via RESPIRATORY_TRACT
  Filled 2019-03-17 (×18): qty 3

## 2019-03-17 MED ORDER — SENNOSIDES-DOCUSATE SODIUM 8.6-50 MG PO TABS: 2.0000 | ORAL_TABLET | Freq: Two times a day (BID) | ORAL | Status: DC

## 2019-03-17 NOTE — Progress Notes (Signed)
Subjective: He says he feels better.  He is still short of breath with minimal exertion.  He is coughing a little bit.  He had evaluation by speech yesterday and plan is for him to have modified barium swallow today.  Objective: Vital signs in last 24 hours: Temp:  [97.3 F (36.3 C)-97.7 F (36.5 C)] 97.3 F (36.3 C) (08/11 0745) Pulse Rate:  [75-106] 84 (08/11 0745) Resp:  [17-32] 23 (08/11 0745) BP: (83-130)/(48-67) 106/64 (08/11 0700) SpO2:  [89 %-100 %] 96 % (08/11 0745) Weight:  [59.2 kg] 59.2 kg (08/11 0458) Weight change: 2.1 kg Last BM Date: 03/16/19  Intake/Output from previous day: 08/10 0701 - 08/11 0700 In: 2932.4 [P.O.:360; I.V.:2176; IV Piggyback:396.4] Out: 1600 [Urine:1600]  PHYSICAL EXAM General appearance: alert, cooperative, cachectic and mild distress Resp: rhonchi bilaterally Cardio: regular rate and rhythm, S1, S2 normal, no murmur, click, rub or gallop GI: soft, non-tender; bowel sounds normal; no masses,  no organomegaly Extremities: extremities normal, atraumatic, no cyanosis or edema  Lab Results:  Results for orders placed or performed during the hospital encounter of 04-04-2019 (from the past 48 hour(s))  Culture, blood (Routine X 2) w Reflex to ID Panel     Status: None (Preliminary result)   Collection Time: 03/15/19 10:57 AM   Specimen: BLOOD RIGHT HAND  Result Value Ref Range   Specimen Description      BLOOD RIGHT HAND BOTTLES DRAWN AEROBIC AND ANAEROBIC   Special Requests Blood Culture adequate volume    Culture      NO GROWTH 2 DAYS Performed at [facility], [address] [place], [place] [address]    Report Status PENDING   Culture, blood (Routine X 2) w Reflex to ID Panel     Status: None (Preliminary result)   Collection Time: 03/15/19 11:08 AM   Specimen: BLOOD RIGHT WRIST  Result Value Ref Range   Specimen Description      BLOOD RIGHT WRIST BOTTLES DRAWN AEROBIC AND ANAEROBIC   Special Requests Blood Culture adequate volume     Culture      NO GROWTH 2 DAYS Performed at [facility], [address] [place], [place] [address]    Report Status PENDING   Occult blood card to lab, stool RN will collect     Status: Abnormal   Collection Time: 03/15/19  4:02 PM  Result Value Ref Range   Fecal Occult Bld POSITIVE (A) NEGATIVE    Comment: Performed at [facility], [address] [place], [place] [address]  Expectorated sputum assessment w rflx to resp cult     Status: None   Collection Time: 03/15/19  5:55 PM   Specimen: Expectorated Sputum  Result Value Ref Range   Specimen Description EXPECTORATED SPUTUM    Special Requests Normal    Sputum evaluation      THIS SPECIMEN IS ACCEPTABLE FOR SPUTUM CULTURE Performed at [facility], [address] [place], [place] [address]    Report Status 03/15/2019 FINAL   Culture, respiratory     Status: None (Preliminary result)   Collection Time: 03/15/19  5:55 PM  Result Value Ref Range   Specimen Description      EXPECTORATED SPUTUM Performed at [facility], [address] [place], [place] [address]    Special Requests      Normal Reflexed from [id number] Performed at [facility], [address], [place], [place] [address]    Gram Stain      FEW WBC PRESENT,BOTH PMN AND  MONONUCLEAR GRAM POSITIVE COCCI IN PAIRS RARE BUDDING YEAST SEEN    Culture      CULTURE REINCUBATED FOR BETTER GROWTH Performed at [facility], [address] [place], [place] [address]    Report Status PENDING   Hemoglobin and hematocrit, blood     Status: Abnormal   Collection Time: 03/15/19  8:46 PM  Result Value Ref Range   Hemoglobin 10.0 (L) 13.0 - 17.0 g/dL   HCT 57.8 (L) 46.9 - 62.9 %    Comment: Performed at [facility], [address] [place], [place] [address]  CBC     Status: Abnormal   Collection Time: 03/16/19  4:08 AM  Result Value Ref Range   WBC 5.9 4.0 - 10.5 K/uL   RBC 3.29 (L) 4.22 - 5.81 MIL/uL   Hemoglobin 9.7 (L) 13.0 - 17.0 g/dL   HCT 32.4 (L) 40.1 - 02.7 %    MCV 93.0 80.0 - 100.0 fL   MCH 29.5 26.0 - 34.0 pg   MCHC 31.7 30.0 - 36.0 g/dL   RDW 25.3 66.4 - 40.3 %   Platelets 85 (L) 150 - 400 K/uL    Comment: REPEATED TO VERIFY PLATELET COUNT CONFIRMED BY SMEAR SPECIMEN CHECKED FOR CLOTS Immature Platelet Fraction may be clinically indicated, consider ordering this additional test [id number]    nRBC 0.0 0.0 - 0.2 %    Comment: Performed at [facility], [address] [place], [place] [address]  Comprehensive metabolic panel     Status: Abnormal   Collection Time: 03/16/19  4:08 AM  Result Value Ref Range   Sodium 133 (L) 135 - 145 mmol/L   Potassium 3.8 3.5 - 5.1 mmol/L    Comment: DELTA CHECK NOTED   Chloride 104 98 - 111 mmol/L   CO2 25 22 - 32 mmol/L   Glucose, Bld 208 (H) 70 - 99 mg/dL   BUN 13 8 - 23 mg/dL   Creatinine, Ser 4.33 (L) 0.61 - 1.24 mg/dL   Calcium 7.6 (L) 8.9 - 10.3 mg/dL   Total Protein 4.4 (L) 6.5 - 8.1 g/dL   Albumin 1.8 (L) 3.5 - 5.0 g/dL   AST 40 15 - 41 U/L   ALT 54 (H) 0 - 44 U/L   Alkaline Phosphatase 283 (H) 38 - 126 U/L   Total Bilirubin 1.1 0.3 - 1.2 mg/dL   GFR calc non Af Amer >60 >60 mL/min   GFR calc Af Amer >60 >60 mL/min   Anion gap 4 (L) 5 - 15    Comment: Performed at [facility], [address], [place], [place] [address]    ABGS No results for input(s): PHART, PO2ART, TCO2, HCO3 in the last 72 hours.  Invalid input(s): PCO2 CULTURES Recent Results (from the past 240 hour(s))  Urine culture     Status: None   Collection Time: 03/27/2019  9:57 AM   Specimen: Urine, Clean Catch  Result Value Ref Range Status   Specimen Description   Final    URINE, CLEAN CATCH Performed at [facility], [address], [place], [place] [address]    Special Requests   Final    NONE Performed at [facility], [address], [place], [place] [address]    Culture   Final    Multiple bacterial morphotypes present, none predominant. Suggest appropriate recollection if clinically indicated.   Report  Status 03/11/2019 FINAL  Final  SARS CORONAVIRUS 2 Nasal Swab Aptima Multi Swab     Status:  None   Collection Time: 03/28/2019 11:12 AM   Specimen: Aptima Multi Swab; Nasal Swab  Result Value Ref Range Status   SARS Coronavirus 2 NEGATIVE NEGATIVE Final    Comment: (NOTE) SARS-CoV-2 target nucleic acids are NOT DETECTED. The SARS-CoV-2 RNA is generally detectable in upper and lower respiratory specimens during the acute phase of infection. Negative results do not preclude SARS-CoV-2 infection, do not rule out co-infections with other pathogens, and should not be used as the sole basis for treatment or other patient management decisions. Negative results must be combined with clinical observations, patient history, and epidemiological information. The expected result is Negative. Fact Sheet for Patients: HairSlick.no Fact Sheet for Healthcare Providers: quierodirigir.com This test is not yet approved or cleared by the [place] FDA and  has been authorized for detection and/or diagnosis of SARS-CoV-2 by FDA under an Emergency Use Authorization (EUA). This EUA will remain  in effect (meaning this test can be used) for the duration of the COVID-19 declaration under Section 56 4(b)(1) of the Act, 21 U.S.C. section 360bbb-3(b)(1), unless the authorization is terminated or revoked sooner. Performed at [facility], [address], [place], [place] [address]   MRSA PCR Screening     Status: None   Collection Time: 03/14/19  6:34 PM   Specimen: Nasal Mucosa; Nasopharyngeal  Result Value Ref Range Status   MRSA by PCR NEGATIVE NEGATIVE Final    Comment:        The GeneXpert MRSA Assay (FDA approved for NASAL specimens only), is one component of a comprehensive MRSA colonization surveillance program. It is not intended to diagnose MRSA infection nor to guide or monitor treatment for MRSA infections. Performed at [facility], [address], [place], [place] [address]   Culture, blood (Routine X 2) w Reflex to ID Panel     Status: None (Preliminary result)   Collection Time: 03/15/19 10:57 AM   Specimen: BLOOD RIGHT HAND  Result Value Ref Range Status   Specimen Description   Final    BLOOD RIGHT HAND BOTTLES DRAWN AEROBIC AND ANAEROBIC   Special Requests Blood Culture adequate volume  Final   Culture   Final    NO GROWTH 2 DAYS Performed at [facility], [address], [place], [place] [address]    Report Status PENDING  Incomplete  Culture, blood (Routine X 2) w Reflex to ID Panel     Status: None (Preliminary result)   Collection Time: 03/15/19 11:08 AM   Specimen: BLOOD RIGHT WRIST  Result Value Ref Range Status   Specimen Description   Final    BLOOD RIGHT WRIST BOTTLES DRAWN AEROBIC AND ANAEROBIC   Special Requests Blood Culture adequate volume  Final   Culture   Final    NO GROWTH 2 DAYS Performed at [facility], [address] [place], [place] [address]    Report Status PENDING  Incomplete  Expectorated sputum assessment w rflx to resp cult     Status: None   Collection Time: 03/15/19  5:55 PM   Specimen: Expectorated Sputum  Result Value Ref Range Status   Specimen Description EXPECTORATED SPUTUM  Final   Special Requests Normal  Final   Sputum evaluation   Final    THIS SPECIMEN IS ACCEPTABLE FOR SPUTUM CULTURE Performed at [facility], [address], [place], [place] [address]    Report Status 03/15/2019 FINAL  Final  Culture, respiratory     Status: None (Preliminary result)   Collection  Time: 03/15/19  5:55 PM  Result Value Ref Range Status   Specimen Description   Final    EXPECTORATED SPUTUM Performed at [facility], [address], [place], [place] [address]    Special Requests   Final    Normal Reflexed from [id number] Performed at [facility], [address], [place], [place] [address]    Gram Stain   Final    FEW WBC PRESENT,BOTH PMN AND MONONUCLEAR GRAM POSITIVE  COCCI IN PAIRS RARE BUDDING YEAST SEEN    Culture   Final    CULTURE REINCUBATED FOR BETTER GROWTH Performed at [facility], [address] [place], [place] [address]    Report Status PENDING  Incomplete   Studies/Results: Dg Chest Port 1 View  Result Date: 03/16/2019 CLINICAL DATA:  Respiratory failure.  Aortic valve replacement. EXAM: PORTABLE CHEST 1 VIEW COMPARISON:  One-view chest x-ray 03/15/2019 FINDINGS: Heart size normal. Lung volumes are low. Bilateral airspace disease is similar the prior exam, right greater than left. Bilateral pleural effusions are suspected. IMPRESSION: 1. Similar appearance of bilateral airspace disease, right greater left. 2. Question bilateral pleural effusions. Electronically Signed   By: Marin Roberts M.D.   On: 03/16/2019 09:51   Dg Chest Port 1 View  Result Date: 03/15/2019 CLINICAL DATA:  PICC line placement. EXAM: PORTABLE CHEST 1 VIEW COMPARISON:  March 15, 2019 FINDINGS: Right-sided PICC line terminates at the expected location of the cavoatrial junction. Cardiomediastinal silhouette is normal. Mediastinal contours appear intact. Calcific atherosclerotic disease of the aorta. There is no evidence of pneumothorax. Patchy bilateral lower lobe airspace opacities, right greater than left. Probable right pleural effusion. Osseous structures are without acute abnormality. Soft tissues are grossly normal. IMPRESSION: 1. Right-sided PICC line terminates at the expected location of the cavoatrial junction. 2. Patchy bilateral lower lobe airspace opacities, right greater than left. 3. Probable right pleural effusion. 4. No evidence of pneumothorax. Electronically Signed   By: Ted Mcalpine M.D.   On: 03/15/2019 15:47   [place] Ekg Site Rite  Result Date: 03/15/2019 If Site Rite image not attached, placement could not be confirmed due to current cardiac rhythm.   Medications:  Prior to Admission:  Medications Prior to Admission  Medication Sig  Dispense Refill Last Dose  . aspirin 81 MG EC tablet Take 1 tablet (81 mg total) by mouth daily. Swallow whole. 90 tablet 1 Past Week at Unknown time  . Cholecalciferol (VITAMIN D) 2000 units tablet Take 1 tablet (2,000 Units total) by mouth daily. 90 tablet 1 Past Week at Unknown time  . fish oil-omega-3 fatty acids 1000 MG capsule Take 1 g by mouth every evening.    Past Week at Unknown time  . meclizine (ANTIVERT) 12.5 MG tablet Take 1 tablet (12.5 mg total) by mouth 3 (three) times daily as needed for dizziness. 20 tablet 0   . metoprolol succinate (TOPROL-XL) 50 MG 24 hr tablet TAKE 1 TABLET DAILY WITH ORIMMEDIATELY FOLLOWING A    MEAL 90 tablet 1 Past Week at Unknown time  . omeprazole (PRILOSEC) 20 MG capsule Take 1 capsule (20 mg total) by mouth daily. 90 capsule 1 Past Week at Unknown time  . ondansetron (ZOFRAN) 4 MG tablet TAKE 1 TABLET BY MOUTH EVERY 8 HOURS AS NEEDED FOR NAUSEA AND VOMITING 30 tablet 0 Past Week at Unknown time  . polyethylene glycol powder (GLYCOLAX/MIRALAX) 17 GM/SCOOP powder Take 17 g by mouth daily. 507 g 3   . traMADol (ULTRAM) 50 MG  tablet Take 50 mg by mouth every 6 (six) hours as needed for moderate pain or severe pain.      . [DISCONTINUED] dronabinol (MARINOL) 2.5 MG capsule Take 1 capsule (2.5 mg total) by mouth 2 (two) times daily before a meal. 60 capsule 0 Past Week at Unknown time  . [DISCONTINUED] potassium chloride (K-DUR) 10 MEQ tablet Take 1 tablet (10 mEq total) by mouth 2 (two) times daily. 60 tablet 2 Past Week at Unknown time  . Lactulose 20 GM/30ML SOLN Take 30 mLs (20 g total) by mouth at bedtime. (Patient not taking: Reported on 02/26/2019) 450 mL 1 Not Taking at Unknown time  . oxyCODONE (ROXICODONE) 5 MG immediate release tablet Take 1 tablet (5 mg total) by mouth every 4 (four) hours as needed for severe pain. (Patient not taking: Reported on 02/26/2019) 30 tablet 0 Not Taking at Unknown time  . valACYclovir (VALTREX) 1000 MG tablet Take 1 tablet  (1,000 mg total) by mouth 2 (two) times daily. (Patient not taking: Reported on 03/09/2019) 14 tablet 0 Not Taking at Unknown time  . [DISCONTINUED] Insulin Glargine (BASAGLAR KWIKPEN) 100 UNIT/ML SOPN Inject 0.36 mLs (36 Units total) into the skin daily. Give number of units as directed at office visit, up to 30 units daily. (Patient not taking: Reported on 02/26/2019) 5 pen 3 Not Taking at Unknown time   Scheduled: . aspirin EC  81 mg Oral Daily  . bisacodyl  10 mg Rectal Once  . Chlorhexidine Gluconate Cloth  6 each Topical Daily  . cholecalciferol  2,000 Units Oral Daily  . digoxin  0.125 mg Oral Daily  . dronabinol  5 mg Oral BID AC  . feeding supplement (ENSURE ENLIVE)  237 mL Oral BID BM  . heparin  5,000 Units Subcutaneous Q8H  . insulin glargine  15 Units Subcutaneous Daily  . lactulose  20 g Oral Daily  . mouth rinse  15 mL Mouth Rinse BID  . omega-3 acid ethyl esters  1 g Oral QPM  . pantoprazole  40 mg Oral BID AC  . potassium chloride  10 mEq Oral BID  . senna-docusate  2 tablet Oral QHS  . valACYclovir  1,000 mg Oral BID   Continuous: . dextrose 5 % and 0.45 % NaCl with KCl 20 mEq/L 100 mL/hr at 03/17/19 0613  . phenylephrine (NEO-SYNEPHRINE) Adult infusion Stopped (03/16/19 1130)  . piperacillin-tazobactam (ZOSYN)  IV 12.5 mL/hr at 03/17/19 5284   XLK:[id number], HYDROmorphone (DILAUDID) injection, meclizine, ondansetron (ZOFRAN) IV, oxyCODONE  Assesment: He was admitted with failure to thrive and was being prepared for transfer to skilled care facility when he had sudden deterioration with increased heart rate and increased respiratory effort.  He was found to have bilateral pneumonia.  This is probably from aspiration.  He had initial speech evaluation yesterday and looks like he is at least moderate aspiration risk and is going to have further evaluation today.  He had supraventricular tachycardia likely related to his acute deterioration.  He has  severe protein calorie malnutrition and he is receiving supplements  He has hepatocellular cancer and he has had radiation treatments.  He has history of dilated cardiomyopathy and at that time had heart failure but his ejection fraction is over 60% now.  Diastolic function could not be determined  He has anemia likely related to chronic disease Principal Problem:   Pneumonia- HCAP Vs Aspiration Related with Hypoxia and Hypotension Active Problems:   Essential hypertension   GERD   Constipation  Cardiomyopathy, dilated, nonischemic (HCC)   Cancer, hepatocellular (HCC)   Failure to thrive in adult   Nausea in adult patient   Protein-calorie malnutrition, severe   Dehydration   Orthostatic dizziness   Acute Hypoxic respiratory failure  due to PNA   PSVT (paroxysmal supraventricular tachycardia) (HCC)   Hypotension   Goals of care, counseling/discussion   Palliative care by specialist    Plan: Continue treatments.  No changes in medications.  He is overall slowly improving    LOS: 6 days   MEHRAN MARLATT 03/17/2019, 8:09 AM

## 2019-03-17 NOTE — Progress Notes (Signed)
Modified Barium Swallow Progress Note  Patient Details  Name: [name] MRN: [id number] Date of Birth: [birth year]  Today's Date: 03/17/2019  Modified Barium Swallow completed.  Full report located under Chart Review in the Imaging Section.  Brief recommendations include the following:  Clinical Impression  Pt presents with mild/mod oropharyngeal dysphaghia characterized by weak lingual manipulation resulting in reduced bolus cohesiveness and reduced anterior posterior transit with piecemeal deglutition and oral residuals; pharyngeal phase is marked by reduced pharyngeal pressure, tongue base retraction, and epiglottic deflection resulting in vallecular, lateral channel, and pyriform sinus residuals with puree and solids (only min with liquids). Pt was unable to propel barium tablet posteriorly in presentation of thin or puree and had to expectorate the pill. Pt with significant gagging when attempting to swallow purees and solids. Alternating solids and liquids was partially effective in reducing pharyngeal residue of solids, however some remained. Pt was noted to cough frequently, however no penetration or aspiration observed. Esophageal sweep revealed air filled esophagus. Pt with gagging and belching throughout the study, which increased with solid trials. Recommend D1/puree and thin liquids with a focus on increasing liquid caloric needs for now as Pt did the best with this. SLP will follow during acute stay and recommend SNF post d/c from acute. PO medications crushed in ice cream or a small amount of puree.    Swallow Evaluation Recommendations       SLP Diet Recommendations: Dysphagia 1 (Puree) solids;Thin liquid   Liquid Administration via: Cup;Straw   Medication Administration: Crushed with puree   Supervision: Patient able to self feed;Full supervision/cueing for compensatory strategies   Compensations: Slow rate;Small sips/bites;Multiple dry swallows after each  bite/sip;Follow solids with liquid   Postural Changes: Remain semi-upright after after feeds/meals (Comment);Seated upright at 90 degrees   Oral Care Recommendations: Oral care BID;Staff/trained caregiver to provide oral care   Other Recommendations: Clarify dietary restrictions   Thank you,  Genene Churn, [place] 03/17/2019,1:18 PM

## 2019-03-17 NOTE — Progress Notes (Signed)
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 feeds/meals (Comment);Seated upright at 90 degrees   [facility] IP OTHER RECOMMENDATIONS 03/17/2019 Recommended Consults -- Oral Care Recommendations Oral care BID;Staff/trained caregiver to provide oral care Other Recommendations Clarify dietary restrictions   [facility] IP FOLLOW UP RECOMMENDATIONS 03/17/2019 Follow up Recommendations Skilled Nursing facility   [facility] IP FREQUENCY AND DURATION 03/17/2019 Speech Therapy Frequency (ACUTE ONLY) min 2x/week Treatment Duration 1 week      [facility] IP ORAL PHASE 03/17/2019 Oral Phase Impaired Oral - Pudding Teaspoon -- Oral - Pudding Cup -- Oral - Honey Teaspoon -- Oral - Honey Cup -- Oral - Nectar Teaspoon -- Oral - Nectar Cup -- Oral - Nectar Straw -- Oral - Thin Teaspoon Weak lingual manipulation;Piecemeal swallowing;Decreased bolus cohesion Oral - Thin Cup  Weak lingual manipulation;Piecemeal swallowing;Decreased bolus cohesion Oral - Thin Straw Lingual/palatal residue Oral - Puree Weak lingual manipulation;Reduced posterior propulsion;Lingual/palatal residue;Piecemeal swallowing;Delayed oral transit;Decreased bolus cohesion Oral - Mech Soft Weak lingual manipulation;Reduced posterior propulsion;Lingual/palatal residue;Piecemeal swallowing;Delayed oral transit;Decreased bolus cohesion Oral - Regular -- Oral - Multi-Consistency -- Oral - Pill Reduced posterior propulsion Oral  Phase - Comment --  [facility] IP PHARYNGEAL PHASE 03/17/2019 Pharyngeal Phase Impaired Pharyngeal- Pudding Teaspoon -- Pharyngeal -- Pharyngeal- Pudding Cup -- Pharyngeal -- Pharyngeal- Honey Teaspoon -- Pharyngeal -- Pharyngeal- Honey Cup -- Pharyngeal -- Pharyngeal- Nectar Teaspoon -- Pharyngeal -- Pharyngeal- Nectar Cup -- Pharyngeal -- Pharyngeal- Nectar Straw -- Pharyngeal -- Pharyngeal- Thin Teaspoon Delayed swallow initiation-vallecula;Reduced epiglottic inversion;Pharyngeal residue - valleculae;Reduced tongue base retraction Pharyngeal -- Pharyngeal- Thin Cup Delayed swallow initiation-vallecula;Reduced epiglottic inversion;Pharyngeal residue - valleculae;Reduced tongue base retraction Pharyngeal -- Pharyngeal- Thin Straw Pharyngeal residue - valleculae;Reduced tongue base retraction;Delayed swallow initiation-vallecula Pharyngeal -- Pharyngeal- Puree Delayed swallow initiation-vallecula;Reduced pharyngeal peristalsis;Reduced epiglottic inversion;Reduced tongue base retraction;Pharyngeal residue - valleculae;Pharyngeal residue - posterior pharnyx;Pharyngeal residue - pyriform;Lateral channel residue Pharyngeal -- Pharyngeal- Mechanical Soft Delayed swallow initiation-vallecula;Reduced pharyngeal peristalsis;Reduced epiglottic inversion;Reduced tongue base retraction;Pharyngeal residue - valleculae;Pharyngeal residue - posterior pharnyx;Pharyngeal residue - pyriform;Lateral channel residue  Pharyngeal -- Pharyngeal- Regular -- Pharyngeal -- Pharyngeal- Multi-consistency -- Pharyngeal -- Pharyngeal- Pill (No Data) Pharyngeal -- Pharyngeal Comment --  [facility] IP CERVICAL ESOPHAGEAL PHASE 03/17/2019 Cervical Esophageal Phase WFL Pudding Teaspoon -- Pudding Cup -- Honey Teaspoon -- Honey Cup -- Nectar Teaspoon -- Nectar Cup -- Nectar Straw -- Thin Teaspoon -- Thin Cup -- Thin Straw -- Puree -- Mechanical Soft -- Regular -- Multi-consistency -- Pill -- Cervical Esophageal Comment -- Thank you, Genene Churn, [place] 03/17/2019, 1:42 PM              [place] Ekg Site Rite  Result Date: 03/15/2019 If Site Rite image not attached, placement could not be confirmed due to current cardiac rhythm.    CBC Recent Labs  Lab 03/11/19 9323 03/12/19 0446 03/15/19 0436 03/15/19 2046 03/16/19 0408 03/17/19 0846  WBC 8.7 6.1 8.1  --  5.9 3.3*  HGB 13.2 12.2* 11.4* 10.0* 9.7* 8.1*  HCT 41.9 38.0* 36.7* 31.1* 30.6* 25.7*  PLT 151 106* 96*  --  85* 55*  MCV 93.1 92.2 95.1  --  93.0 92.8  MCH 29.3 29.6 29.5  --  29.5 29.2  MCHC 31.5 32.1 31.1  --  31.7 31.5  RDW 14.6 14.2 15.2  --  14.9 15.1    Chemistries  Recent Labs  Lab 03/12/19 0446 03/13/19 0549 03/14/19 1554 03/15/19 0436 03/16/19 0408  NA 134* 133* 135 134* 133*  K 2.8* 3.3* 3.6 4.9 3.8  CL 104 103 105 104 104  CO2 25 23 22 22 25   GLUCOSE 98 71 65* 231* 208*  BUN 15 11 15 17 13   CREATININE 0.47* 0.54* 0.80 0.64 0.43*  CALCIUM 8.0* 8.0* 7.9* 8.1* 7.6*  MG  --   --  1.8  --   --   AST  --   --   --   --  40  ALT  --   --   --   --  54*  ALKPHOS  --   --   --   --  283*  BILITOT  --   --   --   --  1.1   ------------------------------------------------------------------------------------------------------------------ No results for input(s): CHOL, HDL, LDLCALC, TRIG, CHOLHDL, LDLDIRECT in the last 72 hours.  Lab Results  Component Value Date   HGBA1C 8.6 (H) 08/19/2018    ------------------------------------------------------------------------------------------------------------------ No results for input(s): TSH, T4TOTAL, T3FREE, THYROIDAB in the last 72 hours.  Invalid input(s): FREET3 ------------------------------------------------------------------------------------------------------------------ No results for input(s): VITAMINB12, FOLATE, FERRITIN, TIBC, IRON, RETICCTPCT in the last 72 hours.  Coagulation profile No results for input(s): INR, PROTIME  in the last 168 hours.  No results for input(s): DDIMER in the last 72 hours.  Cardiac Enzymes No results for input(s): CKMB, TROPONINI, MYOGLOBIN in the last 168 hours.  Invalid input(s): CK ------------------------------------------------------------------------------------------------------------------ No results found for: BNP   Roxan Hockey M.D on 03/17/2019 at 4:18 PM  Go to [email or web address] - for contact info  [facility] - Office  [phone]

## 2019-03-18 DIAGNOSIS — R1312 Dysphagia, oropharyngeal phase: Secondary | ICD-10-CM

## 2019-03-18 LAB — BASIC METABOLIC PANEL
Anion gap: 6 (ref 5–15)
BUN: 9 mg/dL (ref 8–23)
CO2: 28 mmol/L (ref 22–32)
Calcium: 8.1 mg/dL — ABNORMAL LOW (ref 8.9–10.3)
Chloride: 101 mmol/L (ref 98–111)
Creatinine, Ser: 0.46 mg/dL — ABNORMAL LOW (ref 0.61–1.24)
GFR calc Af Amer: >60 mL/min (ref >60)
GFR calc non Af Amer: >60 mL/min (ref >60)
Glucose, Bld: 56 mg/dL — ABNORMAL LOW (ref 70–99)
Potassium: 2.8 mmol/L — ABNORMAL LOW (ref 3.5–5.1)
Sodium: 135 mmol/L (ref 135–145)

## 2019-03-18 LAB — PROTIME-INR
INR: 1.6 — ABNORMAL HIGH (ref 0.8–1.2)
Prothrombin Time: 18.5 seconds — ABNORMAL HIGH (ref 11.4–15.2)

## 2019-03-18 LAB — GLUCOSE, CAPILLARY
Glucose-Capillary: 180 mg/dL — ABNORMAL HIGH (ref 70–99)
Glucose-Capillary: 70 mg/dL (ref 70–99)

## 2019-03-18 MED ORDER — INSULIN ASPART 100 UNIT/ML ~~LOC~~ SOLN: 0.0000 [IU] | Freq: Three times a day (TID) | SUBCUTANEOUS | Status: DC

## 2019-03-18 MED ORDER — GUAIFENESIN ER 600 MG PO TB12
600.0000 mg | ORAL_TABLET | Freq: Two times a day (BID) | ORAL | Status: DC
  Administered 2019-03-18: 600 mg via ORAL
  Filled 2019-03-18 (×3): qty 1

## 2019-03-18 MED ORDER — POTASSIUM CHLORIDE CRYS ER 20 MEQ PO TBCR
40.0000 meq | EXTENDED_RELEASE_TABLET | Freq: Once | ORAL | Status: AC
  Administered 2019-03-18: 40 meq via ORAL
  Filled 2019-03-18: qty 2

## 2019-03-18 MED ORDER — INSULIN GLARGINE 100 UNIT/ML ~~LOC~~ SOLN
12.0000 [IU] | Freq: Every day | SUBCUTANEOUS | Status: DC
  Filled 2019-03-18 (×2): qty 0.12

## 2019-03-18 MED ORDER — POTASSIUM CHLORIDE CRYS ER 20 MEQ PO TBCR
40.0000 meq | EXTENDED_RELEASE_TABLET | ORAL | Status: AC
  Administered 2019-03-18 (×2): 40 meq via ORAL
  Filled 2019-03-18 (×2): qty 2

## 2019-03-18 MED ORDER — INSULIN ASPART 100 UNIT/ML ~~LOC~~ SOLN: 0.0000 [IU] | Freq: Every day | SUBCUTANEOUS | Status: DC

## 2019-03-18 MED ORDER — POTASSIUM CHLORIDE 10 MEQ/100ML IV SOLN
10.0000 meq | INTRAVENOUS | Status: AC
  Administered 2019-03-18 (×2): 10 meq via INTRAVENOUS
  Filled 2019-03-18 (×2): qty 100

## 2019-03-18 NOTE — Progress Notes (Signed)
Physical Therapy Treatment Patient Details Name: [name] MRN: [id number] DOB: 1941/06/19 Today's Date: 03/18/2019    History of Present Illness [name]  is a 78 y.o. 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 Delton Coombes , she presents today to multiple complaints, including abdominal pain, nausea, vomiting, generalized weakness, patient was at oncology office yesterday, he received 1 L of IV fluids at infusion center, wife patient remains significantly weak, with nausea, and vomiting, with no oral intake, as well complaining of abdominal pain, mainly post radiation, no improvement with Zofran which prompted her to bring him to ED, patient denies fever, chills, chest pain, shortness of breath, diarrhea .    PT Comments    REASSESSMENT:  Patient presents on 4 LPM O2, demonstrates slow labored movement with assistance to sit up at bedside, limited to a few steps during transfer to chair and unable to ambulate away from bedside due to c/o fatigue and difficulty breathing with poor standing balance.  Patient desaturated to 85-97% while seated in chair - RN notified.  Patient tolerated sitting up in chair with spouse present in room after therapy.  Plan: resume therapy with same treatment plan and goals as previous evaluation.  Patient will benefit from continued physical therapy in hospital and recommended venue below to increase strength, balance, endurance for safe ADLs and gait.   Follow Up Recommendations  SNF;Supervision for mobility/OOB;Supervision - Intermittent     Equipment Recommendations  None recommended by PT    Recommendations for Other Services       Precautions / Restrictions Precautions Precautions: Fall Precaution Comments: Pt states he  fell at home prior to coming to the hospital.  States that he does not fall often. Restrictions Weight Bearing Restrictions: No    Mobility  Bed Mobility Overal bed mobility: Needs Assistance Bed Mobility: Supine to Sit     Supine to sit: Min guard     General bed mobility comments: increased time, labored movement  Transfers Overall transfer level: Needs assistance Equipment used: None;Rolling walker (2 wheeled) Transfers: Sit to/from [facility] Sit to Stand: Min guard;Min assist Stand pivot transfers: Min assist       General transfer comment: slow labored movement  Ambulation/Gait Ambulation/Gait assistance: Min assist Gait Distance (Feet): 4 Feet Assistive device: Rolling walker (2 wheeled) Gait Pattern/deviations: Decreased step length - right;Decreased step length - left;Decreased stride length Gait velocity: decreased   General Gait Details: limited to 4-5 slow labored steps at bedside due to c/o fatigue and difficulty breathing with SpO2 dropping to 85-87% while on 4 LPM   Stairs             Wheelchair Mobility    Modified Rankin (Stroke Patients Only)       Balance Overall balance assessment: Needs assistance Sitting-balance support: Feet supported;No upper extremity supported Sitting balance-Leahy Scale: Fair Sitting balance - Comments: fair/good seated at bedside   Standing balance support: During functional activity;Bilateral upper extremity supported Standing balance-Leahy Scale: Fair Standing balance comment: fair using RW                            Cognition Arousal/Alertness: Awake/alert Behavior During Therapy: WFL for tasks assessed/performed Overall Cognitive Status: Within Functional Limits for tasks assessed  Exercises      General Comments        Pertinent Vitals/Pain Pain Assessment: No/denies pain    Home Living                       Prior Function            PT Goals (current goals can now be found in the care plan section) Acute Rehab PT Goals Patient Stated Goal: return  home with spouse to assist PT Goal Formulation: With patient Time For Goal Achievement: 04/01/19 Potential to Achieve Goals: Good Progress towards PT goals: Progressing toward goals    Frequency    Min 3X/week      PT Plan Current plan remains appropriate    Co-evaluation              AM-PAC PT "6 Clicks" Mobility   Outcome Measure  Help needed turning from your back to your side while in a flat bed without using bedrails?: None Help needed moving from lying on your back to sitting on the side of a flat bed without using bedrails?: A Little Help needed moving to and from a bed to a chair (including a wheelchair)?: A Little Help needed standing up from a chair using your arms (e.g., wheelchair or bedside chair)?: A Little Help needed to walk in hospital room?: A Lot Help needed climbing 3-5 steps with a railing? : A Lot 6 Click Score: 17    End of Session Equipment Utilized During Treatment: Oxygen Activity Tolerance: Patient tolerated treatment well;Patient limited by fatigue Patient left: in chair;with call bell/phone within reach;with chair alarm set;with family/visitor present Nurse Communication: Mobility status PT Visit Diagnosis: Unsteadiness on feet (R26.81);Other abnormalities of gait and mobility (R26.89);Muscle weakness (generalized) (M62.81)     Time: 8280-0349 PT Time Calculation (min) (ACUTE ONLY): 22 min  Charges:  $Therapeutic Activity: 8-22 mins                     1:42 PM, 03/18/19 Lonell Grandchild, MPT Physical Therapist with [facility] 336 [phone] office [phone] mobile phone

## 2019-03-18 NOTE — Care Management Important Message (Signed)
Important Message  Patient Details  Name: [name] MRN: [id number] Date of Birth: [birth year]   Medicare Important Message Given:  Yes     Tommy Medal 03/18/2019, 1:56 PM
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
## 2019-03-18 NOTE — Progress Notes (Signed)
  Speech Language Pathology Treatment: Dysphagia  Patient Details Name: [name] MRN: [id number] DOB: [birth year] Today's Date: 03/18/2019 Time: 1227-1252 SLP Time Calculation (min) (ACUTE ONLY): 25 min  Assessment / Plan / Recommendation Clinical Impression  Pt seen in room with wife present for dysphagia intervention following MBSS completed yesterday. Results and recommendations were again reviewed with Pt and spouse. Pt with significant oropharyngeal dysphagia with semi solids and solids with pharyngeal residuals. Pt currently swallowing thin liquids efficiently at this time and nutritional intake should focus on intake of nutrient dense liquids. Recommend RD consult to assist with this. Pt was observed with Breeze, Boost, and thicker chicken broth. Pt with "hawking", gagging, and coughing (more so with broth). This was observed during the MBSS too, however no penetration or aspiration of liquids observed. Pt c/o nausea and stomach discomfort. Pt/family are talking with MD about possible PEG to supplement oral intake. SLP will continue to follow.    HPI HPI: 78 year old male with a history aortic stenosis (s/p Repair, diabetes, hypertension, hyperlipidemia, recent diagnosis of hepatocellular carcinoma status post radiation. Presented to hospital with complaints of abdominal pain, nausea and vomiting. He was generally weak and having difficulty ambulating. Patient was admitted for failure to thrive, orthostasis, persistent nausea and vomiting. On 03/14/2019 patient developed persistent tachycardia with heart rate in the 150s to 160s initially and hypotension systolic blood pressure 28B to 80s initially, also became very hypoxic. Bil PNA-- HCAP Vs Aspiration related--hypoxia and tachypnea persist, requiring oxygen supplementation. BSE requested.      SLP Plan  Continue with current plan of care       Recommendations  Diet recommendations: Dysphagia 1 (puree);Thin liquid Liquids provided  via: Cup;Straw Medication Administration: Crushed with puree Supervision: Patient able to self feed;Intermittent supervision to cue for compensatory strategies Compensations: Slow rate;Small sips/bites;Multiple dry swallows after each bite/sip;Follow solids with liquid Postural Changes and/or Swallow Maneuvers: Seated upright 90 degrees;Upright 30-60 min after meal                Oral Care Recommendations: Oral care BID;Staff/trained caregiver to provide oral care Follow up Recommendations: Skilled Nursing facility SLP Visit Diagnosis: Dysphagia, oropharyngeal phase (R13.12) Plan: Continue with current plan of care       Thank you,  [name], [place]                 [place] 03/18/2019, 1:32 PM
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
## 2019-03-18 NOTE — Progress Notes (Signed)
Pharmacy Antibiotic Note  [name] is a 78 y.o. male admitted on 03/16/2019 with pneumonia.  Pharmacy has been consulted for zosyn and vancomycin dosing.  Plan: Continue Zosyn 3.375g IV every 8 hours. Monitor labs, c/s, and patient improvement.  Height: 5\' 8"  (172.7 cm) Weight: 130 lb 8.2 oz (59.2 kg) IBW/kg (Calculated) : 68.4  Temp (24hrs), Avg:97.8 F (36.6 C), Min:97.4 F (36.3 C), Max:98 F (36.7 C)  Recent Labs  Lab 03/12/19 0446 03/13/19 0549 03/14/19 1554 03/15/19 0436 03/16/19 0408 03/17/19 0846 03/18/19 0418  WBC 6.1  --   --  8.1 5.9 3.3*  --   CREATININE 0.47* 0.54* 0.80 0.64 0.43*  --  0.46*    Estimated Creatinine Clearance: 62.7 mL/min (A) (by C-G formula based on SCr of 0.46 mg/dL (L)).    Allergies  Allergen Reactions  . Actos [Pioglitazone] Other (See Comments)    Cramps in legs   . Metformin And Related Diarrhea  . Morphine Hives  . Statins     REACTION: leg cramps but tolerates pravastatin    Antimicrobials this admission:  zosyn 8/8>>   vancomycin 8/8 >> 8/10  Microbiology results: 8/9 Bcx: ngtd 8/9 RespCx: gram _+ cocci Rare budding yeast- contaminant 8/4 UCx: ng 8/8 MRSA PCR: negative -  8/4 Covid 19 negative   Thank you for allowing pharmacy to be a part of this patient's care.  Ramond Craver 03/18/2019 10:03 AM

## 2019-03-18 NOTE — Progress Notes (Signed)
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 TREATMENT RECOMMENDATION 03/17/2019 Treatment Recommendations Therapy as outlined in treatment plan below   Prognosis 03/17/2019 Prognosis for Safe Diet Advancement Fair Barriers to Reach Goals Severity of deficits Barriers/Prognosis Comment -- [facility] IP DIET RECOMMENDATION 03/17/2019 SLP Diet Recommendations Dysphagia 1 (Puree) solids;Thin liquid Liquid Administration via Cup;Straw Medication Administration Crushed with puree Compensations Slow rate;Small sips/bites;Multiple dry swallows after each bite/sip;Follow solids with liquid Postural Changes Remain semi-upright after after feeds/meals (Comment);Seated upright at 90 degrees   CH[facility] IP OTHER RECOMMENDATIONS 03/17/2019 Recommended Consults -- Oral Care Recommendations Oral care BID;Staff/trained caregiver to provide oral care Other Recommendations Clarify dietary restrictions   [facility] IP FOLLOW  UP RECOMMENDATIONS 03/17/2019 Follow up Recommendations Skilled Nursing facility   [facility] IP FREQUENCY AND DURATION 03/17/2019 Speech Therapy Frequency (ACUTE ONLY) min 2x/week Treatment Duration 1 week      [facility] IP ORAL PHASE 03/17/2019 Oral Phase Impaired Oral - Pudding Teaspoon -- Oral - Pudding Cup -- Oral - Honey Teaspoon -- Oral - Honey Cup -- Oral - Nectar Teaspoon -- Oral - Nectar Cup -- Oral - Nectar Straw --  Oral - Thin Teaspoon Weak lingual manipulation;Piecemeal swallowing;Decreased bolus cohesion Oral - Thin Cup Weak lingual manipulation;Piecemeal swallowing;Decreased bolus cohesion Oral - Thin Straw Lingual/palatal residue Oral - Puree Weak lingual manipulation;Reduced posterior propulsion;Lingual/palatal residue;Piecemeal swallowing;Delayed oral transit;Decreased bolus cohesion Oral - Mech Soft Weak lingual manipulation;Reduced posterior propulsion;Lingual/palatal residue;Piecemeal swallowing;Delayed oral transit;Decreased bolus cohesion Oral - Regular -- Oral - Multi-Consistency -- Oral - Pill Reduced posterior propulsion Oral Phase - Comment --  [facility]L IP PHARYNGEAL PHASE 03/17/2019 Pharyngeal Phase Impaired Pharyngeal- Pudding Teaspoon -- Pharyngeal -- Pharyngeal- Pudding Cup -- Pharyngeal -- Pharyngeal- Honey Teaspoon -- Pharyngeal -- Pharyngeal- Honey Cup -- Pharyngeal -- Pharyngeal- Nectar Teaspoon -- Pharyngeal -- Pharyngeal- Nectar Cup -- Pharyngeal -- Pharyngeal- Nectar Straw -- Pharyngeal -- Pharyngeal- Thin Teaspoon Delayed swallow initiation-vallecula;Reduced epiglottic inversion;Pharyngeal residue - valleculae;Reduced tongue base retraction Pharyngeal -- Pharyngeal- Thin Cup Delayed swallow initiation-vallecula;Reduced epiglottic inversion;Pharyngeal residue - valleculae;Reduced tongue base retraction Pharyngeal -- Pharyngeal- Thin Straw Pharyngeal residue - valleculae;Reduced tongue base retraction;Delayed swallow initiation-vallecula Pharyngeal -- Pharyngeal- Puree Delayed swallow  initiation-vallecula;Reduced pharyngeal peristalsis;Reduced epiglottic inversion;Reduced tongue base retraction;Pharyngeal residue - valleculae;Pharyngeal residue - posterior pharnyx;Pharyngeal residue - pyriform;Lateral channel residue Pharyngeal -- Pharyngeal- Mechanical Soft Delayed swallow initiation-vallecula;Reduced pharyngeal peristalsis;Reduced epiglottic inversion;Reduced tongue base retraction;Pharyngeal residue - valleculae;Pharyngeal residue - posterior pharnyx;Pharyngeal residue - pyriform;Lateral channel residue Pharyngeal -- Pharyngeal- Regular -- Pharyngeal -- Pharyngeal- Multi-consistency -- Pharyngeal -- Pharyngeal- Pill (No Data) Pharyngeal -- Pharyngeal Comment --  CHL IP CERVICAL ESOPHAGEAL PHASE 03/17/2019 Cervical Esophageal Phase WFL Pudding Teaspoon -- Pudding Cup -- Honey Teaspoon -- Honey Cup -- Nectar Teaspoon -- Nectar Cup -- Nectar Straw -- Thin Teaspoon -- Thin Cup -- Thin Straw -- Puree -- Mechanical Soft -- Regular -- Multi-consistency -- Pill -- Cervical Esophageal Comment -- Thank you, Genene Churn, [place] 03/17/2019, 1:42 PM              [place] Ekg Site Rite  Result Date: 03/15/2019 If Site Rite image not attached, placement could not be confirmed due to current cardiac rhythm.    CBC Recent Labs  Lab 03/12/19 0446 03/15/19 0436 03/15/19 2046 03/16/19 0408 03/17/19 0846  WBC 6.1 8.1  --  5.9 3.3*  HGB 12.2* 11.4* 10.0* 9.7* 8.1*  HCT 38.0* 36.7* 31.1* 30.6* 25.7*  PLT 106* 96*  --  85* 55*  MCV 92.2 95.1  --  93.0 92.8  MCH 29.6 29.5  --  29.5 29.2  MCHC 32.1 31.1  --  31.7 31.5  RDW 14.2 15.2  --  14.9 15.1    Chemistries  Recent Labs  Lab 03/13/19 0549 03/14/19 1554 03/15/19 0436 03/16/19 0408 03/18/19 0418  NA 133* 135 134* 133* 135  K 3.3* 3.6 4.9 3.8 2.8*  CL 103 105 104 104 101  CO2 23 22 22 25 28   GLUCOSE 71 65* 231* 208* 56*  BUN 11 15 17 13 9   CREATININE 0.54* 0.80 0.64 0.43* 0.46*  CALCIUM 8.0* 7.9* 8.1* 7.6*  8.1*  MG  --  1.8  --   --   --   AST  --   --   --  40  --   ALT  --   --   --  54*  --   ALKPHOS  --   --   --  283*  --   BILITOT  --   --   --  1.1  --    ------------------------------------------------------------------------------------------------------------------ No results for input(s): CHOL, HDL, LDLCALC, TRIG, CHOLHDL, LDLDIRECT in the last  72 hours.  Lab Results  Component Value Date   HGBA1C 8.6 (H) 08/19/2018   ------------------------------------------------------------------------------------------------------------------ No results for input(s): TSH, T4TOTAL, T3FREE, THYROIDAB in the last 72 hours.  Invalid input(s): FREET3 ------------------------------------------------------------------------------------------------------------------ No results for input(s): VITAMINB12, FOLATE, FERRITIN, TIBC, IRON, RETICCTPCT in the last 72 hours.  Coagulation profile No results for input(s): INR, PROTIME in the last 168 hours.  No results for input(s): DDIMER in the last 72 hours.  Cardiac Enzymes No results for input(s): CKMB, TROPONINI, MYOGLOBIN in the last 168 hours.  Invalid input(s): CK ------------------------------------------------------------------------------------------------------------------ No results found for: BNP   Roxan Hockey M.D on 03/18/2019 at 2:00 PM  Go to [email or web address] - for contact info  [facility] - Office  [phone]

## 2019-03-18 NOTE — Progress Notes (Signed)
Initial Nutrition Assessment  DOCUMENTATION CODES:   Severe malnutrition in context of chronic illness  INTERVENTION:  If decision if made for enteral feeding recommend:  - Start Osm. 1.5 @ 20 ml/hr via NGT/ PEG   -30 ml Prostat TID    Tube feeding regimen provides 1020 kcal, 75 grams of protein, and 366 ml of H2O.    -Multivitamin w/minerals   NUTRITION DIAGNOSIS:   Severe Malnutrition related to cancer and cancer related treatments as evidenced by per patient/family report, energy intake < or equal to 75% for > or equal to 1 month, percent weight loss.   GOAL:   Patient will meet greater than or equal to 90% of their needs  MONITOR:   Diet advancement, Supplement acceptance, Labs, Weight trends, PO intake REASON FOR ASSESSMENT:   Consult Assessment of nutrition requirement/status  ASSESSMENT: Patient is a 78 yo male with hx of DM, GERD, Constipation, CAD, HTN, Vitamin D deficiency and hepatocellular carcinoma (followed at [facility]).    He is taking an appetite stimulant. MBSS 8/11 and diet advanced pureed textures with thin liquids. Meal intake documented 10-40% which is not meeting his nutrition needs (noted below). Patient is having problem with vomiting and doesn't like the pureed foods. He and spouse are open to alternate nutrition source such as enteral feeding if MD agrees. Discussed with attending and Dr Constance Haw is being consulted. If decision for PEG is made he will need gradual progression of feeding due to increased refeeding risk.  Severe weight gain of 9 kg the past week or error of initial weight entry. He has moderate lower extremity edema. Patient says his recent weight around 120 lb. He had lost down to 111 lb on admission. Currently he is 130 lb.   Medications reviewed and include: dulcolax, vitamin D3, Marinol, Lantus, Lactulose, Omega-3, Protonix.   Labs: Potassium 2.8 (L), Glucose 56 (L), Cr 0.46 (L) BMP Latest Ref Rng & Units 03/18/2019 03/16/2019 03/15/2019   Glucose 70 - 99 mg/dL 56(L) 208(H) 231(H)  BUN 8 - 23 mg/dL 9 13 17   Creatinine 0.61 - 1.24 mg/dL 0.46(L) 0.43(L) 0.64  BUN/Creat Ratio 6 - 22 (calc) - - -  Sodium 135 - 145 mmol/L 135 133(L) 134(L)  Potassium 3.5 - 5.1 mmol/L 2.8(L) 3.8 4.9  Chloride 98 - 111 mmol/L 101 104 104  CO2 22 - 32 mmol/L 28 25 22   Calcium 8.9 - 10.3 mg/dL 8.1(L) 7.6(L) 8.1(L)     NUTRITION - FOCUSED PHYSICAL EXAM: NFPE conducted findings are severe fat depletion, severe muscle depletion and moderate LE edema.     Diet Order:   Diet Order            DIET - DYS 1 Room service appropriate? Yes; Fluid consistency: Thin  Diet effective now        Diet - low sodium heart healthy             EDUCATION NEEDS:   Education needs have been addressed   Skin:  Skin Assessment: Skin Integrity Issues: Skin Integrity Issues:: Stage I Stage I: sacrum  Last BM:  8/10  Height:   Ht Readings from Last 1 Encounters:  04/01/2019 5\' 8"  (1.727 m)    Weight:   Wt Readings from Last 1 Encounters:  03/17/19 59.2 kg  8/4- pt wt 50.2 kg  Ideal Body Weight:  70 kg  BMI:  Body mass index is 19.84 kg/m.  Estimated Nutritional Needs:   Kcal:  1740-2000 (35-40 kcal/kg/bw)  Protein:  75-85 gr (1.5-1.7 gr/kg/bw)  Fluid:  1500 ml daily   Colman Cater MS,RD,CSG,LDN Office: [phone] Pager: [phone]

## 2019-03-18 NOTE — Progress Notes (Signed)
Inpatient Diabetes Program Recommendations  AACE/ADA: New Consensus Statement on Inpatient Glycemic Control (2015)  Target Ranges:  Prepandial:   less than 140 mg/dL      Peak postprandial:   less than 180 mg/dL (1-2 hours)      Critically ill patients:  140 - 180 mg/dL   Lab Results  Component Value Date   GLUCAP 177 (H) 01/07/2019   HGBA1C 8.6 (H) 08/19/2018    Review of Glycemic Control  Diabetes history: DM2 Outpatient Diabetes medications: Previously on Basaglar 36 units QD Current orders for Inpatient glycemic control: Lantus 15 units QD  Had hypoglycemia of 56 mg/dL. Needs correction insulin.  Inpatient Diabetes Program Recommendations:     Decrease Lantus to 10 units QD Add Novolog 0-9 units tidwc  Will continue to follow.  Thank you. Lorenda Peck, RD, LDN, CDE Inpatient Diabetes Coordinator [phone]

## 2019-03-19 ENCOUNTER — Other Ambulatory Visit (HOSPITAL_COMMUNITY): Payer: Medicare HMO

## 2019-03-19 ENCOUNTER — Ambulatory Visit (HOSPITAL_COMMUNITY): Payer: Medicare HMO | Admitting: Hematology

## 2019-03-19 LAB — GLUCOSE, CAPILLARY
Glucose-Capillary: 50 mg/dL — ABNORMAL LOW (ref 70–99)
Glucose-Capillary: 77 mg/dL (ref 70–99)
Glucose-Capillary: 96 mg/dL (ref 70–99)
Glucose-Capillary: 89 mg/dL (ref 70–99)
Glucose-Capillary: 116 mg/dL — ABNORMAL HIGH (ref 70–99)

## 2019-03-19 LAB — CBC
HCT: 31 % — ABNORMAL LOW (ref 39.0–52.0)
Hemoglobin: 10.1 g/dL — ABNORMAL LOW (ref 13.0–17.0)
MCH: 29.3 pg (ref 26.0–34.0)
MCHC: 32.6 g/dL (ref 30.0–36.0)
MCV: 89.9 fL (ref 80.0–100.0)
Platelets: 70 10*3/uL — ABNORMAL LOW (ref 150–400)
RBC: 3.45 MIL/uL — ABNORMAL LOW (ref 4.22–5.81)
RDW: 15.4 % (ref 11.5–15.5)
WBC: 4.7 10*3/uL (ref 4.0–10.5)
nRBC: 0 % (ref 0.0–0.2)

## 2019-03-19 LAB — BASIC METABOLIC PANEL
Anion gap: 6 (ref 5–15)
BUN: 11 mg/dL (ref 8–23)
CO2: 29 mmol/L (ref 22–32)
Calcium: 8.2 mg/dL — ABNORMAL LOW (ref 8.9–10.3)
Chloride: 101 mmol/L (ref 98–111)
Creatinine, Ser: 0.5 mg/dL — ABNORMAL LOW (ref 0.61–1.24)
GFR calc Af Amer: >60 mL/min (ref >60)
GFR calc non Af Amer: >60 mL/min (ref >60)
Glucose, Bld: 54 mg/dL — ABNORMAL LOW (ref 70–99)
Potassium: 3.4 mmol/L — ABNORMAL LOW (ref 3.5–5.1)
Sodium: 136 mmol/L (ref 135–145)

## 2019-03-19 LAB — HEMOGLOBIN A1C
Hgb A1c MFr Bld: 7.8 % — ABNORMAL HIGH (ref 4.8–5.6)
Mean Plasma Glucose: 177.16 mg/dL

## 2019-03-19 LAB — MAGNESIUM: Magnesium: 1.9 mg/dL (ref 1.7–2.4)

## 2019-03-19 MED ORDER — LACTULOSE 10 GM/15ML PO SOLN
20.0000 g | Freq: Every day | ORAL | Status: DC
  Administered 2019-03-21 – 2019-03-22 (×2): 20 g
  Filled 2019-03-19 (×2): qty 30

## 2019-03-19 MED ORDER — POTASSIUM CHLORIDE CRYS ER 20 MEQ PO TBCR
40.0000 meq | EXTENDED_RELEASE_TABLET | ORAL | Status: AC
  Administered 2019-03-19 (×2): 40 meq via ORAL
  Filled 2019-03-19 (×2): qty 2

## 2019-03-19 MED ORDER — POTASSIUM CHLORIDE CRYS ER 10 MEQ PO TBCR: 10.0000 meq | EXTENDED_RELEASE_TABLET | Freq: Two times a day (BID) | ORAL | Status: DC

## 2019-03-19 MED ORDER — GUAIFENESIN 100 MG/5ML PO SOLN
15.0000 mL | Freq: Four times a day (QID) | ORAL | Status: DC
  Administered 2019-03-19 – 2019-03-21 (×5): 300 mg via ORAL
  Filled 2019-03-19 (×2): qty 15
  Filled 2019-03-19: qty 5
  Filled 2019-03-19 (×2): qty 15

## 2019-03-19 MED ORDER — POTASSIUM CHLORIDE 20 MEQ/15ML (10%) PO SOLN
10.0000 meq | Freq: Two times a day (BID) | ORAL | Status: DC
  Administered 2019-03-19 – 2019-03-21 (×2): 10 meq via ORAL
  Filled 2019-03-19 (×2): qty 30

## 2019-03-19 MED ORDER — HEPARIN SODIUM (PORCINE) 5000 UNIT/ML IJ SOLN
5000.0000 [IU] | Freq: Three times a day (TID) | INTRAMUSCULAR | Status: DC
  Administered 2019-03-21 – 2019-03-23 (×7): 5000 [IU] via SUBCUTANEOUS
  Filled 2019-03-19 (×7): qty 1

## 2019-03-19 MED ORDER — INSULIN GLARGINE 100 UNIT/ML ~~LOC~~ SOLN
12.0000 [IU] | Freq: Every day | SUBCUTANEOUS | Status: DC
  Administered 2019-03-21 – 2019-03-22 (×2): 12 [IU] via SUBCUTANEOUS
  Filled 2019-03-19 (×5): qty 0.12

## 2019-03-19 MED ORDER — PANTOPRAZOLE SODIUM 40 MG PO PACK
40.0000 mg | PACK | Freq: Two times a day (BID) | ORAL | Status: DC
  Administered 2019-03-19 – 2019-03-21 (×2): 40 mg via ORAL
  Filled 2019-03-19 (×2): qty 20

## 2019-03-19 NOTE — Progress Notes (Addendum)
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 03/17/2019 Treatment Recommendations Therapy as outlined in treatment plan below   Prognosis 03/17/2019 Prognosis for Safe Diet Advancement Fair Barriers to Reach Goals Severity of deficits Barriers/Prognosis Comment -- CHL IP DIET RECOMMENDATION 03/17/2019 SLP Diet Recommendations Dysphagia 1 (Puree) solids;Thin liquid Liquid Administration via Cup;Straw Medication Administration Crushed with puree Compensations Slow rate;Small sips/bites;Multiple dry swallows after each bite/sip;Follow solids with liquid Postural Changes Remain semi-upright after after feeds/meals (Comment);Seated upright at 90 degrees   CHL IP OTHER RECOMMENDATIONS 03/17/2019 Recommended Consults -- Oral Care Recommendations Oral care BID;Staff/trained caregiver to provide oral care Other Recommendations Clarify dietary restrictions   [facility]L IP FOLLOW  UP RECOMMENDATIONS 03/17/2019 Follow up Recommendations Skilled Nursing facility   [facility] IP FREQUENCY AND DURATION 03/17/2019 Speech Therapy Frequency (ACUTE ONLY) min 2x/week Treatment Duration 1 week      CHL IP ORAL PHASE 03/17/2019 Oral Phase Impaired Oral - Pudding Teaspoon -- Oral - Pudding Cup -- Oral - Honey Teaspoon -- Oral - Honey Cup -- Oral - Nectar Teaspoon -- Oral - Nectar Cup -- Oral -  Nectar Straw -- Oral - Thin Teaspoon Weak lingual manipulation;Piecemeal swallowing;Decreased bolus cohesion Oral - Thin Cup Weak lingual manipulation;Piecemeal swallowing;Decreased bolus cohesion Oral - Thin Straw Lingual/palatal residue Oral - Puree Weak lingual manipulation;Reduced posterior propulsion;Lingual/palatal residue;Piecemeal swallowing;Delayed oral transit;Decreased bolus cohesion Oral - Mech Soft Weak lingual manipulation;Reduced posterior propulsion;Lingual/palatal residue;Piecemeal swallowing;Delayed oral transit;Decreased bolus cohesion Oral - Regular -- Oral - Multi-Consistency -- Oral - Pill Reduced posterior propulsion Oral Phase - Comment --  CHL IP PHARYNGEAL PHASE 03/17/2019 Pharyngeal Phase Impaired Pharyngeal- Pudding Teaspoon -- Pharyngeal -- Pharyngeal- Pudding Cup -- Pharyngeal -- Pharyngeal- Honey Teaspoon -- Pharyngeal -- Pharyngeal- Honey Cup -- Pharyngeal -- Pharyngeal- Nectar Teaspoon -- Pharyngeal -- Pharyngeal- Nectar Cup -- Pharyngeal -- Pharyngeal- Nectar Straw -- Pharyngeal -- Pharyngeal- Thin Teaspoon Delayed swallow initiation-vallecula;Reduced epiglottic inversion;Pharyngeal residue - valleculae;Reduced tongue base retraction Pharyngeal -- Pharyngeal- Thin Cup Delayed swallow initiation-vallecula;Reduced epiglottic inversion;Pharyngeal residue - valleculae;Reduced tongue base retraction Pharyngeal -- Pharyngeal- Thin Straw Pharyngeal residue - valleculae;Reduced tongue base retraction;Delayed swallow initiation-vallecula Pharyngeal -- Pharyngeal- Puree Delayed swallow  initiation-vallecula;Reduced pharyngeal peristalsis;Reduced epiglottic inversion;Reduced tongue base retraction;Pharyngeal residue - valleculae;Pharyngeal residue - posterior pharnyx;Pharyngeal residue - pyriform;Lateral channel residue Pharyngeal -- Pharyngeal- Mechanical Soft Delayed swallow initiation-vallecula;Reduced pharyngeal peristalsis;Reduced epiglottic inversion;Reduced tongue base retraction;Pharyngeal residue - valleculae;Pharyngeal residue - posterior pharnyx;Pharyngeal residue - pyriform;Lateral channel residue Pharyngeal -- Pharyngeal- Regular -- Pharyngeal -- Pharyngeal- Multi-consistency -- Pharyngeal -- Pharyngeal- Pill (No Data) Pharyngeal -- Pharyngeal Comment --  CHL IP CERVICAL ESOPHAGEAL PHASE 03/17/2019 Cervical Esophageal Phase WFL Pudding Teaspoon -- Pudding Cup -- Honey Teaspoon -- Honey Cup -- Nectar Teaspoon -- Nectar Cup -- Nectar Straw -- Thin Teaspoon -- Thin Cup -- Thin Straw -- Puree -- Mechanical Soft -- Regular -- Multi-consistency -- Pill -- Cervical Esophageal Comment -- Thank you, Genene Churn, [place] 03/17/2019, 1:42 PM              [place] Ekg Site Rite  Result Date: 03/15/2019 If Site Rite image not attached, placement could not be confirmed due to current cardiac rhythm.    CBC Recent Labs  Lab 03/15/19 0436 03/15/19 2046 03/16/19 0408 03/17/19 0846 03/19/19 0406  WBC 8.1  --  5.9 3.3* 4.7  HGB 11.4* 10.0* 9.7* 8.1* 10.1*  HCT 36.7* 31.1* 30.6* 25.7* 31.0*  PLT 96*  --  85* 55* 70*  MCV 95.1  --  93.0 92.8 89.9  MCH 29.5  --  29.5 29.2 29.3  MCHC 31.1  --  31.7 31.5 32.6  RDW 15.2  --  14.9 15.1 15.4    Chemistries  Recent Labs  Lab 03/14/19 1554 03/15/19 0436 03/16/19 0408 03/18/19 0418 03/19/19 0406  NA 135 134* 133* 135 136  K 3.6 4.9 3.8 2.8* 3.4*  CL 105 104 104 101 101  CO2 22 22 25 28 29   GLUCOSE 65* 231* 208* 56* 54*  BUN 15 17 13 9 11   CREATININE 0.80 0.64 0.43* 0.46* 0.50*  CALCIUM 7.9* 8.1* 7.6* 8.1*  8.2*  MG 1.8  --   --   --  1.9  AST  --   --  40  --   --   ALT  --   --  54*  --   --   ALKPHOS  --   --  283*  --   --   BILITOT  --   --  1.1  --   --    ------------------------------------------------------------------------------------------------------------------ No results for input(s): CHOL, HDL, LDLCALC, TRIG, CHOLHDL, LDLDIRECT in the  last 72 hours.  Lab Results  Component Value Date   HGBA1C 7.8 (H) 03/18/2019   ------------------------------------------------------------------------------------------------------------------ No results for input(s): TSH, T4TOTAL, T3FREE, THYROIDAB in the last 72 hours.  Invalid input(s): FREET3 ------------------------------------------------------------------------------------------------------------------ No results for input(s): VITAMINB12, FOLATE, FERRITIN, TIBC, IRON, RETICCTPCT in the last 72 hours.  Coagulation profile Recent Labs  Lab 03/18/19 1618  INR 1.6*    No results for input(s): DDIMER in the last 72 hours.  Cardiac Enzymes No results for input(s): CKMB, TROPONINI, MYOGLOBIN in the last 168 hours.  Invalid input(s): CK ------------------------------------------------------------------------------------------------------------------ No results found for: BNP   Roxan Hockey M.D on 03/19/2019 at 6:31 PM  Go to [email or web address] - for contact info  [facility] - Office  [phone]

## 2019-03-19 NOTE — Progress Notes (Signed)
Daily Progress Note   Patient Name: [name]       Date: 03/19/2019 DOB: 1941/07/06  Age: 78 y.o. MRN#: [id number] Attending Physician: Roxan Hockey, MD Primary Care Physician: [name], MD Admit Date: 03/28/2019  Reason for Consultation/Follow-up: Establishing goals of care  Subjective: Patient awake, alert, oriented. Able to participate in conversation. Taking pills for RN and having difficulty swallowing and coughing. He appears very weak and deconditioned.   GOC:  Joined Dr. Constance Haw for [place] discussion with patient and wife at bedside. Their three children (daughter and two sons) on speaker phone.   Discussed at length diagnoses, interventions, risks/benefits with PEG tube placement.   Discussed options including SNF (which won't allow family to visit) versus home with home health or hospice services. Discussed quality of life and PEG tube not being a fix to cancer diagnosis, nausea/vomiting, dysphagia, and aspiration risk. Also not necessarily giving better quality of life with underlying deconditioning and chronic conditions. Discussed options in detail and allowed family to ask questions and concerns.   Son states "we will support whatever you decide" and all children agree that this decision ultimately should be by their father and mother. Children acknowledge this will be an 'uphill battle.' Daughter states "we don't want to see him suffer." Children seem to understand seriousness of medical conditions and concern with poor prognosis.   Wife pretty quick to say that he wishes for the feeding tube to see if this will give him nutrition and strength to work with therapy. Also that he can receive his medications by the tube.   Dr. Constance Haw and I encouraged family to further  discuss, as we do not want them to feel rushed by this tough decision. PMT contact information given. Reassured wife that I would f/u this afternoon before she leaves.    ADDENDUM: F/u with patient and wife at bedside regarding our conversation this morning. Patient tells me he wishes to pursue PEG tube placement and give it a try. Answered questions about the procedure. Updated Dr. Denton Brick.   Length of Stay: 8  Current Medications: Scheduled Meds:  . albuterol  2.5 mg Nebulization TID  . aspirin EC  81 mg Oral Daily  . cholecalciferol  2,000 Units Oral Daily  . digoxin  0.125 mg Oral Daily  . dronabinol  5  mg Oral BID AC  . feeding supplement (ENSURE ENLIVE)  237 mL Oral BID BM  . guaiFENesin  15 mL Oral QID  . heparin  5,000 Units Subcutaneous Q8H  . insulin aspart  0-5 Units Subcutaneous QHS  . insulin aspart  0-6 Units Subcutaneous TID WC  . insulin glargine  12 Units Subcutaneous Daily  . lactulose  20 g Oral Daily  . mouth rinse  15 mL Mouth Rinse BID  . omega-3 acid ethyl esters  1 g Oral QPM  . pantoprazole sodium  40 mg Oral BID AC  . potassium chloride  10 mEq Oral BID  . potassium chloride  40 mEq Oral Q3H    Continuous Infusions: . dextrose 5 % and 0.45 % NaCl with KCl 20 mEq/L 20 mL/hr at 03/18/19 1557  . piperacillin-tazobactam (ZOSYN)  IV 3.375 g (03/19/19 8756)    PRN Meds: guaiFENesin-dextromethorphan, HYDROmorphone (DILAUDID) injection, meclizine, ondansetron (ZOFRAN) IV, oxyCODONE  Physical Exam Vitals signs and nursing note reviewed.  Constitutional:      General: He is awake.     Appearance: He is cachectic. He is ill-appearing.  HENT:     Head: Normocephalic and atraumatic.  Cardiovascular:     Rate and Rhythm: Normal rate.  Pulmonary:     Effort: No tachypnea, accessory muscle usage or respiratory distress.  Skin:    General: Skin is warm and dry.     Coloration: Skin is pale.  Neurological:     Mental Status: He is alert and oriented to  person, place, and time.  Psychiatric:        Mood and Affect: Mood normal.        Speech: Speech normal.            Vital Signs: BP 113/62 (BP Location: Left Arm)   Pulse (!) 106   Temp 98.6 F (37 C) (Oral)   Resp 20   Ht 5\' 8"  (1.727 m)   Wt 59.2 kg   SpO2 92%   BMI 19.84 kg/m  SpO2: SpO2: 92 % O2 Device: O2 Device: Nasal Cannula O2 Flow Rate: O2 Flow Rate (L/min): 4 L/min  Intake/output summary:   Intake/Output Summary (Last 24 hours) at 03/19/2019 1112 Last data filed at 03/19/2019 0500 Gross per 24 hour  Intake 1548.17 ml  Output 1100 ml  Net 448.17 ml   LBM: Last BM Date: 03/16/19 Baseline Weight: Weight: 50.3 kg Most recent weight: Weight: 59.2 kg       Palliative Assessment/Data: PPS 40%    Flowsheet Rows     Most Recent Value  Intake Tab  Referral Department  Hospitalist  Unit at Time of Referral  ICU  Palliative Care Primary Diagnosis  Cancer  Palliative Care Type  New Palliative care  Reason for referral  Clarify Goals of Care  Date first seen by Palliative Care  03/16/19  Clinical Assessment  Palliative Performance Scale Score  40%  Psychosocial & Spiritual Assessment  Palliative Care Outcomes  Patient/Family meeting held?  Yes  Who was at the meeting?  patient and wife  Palliative Care Outcomes  Clarified goals of care, Provided end of life care assistance, Provided advance care planning, Provided psychosocial or spiritual support, ACP counseling assistance, Completed durable DNR, Changed CPR status      Patient Active Problem List   Diagnosis Date Noted  . Oropharyngeal dysphagia   . Palliative care by specialist   . Pneumonia- HCAP Vs Aspiration Related with Hypoxia and Hypotension 03/15/2019  .  Acute Hypoxic respiratory failure  due to PNA 03/15/2019  . PSVT (paroxysmal supraventricular tachycardia) ([place]) 03/15/2019  . Hypotension 03/15/2019  . Goals of care, counseling/discussion 03/15/2019  . Orthostatic dizziness 03/14/2019  .  Protein-calorie malnutrition, severe 03/11/2019  . Dehydration   . Failure to thrive in adult 04/01/2019  . Nausea in adult patient 03/30/2019  . Closed fracture of left proximal humerus 11/24/18 02/05/2019  . Elevated liver function tests   . Cancer, hepatocellular ([place]) 11/24/2018  . Anorexia   . Calculus of gallbladder without cholecystitis without obstruction   . Dilated pancreatic duct   . Liver lesion   . Pancreatitis 11/11/2018  . AKI (acute kidney injury) ([place]) 11/11/2018  . Diabetes mellitus type 2, uncomplicated ([place]) 77/82/4235  . Osteoporosis 11/15/2015  . Osteopenia determined by x-ray 04/12/2015  . 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  . 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 Carotid artery stenosis 09/02/2014  . 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 Postoperative atrial fibrillation ([place]) 04/18/2014  . 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  . Aortic stenosis 04/08/2014  . Chronic systolic heart failure ([place]) 04/08/2014  . Unstable angina ([place]) 04/08/2014  . Chest pain 04/08/2014  . Personal history of colonic polyps 11/26/2013  . Loss of weight 11/02/2013  . Aortic valve disorder 07/11/2010  . LBBB 07/11/2010  . Bilateral carotid bruits 07/11/2010  . NEOPLASM OF UNCERTAIN BEHAVIOR OF SKIN 09/27/2008  . BENIGN PROSTATIC HYPERTROPHY, WITH OBSTRUCTION 09/27/2008  . KNEE PAIN 09/27/2008  . FLATULENCE ERUCTATION AND GAS PAIN 04/06/2008  . ALLERGIC RHINITIS 01/09/2008  . Constipation 10/17/2007  . HEADACHE 07/25/2007  . LEG CRAMPS 02/13/2007  . DYSPNEA ON EXERTION 01/10/2007  . Hyperlipidemia 10/10/2006  . Essential hypertension 10/10/2006  . GERD 10/10/2006  . CARDIAC MURMUR 10/10/2006  . CHEST PAIN, EXERTIONAL 10/10/2006    Palliative Care Assessment & Plan   Patient Profile: 78 y.o. male  with past medical history of hepatocellular carcinoma s/p radiation, aortic stenosis s/p repair, DM, HTN, HLD CAD admitted on 03/09/2019 with  abdominal pain, n/v, weakness. Patient admitted for failure to thrive. CT abdomen negative for acute findings.  On 8/8, patient developed persistent tachycardia, hypotension and hypoxia requiring transfer to ICU for vasopressor support and HFNC. Bilateral HCAP vs. Aspiration pneumonia. Antibiotics initiated. SLP evaluated and planning for MBS on 8/11. Palliative medicine consultation for goals of care.   Assessment: Hepatocellular carcinoma Bilateral pneumonia, suspect aspiration Tachyarrhythmia with hypotension Acute hypoxic respiratory failure Severe protein calorie malnutrition Adult failure to thrive Dysphagia Generalized weakness and deconditioning  Recommendations/Plan:  F/u GOC with Dr. Constance Haw at bedside. Wife at bedside and three children on speaker phone. Discussed in detail options including PEG/SNF versus home with comfort approach and hospice. Answered questions regarding both options and allowed patient/family time to discuss.   After further discussion and f/u this afternoon, patient and wife confirm the decision to pursue PEG tube placement, in hopes that he will get the nutrition necessary to prolong life and possibly give him strength to improve and work with therapy. Dr. Constance Haw updated by attending.   May benefit from ongoing palliative support at SNF. High risk for re-hospitalization.    Code Status: limited   Code Status Orders  (From admission, onward)         Start     Ordered   03/16/19 1334  Limited resuscitation (code)  Continuous    Question Answer Comment  In the event of cardiac or respiratory ARREST: Initiate Code Blue, Call Rapid Response Yes  In the event of cardiac or respiratory ARREST: Perform CPR No   In the event of cardiac or respiratory ARREST: Perform Intubation/Mechanical Ventilation Yes   In the event of cardiac or respiratory ARREST: Use NIPPV/BiPAp only if indicated Yes   In the event of cardiac or respiratory ARREST: Administer ACLS  medications if indicated Yes   In the event of cardiac or respiratory ARREST: Perform Defibrillation or Cardioversion if indicated Yes   Comments NO COMPRESSIONS      03/16/19 1333        Code Status History    Date Active Date Inactive Code Status Order ID Comments User Context   04/02/2019 1652 03/16/2019 1333 DNR [id number]  Albertine Patricia, MD ED   11/11/2018 0841 11/14/2018 1610 Full Code [id number]  Tawni Millers, MD Inpatient   04/13/2014 1309 04/20/2014 1542 Full Code [id number]  Caryn Bee Inpatient   04/09/2014 1658 04/13/2014 1309 Full Code [id number]  Jettie Booze, MD Inpatient   04/08/2014 2136 04/09/2014 1658 Full Code [id number]  Skeet Latch, MD Inpatient   Advance Care Planning Activity    Advance Directive Documentation     Most Recent Value  Type of Advance Directive  Out of facility DNR (pink MOST or yellow form) [patient is DNR]  Pre-existing out of facility DNR order (yellow form or pink MOST form)  -  "MOST" Form in Place?  -       Prognosis:   Poor prognosis  Discharge Planning:  To Be Determined  Care plan was discussed with patient, wife, three children on speaker phone, RN, Dr. Constance Haw, Dr. Denton Brick  Thank you for allowing the Palliative Medicine Team to assist in the care of this patient.   Time In: 1000 1340 Time Out: 1100 1350 Total Time 70 Prolonged Time Billed  yes      Greater than 50%  of this time was spent counseling and coordinating care related to the above assessment and plan.  Ihor Dow, DNP, FNP-C Palliative Medicine Team  Phone: [phone] Fax: [phone]  Please contact Palliative Medicine Team phone at [phone] for questions and concerns.
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
## 2019-03-19 NOTE — Progress Notes (Addendum)
[facility]  Extensive conversation had this AM with patient and wife, and children X 3 on phone. Ihor Dow NP was also present.  Spoke about PEG. We discussed that the PEG is for nutrition and to help with calories. We discussed that he will likely not gain much or any weight or put on muscle but at least be able to sustain where he is now. We discussed need for PEG if he is going to a Rehab/ SNF, and discussed that we do not know how he will do with Rehab. Discussed his deconditioning and his pneumonia. Discussed that he has weeks of rehab to even know if he can get back to last week preadmission status.  We discussed that the PEG will not treat the cancer. It will not treat his abdominal pain, nausea, vomiting, and could make those things worse. It will not prevent him from aspirating. We do not even know how he will tolerate feeds, but he has no anatomic reason to not tolerate them.   We discussed the option of home with hospice. We discussed that the patient expressed desire to me to go home. We discussed that he is at risk of bleeding and more so due to his liver disease, and risk of infection, injury to other organs, and that he could need intubation during the procedure due to the need for sedation and his respiratory status. This call could lead to a worse outcome in the end.   I discussed this with the family for over 35 minutes in the room with the wife and children involved in the discussion and asking questions.   PEG Tomorrow.   Curlene Labrum, MD [facility] [address] [place], [place] [address] [phone] (office)

## 2019-03-19 NOTE — TOC Progression Note (Signed)
Transition of Care [facility]) - Progression Note    Patient Details  Name: [name] MRN: [id number] Date of Birth: 04-22-1941  Transition of Care [facility]) CM/SW Contact  Ihor Gully, LCSW Phone Number: 03/19/2019, 11:53 AM  Clinical Narrative:    Facility has authorization. Per attending during progression rounds patient will likely be discharge appropriate on Saturday. Kerri at [facility] notified of expected discharge date and that patient now had PEG.   Expected Discharge Plan: [place] Barriers to Discharge: No Barriers Identified  Expected Discharge Plan and Services Expected Discharge Plan: [place] arrangements for the past 2 months: Single Family Home Expected Discharge Date: 03/13/19                         HH Arranged: RN, PT [place] Agency: [place] ([facility])         Social Determinants of Health (SDOH) Interventions    Readmission Risk Interventions Readmission Risk Prevention Plan 03/16/2019 03/13/2019  Transportation Screening - Complete  PCP or Specialist Appt within 3-5 Days - Complete  HRI or [place] - Complete  Social Work Consult for [place] Planning/Counseling - Complete  Palliative Care Screening - Not Applicable  Medication Review Press photographer) - Complete  SW Recovery Care/Counseling Consult Complete -  Palliative Care Screening Not Applicable -  [place] Complete -  Some recent data might be hidden

## 2019-03-19 NOTE — Progress Notes (Signed)
Physical Therapy Treatment Patient Details Name: [name] MRN: [id number] DOB: 02/11/1941 Today's Date: 03/19/2019    History of Present Illness [name]  is a 78 y.o. 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 Delton Coombes , she presents today to multiple complaints, including abdominal pain, nausea, vomiting, generalized weakness, patient was at oncology office yesterday, he received 1 L of IV fluids at infusion center, wife patient remains significantly weak, with nausea, and vomiting, with no oral intake, as well complaining of abdominal pain, mainly post radiation, no improvement with Zofran which prompted her to bring him to ED, patient denies fever, chills, chest pain, shortness of breath, diarrhea .    PT Comments    Pt napping but easily awoken and willing to participate.  Pt limited by weakness and decreased O2 saturation with exertion.  O2 saturation ranges from 85-92% through session on 4L O2 via nasal canal.  Upon standing pt with running bowel movement.  Ambulated to [facility] with min guard/A using RW.  NT assisted with cleaning.  While sitting pt c/o need to vomit so trash can placed infront.  EOS pt left in chair with call bell within reach, chair alarm set and NT left in room.  RN aware of pt's status, O2 saturation, stomach pain and bowel/vomiting.     Follow Up Recommendations  SNF;Supervision for mobility/OOB;Supervision - Intermittent     Equipment Recommendations  None recommended by PT    Recommendations for Other Services       Precautions / Restrictions Precautions Precautions: Fall Precaution Comments: Pt states he fell at home prior to coming to the hospital.  States that he does not fall often. Restrictions Weight Bearing  Restrictions: No    Mobility  Bed Mobility Overal bed mobility: Modified Independent Bed Mobility: Supine to Sit     Supine to sit: Supervision     General bed mobility comments: increased time, labored movement  Transfers Overall transfer level: Needs assistance Equipment used: None;Rolling walker (2 wheeled) Transfers: Sit to/from Stand Sit to Stand: Min guard         General transfer comment: slow labored movement, able to follow cueing for hand placement  Ambulation/Gait Ambulation/Gait assistance: Min assist Gait Distance (Feet): 10 Feet(60ft to BSC then 80ft to chair) Assistive device: Rolling walker (2 wheeled) Gait Pattern/deviations: Decreased step length - right;Decreased step length - left;Decreased stride length Gait velocity: decreased   General Gait Details: limited to 4-5 slow labored steps at bedside due to c/o fatigue and difficulty breathing with SpO2 dropping to 85-87% while on 4 LPM   Stairs             Wheelchair Mobility    Modified Rankin (Stroke Patients Only)       Balance                                            Cognition Arousal/Alertness: Awake/alert Behavior During Therapy: WFL for tasks assessed/performed Overall Cognitive Status: Within Functional Limits for tasks assessed  Exercises      General Comments        Pertinent Vitals/Pain Pain Assessment: 0-10 Pain Score: 7  Pain Location: stomach pain Pain Descriptors / Indicators: Aching Pain Intervention(s): Monitored during session;Limited activity within patient's tolerance    Home Living                      Prior Function            PT Goals (current goals can now be found in the care plan section)      Frequency    Min 3X/week      PT Plan Current plan remains appropriate    Co-evaluation              AM-PAC PT "6 Clicks" Mobility   Outcome Measure   Help needed turning from your back to your side while in a flat bed without using bedrails?: None Help needed moving from lying on your back to sitting on the side of a flat bed without using bedrails?: A Little Help needed moving to and from a bed to a chair (including a wheelchair)?: A Little Help needed standing up from a chair using your arms (e.g., wheelchair or bedside chair)?: A Little Help needed to walk in hospital room?: A Lot Help needed climbing 3-5 steps with a railing? : A Lot 6 Click Score: 17    End of Session Equipment Utilized During Treatment: Gait belt;Oxygen Activity Tolerance: Patient tolerated treatment well;Patient limited by fatigue Patient left: in chair;with call bell/phone within reach;with chair alarm set;with family/visitor present Nurse Communication: Mobility status PT Visit Diagnosis: Unsteadiness on feet (R26.81);Other abnormalities of gait and mobility (R26.89);Muscle weakness (generalized) (M62.81)     Time: 2542-7062 PT Time Calculation (min) (ACUTE ONLY): 32 min  Charges:  $Therapeutic Activity: 23-37 mins                     [address] LPTA; CBIS [phone]  Aldona Lento 03/19/2019, 6:11 PM

## 2019-03-20 ENCOUNTER — Encounter (HOSPITAL_COMMUNITY): Admission: EM | Disposition: E | Payer: Self-pay | Source: Home / Self Care | Attending: Family Medicine

## 2019-03-20 ENCOUNTER — Inpatient Hospital Stay (HOSPITAL_COMMUNITY): Payer: Medicare HMO | Admitting: Anesthesiology

## 2019-03-20 DIAGNOSIS — E43 Unspecified severe protein-calorie malnutrition: Secondary | ICD-10-CM

## 2019-03-20 HISTORY — PX: ESOPHAGOGASTRODUODENOSCOPY: SHX5428

## 2019-03-20 HISTORY — PX: PEG PLACEMENT: SHX5437

## 2019-03-20 LAB — CULTURE, BLOOD (ROUTINE X 2)
Culture: NO GROWTH
Special Requests: ADEQUATE

## 2019-03-20 LAB — BASIC METABOLIC PANEL
Anion gap: 6 (ref 5–15)
BUN: 14 mg/dL (ref 8–23)
CO2: 29 mmol/L (ref 22–32)
Calcium: 8 mg/dL — ABNORMAL LOW (ref 8.9–10.3)
Chloride: 100 mmol/L (ref 98–111)
Creatinine, Ser: 0.53 mg/dL — ABNORMAL LOW (ref 0.61–1.24)
GFR calc Af Amer: >60 mL/min (ref >60)
GFR calc non Af Amer: >60 mL/min (ref >60)
Glucose, Bld: 145 mg/dL — ABNORMAL HIGH (ref 70–99)
Potassium: 3.9 mmol/L (ref 3.5–5.1)
Sodium: 135 mmol/L (ref 135–145)

## 2019-03-20 LAB — SURGICAL PCR SCREEN
MRSA, PCR: NEGATIVE
Staphylococcus aureus: NEGATIVE

## 2019-03-20 LAB — GLUCOSE, CAPILLARY
Glucose-Capillary: 148 mg/dL — ABNORMAL HIGH (ref 70–99)
Glucose-Capillary: 144 mg/dL — ABNORMAL HIGH (ref 70–99)
Glucose-Capillary: 182 mg/dL — ABNORMAL HIGH (ref 70–99)

## 2019-03-20 SURGERY — INSERTION, PEG TUBE
Anesthesia: Monitor Anesthesia Care

## 2019-03-20 SURGERY — INSERTION, PEG TUBE
Anesthesia: Choice

## 2019-03-20 MED ORDER — LIDOCAINE HCL (PF) 1 % IJ SOLN
INTRAMUSCULAR | Status: AC
  Filled 2019-03-20: qty 30

## 2019-03-20 MED ORDER — CEFAZOLIN SODIUM-DEXTROSE 2-4 GM/100ML-% IV SOLN
2.0000 g | INTRAVENOUS | Status: AC
  Administered 2019-03-20: 2 g via INTRAVENOUS

## 2019-03-20 MED ORDER — CHLORHEXIDINE GLUCONATE CLOTH 2 % EX PADS
6.0000 | MEDICATED_PAD | Freq: Once | CUTANEOUS | Status: AC
  Administered 2019-03-19: 6 via TOPICAL

## 2019-03-20 MED ORDER — LACTATED RINGERS IV SOLN
INTRAVENOUS | Status: DC
  Administered 2019-03-20: 10:00:00 via INTRAVENOUS

## 2019-03-20 MED ORDER — CEFAZOLIN SODIUM-DEXTROSE 2-4 GM/100ML-% IV SOLN
INTRAVENOUS | Status: AC
  Filled 2019-03-20: qty 100

## 2019-03-20 MED ORDER — PROPOFOL 500 MG/50ML IV EMUL
INTRAVENOUS | Status: DC | PRN
  Administered 2019-03-20: 100 ug/kg/min via INTRAVENOUS

## 2019-03-20 MED ORDER — HEPARIN SOD (PORK) LOCK FLUSH 100 UNIT/ML IV SOLN
250.0000 [IU] | INTRAVENOUS | Status: AC | PRN
  Administered 2019-03-20: 250 [IU]

## 2019-03-20 MED ORDER — KETAMINE HCL 50 MG/5ML IJ SOSY
PREFILLED_SYRINGE | INTRAMUSCULAR | Status: AC
  Filled 2019-03-20: qty 5

## 2019-03-20 MED ORDER — KETAMINE HCL 10 MG/ML IJ SOLN
INTRAMUSCULAR | Status: DC | PRN
  Administered 2019-03-20: 10 mg via INTRAVENOUS
  Administered 2019-03-20: 5 mg via INTRAVENOUS
  Administered 2019-03-20: 10 mg via INTRAVENOUS

## 2019-03-20 MED ORDER — LIDOCAINE HCL (PF) 1 % IJ SOLN
INTRAMUSCULAR | Status: DC | PRN
  Administered 2019-03-20: 5 mL

## 2019-03-20 MED ORDER — STERILE WATER FOR IRRIGATION IR SOLN
Status: DC | PRN
  Administered 2019-03-20: 1000 mL

## 2019-03-20 MED ORDER — HEPARIN SOD (PORK) LOCK FLUSH 100 UNIT/ML IV SOLN
INTRAVENOUS | Status: AC
  Filled 2019-03-20: qty 5

## 2019-03-20 MED ORDER — CHLORHEXIDINE GLUCONATE CLOTH 2 % EX PADS
6.0000 | MEDICATED_PAD | Freq: Once | CUTANEOUS | Status: AC
  Administered 2019-03-20: 6 via TOPICAL

## 2019-03-20 MED ORDER — PROPOFOL 10 MG/ML IV BOLUS
INTRAVENOUS | Status: AC
  Filled 2019-03-20: qty 20

## 2019-03-20 MED ORDER — PROPOFOL 10 MG/ML IV BOLUS
INTRAVENOUS | Status: DC | PRN
  Administered 2019-03-20: 20 mg via INTRAVENOUS

## 2019-03-20 MED ORDER — ALBUTEROL SULFATE (2.5 MG/3ML) 0.083% IN NEBU: 2.5000 mg | INHALATION_SOLUTION | Freq: Once | RESPIRATORY_TRACT | Status: DC

## 2019-03-20 SURGICAL SUPPLY — 11 items
BINDER ABDOMINAL 12 ML 46-62 (SOFTGOODS) ×2 IMPLANT
GLOVE BIO SURGEON STRL SZ7 (GLOVE) ×1 IMPLANT
GLOVE BIOGEL PI IND STRL 6.5 (GLOVE) ×1 IMPLANT
GLOVE BIOGEL PI INDICATOR 6.5 (GLOVE) ×1
GLOVE SURG SS PI 7.5 STRL IVOR (GLOVE) ×2 IMPLANT
KIT PEG SAFETY 20FR (KITS) ×2 IMPLANT
KIT TURNOVER KIT A (KITS) ×2 IMPLANT
MANIFOLD NEPTUNE II (INSTRUMENTS) ×2 IMPLANT
PAD ABD 5X9 TENDERSORB (GAUZE/BANDAGES/DRESSINGS) ×1 IMPLANT
SPONGE GAUZE 2X2 8PLY STRL LF (GAUZE/BANDAGES/DRESSINGS) ×1 IMPLANT
TAPE CLOTH SURG 4X10 WHT LF (GAUZE/BANDAGES/DRESSINGS) ×1 IMPLANT

## 2019-03-20 NOTE — Progress Notes (Signed)
Surgical consent not signed at this time. Pt states that he is "having second thoughts." Will continue to educate.

## 2019-03-20 NOTE — Progress Notes (Signed)
[facility]  PEG in place. Abdominal binder.   NPO except meds until 5pm, can restart diet and start feeds at 5pm; call nutrition to get them to place order for feeds.  Curlene Labrum, MD [facility] [address] [place], [place] [address] [phone] (office)

## 2019-03-20 NOTE — Interval H&P Note (Signed)
History and Physical Interval Note:  03/31/2019 10:06 AM  [name]  has presented today for surgery, with the diagnosis of Dysphagia and Malnutrition.  The various methods of treatment have been discussed with the patient and family. After consideration of risks, benefits and other options for treatment, the patient has consented to  Procedure(s): PERCUTANEOUS ENDOSCOPIC GASTROSTOMY (PEG) PLACEMENT (N/A) as a surgical intervention.  The patient's history has been reviewed, patient examined, no change in status, stable for surgery.  I have reviewed the patient's chart and labs.  Questions were answered to the patient's satisfaction.    PEG for failure to thrive, malnutrition, dysphagia, in the setting of [place]. Patient at extensive risk for multiple things including needing intubation, bleeding, infection, misplacement, have had a long a thorough conversation with the patient his family. Patient wants to proceed. Tube has to remain in for 6-8 weeks.    Virl Cagey

## 2019-03-20 NOTE — Anesthesia Procedure Notes (Signed)
Procedure Name: MAC Date/Time: 03/11/2019 10:28 AM Performed by: Vista Deck, CRNA Pre-anesthesia Checklist: Patient identified, Emergency Drugs available, Suction available, Timeout performed and Patient being monitored Patient Re-evaluated:Patient Re-evaluated prior to induction Oxygen Delivery Method: Nasal Cannula

## 2019-03-20 NOTE — Anesthesia Postprocedure Evaluation (Signed)
Anesthesia Post Note  Patient: CHANCELER PULLIN  Procedure(s) Performed: PERCUTANEOUS ENDOSCOPIC GASTROSTOMY (PEG) PLACEMENT (N/A ) ESOPHAGOGASTRODUODENOSCOPY (EGD) (N/A )  Patient location during evaluation: PACU Anesthesia Type: MAC Level of consciousness: awake and alert and patient cooperative Vital Signs Assessment: post-procedure vital signs reviewed and stable Respiratory status: spontaneous breathing and non-rebreather facemask Cardiovascular status: stable Postop Assessment: no apparent nausea or vomiting Anesthetic complications: no     Last Vitals:  Vitals:   03/14/2019 1144 03/09/2019 1145  BP:  131/75  Pulse: (!) 117 (!) 117  Resp: (!) 37 (!) 40  Temp:    SpO2: 93%     Last Pain:  Vitals:   04/04/2019 1013  TempSrc:   PainSc: 0-No pain                 Cletis Clack

## 2019-03-20 NOTE — Progress Notes (Signed)
[facility]  Patient with respiratory distress after PEG placement. On 15L non rebreather.  O2 sats 93 up from 80.  I have called and updated [name] and [name].   He is struggling to breathe and has a high likelihood of needing intubated. He is going to be transferred to the ICU and get BIPAP temporarily.  Discussed that the patient and wife made decision for intubation earlier in the week but no chest compressions.  Discussed that this was one of the worries/ complications from this procedure.  Updated Dr. Denton Brick who placed orders for ICU and respiratory.   IF PATIENT IS ON BIPAP - DO NOT START FEEDS OR DIET.  G TUBE NEEDS TO GO GRAVITY NOW THAT HE IS GOING ON BIPAP.   Curlene Labrum, MD [facility] [address] [place], [place] [address] [phone] (office)

## 2019-03-20 NOTE — NC FL2 (Signed)
[place] LEVEL OF CARE SCREENING TOOL     IDENTIFICATION  Patient Name: [name] Birthdate: Jul 22, 1941 Sex: male Admission Date (Current Location): 03/18/2019  [facility] and [place] Number:  [employer] and Address:  [place] [address], [place]      Provider Number: [id number]  Attending Physician Name and Address:  Roxan Hockey, MD  Relative Name and Phone Number:  Hayato Guaman - wife  [phone]    Current Level of Care: Hospital Recommended Level of Care: [place] Prior Approval Number:    Date Approved/Denied:   PASRR Number: [id number]  Discharge Plan: SNF    Current Diagnoses: Patient Active Problem List   Diagnosis Date Noted  . Oropharyngeal dysphagia   . Palliative care by specialist   . Pneumonia- HCAP Vs Aspiration Related with Hypoxia and Hypotension 03/15/2019  . Acute Hypoxic respiratory failure  due to PNA 03/15/2019  . PSVT (paroxysmal supraventricular tachycardia) ([place]) 03/15/2019  . Hypotension 03/15/2019  . Goals of care, counseling/discussion 03/15/2019  . Orthostatic dizziness 03/14/2019  . Protein-calorie malnutrition, severe 03/11/2019  . Dehydration   . Failure to thrive in adult 03/25/2019  . Nausea in adult patient 03/09/2019  . Closed fracture of left proximal humerus 11/24/18 02/05/2019  . Elevated liver function tests   . Cancer, hepatocellular ([place]) 11/24/2018  . Anorexia   . Calculus of gallbladder without cholecystitis without obstruction   . Dilated pancreatic duct   . Liver lesion   . Pancreatitis 11/11/2018  . AKI (acute kidney injury) ([place]) 11/11/2018  . Diabetes mellitus type 2, uncomplicated ([place]) 09/62/8366  . Osteoporosis 11/15/2015  . Osteopenia determined by x-ray 04/12/2015  . 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  . Purpura senilis ([place]) 12/29/2014  . Carotid artery stenosis 09/02/2014  . Cardiomyopathy, dilated, nonischemic ([place])  04/18/2014  . Postoperative atrial fibrillation ([place]) 04/18/2014  . 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  . Aortic stenosis 04/08/2014  . Chronic systolic heart failure ([place]) 04/08/2014  . Unstable angina ([place]) 04/08/2014  . Chest pain 04/08/2014  . Personal history of colonic polyps 11/26/2013  . Loss of weight 11/02/2013  . Aortic valve disorder 07/11/2010  . LBBB 07/11/2010  . Bilateral carotid bruits 07/11/2010  . NEOPLASM OF UNCERTAIN BEHAVIOR OF SKIN 09/27/2008  . BENIGN PROSTATIC HYPERTROPHY, WITH OBSTRUCTION 09/27/2008  . KNEE PAIN 09/27/2008  . FLATULENCE ERUCTATION AND GAS PAIN 04/06/2008  . ALLERGIC RHINITIS 01/09/2008  . Constipation 10/17/2007  . HEADACHE 07/25/2007  . LEG CRAMPS 02/13/2007  . DYSPNEA ON EXERTION 01/10/2007  . Hyperlipidemia 10/10/2006  . Essential hypertension 10/10/2006  . GERD 10/10/2006  . CARDIAC MURMUR 10/10/2006  . CHEST PAIN, EXERTIONAL 10/10/2006    Orientation RESPIRATION BLADDER Height & Weight     Self, Time, Situation, Place  Normal Incontinent Weight: 130 lb 8.2 oz (59.2 kg) Height:  5\' 8"  (172.7 cm)  BEHAVIORAL SYMPTOMS/MOOD NEUROLOGICAL BOWEL NUTRITION STATUS      Incontinent Feeding tube(gtube)  AMBULATORY STATUS COMMUNICATION OF NEEDS Skin   Limited Assist Verbally Normal                       Personal Care Assistance Level of Assistance  Bathing, Feeding, Dressing Bathing Assistance: Limited assistance Feeding assistance: Limited assistance Dressing Assistance: Limited assistance     Functional Limitations Info  Sight, Hearing, Speech Sight Info: Adequate Hearing Info: Adequate Speech Info: Adequate    SPECIAL CARE FACTORS FREQUENCY  PT (By  licensed PT)     PT Frequency: PT 5 times a week              Contractures Contractures Info: Not present    Additional Factors Info  Code Status, Allergies Code Status Info: Partial Allergies Info: Actos, Metformin, Morphine,statins            Current Medications (03/27/2019):  This is the current hospital active medication list Current Facility-Administered Medications  Medication Dose Route Frequency Provider Last Rate Last Dose  . albuterol (PROVENTIL) (2.5 MG/3ML) 0.083% nebulizer solution 2.5 mg  2.5 mg Nebulization TID Denton Brick, Courage, MD   2.5 mg at 03/07/2019 1324  . albuterol (PROVENTIL) (2.5 MG/3ML) 0.083% nebulizer solution 2.5 mg  2.5 mg Nebulization Once Emokpae, Courage, MD      . aspirin EC tablet 81 mg  81 mg Oral Daily Elgergawy, Silver Huguenin, MD   81 mg at 03/19/19 1000  . cholecalciferol (VITAMIN D3) tablet 2,000 Units  2,000 Units Oral Daily Elgergawy, Silver Huguenin, MD   2,000 Units at 03/19/19 1000  . dextrose 5 % and 0.45 % NaCl with KCl 20 mEq/L infusion   Intravenous Continuous Denton Brick, Courage, MD 75 mL/hr at 03/09/2019 1247    . digoxin (LANOXIN) tablet 0.125 mg  0.125 mg Oral Daily Emokpae, Courage, MD   0.125 mg at 03/19/19 1000  . dronabinol (MARINOL) capsule 5 mg  5 mg Oral BID AC Emokpae, Courage, MD   5 mg at 03/19/19 1753  . feeding supplement (ENSURE ENLIVE) (ENSURE ENLIVE) liquid 237 mL  237 mL Oral BID BM Kathie Dike, MD   237 mL at 03/19/19 1335  . guaiFENesin (ROBITUSSIN) 100 MG/5ML solution 300 mg  15 mL Oral QID Roxan Hockey, MD   300 mg at 03/19/19 2148  . guaiFENesin-dextromethorphan (ROBITUSSIN DM) 100-10 MG/5ML syrup 5 mL  5 mL Oral Q4H PRN Denton Brick, Courage, MD   5 mL at 03/18/19 0919  . [START ON 03/21/2019] heparin injection 5,000 Units  5,000 Units Subcutaneous Q8H Emokpae, Courage, MD      . HYDROmorphone (DILAUDID) injection 0.5 mg  0.5 mg Intravenous Q4H PRN Elgergawy, Silver Huguenin, MD   0.5 mg at 03/16/2019 1302  . insulin aspart (novoLOG) injection 0-5 Units  0-5 Units Subcutaneous QHS Emokpae, Courage, MD      . insulin aspart (novoLOG) injection 0-6 Units  0-6 Units Subcutaneous TID WC Emokpae, Courage, MD      . Derrill Memo ON 03/21/2019] insulin glargine (LANTUS) injection 12 Units  12 Units  Subcutaneous Daily Emokpae, Courage, MD      . Derrill Memo ON 03/21/2019] lactulose (CHRONULAC) 10 GM/15ML solution 20 g  20 g Per Tube Daily Emokpae, Courage, MD      . meclizine (ANTIVERT) tablet 12.5 mg  12.5 mg Oral TID PRN Elgergawy, Silver Huguenin, MD      . MEDLINE mouth rinse  15 mL Mouth Rinse BID Emokpae, Courage, MD   15 mL at 03/21/2019 0940  . omega-3 acid ethyl esters (LOVAZA) capsule 1 g  1 g Oral QPM Elgergawy, Silver Huguenin, MD   1 g at 03/19/19 1753  . ondansetron (ZOFRAN) injection 4 mg  4 mg Intravenous Q6H PRN Roxan Hockey, MD   4 mg at 03/17/19 1637  . oxyCODONE (Oxy IR/ROXICODONE) immediate release tablet 5 mg  5 mg Oral Q4H PRN Elgergawy, Silver Huguenin, MD   5 mg at 03/14/19 1322  . pantoprazole sodium (PROTONIX) 40 mg/20 mL oral suspension 40 mg  40 mg  Oral BID AC Emokpae, Courage, MD   40 mg at 03/19/19 1752  . piperacillin-tazobactam (ZOSYN) IVPB 3.375 g  3.375 g Intravenous Q8H Coffee, Donna Christen, RPH 12.5 mL/hr at 03/24/2019 1458 3.375 g at 03/17/2019 1458  . potassium chloride 20 MEQ/15ML (10%) solution 10 mEq  10 mEq Oral BID Roxan Hockey, MD   10 mEq at 03/19/19 2148     Discharge Medications: Please see discharge summary for a list of discharge medications.  Relevant Imaging Results:  Relevant Lab Results:   Additional Information SS#  [id number]  Shade Flood, LCSW

## 2019-03-20 NOTE — Progress Notes (Signed)
PT Cancellation Note  Patient Details Name: [name] MRN: [id number] DOB: November 18, 1940   Cancelled Treatment:    Reason Eval/Treat Not Completed: Medical issues which prohibited therapy.  Patient transferred to a higher level of care and will need new PT consult resume therapy when patient is medically stable.  Thank you.   3:45 PM, 03/09/2019 Lonell Grandchild, MPT Physical Therapist with [facility] [phone] office [phone] mobile phone

## 2019-03-20 NOTE — Anesthesia Procedure Notes (Signed)
Procedure Name: MAC Date/Time: 03/24/2019 10:34 AM Performed by: Vista Deck, CRNA Pre-anesthesia Checklist: Patient identified, Emergency Drugs available, Suction available, Timeout performed and Patient being monitored Patient Re-evaluated:Patient Re-evaluated prior to induction Oxygen Delivery Method: Non-rebreather mask

## 2019-03-20 NOTE — Anesthesia Preprocedure Evaluation (Addendum)
Anesthesia Evaluation  Patient identified by MRN, date of birth, ID band Patient awake    Reviewed: Allergy & Precautions, NPO status , Patient's Chart, lab work & pertinent test results  Airway Mallampati: II  TM Distance: >3 FB Neck ROM: Full    Dental no notable dental hx.    Pulmonary pneumonia, former smoker,  Continues on o2 , appears winded    Pulmonary exam normal breath sounds clear to auscultation       Cardiovascular Exercise Tolerance: Poor hypertension, Pt. on medications + angina + CAD and + Peripheral Vascular Disease  Normal cardiovascular exam Rhythm:Regular Rate:Normal  LBBB onECG, recent Echo essentially normal -here for palliative procedure    Neuro/Psych  Headaches, negative psych ROS   GI/Hepatic Neg liver ROS, GERD  ,Known hepatocellular Ca   Endo/Other  negative endocrine ROSdiabetes  Renal/GU Renal InsufficiencyRenal disease  negative genitourinary   Musculoskeletal negative musculoskeletal ROS (+)   Abdominal   Peds negative pediatric ROS (+)  Hematology negative hematology ROS (+)   Anesthesia Other Findings   Reproductive/Obstetrics negative OB ROS                             Anesthesia Physical Anesthesia Plan  ASA: IV  Anesthesia Plan: MAC and General   Post-op Pain Management:    Induction:   PONV Risk Score and Plan: 2 and Propofol infusion and TIVA  Airway Management Planned: Nasal Cannula and Simple Face Mask  Additional Equipment:   Intra-op Plan:   Post-operative Plan:   Informed Consent:   Patient has DNR.  Suspend DNR.     Plan Discussed with:   Anesthesia Plan Comments: (Plan Full PPE use  Plan TIVA vs GETA as needed  D/w pt if ETT needed ,may need post op ventilation  D/w pt with RNs about rescinding DNR in periop period  States wishes to proceed, although seemed apprehensive with this decision   D/w wife DNR status and  the plan to rescind the DNR perioperatively , Voiced understanding and WTP Understood if he comes upstairs on a ventilator , they can reinstate later )       Anesthesia Quick Evaluation

## 2019-03-20 NOTE — Progress Notes (Signed)
Initial Nutrition Assessment  DOCUMENTATION CODES:   Severe malnutrition in context of chronic illness  INTERVENTION:  Monitor magnesium, potassium, and phosphorus daily for at least 3 days, MD to replete as needed, as pt is at risk for refeeding syndrome given patient po intake <50% of estimated needs since admission  When appropriate for initiation of enteral feeding recommend: -Start Osmolite 1.5 @ 20 ml/hr via PEG; remain at 20 ml/hr for 24 hrs  -Advance 10 ml every 12 hrs to goal rate of 50 ml/hr -Pro-stat BID via PEG  Tube feed regimen at goal rate rate provides 2000 kcal, 105 grams protein, 912 ml water  -Water flushes per MD   NUTRITION DIAGNOSIS:   Severe Malnutrition related to cancer and cancer related treatments as evidenced by per patient/family report, energy intake < or equal to 75% for > or equal to 1 month, percent weight loss.  Ongoing  GOAL:   Patient will meet greater than or equal to 90% of their needs  Ongoing; providing recommendations for enteral feedings  MONITOR:   Diet advancement, Supplement acceptance, Labs, Weight trends, PO intake  REASON FOR ASSESSMENT:   Consult Enteral/tube feeding initiation and management  ASSESSMENT:   Patient is a 78 yo male with hx of DM, GERD, Constipation, CAD, HTN, Vitamin D deficiency and hepatocellular carcinoma (followed at [facility]).  RD consulted for recommendations for initiation of enteral feeding once patient comes off of BiPAP Patient developed hypoxia with O2 stats in the low 80s after PEG placed today requiring continuous BiPAP.  Prior to procedure patient was taking an appetite stimulant. MBSS 8/11 and diet advanced pureed textures with thin liquids. Meal intake documented 10-40% which is not meeting his nutrition needs (noted below). Patient is at risk for refeeding considering poor intake; gradual advancement of enteral feedings to goal rate recommended.   Current wt 59.2 kg (130.2 lb) UBW 120 lb  (pt reported to RD on 8/12)  Medications reviewed and include: vitamin D3, Marinol, SSI, Lantus 12 units daily, lactulose, lovaza, protonix, potassium chloride 10 mEq, zosyn  Labs: CBGS 77-148   Diet Order:   Diet Order            Diet NPO time specified Except for: Sips with Meds  Diet effective midnight        Diet - low sodium heart healthy              EDUCATION NEEDS:   Education needs have been addressed  Skin:  Skin Assessment: Skin Integrity Issues: Skin Integrity Issues:: Stage I Stage I: sacrum  Last BM:  8/4  Height:   Ht Readings from Last 1 Encounters:  03/15/2019 5\' 8"  (1.727 m)    Weight:   Wt Readings from Last 1 Encounters:  03/17/19 59.2 kg    Ideal Body Weight:  70 kg  BMI:  Body mass index is 19.84 kg/m.  Estimated Nutritional Needs: Based on UBW 54.5kg  Kcal:  1900-2100 (35-40 kcal/kg)  Protein:  95-105  Fluid:  1500 ml daily   Lajuan Lines, RD, LDN [employer] Telephone [phone] After Hours/Weekend Pager: [phone]

## 2019-03-20 NOTE — Transfer of Care (Signed)
Immediate Anesthesia Transfer of Care Note  Patient: [name]  Procedure(s) Performed: PERCUTANEOUS ENDOSCOPIC GASTROSTOMY (PEG) PLACEMENT (N/A )  Patient Location: PACU  Anesthesia Type:MAC  Level of Consciousness: awake, alert  and patient cooperative  Airway & Oxygen Therapy: Patient Spontanous Breathing, Patient connected to nasal cannula oxygen and non-rebreather face mask  Post-op Assessment: Report given to RN and Post -op Vital signs reviewed and stable  Post vital signs: Reviewed and stable  Last Vitals:  Vitals Value Taken Time  BP 113/83   Temp    Pulse 113 03/25/2019 1109  Resp 31 03/17/2019 1109  SpO2 92 % 04/02/2019 1109  Vitals shown include unvalidated device data.  Last Pain:  Vitals:   03/17/2019 1013  TempSrc:   PainSc: 0-No pain      Patients Stated Pain Goal: 1 (81/10/31 5945)  Complications: No apparent anesthesia complications

## 2019-03-20 NOTE — Progress Notes (Signed)
[facility]   On bipap. looking better. Oriented X 4. Patient and wife in room. Patient now does not want to be intubated. Discussed that after Bipap that is the next step. He does not want this or chest compressions. Discussed that without those two things he is a DNR and would not get shocked or ACLS drugs. They agreed.  Patient made DNR. Bipap for now, hopefully will turn around. PEG to gravity while on BIpap, no feeds until after off.  Curlene Labrum, MD [facility] [address] [place], [place] [address] [phone] (office)

## 2019-03-20 NOTE — Discharge Instructions (Signed)
PEG Care (Feeding Tube Care) -Flush the tube twice a day with 20cc of water from the tap.  -Make sure the top of your bumper remains at 3. It is important to keep it at this mark so that the tube is not too tight or too loose.  -Keep the tube taped down when not in use or with activity. -Wear an abdominal binder, or ace wrap around your waste to keep the tube in place, especially with sleeping.  -Follow your feeding instructions per your home health care RN.  -If your tube falls out or gets pulled out, go to the ED immediately. If it is within 6-8 weeks, there is a possibility that you will need surgery.   After 6-8 weeks, a new tube can be reinserted, but has to be done quickly because the hole heals up fast.  -You may shower and dry the area and tube off after showering. DO NOT SUBMERGE IN A BATH.   PEG Tube Home Guide A PEG tube is used to put food and fluids into the stomach. Before you leave the hospital, make sure that you know:  How to care for your PEG tube.  How to care for the opening (stoma) in your belly.  How to give yourself a feeding.  How to give yourself medicines.  When to call your doctor for help. Supplies needed:  Soapy water.  Clean, plain water.  Clean washcloth.  Bandage (dressing). This is optional.  Syringe. How to care for a PEG tube Check your PEG tube every day. Make sure:  It is not too tight.  It is in the correct place. There is a mark on the tube that shows when the tube is in the correct place. Adjust the tube if you need to. Cleaning your stoma Clean your stoma every day. Follow these steps: 1. Wash your hands with soap and water. If soap and water are not available, use hand sanitizer. 2. Check your stoma. Let your doctor know if there is: ? Redness. ? Leaking. ? Skin irritation. 3. Wash the stoma gently with warm, soapy water. 4. Rinse the stoma with plain water. 5. Pat the stoma area dry. 6. You may place a bandage around the  opening of your stoma as told by your doctor.  Giving a feeding Your doctor will tell you:  How much nutrition and fluid you will need for each feeding.  How often to have a feeding.  Whether you should take medicine in the tube by itself or with a feeding. To give yourself a feeding, follow these steps: 1. Lay out all of the things that you will need. 2. Make sure that the nutritional formula is at room temperature. 3. Wash your hands with soap and water. 4. Sit up or stand up straight. You will need to stay sitting up or standing up while you give yourself a feeding. 5. Make sure the syringe plunger is pushed in. Place the tip of the syringe in clean water, and slowly pull the plunger to bring (draw up) the water into the syringe. 6. Remove the clamp and the cap from the PEG tube. 7. Push the water out of the syringe to clean (flush) the tube. 8. If the tube is clear, draw up the formula into the syringe. Make sure to use the right amount for each feeding. Add water if you need to. 9. Slowly push the formula from the syringe through the tube. 10. After the feeding, flush the tube with  water. 11. Put the clamp and the cap on the tube. 12. Stay sitting up or standing up straight for at least 30 minutes. Giving medicine To give yourself medicine, follow these steps: 1. Lay out all of the things that you will need. 2. If your medicine is in tablet form, crush it and dissolve it in water. 3. Wash your hands with soap and water. 4. Sit up or stand up straight. You will need to stay sitting up or standing up while you give yourself medicine. 5. Make sure the syringe plunger is pushed in. Place the tip of the syringe in clean water, and slowly pull the plunger to bring (draw up) the water into the syringe. 6. Remove the clamp and the cap from the PEG tube. 7. Push the water out of the syringe to clean (flush) the tube. 8. If the tube is clear, draw up the medicine into the  syringe. 9. Slowly push the medicine from the syringe through the tube. 10. Flush the tube with water. 11. Put the clamp and the cap on the tube. 12. Stay sitting up or standing up straight for at least 30 minutes. Do not take sustained release (SR) medicines through your tube. If you are not sure if your medicine is an SR medicine, ask your doctor. Contact a doctor if:  The area around your stoma is sore, irritated, or red.  You have belly pain or bloating while you are feeding or after you feed.  You feel sick to your stomach (nauseous) for a long time.  You have trouble pooping (constipation) or you have watery poop (diarrhea) for a long time.  You have a fever.  You have problems with your PEG tube. Get help right away if:  Your tube is blocked.  Your tube falls out.  You have pain around your tube.  You are bleeding from your tube.  Your tube is leaking.  You choke or you have trouble breathing while you are feeding or after you feed. Summary  A PEG tube is used to put food and fluids into the stomach.  Before you leave the hospital, you will be taught how to use and care for your PEG tube.  Your doctor will give you instructions on how to give yourself feedings and medicines.  Contact your doctor if you have fever or soreness, redness, or irritation around your stoma.  Get help right away if your tube leaks, is blocked, or falls out. Also, get help right away if you have pain or bleeding around your stoma. This information is not intended to replace advice given to you by your health care provider. Make sure you discuss any questions you have with your health care provider. Document Released: 08/25/2010 Document Revised: 10/09/2018 Document Reviewed: 08/05/2017 Elsevier Patient Education  2020 [employer].

## 2019-03-20 NOTE — Progress Notes (Signed)
[facility]  Palliative peg. Patient wants to proceed.  Jose Labrum, MD [facility] [address] [place], [place] [address] [phone] (office)

## 2019-03-20 NOTE — Care Management Important Message (Signed)
Important Message  Patient Details  Name: JERMOND BURKEMPER MRN: [id number] Date of Birth: 1941/02/20   Medicare Important Message Given:  Yes     Ihor Gully, LCSW 03/08/2019, 3:00 PM

## 2019-03-20 NOTE — Progress Notes (Signed)
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 Oral - Pudding Cup -- Oral - Honey Teaspoon -- Oral - Honey Cup -- Oral - Nectar Teaspoon -- Oral - Nectar Cup -- Oral - Nectar Straw -- Oral - Thin Teaspoon Weak lingual manipulation;Piecemeal swallowing;Decreased bolus cohesion Oral - Thin Cup Weak lingual manipulation;Piecemeal swallowing;Decreased bolus cohesion Oral - Thin Straw Lingual/palatal residue Oral - Puree Weak lingual manipulation;Reduced posterior propulsion;Lingual/palatal residue;Piecemeal swallowing;Delayed oral transit;Decreased bolus cohesion Oral - Mech Soft Weak lingual manipulation;Reduced posterior propulsion;Lingual/palatal residue;Piecemeal swallowing;Delayed oral transit;Decreased bolus cohesion Oral - Regular -- Oral - Multi-Consistency -- Oral - Pill Reduced posterior propulsion Oral Phase - Comment --  CHL IP PHARYNGEAL PHASE 03/17/2019 Pharyngeal Phase Impaired Pharyngeal- Pudding Teaspoon -- Pharyngeal -- Pharyngeal- Pudding Cup -- Pharyngeal -- Pharyngeal-  Honey Teaspoon -- Pharyngeal -- Pharyngeal- Honey Cup -- Pharyngeal -- Pharyngeal- Nectar Teaspoon -- Pharyngeal -- Pharyngeal- Nectar Cup -- Pharyngeal -- Pharyngeal- Nectar Straw -- Pharyngeal -- Pharyngeal- Thin Teaspoon Delayed swallow initiation-vallecula;Reduced epiglottic inversion;Pharyngeal residue - valleculae;Reduced tongue base retraction Pharyngeal -- Pharyngeal- Thin Cup Delayed swallow initiation-vallecula;Reduced epiglottic inversion;Pharyngeal residue - valleculae;Reduced tongue base retraction Pharyngeal -- Pharyngeal- Thin Straw Pharyngeal residue - valleculae;Reduced tongue base retraction;Delayed swallow initiation-vallecula Pharyngeal -- Pharyngeal- Puree Delayed swallow initiation-vallecula;Reduced pharyngeal  peristalsis;Reduced epiglottic inversion;Reduced tongue base retraction;Pharyngeal residue - valleculae;Pharyngeal residue - posterior pharnyx;Pharyngeal residue - pyriform;Lateral channel residue Pharyngeal -- Pharyngeal- Mechanical Soft Delayed swallow initiation-vallecula;Reduced pharyngeal peristalsis;Reduced epiglottic inversion;Reduced tongue base retraction;Pharyngeal residue - valleculae;Pharyngeal residue - posterior pharnyx;Pharyngeal residue - pyriform;Lateral channel residue Pharyngeal -- Pharyngeal- Regular -- Pharyngeal -- Pharyngeal- Multi-consistency -- Pharyngeal -- Pharyngeal- Pill (No Data) Pharyngeal -- Pharyngeal Comment --  CHL IP CERVICAL ESOPHAGEAL PHASE 03/17/2019 Cervical Esophageal Phase WFL Pudding Teaspoon -- Pudding Cup -- Honey Teaspoon -- Honey Cup -- Nectar Teaspoon -- Nectar Cup -- Nectar Straw -- Thin Teaspoon -- Thin Cup -- Thin Straw -- Puree -- Mechanical Soft -- Regular -- Multi-consistency -- Pill -- Cervical Esophageal Comment -- Thank you, Genene Churn, [place] 03/17/2019, 1:42 PM              [place] Ekg Site Rite  Result Date: 03/15/2019 If Site Rite image not attached, placement could not be confirmed due to current  cardiac rhythm.    CBC Recent Labs  Lab 03/15/19 0436 03/15/19 2046 03/16/19 0408 03/17/19 0846 03/19/19 0406  WBC 8.1  --  5.9 3.3* 4.7  HGB 11.4* 10.0* 9.7* 8.1* 10.1*  HCT 36.7* 31.1* 30.6* 25.7* 31.0*  PLT 96*  --  85* 55* 70*  MCV 95.1  --  93.0 92.8 89.9  MCH 29.5  --  29.5 29.2 29.3  MCHC 31.1  --  31.7 31.5 32.6  RDW 15.2  --  14.9 15.1 15.4    Chemistries  Recent Labs  Lab 03/14/19 1554 03/15/19 0436 03/16/19 0408 03/18/19 0418 03/19/19 0406 03/22/2019 0506  NA 135 134* 133* 135 136 135  K 3.6 4.9 3.8 2.8* 3.4* 3.9  CL 105 104 104 101 101 100  CO2 22 22 25 28 29 29   GLUCOSE 65* 231* 208* 56* 54* 145*  BUN 15 17 13 9 11 14   CREATININE 0.80 0.64 0.43* 0.46* 0.50* 0.53*  CALCIUM 7.9* 8.1* 7.6* 8.1* 8.2* 8.0*  MG 1.8  --   --   --  1.9  --   AST  --   --  40  --   --   --   ALT  --   --  54*  --   --   --   ALKPHOS  --   --  283*  --   --   --   BILITOT  --   --  1.1  --   --   --    ------------------------------------------------------------------------------------------------------------------ No results for input(s): CHOL, HDL, LDLCALC, TRIG, CHOLHDL, LDLDIRECT in the last 72 hours.  Lab Results  Component Value Date   HGBA1C 7.8 (H) 03/18/2019   ------------------------------------------------------------------------------------------------------------------ No results for input(s): TSH, T4TOTAL, T3FREE, THYROIDAB in the last 72 hours.  Invalid input(s): FREET3 ------------------------------------------------------------------------------------------------------------------ No results for input(s): VITAMINB12, FOLATE, FERRITIN, TIBC, IRON, RETICCTPCT in the last 72 hours.  Coagulation profile Recent Labs  Lab 03/18/19 1618  INR 1.6*    No results for input(s): DDIMER in the last 72 hours.  Cardiac Enzymes No results for input(s): CKMB, TROPONINI, MYOGLOBIN in the last 168 hours.  Invalid input(s):  CK ------------------------------------------------------------------------------------------------------------------ No results found for: BNP   Roxan Hockey M.D on 03/09/2019 at 1:59 PM  Go to [email or web address] - for contact info  [facility] - Office  [phone]

## 2019-03-20 NOTE — Op Note (Signed)
[facility] Operative Note  03/18/2019  Preoperative Diagnosis:  Dysphagia, Failure to thrive, Protein malnutrition, Hepatocellular cancer    Postoperative Diagnosis: Same   Procedure(s) Performed: Palliative Percutaneous Endoscopic Gastrostomy Tube   Surgeon: Lanell Matar. Constance Haw, MD   Assistants: Aviva Signs, MD     Anesthesia: Monitored anesthesia care   Anesthesiologist: Lenice Llamas, MD    Estimated Blood Loss: Minimal   Complications: None   Wound Class: Clean contaminated   Operative Indications: The patient is a 77 year old male with multiple issues in the setting of hepatocellular cancer that is unable to eat and drink sufficiently to keep up his weight or energy level .  He ultimately needs prolonged enteral nutrition because he cannot keep up with his needs and continues to become more cachetic and malnourished.  Given this prolonged need a percutaneous endoscopic gastrostomy tube was chosen. We discussed the risk and benefits of the procedure including but not limited to bleeding, infection, risk of injury to the esophagus or stomach, and need for possible open gastrostomy tube.   Findings:  Normal anatomy    Procedure: The patient was taken to the operating room and placed supine. Monitored anesthesia care was induced. Intravenous antibiotics were administered per protocol.  A JACHO approved time out was performed.  After adequate sedation and positioning, a mouth piece was placed into the patient's mouth and the endoscope was passed down into the stomach. No abnormalities were noted.  The duodenum was intubated and noted to have no gross abnormalities. The stomach was insufflated with air and the endoscope was positioned in the midportion and directed to the anterior abdominal wall. With the room darkened, a good light reflex was noted in the left upper quadrant.  Finger pressure was applied at the light reflex with adequate indentation on the stomach  wall on endoscopy. A snare was passed into the stomach, opened fully, and positioned to encircle the point of demonstrated finger indentation.   The skin was prepped, and the area was anesthetized with lidocaine and a 1cm incision was made.  The introducer needle was passed through the incision into the stomach under direct visualization with the gastroscope. The catheter was encircled with the snare, and the guidewire was passed and captured by the snare. The endoscope, snare, and guidewire were the withdrawn and pulled back into the mouth. The gastrostomy tube was attached to the loop of the guidewire and the whole thing was pulled back into the stomach. The gastrostomy tube was noted to be at the 2.5cm mark at the skin level and at the 3cm mark at the top of the bumper.  The gastroscope was reintroduced and adequate placement was confirmed and a picture was taken. The gastrostomy tube was cute to length and the bumper and clamping appendage were placed.  The patient tolerated the procedure well and was taken to the PACU in good condition.  A binder was placed to secure the PEG.    Dr. Arnoldo Morale was assisted with the incision and abdominal portion of the procedure.     Curlene Labrum, MD [facility] [address] [place], [place] [address] [phone] (office)

## 2019-03-20 NOTE — NC FL2 (Signed)
[place] LEVEL OF CARE SCREENING TOOL     IDENTIFICATION  Patient Name: [name] Birthdate: [birth year] Sex: male Admission Date (Current Location): 03/20/2019  [facility] and [place] Number:  [employer] and Address:  [place] [address], [place]      Provider Number: [id number]  Attending Physician Name and Address:  Roxan Hockey, MD  Relative Name and Phone Number:  Joncarlo Friberg - wife  [phone]    Current Level of Care: Hospital Recommended Level of Care: [place] Prior Approval Number:    Date Approved/Denied:   PASRR Number: [id number]  Discharge Plan: SNF    Current Diagnoses: Patient Active Problem List   Diagnosis Date Noted  . Oropharyngeal dysphagia   . Palliative care by specialist   . Pneumonia- HCAP Vs Aspiration Related with Hypoxia and Hypotension 03/15/2019  . Acute Hypoxic respiratory failure  due to PNA 03/15/2019  . PSVT (paroxysmal supraventricular tachycardia) ([place]) 03/15/2019  . Hypotension 03/15/2019  . Goals of care, counseling/discussion 03/15/2019  . Orthostatic dizziness 03/14/2019  . Protein-calorie malnutrition, severe 03/11/2019  . Dehydration   . Failure to thrive in adult 03/27/2019  . Nausea in adult patient 03/27/2019  . Closed fracture of left proximal humerus 11/24/18 02/05/2019  . Elevated liver function tests   . Cancer, hepatocellular ([place]) 11/24/2018  . Anorexia   . Calculus of gallbladder without cholecystitis without obstruction   . Dilated pancreatic duct   . Liver lesion   . Pancreatitis 11/11/2018  . AKI (acute kidney injury) ([place]) 11/11/2018  . Diabetes mellitus type 2, uncomplicated ([place]) 80/32/1224  . Osteoporosis 11/15/2015  . Osteopenia determined by x-ray 04/12/2015  . 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  . Purpura senilis ([place]) 12/29/2014  . Carotid artery stenosis 09/02/2014  . Cardiomyopathy, dilated, nonischemic ([place])  04/18/2014  . Postoperative atrial fibrillation ([place]) 04/18/2014  . 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  . Aortic stenosis 04/08/2014  . Chronic systolic heart failure ([place]) 04/08/2014  . Unstable angina ([place]) 04/08/2014  . Chest pain 04/08/2014  . Personal history of colonic polyps 11/26/2013  . Loss of weight 11/02/2013  . Aortic valve disorder 07/11/2010  . LBBB 07/11/2010  . Bilateral carotid bruits 07/11/2010  . NEOPLASM OF UNCERTAIN BEHAVIOR OF SKIN 09/27/2008  . BENIGN PROSTATIC HYPERTROPHY, WITH OBSTRUCTION 09/27/2008  . KNEE PAIN 09/27/2008  . FLATULENCE ERUCTATION AND GAS PAIN 04/06/2008  . ALLERGIC RHINITIS 01/09/2008  . Constipation 10/17/2007  . HEADACHE 07/25/2007  . LEG CRAMPS 02/13/2007  . DYSPNEA ON EXERTION 01/10/2007  . Hyperlipidemia 10/10/2006  . Essential hypertension 10/10/2006  . GERD 10/10/2006  . CARDIAC MURMUR 10/10/2006  . CHEST PAIN, EXERTIONAL 10/10/2006    Orientation RESPIRATION BLADDER Height & Weight     Self, Time, Situation, Place  Normal Incontinent Weight: 130 lb 8.2 oz (59.2 kg) Height:  5\' 8"  (172.7 cm)  BEHAVIORAL SYMPTOMS/MOOD NEUROLOGICAL BOWEL NUTRITION STATUS      Incontinent Feeding tube(gtube)  AMBULATORY STATUS COMMUNICATION OF NEEDS Skin   Limited Assist Verbally Normal                       Personal Care Assistance Level of Assistance  Bathing, Feeding, Dressing Bathing Assistance: Limited assistance Feeding assistance: Limited assistance Dressing Assistance: Limited assistance     Functional Limitations Info  Sight, Hearing, Speech Sight Info: Adequate Hearing Info: Adequate Speech Info: Adequate    SPECIAL CARE FACTORS FREQUENCY  PT (By  licensed PT)     PT Frequency: PT 5 times a week              Contractures Contractures Info: Not present    Additional Factors Info  Code Status, Allergies Code Status Info: DNR Allergies Info: Actos, Metformin, Morphine,statins            Current Medications (03/07/2019):  This is the current hospital active medication list Current Facility-Administered Medications  Medication Dose Route Frequency Provider Last Rate Last Dose  . albuterol (PROVENTIL) (2.5 MG/3ML) 0.083% nebulizer solution 2.5 mg  2.5 mg Nebulization TID Virl Cagey, MD   2.5 mg at 04/04/2019 1929  . albuterol (PROVENTIL) (2.5 MG/3ML) 0.083% nebulizer solution 2.5 mg  2.5 mg Nebulization Once Virl Cagey, MD      . aspirin EC tablet 81 mg  81 mg Oral Daily Virl Cagey, MD   81 mg at 03/19/19 1000  . cholecalciferol (VITAMIN D3) tablet 2,000 Units  2,000 Units Oral Daily Virl Cagey, MD   2,000 Units at 03/19/19 1000  . dextrose 5 % and 0.45 % NaCl with KCl 20 mEq/L infusion   Intravenous Continuous Virl Cagey, MD 75 mL/hr at 04/02/2019 1247    . digoxin (LANOXIN) tablet 0.125 mg  0.125 mg Oral Daily Virl Cagey, MD   0.125 mg at 03/19/19 1000  . dronabinol (MARINOL) capsule 5 mg  5 mg Oral BID AC Virl Cagey, MD   5 mg at 03/19/19 1753  . feeding supplement (ENSURE ENLIVE) (ENSURE ENLIVE) liquid 237 mL  237 mL Oral BID BM Virl Cagey, MD   237 mL at 03/19/19 1335  . guaiFENesin (ROBITUSSIN) 100 MG/5ML solution 300 mg  15 mL Oral QID Virl Cagey, MD   300 mg at 03/19/19 2148  . guaiFENesin-dextromethorphan (ROBITUSSIN DM) 100-10 MG/5ML syrup 5 mL  5 mL Oral Q4H PRN Virl Cagey, MD   5 mL at 03/18/19 0919  . [START ON 03/21/2019] heparin injection 5,000 Units  5,000 Units Subcutaneous Q8H Curlene Labrum C, MD      . HYDROmorphone (DILAUDID) injection 0.5 mg  0.5 mg Intravenous Q4H PRN Virl Cagey, MD   0.5 mg at 03/14/2019 1302  . insulin aspart (novoLOG) injection 0-5 Units  0-5 Units Subcutaneous QHS Curlene Labrum C, MD      . insulin aspart (novoLOG) injection 0-6 Units  0-6 Units Subcutaneous TID WC Virl Cagey, MD      . Derrill Memo ON 03/21/2019] insulin glargine (LANTUS) injection 12  Units  12 Units Subcutaneous Daily Virl Cagey, MD      . Derrill Memo ON 03/21/2019] lactulose (CHRONULAC) 10 GM/15ML solution 20 g  20 g Per Tube Daily Virl Cagey, MD      . meclizine (ANTIVERT) tablet 12.5 mg  12.5 mg Oral TID PRN Virl Cagey, MD      . MEDLINE mouth rinse  15 mL Mouth Rinse BID Virl Cagey, MD   15 mL at 03/12/2019 0940  . omega-3 acid ethyl esters (LOVAZA) capsule 1 g  1 g Oral QPM Virl Cagey, MD   1 g at 03/19/19 1753  . ondansetron (ZOFRAN) injection 4 mg  4 mg Intravenous Q6H PRN Virl Cagey, MD   4 mg at 03/17/19 1637  . oxyCODONE (Oxy IR/ROXICODONE) immediate release tablet 5 mg  5 mg Oral Q4H PRN Virl Cagey, MD   5 mg at 03/14/19 1322  .  pantoprazole sodium (PROTONIX) 40 mg/20 mL oral suspension 40 mg  40 mg Oral BID AC Virl Cagey, MD   40 mg at 03/19/19 1752  . piperacillin-tazobactam (ZOSYN) IVPB 3.375 g  3.375 g Intravenous Q8H Virl Cagey, MD 12.5 mL/hr at 04/03/2019 1458 3.375 g at 03/21/2019 1458  . potassium chloride 20 MEQ/15ML (10%) solution 10 mEq  10 mEq Oral BID Virl Cagey, MD   10 mEq at 03/19/19 2148     Discharge Medications: Please see discharge summary for a list of discharge medications.  Relevant Imaging Results:  Relevant Lab Results:   Additional Information SS#  [id number]  Ihor Gully, LCSW

## 2019-03-21 ENCOUNTER — Inpatient Hospital Stay (HOSPITAL_COMMUNITY): Payer: Medicare HMO

## 2019-03-21 LAB — BASIC METABOLIC PANEL
Anion gap: 10 (ref 5–15)
BUN: 12 mg/dL (ref 8–23)
CO2: 28 mmol/L (ref 22–32)
Calcium: 8.2 mg/dL — ABNORMAL LOW (ref 8.9–10.3)
Chloride: 96 mmol/L — ABNORMAL LOW (ref 98–111)
Creatinine, Ser: 0.65 mg/dL (ref 0.61–1.24)
GFR calc Af Amer: >60 mL/min (ref >60)
GFR calc non Af Amer: >60 mL/min (ref >60)
Glucose, Bld: 180 mg/dL — ABNORMAL HIGH (ref 70–99)
Potassium: 3.8 mmol/L (ref 3.5–5.1)
Sodium: 134 mmol/L — ABNORMAL LOW (ref 135–145)

## 2019-03-21 LAB — GLUCOSE, CAPILLARY
Glucose-Capillary: 180 mg/dL — ABNORMAL HIGH (ref 70–99)
Glucose-Capillary: 170 mg/dL — ABNORMAL HIGH (ref 70–99)
Glucose-Capillary: 126 mg/dL — ABNORMAL HIGH (ref 70–99)
Glucose-Capillary: 85 mg/dL (ref 70–99)

## 2019-03-21 LAB — CBC
HCT: 37.2 % — ABNORMAL LOW (ref 39.0–52.0)
Hemoglobin: 11.5 g/dL — ABNORMAL LOW (ref 13.0–17.0)
MCH: 28.7 pg (ref 26.0–34.0)
MCHC: 30.9 g/dL (ref 30.0–36.0)
MCV: 92.8 fL (ref 80.0–100.0)
Platelets: 114 10*3/uL — ABNORMAL LOW (ref 150–400)
RBC: 4.01 MIL/uL — ABNORMAL LOW (ref 4.22–5.81)
RDW: 15.8 % — ABNORMAL HIGH (ref 11.5–15.5)
WBC: 6.9 10*3/uL (ref 4.0–10.5)
nRBC: 0 % (ref 0.0–0.2)

## 2019-03-21 MED ORDER — POTASSIUM CHLORIDE 20 MEQ/15ML (10%) PO SOLN
10.0000 meq | Freq: Two times a day (BID) | ORAL | Status: DC
  Administered 2019-03-21 – 2019-03-22 (×3): 10 meq
  Filled 2019-03-21 (×3): qty 30

## 2019-03-21 MED ORDER — AMOXICILLIN-POT CLAVULANATE 200-28.5 MG/5ML PO SUSR
800.0000 mg | Freq: Two times a day (BID) | ORAL | Status: DC
  Administered 2019-03-21 – 2019-03-22 (×3): 800 mg
  Filled 2019-03-21 (×6): qty 20

## 2019-03-21 MED ORDER — LANSOPRAZOLE 3 MG/ML SUSP: 15.0000 mg | Freq: Every day | ORAL | Status: DC

## 2019-03-21 MED ORDER — OXYCODONE HCL 5 MG PO TABS: 5.0000 mg | ORAL_TABLET | ORAL | Status: DC | PRN

## 2019-03-21 MED ORDER — OMEGA-3-ACID ETHYL ESTERS 1 G PO CAPS: 1.0000 g | ORAL_CAPSULE | Freq: Every evening | ORAL | Status: DC

## 2019-03-21 MED ORDER — CHLORHEXIDINE GLUCONATE CLOTH 2 % EX PADS
6.0000 | MEDICATED_PAD | Freq: Every day | CUTANEOUS | Status: DC
  Administered 2019-03-22: 6 via TOPICAL

## 2019-03-21 MED ORDER — FREE WATER
200.0000 mL | Freq: Four times a day (QID) | Status: DC
  Administered 2019-03-21 – 2019-03-22 (×5): 200 mL

## 2019-03-21 MED ORDER — ASPIRIN 81 MG PO CHEW
81.0000 mg | CHEWABLE_TABLET | Freq: Every day | ORAL | Status: DC
  Administered 2019-03-21 – 2019-03-22 (×2): 81 mg
  Filled 2019-03-21 (×3): qty 1

## 2019-03-21 MED ORDER — PANTOPRAZOLE SODIUM 40 MG PO PACK
20.0000 mg | PACK | Freq: Every day | ORAL | Status: DC
  Administered 2019-03-22: 20 mg

## 2019-03-21 MED ORDER — FUROSEMIDE 10 MG/ML IJ SOLN
20.0000 mg | Freq: Once | INTRAMUSCULAR | Status: AC
  Administered 2019-03-21: 20 mg via INTRAVENOUS
  Filled 2019-03-21: qty 2

## 2019-03-21 MED ORDER — VITAMIN D 25 MCG (1000 UNIT) PO TABS
2000.0000 [IU] | ORAL_TABLET | Freq: Every day | ORAL | Status: DC
  Administered 2019-03-22: 2000 [IU]
  Filled 2019-03-21 (×4): qty 2

## 2019-03-21 MED ORDER — DIGOXIN 125 MCG PO TABS
0.1250 mg | ORAL_TABLET | Freq: Every day | ORAL | Status: DC
  Administered 2019-03-22: 08:00:00 0.125 mg
  Filled 2019-03-21: qty 1

## 2019-03-21 MED ORDER — GUAIFENESIN 100 MG/5ML PO SOLN
15.0000 mL | Freq: Four times a day (QID) | ORAL | Status: DC
  Administered 2019-03-21 – 2019-03-22 (×5): 300 mg
  Filled 2019-03-21 (×2): qty 15
  Filled 2019-03-21: qty 5
  Filled 2019-03-21: qty 10
  Filled 2019-03-21: qty 15
  Filled 2019-03-21: qty 5

## 2019-03-21 MED ORDER — MECLIZINE HCL 12.5 MG PO TABS: 12.5000 mg | ORAL_TABLET | Freq: Three times a day (TID) | ORAL | Status: DC | PRN

## 2019-03-21 MED ORDER — GUAIFENESIN-DM 100-10 MG/5ML PO SYRP: 5.0000 mL | ORAL_SOLUTION | ORAL | Status: DC | PRN

## 2019-03-21 NOTE — Progress Notes (Signed)
Pharmacy Antibiotic Note   Today is day #7 of Zosyn therapy for this   78 y.o. male admitted on 03/09/2019 with pneumonia.  Patient's last temp was 99.39F, but his WBC count is WNL.  Renal function remains fairly stable.   Plan: Continue Zosyn 3.375g IV q8h (4-hr inf ) --> transition to Augmentin when appropriate Monitor labs, c/s, and patient improvement.    Height: 5\' 8"  (172.7 cm) Weight: 129 lb 6.6 oz (58.7 kg) IBW/kg (Calculated) : 68.4  Temp (24hrs), Avg:98.6 F (37 C), Min:98 F (36.7 C), Max:99.6 F (37.6 C)  Recent Labs  Lab 03/15/19 0436 03/16/19 0408 03/17/19 0846 03/18/19 0418 03/19/19 0406 03/07/2019 0506 03/21/19 1039  WBC 8.1 5.9 3.3*  --  4.7  --  6.9  CREATININE 0.64 0.43*  --  0.46* 0.50* 0.53* 0.65    Estimated Creatinine Clearance: 62.2 mL/min (by C-G formula based on SCr of 0.65 mg/dL).    Allergies  Allergen Reactions  . Actos [Pioglitazone] Other (See Comments)    Cramps in legs   . Metformin And Related Diarrhea  . Morphine Hives  . Statins     REACTION: leg cramps but tolerates pravastatin    Antimicrobials this admission:  zosyn 8/8>>   vancomycin 8/8 >> 8/10  Microbiology results: 8/9 Bcx: ngtd 8/9 RespCx: gram _+ cocci Rare budding yeast- contaminant 8/4 UCx: ng 8/8 MRSA PCR: negative -  8/4 Covid 19 negative   Thank you for allowing pharmacy to be a part of this patient's care.  Despina Pole 03/21/2019 1:27 PM

## 2019-03-21 NOTE — Progress Notes (Signed)
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 increased with solid trials. Recommend D1/puree and thin liquids with a focus on increasing liquid caloric needs for now as Pt did the best with this. SLP will follow during acute stay and recommend SNF post d/c from acute. PO medications crushed in ice cream or a small amount of puree.  SLP Visit Diagnosis Dysphagia, oropharyngeal phase (R13.12) Attention and concentration deficit following -- Frontal lobe and executive function deficit following -- Impact on safety and function Mild aspiration risk;Risk for inadequate nutrition/hydration   [facility]L IP TREATMENT RECOMMENDATION 03/17/2019 Treatment Recommendations Therapy as outlined in treatment plan below   Prognosis 03/17/2019 Prognosis for Safe Diet Advancement Fair Barriers to Reach Goals Severity of deficits Barriers/Prognosis Comment -- [facility] IP DIET RECOMMENDATION 03/17/2019 SLP Diet Recommendations Dysphagia 1 (Puree) solids;Thin liquid Liquid  Administration via Cup;Straw Medication Administration Crushed with puree Compensations Slow rate;Small sips/bites;Multiple dry swallows after each bite/sip;Follow solids with liquid Postural Changes Remain semi-upright after after feeds/meals (Comment);Seated upright at 90 degrees  CH[facility] IP OTHER RECOMMENDATIONS 03/17/2019 Recommended Consults -- Oral Care Recommendations Oral care BID;Staff/trained caregiver to provide oral care Other Recommendations Clarify dietary restrictions   [facility] IP FOLLOW UP RECOMMENDATIONS 03/17/2019 Follow up Recommendations Skilled Nursing facility   [facility] IP FREQUENCY AND DURATION 03/17/2019 Speech Therapy Frequency (ACUTE ONLY) min 2x/week Treatment Duration 1 week      [facility] IP ORAL PHASE 03/17/2019 Oral Phase Impaired Oral - Pudding Teaspoon -- Oral - Pudding Cup -- Oral - Honey Teaspoon -- Oral - Honey Cup -- Oral - Nectar Teaspoon -- Oral - Nectar Cup -- Oral - Nectar Straw -- Oral - Thin Teaspoon Weak lingual manipulation;Piecemeal swallowing;Decreased bolus cohesion Oral - Thin Cup Weak lingual manipulation;Piecemeal swallowing;Decreased bolus cohesion Oral - Thin Straw Lingual/palatal residue Oral - Puree Weak lingual manipulation;Reduced posterior propulsion;Lingual/palatal residue;Piecemeal swallowing;Delayed oral transit;Decreased bolus cohesion Oral - Mech Soft Weak lingual manipulation;Reduced posterior propulsion;Lingual/palatal residue;Piecemeal swallowing;Delayed oral transit;Decreased bolus cohesion Oral - Regular -- Oral - Multi-Consistency -- Oral - Pill Reduced posterior propulsion Oral Phase - Comment --  [facility] IP PHARYNGEAL PHASE 03/17/2019 Pharyngeal Phase Impaired Pharyngeal- Pudding Teaspoon -- Pharyngeal -- Pharyngeal- Pudding Cup -- Pharyngeal -- Pharyngeal- Honey Teaspoon -- Pharyngeal -- Pharyngeal- Honey Cup -- Pharyngeal -- Pharyngeal- Nectar Teaspoon -- Pharyngeal -- Pharyngeal- Nectar Cup -- Pharyngeal -- Pharyngeal- Nectar Straw -- Pharyngeal -- Pharyngeal- Thin  Teaspoon Delayed swallow initiation-vallecula;Reduced epiglottic inversion;Pharyngeal residue - valleculae;Reduced tongue base retraction Pharyngeal -- Pharyngeal- Thin Cup Delayed swallow initiation-vallecula;Reduced epiglottic inversion;Pharyngeal residue - valleculae;Reduced tongue base retraction Pharyngeal -- Pharyngeal- Thin Straw Pharyngeal residue - valleculae;Reduced tongue base retraction;Delayed swallow initiation-vallecula Pharyngeal -- Pharyngeal- Puree Delayed swallow initiation-vallecula;Reduced pharyngeal peristalsis;Reduced epiglottic inversion;Reduced tongue base retraction;Pharyngeal residue - valleculae;Pharyngeal residue - posterior pharnyx;Pharyngeal residue - pyriform;Lateral channel residue Pharyngeal -- Pharyngeal- Mechanical Soft Delayed swallow initiation-vallecula;Reduced pharyngeal peristalsis;Reduced epiglottic inversion;Reduced tongue base retraction;Pharyngeal residue - valleculae;Pharyngeal residue - posterior pharnyx;Pharyngeal residue - pyriform;Lateral channel residue Pharyngeal -- Pharyngeal- Regular -- Pharyngeal -- Pharyngeal- Multi-consistency -- Pharyngeal -- Pharyngeal- Pill (No Data) Pharyngeal -- Pharyngeal Comment --  [facility] IP CERVICAL ESOPHAGEAL PHASE 03/17/2019 Cervical Esophageal Phase WFL Pudding Teaspoon -- Pudding Cup -- Honey Teaspoon -- Honey Cup -- Nectar Teaspoon -- Nectar Cup -- Nectar Straw -- Thin Teaspoon -- Thin Cup -- Thin Straw -- Puree -- Mechanical Soft -- Regular -- Multi-consistency -- Pill -- Cervical Esophageal Comment -- Thank you, Genene Churn, Troy Washington 03/17/2019, 1:42 PM              [place] Ekg Site Rite  Result Date: 03/15/2019 If Site Rite image not attached, placement could not be confirmed due to current cardiac rhythm.    CBC Recent Labs  Lab 03/15/19 0436 03/15/19 2046 03/16/19 0408 03/17/19 0846 03/19/19 0406 03/21/19 1039  WBC 8.1  --  5.9 3.3* 4.7 6.9  HGB 11.4* 10.0* 9.7* 8.1* 10.1* 11.5*  HCT 36.7*  31.1* 30.6* 25.7* 31.0* 37.2*  PLT 96*  --  85* 55* 70* 114*  MCV 95.1  --  93.0 92.8 89.9 92.8  MCH 29.5  --  29.5 29.2 29.3 28.7  MCHC 31.1  --  31.7 31.5 32.6 30.9  RDW 15.2  --  14.9 15.1 15.4 15.8*    Chemistries  Recent Labs  Lab 03/16/19 0408 03/18/19 0418 03/19/19 0406 04/04/2019 0506 03/21/19 1039  NA 133* 135 136 135 134*  K 3.8 2.8* 3.4* 3.9 3.8  CL 104 101 101 100 96*  CO2 25 28 29 29 28   GLUCOSE 208* 56*  54* 145* 180*  BUN 13 9 11 14 12   CREATININE 0.43* 0.46* 0.50* 0.53* 0.65  CALCIUM 7.6* 8.1* 8.2* 8.0* 8.2*  MG  --   --  1.9  --   --   AST 40  --   --   --   --   ALT 54*  --   --   --   --   ALKPHOS 283*  --   --   --   --   BILITOT 1.1  --   --   --   --    ------------------------------------------------------------------------------------------------------------------ No results for input(s): CHOL, HDL, LDLCALC, TRIG, CHOLHDL, LDLDIRECT in the last 72 hours.  Lab Results  Component Value Date   HGBA1C 7.8 (H) 03/18/2019   ------------------------------------------------------------------------------------------------------------------ No results for input(s): TSH, T4TOTAL, T3FREE, THYROIDAB in the last 72 hours.  Invalid input(s): FREET3 ------------------------------------------------------------------------------------------------------------------ No results for input(s): VITAMINB12, FOLATE, FERRITIN, TIBC, IRON, RETICCTPCT in the last 72 hours.  Coagulation profile Recent Labs  Lab 03/18/19 1618  INR 1.6*    No results for input(s): DDIMER in the last 72 hours.  Cardiac Enzymes No results for input(s): CKMB, TROPONINI, MYOGLOBIN in the last 168 hours.  Invalid input(s): CK ------------------------------------------------------------------------------------------------------------------ No results found for: BNP   Roxan Hockey M.D on 03/21/2019 at 5:29 PM  Go to [email or web address] - for contact info  [facility] - Office   [phone]

## 2019-03-21 NOTE — Progress Notes (Signed)
Called to room by RN, pt short of breath, coughing and O2 sats in the 80's,  Pt placed back on BIPAP at this time.  RT will continue to monitor.

## 2019-03-21 NOTE — Progress Notes (Signed)
[facility]  On and off Bipap today. Had some meds per tube and gagged and coughed with this but unsure why. Patient has chronic cough. Back on 6L when I saw him last.  If able to stay off can trial tube feeds. Would not do oral feeds and PEG feeds together until you know he is tolerating the PEG feeds.   He may not tolerate and could have issues with aspiration or vomiting as he has been having some of these issues.  If he needed a deeper feeding tube due to aspiration, the PEG could be changed to a GJ by IR in about 1-2 weeks. Will need to confirm with IR.   Patient can follow up as needed. PEG has to stay in at least 6-8 months. Does not need to see me unless having issues or wanting it out.   Jose Labrum, MD [facility] [address] [place], [place] [address] [phone] (office)

## 2019-03-21 NOTE — Progress Notes (Signed)
Pt taken off BIPAP and place on 2L nasal cannula.  Pt tolerating well at this time.  RT will continue to monitor.

## 2019-03-22 LAB — DIGOXIN LEVEL: Digoxin Level: 0.4 ng/mL — ABNORMAL LOW (ref 0.8–2.0)

## 2019-03-22 LAB — GLUCOSE, CAPILLARY
Glucose-Capillary: 57 mg/dL — ABNORMAL LOW (ref 70–99)
Glucose-Capillary: 70 mg/dL (ref 70–99)
Glucose-Capillary: 94 mg/dL (ref 70–99)
Glucose-Capillary: 104 mg/dL — ABNORMAL HIGH (ref 70–99)

## 2019-03-22 LAB — HEMOGLOBIN AND HEMATOCRIT, BLOOD
HCT: 32.5 % — ABNORMAL LOW (ref 39.0–52.0)
Hemoglobin: 10.2 g/dL — ABNORMAL LOW (ref 13.0–17.0)

## 2019-03-22 MED ORDER — DIGOXIN 125 MCG PO TABS: 0.5000 mg | ORAL_TABLET | Freq: Every day | ORAL | Status: DC

## 2019-03-22 MED ORDER — OSMOLITE 1.5 CAL PO LIQD
1000.0000 mL | ORAL | Status: DC
  Administered 2019-03-22: 1000 mL

## 2019-03-22 MED ORDER — DIGOXIN 125 MCG PO TABS: 0.1875 mg | ORAL_TABLET | Freq: Every day | ORAL | Status: DC

## 2019-03-22 MED ORDER — DIGOXIN 125 MCG PO TABS
0.5000 mg | ORAL_TABLET | Freq: Once | ORAL | Status: AC
  Administered 2019-03-22: 0.5 mg via ORAL
  Filled 2019-03-22: qty 4

## 2019-03-22 MED ORDER — ALUM & MAG HYDROXIDE-SIMETH 200-200-20 MG/5ML PO SUSP: 30.0000 mL | Freq: Four times a day (QID) | ORAL | Status: DC | PRN

## 2019-03-22 NOTE — Progress Notes (Signed)
Contacted mid level in regards to PT urine. Urine is bloody in appearance. Pt seems lethargic but alert and oriented. No s/s of distress. Continue to monitor and await further orders.

## 2019-03-22 NOTE — Progress Notes (Signed)
PEG clamped after free water administration for 1 hr. To be unclamped and drained via gravity per order.

## 2019-03-22 NOTE — Progress Notes (Signed)
2 Days Post-Op  Subjective: Patient to started on continuous tube feeds this morning.  He is off BiPAP.  Objective: Vital signs in last 24 hours: Temp:  [97.7 F (36.5 C)-98.6 F (37 C)] 98.3 F (36.8 C) (08/16 1132) Pulse Rate:  [96-116] 100 (08/16 1132) Resp:  [17-33] 23 (08/16 1132) BP: (98-134)/(42-76) 100/57 (08/16 1100) SpO2:  [92 %-100 %] 95 % (08/16 1132) FiO2 (%):  [80 %] 80 % (08/16 0400) Weight:  [57.3 kg] 57.3 kg (08/16 0500) Last BM Date: 03/21/19  Intake/Output from previous day: 08/15 0701 - 08/16 0700 In: 1208.6 [I.V.:409.8; NG/GT:560; IV Piggyback:88.7] Out: 2275 [Urine:2175; Drains:100] Intake/Output this shift: Total I/O In: 20 [I.V.:20] Out: -   General appearance: no distress GI: Soft, PEG tube site clean and dry and positioned appropriately.  Nursing abdominal binder.  Lab Results:  Recent Labs    03/21/19 1039  WBC 6.9  HGB 11.5*  HCT 37.2*  PLT 114*   BMET Recent Labs    03/14/2019 0506 03/21/19 1039  NA 135 134*  K 3.9 3.8  CL 100 96*  CO2 29 28  GLUCOSE 145* 180*  BUN 14 12  CREATININE 0.53* 0.65  CALCIUM 8.0* 8.2*   PT/INR No results for input(s): LABPROT, INR in the last 72 hours.  Studies/Results: Dg Chest 1 View  Result Date: 03/21/2019 CLINICAL DATA:  Dyspnea. Peg tube placement yesterday. EXAM: CHEST  1 VIEW COMPARISON:  Chest x-rays dated 03/16/2019 and 03/15/2019. FINDINGS: Heart size and mediastinal contours are stable. RIGHT-sided PICC line appears adequately positioned with tip at the level of the lower SVC. Persistent confluent opacity at the RIGHT lung base. Improved aeration at the LEFT lung base. No new lung findings. IMPRESSION: 1. Persistent confluent opacity at the RIGHT lung base, pneumonia versus asymmetric pulmonary edema. 2. Improved aeration at the LEFT lung base. Electronically Signed   By: Franki Cabot M.D.   On: 03/21/2019 11:28   Dg Abd 1 View  Result Date: 03/21/2019 CLINICAL DATA:  78 year old male  with dyspnea and history of recent PEG tube placement EXAM: ABDOMEN - 1 VIEW COMPARISON:  Prior chest x-ray 11/30/2018 FINDINGS: Extensive airspace opacities present in the right lung base. A right upper extremity PICC is present. The catheter tip overlies the cavoatrial junction. Cardiac and mediastinal contours are within normal limits. Atherosclerotic calcifications are present in the transverse aorta. Patient is status post median sternotomy with evidence of prior aortic valve replacement. The left lung is relatively clear. No acute osseous abnormality. IMPRESSION: 1. Extensive right basilar infiltrate may represent pneumonia, or aspiration. 2. Right upper extremity PICC with the catheter tip in good position at the cavoatrial junction. 3. Gastrostomy tube projects over the gastric bubble. Electronically Signed   By: Jacqulynn Cadet M.D.   On: 03/21/2019 14:15    Anti-infectives: Anti-infectives (From admission, onward)   Start     Dose/Rate Route Frequency Ordered Stop   03/21/19 2200  amoxicillin-clavulanate (AUGMENTIN) 200-28.5 MG/5ML suspension 800 mg     800 mg of amoxicillin Per Tube Every 12 hours 03/21/19 1619     03/22/2019 1015  ceFAZolin (ANCEF) IVPB 2g/100 mL premix     2 g 200 mL/hr over 30 Minutes Intravenous On call to O.R. 03/17/2019 1007 03/22/2019 1052   03/16/19 1100  vancomycin (VANCOCIN) 500 mg in sodium chloride 0.9 % 100 mL IVPB     500 mg 100 mL/hr over 60 Minutes Intravenous Every 12 hours 03/16/19 0945 03/16/19 2305   03/15/19 2300  vancomycin (VANCOCIN) 500 mg in sodium chloride 0.9 % 100 mL IVPB  Status:  Discontinued     500 mg 100 mL/hr over 60 Minutes Intravenous Every 12 hours 03/15/19 1033 03/16/19 0945   03/15/19 1400  piperacillin-tazobactam (ZOSYN) IVPB 3.375 g     3.375 g 12.5 mL/hr over 240 Minutes Intravenous Every 8 hours 03/15/19 1011 03/21/19 2014   03/15/19 1015  vancomycin (VANCOCIN) IVPB 1000 mg/200 mL premix     1,000 mg 200 mL/hr over 60 Minutes  Intravenous  Once 03/15/19 1011 03/15/19 2000   03/07/2019 2200  valACYclovir (VALTREX) tablet 1,000 mg  Status:  Discontinued     1,000 mg Oral 2 times daily 03/12/2019 1921 03/17/19 1639      Assessment/Plan: s/p Procedure(s): PERCUTANEOUS ENDOSCOPIC GASTROSTOMY (PEG) PLACEMENT ESOPHAGOGASTRODUODENOSCOPY (EGD) Impression: Tube feeds have been just recently restarted.  Will watch residuals.  LOS: 11 days    Aviva Signs 03/22/2019

## 2019-03-22 NOTE — Progress Notes (Signed)
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 of thin or puree and had to expectorate the pill. Pt with significant gagging when attempting to swallow purees and solids. Alternating solids and liquids was partially effective in reducing pharyngeal residue of solids, however some remained. Pt was noted to cough frequently, however no penetration or aspiration observed. Esophageal sweep revealed air filled esophagus. Pt with gagging and belching throughout the study, which increased with solid trials. Recommend D1/puree and thin liquids with a focus on increasing liquid caloric needs for now as Pt did the best with this. SLP will follow during acute stay and recommend SNF post d/c from acute. PO medications crushed in ice cream or a small amount of puree.  SLP Visit Diagnosis Dysphagia, oropharyngeal phase (R13.12) Attention and concentration deficit following -- Frontal lobe and executive function deficit following -- Impact on  safety and function  Mild aspiration risk;Risk for inadequate nutrition/hydration   [facility] IP TREATMENT RECOMMENDATION 03/17/2019 Treatment Recommendations Therapy as outlined in treatment plan below   Prognosis 03/17/2019 Prognosis for Safe Diet Advancement Fair Barriers to Reach Goals Severity of deficits Barriers/Prognosis Comment -- [facility] IP DIET RECOMMENDATION 03/17/2019 SLP Diet Recommendations Dysphagia 1 (Puree) solids;Thin liquid Liquid Administration via Cup;Straw Medication Administration Crushed with puree Compensations Slow rate;Small sips/bites;Multiple dry swallows after each bite/sip;Follow solids with liquid Postural Changes Remain semi-upright after after feeds/meals (Comment);Seated upright at 90 degrees   [facility] IP OTHER RECOMMENDATIONS 03/17/2019 Recommended Consults -- Oral Care Recommendations Oral care BID;Staff/trained caregiver to provide oral care Other Recommendations Clarify dietary restrictions   [facility] IP FOLLOW UP RECOMMENDATIONS 03/17/2019 Follow up Recommendations Skilled Nursing facility   [facility] IP FREQUENCY AND DURATION 03/17/2019 Speech Therapy Frequency (ACUTE ONLY) min 2x/week Treatment Duration 1 week      [facility] IP ORAL PHASE 03/17/2019 Oral Phase Impaired Oral - Pudding Teaspoon -- Oral - Pudding Cup -- Oral - Honey Teaspoon -- Oral - Honey Cup -- Oral - Nectar Teaspoon -- Oral - Nectar Cup -- Oral - Nectar Straw -- Oral - Thin Teaspoon Weak lingual manipulation;Piecemeal swallowing;Decreased bolus cohesion Oral - Thin Cup Weak lingual manipulation;Piecemeal swallowing;Decreased bolus cohesion Oral - Thin Straw Lingual/palatal residue Oral - Puree Weak lingual manipulation;Reduced posterior propulsion;Lingual/palatal residue;Piecemeal swallowing;Delayed oral transit;Decreased bolus cohesion Oral - Mech Soft Weak lingual manipulation;Reduced posterior propulsion;Lingual/palatal residue;Piecemeal swallowing;Delayed oral transit;Decreased bolus cohesion Oral - Regular -- Oral - Multi-Consistency --  Oral - Pill Reduced posterior propulsion Oral Phase - Comment --  [facility] IP PHARYNGEAL PHASE 03/17/2019 Pharyngeal Phase Impaired Pharyngeal- Pudding Teaspoon -- Pharyngeal -- Pharyngeal- Pudding Cup -- Pharyngeal -- Pharyngeal- Honey Teaspoon -- Pharyngeal -- Pharyngeal- Honey Cup -- Pharyngeal -- Pharyngeal- Nectar Teaspoon -- Pharyngeal -- Pharyngeal- Nectar Cup -- Pharyngeal -- Pharyngeal- Nectar Straw -- Pharyngeal -- Pharyngeal- Thin Teaspoon Delayed swallow initiation-vallecula;Reduced epiglottic inversion;Pharyngeal residue - valleculae;Reduced tongue base retraction Pharyngeal -- Pharyngeal- Thin Cup Delayed swallow initiation-vallecula;Reduced epiglottic inversion;Pharyngeal residue - valleculae;Reduced tongue base retraction Pharyngeal -- Pharyngeal- Thin Straw Pharyngeal residue - valleculae;Reduced tongue base retraction;Delayed swallow initiation-vallecula Pharyngeal -- Pharyngeal- Puree Delayed swallow initiation-vallecula;Reduced pharyngeal peristalsis;Reduced epiglottic inversion;Reduced tongue base retraction;Pharyngeal residue - valleculae;Pharyngeal residue - posterior pharnyx;Pharyngeal residue - pyriform;Lateral channel residue Pharyngeal -- Pharyngeal- Mechanical Soft Delayed swallow initiation-vallecula;Reduced pharyngeal peristalsis;Reduced epiglottic inversion;Reduced tongue base retraction;Pharyngeal residue - valleculae;Pharyngeal residue - posterior pharnyx;Pharyngeal residue - pyriform;Lateral channel residue Pharyngeal -- Pharyngeal- Regular -- Pharyngeal -- Pharyngeal- Multi-consistency -- Pharyngeal -- Pharyngeal- Pill (No Data) Pharyngeal -- Pharyngeal Comment --  CH[facility] IP CERVICAL ESOPHAGEAL PHASE 03/17/2019 Cervical Esophageal Phase WFL Pudding Teaspoon -- Pudding Cup -- Honey Teaspoon -- Honey Cup -- Nectar Teaspoon -- Nectar Cup -- Nectar Straw -- Thin Teaspoon -- Thin Cup -- Thin Straw -- Puree -- Mechanical Soft -- Regular -- Multi-consistency -- Pill -- Cervical Esophageal Comment  -- Thank you, Genene Churn, [place] 03/17/2019, 1:42 PM              [place] Ekg Site Rite  Result Date: 03/15/2019 If Site Rite image not attached, placement could not be confirmed due to current cardiac rhythm.    CBC Recent Labs  Lab 03/15/19 2046 03/16/19 0408 03/17/19 0846 03/19/19 0406 03/21/19 1039  WBC  --  5.9 3.3* 4.7 6.9  HGB 10.0* 9.7* 8.1* 10.1* 11.5*  HCT 31.1* 30.6* 25.7* 31.0* 37.2*  PLT  --  85* 55* 70* 114*  MCV  --  93.0 92.8  89.9 92.8  MCH  --  29.5 29.2 29.3 28.7  MCHC  --  31.7 31.5 32.6 30.9  RDW  --  14.9 15.1 15.4 15.8*    Chemistries  Recent Labs  Lab 03/16/19 0408 03/18/19 0418 03/19/19 0406 03/29/2019 0506 03/21/19 1039  NA 133* 135 136 135 134*  K 3.8 2.8* 3.4* 3.9 3.8  CL 104 101 101 100 96*  CO2 25 28 29 29 28   GLUCOSE 208* 56* 54* 145* 180*  BUN 13 9 11 14 12   CREATININE 0.43* 0.46* 0.50* 0.53* 0.65  CALCIUM 7.6* 8.1* 8.2* 8.0* 8.2*  MG  --   --  1.9  --   --   AST 40  --   --   --   --   ALT 54*  --   --   --   --   ALKPHOS 283*  --   --   --   --   BILITOT 1.1  --   --   --   --    ------------------------------------------------------------------------------------------------------------------ No results for input(s): CHOL, HDL, LDLCALC, TRIG, CHOLHDL, LDLDIRECT in the last 72 hours.  Lab Results  Component Value Date   HGBA1C 7.8 (H) 03/18/2019   ------------------------------------------------------------------------------------------------------------------ No results for input(s): TSH, T4TOTAL, T3FREE, THYROIDAB in the last 72 hours.  Invalid input(s): FREET3 ------------------------------------------------------------------------------------------------------------------ No results for input(s): VITAMINB12, FOLATE, FERRITIN, TIBC, IRON, RETICCTPCT in the last 72 hours.  Coagulation profile Recent Labs  Lab 03/18/19 1618  INR 1.6*    No results for input(s): DDIMER in the last 72  hours.  Cardiac Enzymes No results for input(s): CKMB, TROPONINI, MYOGLOBIN in the last 168 hours.  Invalid input(s): CK ------------------------------------------------------------------------------------------------------------------ No results found for: BNP   Roxan Hockey M.D on 03/22/2019 at 5:05 PM  Go to [email or web address] - for contact info  [facility] - Office  [phone]

## 2019-03-22 NOTE — Progress Notes (Signed)
PEG clamped after medications and free water administration for 1 to 1.5 hours to ensure absorption of medications. Unclamped and drained via gravity per order.

## 2019-03-22 NOTE — Progress Notes (Signed)
Reported to [employer], PT to 330. PT stable, no s/s of distress. Transported via bed. PT had BM during transfer, cleaned and full bed change. PT became nauseous and vomited when transferred from bed to bed. PT coughing and spitting to expel mucous and secretions. Oral suctioning provided. PT HR slightly elevated due to vomiting. HOB at 50 degrees, to be no lower than 40 and reported to nurse. Residual assessed and less than 48mL from peg. PT denies pain at this time. Zofran given for N/V. Continue to monitor and reported to have PT remain in High fowlers to assist with secretion expulsion and coughing.

## 2019-03-22 NOTE — Progress Notes (Signed)
Tube feed infusing. PT shows no s/s of distress or complication with feed. PT reports no SOB,  Difficulty breathing, or pain. Bowel sound are active and PT reports passing flatus. Abdomen slightly distended but soft. Residual less than 5 mL at this time. Continue to monitor.

## 2019-03-23 ENCOUNTER — Encounter (HOSPITAL_COMMUNITY): Payer: Self-pay | Admitting: General Surgery

## 2019-03-23 ENCOUNTER — Inpatient Hospital Stay (HOSPITAL_COMMUNITY): Payer: Medicare HMO

## 2019-03-23 DIAGNOSIS — J9601 Acute respiratory failure with hypoxia: Secondary | ICD-10-CM

## 2019-03-23 DIAGNOSIS — J189 Pneumonia, unspecified organism: Secondary | ICD-10-CM

## 2019-03-23 DIAGNOSIS — I471 Supraventricular tachycardia: Secondary | ICD-10-CM

## 2019-03-23 LAB — BASIC METABOLIC PANEL
Anion gap: 5 (ref 5–15)
BUN: 21 mg/dL (ref 8–23)
CO2: 30 mmol/L (ref 22–32)
Calcium: 8.4 mg/dL — ABNORMAL LOW (ref 8.9–10.3)
Chloride: 100 mmol/L (ref 98–111)
Creatinine, Ser: 0.52 mg/dL — ABNORMAL LOW (ref 0.61–1.24)
GFR calc Af Amer: >60 mL/min (ref >60)
GFR calc non Af Amer: >60 mL/min (ref >60)
Glucose, Bld: 68 mg/dL — ABNORMAL LOW (ref 70–99)
Potassium: 4.1 mmol/L (ref 3.5–5.1)
Sodium: 135 mmol/L (ref 135–145)

## 2019-03-23 LAB — CBC
HCT: 34.8 % — ABNORMAL LOW (ref 39.0–52.0)
Hemoglobin: 11.1 g/dL — ABNORMAL LOW (ref 13.0–17.0)
MCH: 29.3 pg (ref 26.0–34.0)
MCHC: 31.9 g/dL (ref 30.0–36.0)
MCV: 91.8 fL (ref 80.0–100.0)
Platelets: 177 10*3/uL (ref 150–400)
RBC: 3.79 MIL/uL — ABNORMAL LOW (ref 4.22–5.81)
RDW: 15.8 % — ABNORMAL HIGH (ref 11.5–15.5)
WBC: 16.9 10*3/uL — ABNORMAL HIGH (ref 4.0–10.5)
nRBC: 0 % (ref 0.0–0.2)

## 2019-03-23 LAB — BLOOD GAS, ARTERIAL
Acid-Base Excess: 7.9 mmol/L — ABNORMAL HIGH (ref 0.0–2.0)
Bicarbonate: 31.2 mmol/L — ABNORMAL HIGH (ref 20.0–28.0)
FIO2: 35
O2 Saturation: 81.8 %
Patient temperature: 37
pCO2 arterial: 41.9 mmHg (ref 32.0–48.0)
pH, Arterial: 7.49 — ABNORMAL HIGH (ref 7.350–7.450)
pO2, Arterial: 46.4 mmHg — ABNORMAL LOW (ref 83.0–108.0)

## 2019-03-23 LAB — TROPONIN I (HIGH SENSITIVITY)
Troponin I (High Sensitivity): 28 ng/L — ABNORMAL HIGH (ref <18)
Troponin I (High Sensitivity): 31 ng/L — ABNORMAL HIGH (ref <18)

## 2019-03-23 LAB — GLUCOSE, CAPILLARY: Glucose-Capillary: 72 mg/dL (ref 70–99)

## 2019-03-23 MED ORDER — VANCOMYCIN HCL 10 G IV SOLR
1250.0000 mg | Freq: Once | INTRAVENOUS | Status: DC
  Filled 2019-03-23: qty 1250

## 2019-03-23 MED ORDER — ZOLPIDEM TARTRATE 5 MG PO TABS
5.0000 mg | ORAL_TABLET | Freq: Once | ORAL | Status: AC
  Administered 2019-03-23: 5 mg
  Filled 2019-03-23: qty 1

## 2019-03-23 MED ORDER — ALBUMIN HUMAN 25 % IV SOLN
25.0000 g | Freq: Four times a day (QID) | INTRAVENOUS | Status: DC
  Administered 2019-03-23: 09:00:00 25 g via INTRAVENOUS
  Filled 2019-03-23: qty 100

## 2019-03-23 MED ORDER — HYDROMORPHONE HCL 1 MG/ML IJ SOLN
1.0000 mg | INTRAMUSCULAR | Status: DC | PRN
  Administered 2019-03-23: 1 mg via INTRAVENOUS
  Filled 2019-03-23: qty 1

## 2019-03-23 MED ORDER — SODIUM CHLORIDE 0.9 % IV BOLUS
500.0000 mL | Freq: Once | INTRAVENOUS | Status: AC
  Administered 2019-03-23: 500 mL via INTRAVENOUS

## 2019-03-23 MED ORDER — PIPERACILLIN-TAZOBACTAM 3.375 G IVPB
3.3750 g | Freq: Three times a day (TID) | INTRAVENOUS | Status: DC
  Administered 2019-03-23: 3.375 g via INTRAVENOUS
  Filled 2019-03-23: qty 50

## 2019-03-23 MED ORDER — LORAZEPAM 2 MG/ML IJ SOLN: 0.5000 mg | INTRAMUSCULAR | Status: DC | PRN

## 2019-03-23 MED ORDER — ACETAMINOPHEN 650 MG RE SUPP: 650.0000 mg | RECTAL | Status: DC | PRN

## 2019-03-23 MED ORDER — ACETAMINOPHEN 325 MG RE SUPP
325.0000 mg | Freq: Once | RECTAL | Status: AC
  Administered 2019-03-23: 325 mg via RECTAL
  Filled 2019-03-23: qty 1

## 2019-03-23 MED ORDER — PROMETHAZINE HCL 25 MG/ML IJ SOLN
12.5000 mg | Freq: Four times a day (QID) | INTRAMUSCULAR | Status: DC | PRN
  Administered 2019-03-23: 12.5 mg via INTRAVENOUS
  Filled 2019-03-23: qty 1

## 2019-03-23 MED ORDER — FUROSEMIDE 10 MG/ML IJ SOLN
20.0000 mg | Freq: Once | INTRAMUSCULAR | Status: AC
  Administered 2019-03-23: 20 mg via INTRAVENOUS
  Filled 2019-03-23: qty 2

## 2019-03-23 MED ORDER — VANCOMYCIN HCL IN DEXTROSE 1-5 GM/200ML-% IV SOLN
1000.0000 mg | Freq: Once | INTRAVENOUS | Status: AC
  Administered 2019-03-23: 1000 mg via INTRAVENOUS
  Filled 2019-03-23: qty 200

## 2019-03-23 MED ORDER — SODIUM CHLORIDE 0.9 % IV SOLN
2.0000 g | Freq: Once | INTRAVENOUS | Status: AC
  Administered 2019-03-23: 2 g via INTRAVENOUS
  Filled 2019-03-23: qty 2

## 2019-03-23 MED ORDER — OXYCODONE HCL 5 MG PO TABS: 5.0000 mg | ORAL_TABLET | ORAL | Status: DC | PRN

## 2019-03-23 MED ORDER — GLYCOPYRROLATE 0.2 MG/ML IJ SOLN: 0.2000 mg | INTRAMUSCULAR | Status: DC | PRN

## 2019-03-25 LAB — CULTURE, BLOOD (ROUTINE X 2)
Culture: NO GROWTH
Special Requests: ADEQUATE

## 2019-03-26 NOTE — Progress Notes (Signed)
  Radiation Oncology         [phone] ________________________________  Name: [name] MRN: [id number]  Date: 01/21/2019  DOB: 03-24-1941  RESPIRATORY MOTION MANAGEMENT SIMULATION  NARRATIVE:  In order to account for effect of respiratory motion on target structures and other organs in the planning and delivery of radiotherapy, this patient underwent respiratory motion management simulation.  To accomplish this, when the patient was brought to the CT simulation planning suite, 4D respiratoy motion management CT images were obtained.  The CT images were loaded into the planning software.  Then, using a variety of tools including Cine, MIP, and standard views, the target volume and planning target volumes (PTV) were delineated.  Avoidance structures were contoured.  Treatment planning then occurred.  Dose volume histograms were generated and reviewed for each of the requested structure.  The resulting plan was carefully reviewed and approved today.   ------------------------------------------------  Jodelle Gross, MD, PhD

## 2019-03-26 NOTE — Progress Notes (Signed)
[place] Radiation Oncology Simulation and Treatment Planning Note   Name:  [name] MRN: [id number]   Date: 01/21/2019  DOB: September 27, 1940  Status:outpatient    DIAGNOSIS:    ICD-10-CM   1. 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  Identity was confirmed.  All relevant records and images related to the planned course of therapy were reviewed.  Then, the patient was positioned in a stable reproducible clinical set-up for radiation therapy. Abdominal compression was applied by me.  4D CT images were obtained and reproducible breathing pattern was confirmed. Free breathing CT images were obtained.  Skin markings were placed.  The CT images were loaded into the planning software where the target and avoidance structures were contoured.  The radiation prescription was entered and confirmed.    TREATMENT PLANNING NOTE:  Treatment planning then occurred. I have requested : IMRT planning.This treatment technique is medically necessary due to the high-dose of radiation delivered to the target region which is in close proximity to adjacent critical normal structures.  3 dimensional simulation is performed and dose volume histogram of the gross tumor volume, planning tumor volume and criticial normal structures including the spinal cord and lungs were analyzed and requested.  Special treatment procedure was performed due to high dose per fraction.  The patient will be monitored for increased risk of toxicity.  Daily imaging using cone beam CT will be used for target localization.  I anticipate that the patient will receive 50 Gy in 10 fractions to target volume. Further adjustments will be made based  on the planning process is necessary.  ------------------------------------------------  Jodelle Gross, MD, PhD

## 2019-03-27 ENCOUNTER — Encounter (HOSPITAL_COMMUNITY): Payer: Medicare HMO

## 2019-04-07 NOTE — Progress Notes (Signed)
Dr. Maudie Mercury paged to make aware of pts low grade fever- pt has no order for Tylenol. Dr. Maudie Mercury called back and stated he would put Tylenol suppository in for patient. Waiting for orders and will give. Will continue to monitor

## 2019-04-07 NOTE — Progress Notes (Signed)
Patient was on BiPAP , he most likely aspirated some of his tube feeding. NT suctioned a large amount of secretions. Not all of it was tube feeding. Have placed on NRB mask plus 10 liter high flow. Saturation still is on 91 at this time. He has been transferred back to Unit. Tube feeding has been stopped because of his nausea and vomiting.

## 2019-04-07 NOTE — Discharge Summary (Signed)
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 04/01/2019 0506 03/21/19 1039 04/16/2019 0201  NA 135 136 135 134* 135  K 2.8* 3.4* 3.9 3.8 4.1  CL 101 101 100 96* 100  CO2 28 29 29 28 30   GLUCOSE 56* 54* 145* 180* 68*  BUN 9 11 14 12 21   CREATININE 0.46* 0.50* 0.53* 0.65 0.52*  CALCIUM 8.1* 8.2* 8.0* 8.2* 8.4*  MG  --  1.9  --   --   --    Liver Function Tests: No results for input(s): AST, ALT, ALKPHOS, BILITOT, PROT, ALBUMIN in the last 168 hours. No results for input(s): LIPASE, AMYLASE in the last 168 hours. No results for input(s): AMMONIA in the last 168 hours. CBC: Recent Labs  Lab 03/17/19 0846 03/19/19 0406 03/21/19 1039 03/22/19 2136 2019/04/16 0201  WBC 3.3* 4.7 6.9  --  16.9*  HGB 8.1* 10.1* 11.5* 10.2* 11.1*  HCT 25.7* 31.0* 37.2* 32.5* 34.8*  MCV 92.8 89.9 92.8  --  91.8  PLT 55* 70* 114*  --  177   Cardiac Enzymes: No results for input(s): CKTOTAL, CKMB, CKMBINDEX, TROPONINI in the last 168 hours. D-Dimer No results for input(s): DDIMER in the last 72 hours. BNP: Invalid input(s): POCBNP CBG: Recent Labs  Lab 03/22/19 0715 03/22/19 1132 03/22/19 1600 03/22/19 2208  16-Apr-2019 0736  GLUCAP 57* 70 94 104* 72   Anemia work up No results for input(s): VITAMINB12, FOLATE, FERRITIN, TIBC, IRON, RETICCTPCT in the last 72 hours. Urinalysis    Component Value Date/Time   COLORURINE YELLOW 03/16/2019 0957   APPEARANCEUR HAZY (A) 03/15/2019 0957   LABSPEC >1.030 (H) 03/16/2019 0957   PHURINE 5.0 03/25/2019 0957   GLUCOSEU 250 (A) 03/13/2019 0957   HGBUR TRACE (A) 03/19/2019 0957   BILIRUBINUR NEGATIVE 03/21/2019 0957   KETONESUR TRACE (A) 03/21/2019 0957   PROTEINUR TRACE (A) 04/05/2019 0957   UROBILINOGEN 0.2 04/10/2014 1256   NITRITE NEGATIVE 03/29/2019 0957   LEUKOCYTESUR MODERATE (A) 03/20/2019 0957   Sepsis Labs Invalid input(s): PROCALCITONIN,  WBC,  LACTICIDVEN     SIGNED:  Kathie Dike, MD  [facility] 2019/04/16, 7:03 PM Pager   If 7PM-7AM, please contact night-coverage [email or web address] Password [email or web address]

## 2019-04-07 NOTE — Progress Notes (Signed)
ANTIBIOTIC CONSULT NOTE-Preliminary  Pharmacy Consult for cefepime, vancomycin Indication: pneumonia  Allergies  Allergen Reactions  . Actos [Pioglitazone] Other (See Comments)    Cramps in legs   . Metformin And Related Diarrhea  . Morphine Hives  . Statins     REACTION: leg cramps but tolerates pravastatin    Patient Measurements: Height: 5\' 8"  (172.7 cm) Weight: 126 lb 5.2 oz (57.3 kg) IBW/kg (Calculated) : 68.4 Adjusted Body Weight:   Vital Signs: Temp: 99.9 F (37.7 C) (08/17 0104) Temp Source: Axillary (08/17 0104) BP: 136/75 (08/17 0104) Pulse Rate: 118 (08/17 0145)  Labs: Recent Labs    03/21/2019 0506 03/21/19 1039 03/22/19 2136 April 11, 2019 0201  WBC  --  6.9  --  16.9*  HGB  --  11.5* 10.2* 11.1*  PLT  --  114*  --  177  CREATININE 0.53* 0.65  --  0.52*    Estimated Creatinine Clearance: 60.7 mL/min (A) (by C-G formula based on SCr of 0.52 mg/dL (L)).  No results for input(s): VANCOTROUGH, VANCOPEAK, VANCORANDOM, GENTTROUGH, GENTPEAK, GENTRANDOM, TOBRATROUGH, TOBRAPEAK, TOBRARND, AMIKACINPEAK, AMIKACINTROU, AMIKACIN in the last 72 hours.   Microbiology: Recent Results (from the past 720 hour(s))  SARS Coronavirus 2 (Performed in [facility] hospital lab)     Status: None   Collection Time: 03/02/19  9:56 AM   Specimen: Nasal Swab  Result Value Ref Range Status   SARS Coronavirus 2 NEGATIVE NEGATIVE Final    Comment: (NOTE) SARS-CoV-2 target nucleic acids are NOT DETECTED. The SARS-CoV-2 RNA is generally detectable in upper and lower respiratory specimens during the acute phase of infection. Negative results do not preclude SARS-CoV-2 infection, do not rule out co-infections with other pathogens, and should not be used as the sole basis for treatment or other patient management decisions. Negative results must be combined with clinical observations, patient history, and epidemiological information. The expected result is Negative. Fact Sheet for  Patients: SugarRoll.be Fact Sheet for Healthcare Providers: https://www.woods-mathews.com/ This test is not yet approved or cleared by the [place] FDA and  has been authorized for detection and/or diagnosis of SARS-CoV-2 by FDA under an Emergency Use Authorization (EUA). This EUA will remain  in effect (meaning this test can be used) for the duration of the COVID-19 declaration under Section 56 4(b)(1) of the Act, 21 U.S.C. section 360bbb-3(b)(1), unless the authorization is terminated or revoked sooner. Performed at [facility], [place] [address] [place], [place] [address]   Urine culture     Status: None   Collection Time: 03/22/2019  9:57 AM   Specimen: Urine, Clean Catch  Result Value Ref Range Status   Specimen Description   Final    URINE, CLEAN CATCH Performed at [facility], [address] [place], [place] [address]    Special Requests   Final    NONE Performed at [facility], [address] [place], [place] [address]    Culture   Final    Multiple bacterial morphotypes present, none predominant. Suggest appropriate recollection if clinically indicated.   Report Status 03/11/2019 FINAL  Final  SARS CORONAVIRUS 2 Nasal Swab Aptima Multi Swab     Status: None   Collection Time: 03/09/2019 11:12 AM   Specimen: Aptima Multi Swab; Nasal Swab  Result Value Ref Range Status   SARS Coronavirus 2 NEGATIVE NEGATIVE Final    Comment: (NOTE) SARS-CoV-2 target nucleic acids are NOT DETECTED. The SARS-CoV-2 RNA is generally detectable in upper and lower respiratory specimens during the acute  Gram Stain   Final    FEW WBC PRESENT,BOTH PMN AND MONONUCLEAR GRAM POSITIVE COCCI IN PAIRS RARE BUDDING YEAST SEEN    Culture   Final    FEW Consistent with normal respiratory flora. Performed at [facility], [place] [address] [place], [place] [address]    Report Status 03/17/2019 FINAL  Final  Surgical pcr screen     Status: None   Collection Time: 04/03/2019  3:20 AM   Specimen: Nasal Mucosa; Nasal Swab  Result Value Ref Range Status   MRSA, PCR NEGATIVE NEGATIVE Final   Staphylococcus aureus NEGATIVE NEGATIVE Final    Comment: (NOTE) The Xpert SA Assay (FDA approved for NASAL specimens in patients 41 years of age and older), is one component of a comprehensive surveillance program. It is not intended to diagnose infection nor to guide or monitor treatment. Performed at [facility], [address] [place], [place] [address]     Medical History: Past Medical  History:  Diagnosis Date  . Aortic stenosis, mild   . Arthritis   . BPH (benign prostatic hypertrophy)   . CAD (coronary artery disease)   . Cancer (HCC)    spindle cell cancer of left ear, hepatocellular  . Constipation   . Diabetes mellitus    Type II  . Fracture, shoulder    left  . GERD (gastroesophageal reflux disease)   . History of cataract   . History of kidney stones    passed  . Hyperlipidemia   . Hypertension   . Nephrolithiasis   . Pancreatitis    acute  . Vitamin D deficiency     Medications:  Anti-infectives (From admission, onward)   Start     Dose/Rate Route Frequency Ordered Stop   04-21-19 0345  vancomycin (VANCOCIN) 1,250 mg in sodium chloride 0.9 % 250 mL IVPB     1,250 mg 166.7 mL/hr over 90 Minutes Intravenous  Once 04-21-2019 0344     04-21-19 0345  ceFEPIme (MAXIPIME) 2 g in sodium chloride 0.9 % 100 mL IVPB     2 g 200 mL/hr over 30 Minutes Intravenous  Once Apr 21, 2019 0344     03/21/19 2200  amoxicillin-clavulanate (AUGMENTIN) 200-28.5 MG/5ML suspension 800 mg     800 mg of amoxicillin Per Tube Every 12 hours 03/21/19 1619     03/26/2019 1015  ceFAZolin (ANCEF) IVPB 2g/100 mL premix     2 g 200 mL/hr over 30 Minutes Intravenous On call to O.R. 03/17/2019 1007 03/19/2019 1052   03/16/19 1100  vancomycin (VANCOCIN) 500 mg in sodium chloride 0.9 % 100 mL IVPB     500 mg 100 mL/hr over 60 Minutes Intravenous Every 12 hours 03/16/19 0945 03/16/19 2305   03/15/19 2300  vancomycin (VANCOCIN) 500 mg in sodium chloride 0.9 % 100 mL IVPB  Status:  Discontinued     500 mg 100 mL/hr over 60 Minutes Intravenous Every 12 hours 03/15/19 1033 03/16/19 0945   03/15/19 1400  piperacillin-tazobactam (ZOSYN) IVPB 3.375 g     3.375 g 12.5 mL/hr over 240 Minutes Intravenous Every 8 hours 03/15/19 1011 03/21/19 2014   03/15/19 1015  vancomycin (VANCOCIN) IVPB 1000 mg/200 mL premix     1,000 mg 200 mL/hr over 60 Minutes Intravenous  Once 03/15/19 1011 03/15/19 2000    03/09/2019 2200  valACYclovir (VALTREX) tablet 1,000 mg  Status:  Discontinued     1,000 mg Oral 2 times daily 03/31/2019 1921 03/17/19 1639  Gram Stain   Final    FEW WBC PRESENT,BOTH PMN AND MONONUCLEAR GRAM POSITIVE COCCI IN PAIRS RARE BUDDING YEAST SEEN    Culture   Final    FEW Consistent with normal respiratory flora. Performed at [facility], [place] [address] [place], [place] [address]    Report Status 03/17/2019 FINAL  Final  Surgical pcr screen     Status: None   Collection Time: 04/03/2019  3:20 AM   Specimen: Nasal Mucosa; Nasal Swab  Result Value Ref Range Status   MRSA, PCR NEGATIVE NEGATIVE Final   Staphylococcus aureus NEGATIVE NEGATIVE Final    Comment: (NOTE) The Xpert SA Assay (FDA approved for NASAL specimens in patients 41 years of age and older), is one component of a comprehensive surveillance program. It is not intended to diagnose infection nor to guide or monitor treatment. Performed at [facility], [address] [place], [place] [address]     Medical History: Past Medical  History:  Diagnosis Date  . Aortic stenosis, mild   . Arthritis   . BPH (benign prostatic hypertrophy)   . CAD (coronary artery disease)   . Cancer (HCC)    spindle cell cancer of left ear, hepatocellular  . Constipation   . Diabetes mellitus    Type II  . Fracture, shoulder    left  . GERD (gastroesophageal reflux disease)   . History of cataract   . History of kidney stones    passed  . Hyperlipidemia   . Hypertension   . Nephrolithiasis   . Pancreatitis    acute  . Vitamin D deficiency     Medications:  Anti-infectives (From admission, onward)   Start     Dose/Rate Route Frequency Ordered Stop   04-21-19 0345  vancomycin (VANCOCIN) 1,250 mg in sodium chloride 0.9 % 250 mL IVPB     1,250 mg 166.7 mL/hr over 90 Minutes Intravenous  Once 04-21-2019 0344     04-21-19 0345  ceFEPIme (MAXIPIME) 2 g in sodium chloride 0.9 % 100 mL IVPB     2 g 200 mL/hr over 30 Minutes Intravenous  Once Apr 21, 2019 0344     03/21/19 2200  amoxicillin-clavulanate (AUGMENTIN) 200-28.5 MG/5ML suspension 800 mg     800 mg of amoxicillin Per Tube Every 12 hours 03/21/19 1619     03/26/2019 1015  ceFAZolin (ANCEF) IVPB 2g/100 mL premix     2 g 200 mL/hr over 30 Minutes Intravenous On call to O.R. 03/17/2019 1007 03/19/2019 1052   03/16/19 1100  vancomycin (VANCOCIN) 500 mg in sodium chloride 0.9 % 100 mL IVPB     500 mg 100 mL/hr over 60 Minutes Intravenous Every 12 hours 03/16/19 0945 03/16/19 2305   03/15/19 2300  vancomycin (VANCOCIN) 500 mg in sodium chloride 0.9 % 100 mL IVPB  Status:  Discontinued     500 mg 100 mL/hr over 60 Minutes Intravenous Every 12 hours 03/15/19 1033 03/16/19 0945   03/15/19 1400  piperacillin-tazobactam (ZOSYN) IVPB 3.375 g     3.375 g 12.5 mL/hr over 240 Minutes Intravenous Every 8 hours 03/15/19 1011 03/21/19 2014   03/15/19 1015  vancomycin (VANCOCIN) IVPB 1000 mg/200 mL premix     1,000 mg 200 mL/hr over 60 Minutes Intravenous  Once 03/15/19 1011 03/15/19 2000    03/09/2019 2200  valACYclovir (VALTREX) tablet 1,000 mg  Status:  Discontinued     1,000 mg Oral 2 times daily 03/31/2019 1921 03/17/19 1639  ANTIBIOTIC CONSULT NOTE-Preliminary  Pharmacy Consult for cefepime, vancomycin Indication: pneumonia  Allergies  Allergen Reactions  . Actos [Pioglitazone] Other (See Comments)    Cramps in legs   . Metformin And Related Diarrhea  . Morphine Hives  . Statins     REACTION: leg cramps but tolerates pravastatin    Patient Measurements: Height: 5\' 8"  (172.7 cm) Weight: 126 lb 5.2 oz (57.3 kg) IBW/kg (Calculated) : 68.4 Adjusted Body Weight:   Vital Signs: Temp: 99.9 F (37.7 C) (08/17 0104) Temp Source: Axillary (08/17 0104) BP: 136/75 (08/17 0104) Pulse Rate: 118 (08/17 0145)  Labs: Recent Labs    03/21/2019 0506 03/21/19 1039 03/22/19 2136 April 11, 2019 0201  WBC  --  6.9  --  16.9*  HGB  --  11.5* 10.2* 11.1*  PLT  --  114*  --  177  CREATININE 0.53* 0.65  --  0.52*    Estimated Creatinine Clearance: 60.7 mL/min (A) (by C-G formula based on SCr of 0.52 mg/dL (L)).  No results for input(s): VANCOTROUGH, VANCOPEAK, VANCORANDOM, GENTTROUGH, GENTPEAK, GENTRANDOM, TOBRATROUGH, TOBRAPEAK, TOBRARND, AMIKACINPEAK, AMIKACINTROU, AMIKACIN in the last 72 hours.   Microbiology: Recent Results (from the past 720 hour(s))  SARS Coronavirus 2 (Performed in [facility] hospital lab)     Status: None   Collection Time: 03/02/19  9:56 AM   Specimen: Nasal Swab  Result Value Ref Range Status   SARS Coronavirus 2 NEGATIVE NEGATIVE Final    Comment: (NOTE) SARS-CoV-2 target nucleic acids are NOT DETECTED. The SARS-CoV-2 RNA is generally detectable in upper and lower respiratory specimens during the acute phase of infection. Negative results do not preclude SARS-CoV-2 infection, do not rule out co-infections with other pathogens, and should not be used as the sole basis for treatment or other patient management decisions. Negative results must be combined with clinical observations, patient history, and epidemiological information. The expected result is Negative. Fact Sheet for  Patients: SugarRoll.be Fact Sheet for Healthcare Providers: https://www.woods-mathews.com/ This test is not yet approved or cleared by the [place] FDA and  has been authorized for detection and/or diagnosis of SARS-CoV-2 by FDA under an Emergency Use Authorization (EUA). This EUA will remain  in effect (meaning this test can be used) for the duration of the COVID-19 declaration under Section 56 4(b)(1) of the Act, 21 U.S.C. section 360bbb-3(b)(1), unless the authorization is terminated or revoked sooner. Performed at [facility], [place] [address] [place], [place] [address]   Urine culture     Status: None   Collection Time: 03/22/2019  9:57 AM   Specimen: Urine, Clean Catch  Result Value Ref Range Status   Specimen Description   Final    URINE, CLEAN CATCH Performed at [facility], [address] [place], [place] [address]    Special Requests   Final    NONE Performed at [facility], [address] [place], [place] [address]    Culture   Final    Multiple bacterial morphotypes present, none predominant. Suggest appropriate recollection if clinically indicated.   Report Status 03/11/2019 FINAL  Final  SARS CORONAVIRUS 2 Nasal Swab Aptima Multi Swab     Status: None   Collection Time: 03/09/2019 11:12 AM   Specimen: Aptima Multi Swab; Nasal Swab  Result Value Ref Range Status   SARS Coronavirus 2 NEGATIVE NEGATIVE Final    Comment: (NOTE) SARS-CoV-2 target nucleic acids are NOT DETECTED. The SARS-CoV-2 RNA is generally detectable in upper and lower respiratory specimens during the acute

## 2019-04-07 NOTE — Progress Notes (Signed)
Patient made comfort care today with family present in the room. Patient kept comfortable and any and all care was explained to the patient's wife and family and provided with their understanding and approval. Patient noted to have no breath sounds or heart beat at 1740, second verified [employer]. Dr Roderic Palau made aware. Family at bedside, emotional support provided.

## 2019-04-07 NOTE — Progress Notes (Signed)
Events over the past 24 hours noted.  Will follow peripherally with you.  Please call us if we can be of further assistance.

## 2019-04-07 NOTE — Progress Notes (Addendum)
Patient is a transfer from ICU. Patient was stable upon arrival, when a new set of vitals were taken, O2 sat was in the low 80s on 3L [place] and patient was tachycardic (130s). Respiratory called. Patient placed on 6L [place]. Patient experiencing some nausea so held off on BiPAP. Patient's O2 sats remained in the 80s on 6L. MD paged to come assess patient. MD ordered ABGs, CXR, Troponin, and EKG. Patient's O2 sat increased to mid 90s when placed on BiPAP. Will continue to monitor patient.

## 2019-04-07 NOTE — Progress Notes (Signed)
H&H ordered and collected due to bloody urine. H&H stable. PT to be transferred to 300.

## 2019-04-07 NOTE — Progress Notes (Signed)
Pharmacy Antibiotic Note   Today is day #7 of Zosyn therapy for this   78 y.o. male admitted on 03/19/2019 with pneumonia and was awaiting discharge before restarting antibiotics for aspiration.  Renal function remains fairly stable.   Plan:  Zosyn 3.375g IV q8h (4-hr inf )  Monitor labs, c/s, and patient improvement.    Height: 5\' 8"  (172.7 cm) Weight: 126 lb 1.7 oz (57.2 kg)(Simultaneous filing. User may not have seen previous data.) IBW/kg (Calculated) : 68.4  Temp (24hrs), Avg:99.1 F (37.3 C), Min:98.1 F (36.7 C), Max:100.1 F (37.8 C)  Recent Labs  Lab 03/17/19 0846 03/18/19 0418 03/19/19 0406 03/18/2019 0506 03/21/19 1039 04/02/2019 0201  WBC 3.3*  --  4.7  --  6.9 16.9*  CREATININE  --  0.46* 0.50* 0.53* 0.65 0.52*    Estimated Creatinine Clearance: 60.6 mL/min (A) (by C-G formula based on SCr of 0.52 mg/dL (L)).    Allergies  Allergen Reactions  . Actos [Pioglitazone] Other (See Comments)    Cramps in legs   . Metformin And Related Diarrhea  . Morphine Hives  . Statins     REACTION: leg cramps but tolerates pravastatin    Antimicrobials this admission: Zosyn 8/17 >> Augmentin 8/15 >>8/17 Zosyn 8/8>> 8/15 Vancomycin 8/8 >> 8/10  Microbiology results: 8/9 Bcx: ng 8/9 RespCx: gram _+ cocci Rare budding yeast- contaminant 8/4 UCx: ng 8/8 MRSA PCR: negative -  8/4 Covid 19 negative   Thank you for allowing pharmacy to be a part of this patient's care.  Ramond Craver 2019/04/02 8:28 AM

## 2019-04-07 NOTE — Progress Notes (Signed)
Patient began having a hard time breathing while on the BiPAP. RT removed patient from BiPAP and suctioned patient. Oxygen saturation dropped to the low 80s. Unable to get patient's oxygen saturation back up without the BiPAP.  MD was paged and an order was put in to transfer patient to stepdown. Patient was placed on a NRB mask and transferred to stepdown unit. Report was given to nurse, Velna Hatchet.

## 2019-04-07 NOTE — Progress Notes (Signed)
PROGRESS NOTE    [name]  DGU:[id number] DOB: [birth year] DOA: 03/17/2019 PCP: [name], MD    Brief Narrative:  78 year old male with a history of aortic stenosis status post repair, diabetes, hepatocellular carcinoma, admitted to the hospital with abdominal pain, nausea and vomiting.  He was generally weak, failure to thrive and was orthostatic.  Patient was hydrated with IV fluids with improvement of volume status.  He was noted to have significant swallowing difficulties and had PEG tube placed for dysphagia.  Hospital course has been complicated by repeated episodes of aspiration and worsening respiratory status.  Palliative care has been following him for goals of care.  Overall prognosis appears to be poor.   Assessment & Plan:   Principal Problem:   Pneumonia- HCAP Vs Aspiration Related with Hypoxia and Hypotension Active Problems:   Essential hypertension   GERD   Constipation   Cardiomyopathy, dilated, nonischemic (HCC)   Cancer, hepatocellular (HCC)   Failure to thrive in adult   Nausea in adult patient   Protein-calorie malnutrition, severe   Dehydration   Orthostatic dizziness   Acute Hypoxic respiratory failure  due to PNA   PSVT (paroxysmal supraventricular tachycardia) (HCC)   Hypotension   Goals of care, counseling/discussion   Palliative care by specialist   Oropharyngeal dysphagia   1. Acute respiratory failure with hypoxia.  Felt to be related to chronic aspiration due to dysphagia.  Patient had initially improved, but has decompensated again, likely related to repeated aspiration from tube feeding.  He is mildly febrile.  Currently on nonrebreather.  He is extremely lethargic and not an appropriate candidate for BiPAP.  His wife confirms that he would not want to be intubated. 2. Aspiration pneumonia.  We started on intravenous antibiotics.  Continue pulmonary hygiene. 3. Sepsis.  Patient has significant leukocytosis today, tachycardia and  hypotension.  He has evidence of possible pleural effusions on chest x-ray.  We will start to give patient fluid boluses and monitor volume status.  His albumin is very low making aggressive hydration difficult since he will likely third space IV fluids.  Will need to closely follow respiratory status with volume resuscitation.  Continue IV antibiotics. 4. Hepatocellular carcinoma.  Followed by Dr. Delton Coombes.  No plans for chemoradiation in the near future. 5. Diabetes.  Since patient is currently n.p.o. and tube feeds are on hold due to risk of aspiration, will hold off on Lantus and follow serial blood sugars. 6. Severe protein calorie malnutrition.  Malignancy related cachexia.  He has been receiving tube feeding.  He is also on Marinol 7. Goals of care.  Patient is being followed by palliative medicine.  I discussed his current state with his wife and expected prognosis.  Unfortunately, he appears to be declining.  His wife is wanting to continue with current treatments for now, but agrees with DNR status.  If he fails to improve with fluids and antibiotics, would likely focus on comfort care.   DVT prophylaxis: Heparin Code Status: DNR, confirmed with patient's wife Family Communication: Discussed with patient's wife over the phone Disposition Plan: Pending hospital course, prognosis is poor   Consultants:   General surgery  Pulmonology  Palliative medicine  Procedures:   PEG tube placement on 8/14  Antimicrobials:   Zosyn 8/9 > 8/15, 8/17 >   Subjective: Patient was transferred back to the ICU overnight for worsening hypoxia, tachycardia and hypotension.  He was seen by respiratory therapy and suctioned.  It was noted that he had  a lot of secretions and possibly tube feeds.  He is somnolent at this time.  Does not respond to voice.  Objective: Vitals:   04-11-2019 0615 2019-04-11 0630 04-11-19 0700 04/11/19 0738  BP: 93/62 (!) 72/56 (!) 89/56   Pulse: (!) 136 (!) 129 (!) 132  (!) 124  Resp: (!) 24 (!) 39 (!) 34 (!) 28  Temp:    100.1 F (37.8 C)  TempSrc:    Axillary  SpO2: 92% 91% 94% 90%  Weight:      Height:        Intake/Output Summary (Last 24 hours) at 04-11-19 0829 Last data filed at 04/11/2019 0600 Gross per 24 hour  Intake 904.29 ml  Output 6 ml  Net 898.29 ml   Filed Weights   03/21/19 0500 03/22/19 0500 Apr 11, 2019 0600  Weight: 58.7 kg 57.3 kg 57.2 kg    Examination:  General exam: Appears chronically ill, lethargic Respiratory system: Crackles at bases bilaterally. Respiratory effort normal. Cardiovascular system: S1 & S2 heard, regular, tachycardic. No JVD, murmurs, rubs, gallops or clicks. No pedal edema. Gastrointestinal system: Abdomen is nondistended, soft and nontender. No organomegaly or masses felt. Normal bowel sounds heard.  PEG tube in place Central nervous system: Somnolent, does not respond to voice, does not withdraw to pain Skin: No rashes, lesions or ulcers Psychiatry: lethargic, does not respond to voice    Data Reviewed: I have personally reviewed following labs and imaging studies  CBC: Recent Labs  Lab 03/17/19 0846 03/19/19 0406 03/21/19 1039 03/22/19 2136 2019/04/11 0201  WBC 3.3* 4.7 6.9  --  16.9*  HGB 8.1* 10.1* 11.5* 10.2* 11.1*  HCT 25.7* 31.0* 37.2* 32.5* 34.8*  MCV 92.8 89.9 92.8  --  91.8  PLT 55* 70* 114*  --  175   Basic Metabolic Panel: Recent Labs  Lab 03/18/19 0418 03/19/19 0406 04/05/2019 0506 03/21/19 1039 2019-04-11 0201  NA 135 136 135 134* 135  K 2.8* 3.4* 3.9 3.8 4.1  CL 101 101 100 96* 100  CO2 28 29 29 28 30   GLUCOSE 56* 54* 145* 180* 68*  BUN 9 11 14 12 21   CREATININE 0.46* 0.50* 0.53* 0.65 0.52*  CALCIUM 8.1* 8.2* 8.0* 8.2* 8.4*  MG  --  1.9  --   --   --    GFR: Estimated Creatinine Clearance: 60.6 mL/min (A) (by C-G formula based on SCr of 0.52 mg/dL (L)). Liver Function Tests: No results for input(s): AST, ALT, ALKPHOS, BILITOT, PROT, ALBUMIN in the last 168  hours. No results for input(s): LIPASE, AMYLASE in the last 168 hours. No results for input(s): AMMONIA in the last 168 hours. Coagulation Profile: Recent Labs  Lab 03/18/19 1618  INR 1.6*   Cardiac Enzymes: No results for input(s): CKTOTAL, CKMB, CKMBINDEX, TROPONINI in the last 168 hours. BNP (last 3 results) No results for input(s): PROBNP in the last 8760 hours. HbA1C: No results for input(s): HGBA1C in the last 72 hours. CBG: Recent Labs  Lab 03/22/19 0715 03/22/19 1132 03/22/19 1600 03/22/19 2208 2019-04-11 0736  GLUCAP 57* 70 94 104* 72   Lipid Profile: No results for input(s): CHOL, HDL, LDLCALC, TRIG, CHOLHDL, LDLDIRECT in the last 72 hours. Thyroid Function Tests: No results for input(s): TSH, T4TOTAL, FREET4, T3FREE, THYROIDAB in the last 72 hours. Anemia Panel: No results for input(s): VITAMINB12, FOLATE, FERRITIN, TIBC, IRON, RETICCTPCT in the last 72 hours. Sepsis Labs: No results for input(s): PROCALCITON, LATICACIDVEN in the last 168 hours.  Recent Results (  from the past 240 hour(s))  MRSA PCR Screening     Status: None   Collection Time: 03/14/19  6:34 PM   Specimen: Nasal Mucosa; Nasopharyngeal  Result Value Ref Range Status   MRSA by PCR NEGATIVE NEGATIVE Final    Comment:        The GeneXpert MRSA Assay (FDA approved for NASAL specimens only), is one component of a comprehensive MRSA colonization surveillance program. It is not intended to diagnose MRSA infection nor to guide or monitor treatment for MRSA infections. Performed at [facility], [address], [place], [place] [address]   Culture, blood (Routine X 2) w Reflex to ID Panel     Status: None   Collection Time: 03/15/19 10:57 AM   Specimen: BLOOD RIGHT HAND  Result Value Ref Range Status   Specimen Description   Final    BLOOD RIGHT HAND BOTTLES DRAWN AEROBIC AND ANAEROBIC   Special Requests Blood Culture adequate volume  Final   Culture   Final    NO GROWTH 5 DAYS Performed at  [facility], [address], [place], [place] [address]    Report Status 03/22/2019 FINAL  Final  Culture, blood (Routine X 2) w Reflex to ID Panel     Status: None   Collection Time: 03/15/19 11:08 AM   Specimen: BLOOD RIGHT WRIST  Result Value Ref Range Status   Specimen Description   Final    BLOOD RIGHT WRIST BOTTLES DRAWN AEROBIC AND ANAEROBIC   Special Requests Blood Culture adequate volume  Final   Culture   Final    NO GROWTH 5 DAYS Performed at [facility], [address] [place], [place] [address]    Report Status 03/19/2019 FINAL  Final  Expectorated sputum assessment w rflx to resp cult     Status: None   Collection Time: 03/15/19  5:55 PM   Specimen: Expectorated Sputum  Result Value Ref Range Status   Specimen Description EXPECTORATED SPUTUM  Final   Special Requests Normal  Final   Sputum evaluation   Final    THIS SPECIMEN IS ACCEPTABLE FOR SPUTUM CULTURE Performed at [facility], [address] [place], [place] [address]    Report Status 03/15/2019 FINAL  Final  Culture, respiratory     Status: None   Collection Time: 03/15/19  5:55 PM  Result Value Ref Range Status   Specimen Description   Final    EXPECTORATED SPUTUM Performed at [facility], [address], [place], [place] [address]    Special Requests   Final    Normal Reflexed from [id number] Performed at [facility], [address], [place],  [address]    Gram Stain   Final    FEW WBC PRESENT,BOTH PMN AND MONONUCLEAR GRAM POSITIVE COCCI IN PAIRS RARE BUDDING YEAST SEEN    Culture   Final    FEW Consistent with normal respiratory flora. Performed at [facility], [place] [address], [place],  [address]    Report Status 03/17/2019 FINAL  Final  Surgical pcr screen     Status: None   Collection Time: 03/10/2019  3:20 AM   Specimen: Nasal Mucosa; Nasal Swab  Result Value Ref Range Status   MRSA, PCR NEGATIVE NEGATIVE Final   Staphylococcus aureus NEGATIVE NEGATIVE Final    Comment:  (NOTE) The Xpert SA Assay (FDA approved for NASAL specimens in patients 10 years of age and older), is one component of a comprehensive surveillance program. It is not intended  to diagnose infection nor to guide or monitor treatment. Performed at [facility], [address], [place], [place] [address]          Radiology Studies: Dg Chest 1 View  Result Date: 03/21/2019 CLINICAL DATA:  Dyspnea. Peg tube placement yesterday. EXAM: CHEST  1 VIEW COMPARISON:  Chest x-rays dated 03/16/2019 and 03/15/2019. FINDINGS: Heart size and mediastinal contours are stable. RIGHT-sided PICC line appears adequately positioned with tip at the level of the lower SVC. Persistent confluent opacity at the RIGHT lung base. Improved aeration at the LEFT lung base. No new lung findings. IMPRESSION: 1. Persistent confluent opacity at the RIGHT lung base, pneumonia versus asymmetric pulmonary edema. 2. Improved aeration at the LEFT lung base. Electronically Signed   By: Franki Cabot M.D.   On: 03/21/2019 11:28   Dg Abd 1 View  Result Date: 03/21/2019 CLINICAL DATA:  78 year old male with dyspnea and history of recent PEG tube placement EXAM: ABDOMEN - 1 VIEW COMPARISON:  Prior chest x-ray 11/30/2018 FINDINGS: Extensive airspace opacities present in the right lung base. A right upper extremity PICC is present. The catheter tip overlies the cavoatrial junction. Cardiac and mediastinal contours are within normal limits. Atherosclerotic calcifications are present in the transverse aorta. Patient is status post median sternotomy with evidence of prior aortic valve replacement. The left lung is relatively clear. No acute osseous abnormality. IMPRESSION: 1. Extensive right basilar infiltrate may represent pneumonia, or aspiration. 2. Right upper extremity PICC with the catheter tip in good position at the cavoatrial junction. 3. Gastrostomy tube projects over the gastric bubble. Electronically Signed   By: Jacqulynn Cadet M.D.    On: 03/21/2019 14:15   Dg Chest Port 1 View  Result Date: 2019-04-17 CLINICAL DATA:  Hypoxia. Productive cough. EXAM: PORTABLE CHEST 1 VIEW COMPARISON:  Radiograph 2 days ago, additional priors. FINDINGS: Tip of the right upper extremity PICC in the SVC. Slight worsening confluent opacity at the right lung base. Slight worsening left basilar aeration with streaking confluent opacities. Possible pleural effusions. Unchanged heart size and mediastinal contours with prosthetic aortic valve. Mild vascular congestion. No pneumothorax. IMPRESSION: Worsening bibasilar opacities, right greater than left. Possible small pleural effusions. Electronically Signed   By: Keith Rake M.D.   On: Apr 17, 2019 02:34        Scheduled Meds:  albuterol  2.5 mg Nebulization TID   albuterol  2.5 mg Nebulization Once   aspirin  81 mg Per Tube Daily   Chlorhexidine Gluconate Cloth  6 each Topical Q0600   cholecalciferol  2,000 Units Per Tube Daily   digoxin  0.1875 mg Per Tube Daily   dronabinol  5 mg Oral BID AC   free water  200 mL Per Tube Q6H   guaiFENesin  15 mL Per Tube QID   heparin  5,000 Units Subcutaneous Q8H   insulin aspart  0-5 Units Subcutaneous QHS   insulin aspart  0-6 Units Subcutaneous TID WC   lactulose  20 g Per Tube Daily   mouth rinse  15 mL Mouth Rinse BID   omega-3 acid ethyl esters  1 g Per Tube QPM   pantoprazole sodium  20 mg Per Tube Daily   potassium chloride  10 mEq Per Tube BID   Continuous Infusions:  albumin human     dextrose 5 % and 0.45 % NaCl with KCl 20 mEq/L 10 mL/hr at 2019/04/17 0426   feeding supplement (OSMOLITE 1.5 CAL) Stopped (2019-04-17 0559)   piperacillin-tazobactam (ZOSYN)  IV  sodium chloride       LOS: 12 days    Critical care time: 77mins    Kathie Dike, MD [facility]   If 7PM-7AM, please contact night-coverage [email or web address]  March 28, 2019, 8:29 AM

## 2019-04-07 NOTE — Progress Notes (Signed)
PICC line taken out- condom catheter removed and patient placement called to make aware patient is ready to go to morgue.

## 2019-04-07 NOTE — Progress Notes (Signed)
Xcover  Hypoxia per RN, pt requiring more o2 Pox 80's on o2 [place] No cp, no palp, no n/v  Exam:   T 99.9  P 130, R 41, Bp 136/75 pox 90% on Bipap  Heent: anicteric Neck: no jvd Heart: tachy s1, s2,  Lung: + crackles right lung base, no wheeze, slight decrease in bs at left lung base Abd: covered Ext: no c/c/e  A/p Acute Respiratory Failure with Hypoxia ABG stat CXR stat Start Bipap   Tachycardia 12 lead ekg Trop I q3h x2 Cardiac echo on 03/16/2019  Critical care time 30 minutes

## 2019-04-07 NOTE — Progress Notes (Addendum)
Daily Progress Note   Patient Name: [name]       Date: 03/27/19 DOB: 10/06/1940  Age: 78 y.o. MRN#: [id number] Attending Physician: Kathie Dike, MD Primary Care Physician: [name], MD Admit Date: 03/24/2019  Reason for Consultation/Follow-up: Establishing goals of care and Terminal Care  Subjective: Patient lethargic. Appears uncomfortable with facial grimacing.  GOC:  Dr. Roderic Palau met with family. PMT provider f/u with family after Dr. Roderic Palau regarding plan for full shift to comfort. Reviewed course of hospitalization. Family does not want to see him suffer. Discussed comfort focused care plan in order to allow peace and dignity at EOL. Educated on EOL expectations and medications to ensure comfort and relief from suffering.  Family would appreciate visit from chaplain. Answered questions and concerns and provided PMT contact information.    ADDENDUM: 1500 f/u with patient and family at bedside. Patient appears comfortable with shallow, regular respiratory pattern. Recently given prn dilaudid. Educated on EOL expectations and medications for comfort. Family at bedside confirms that focus is to make him "comfortable" and they believe he appears peaceful this afternoon. Chaplain has visited bedside. Emotional/spiritual support provided.    Length of Stay: 12  Current Medications: Scheduled Meds:   albuterol  2.5 mg Nebulization Once   mouth rinse  15 mL Mouth Rinse BID    Continuous Infusions:   PRN Meds: acetaminophen, alum & mag hydroxide-simeth, glycopyrrolate, guaiFENesin-dextromethorphan, HYDROmorphone (DILAUDID) injection, LORazepam, meclizine, ondansetron (ZOFRAN) IV, oxyCODONE, promethazine  Physical Exam Vitals signs and nursing note reviewed.    Constitutional:      Appearance: He is cachectic. He is ill-appearing.  HENT:     Head: Normocephalic and atraumatic.  Cardiovascular:     Rate and Rhythm: Tachycardia present.  Pulmonary:     Effort: No tachypnea, accessory muscle usage or respiratory distress.     Breath sounds: Decreased breath sounds present.  Abdominal:     Comments: PEG  Skin:    General: Skin is warm and dry.     Coloration: Skin is pale.  Neurological:     Mental Status: He is lethargic.     Comments: Facial grimacing. RN to give prn dilaudid.            Vital Signs: BP (!) 76/46    Pulse (!) 112    Temp  100.1 F (37.8 C) (Axillary)    Resp 20    Ht '5\' 8"'$  (1.727 m)    Wt 57.2 kg Comment: Simultaneous filing. User may not have seen previous data.   SpO2 96%    BMI 19.17 kg/m  SpO2: SpO2: 96 % O2 Device: O2 Device: High Flow Nasal Cannula, NRB O2 Flow Rate: O2 Flow Rate (L/min): 15 L/min  Intake/output summary:   Intake/Output Summary (Last 24 hours) at Mar 31, 2019 1229 Last data filed at 03/31/2019 1048 Gross per 24 hour  Intake 2009.08 ml  Output 6 ml  Net 2003.08 ml   LBM: Last BM Date: 2019-03-31 Baseline Weight: Weight: 50.3 kg Most recent weight: Weight: 57.2 kg(Simultaneous filing. User may not have seen previous data.)       Palliative Assessment/Data: PPS 10%    Flowsheet Rows     Most Recent Value  Intake Tab  Referral Department  Hospitalist  Unit at Time of Referral  ICU  Palliative Care Primary Diagnosis  Cancer  Palliative Care Type  New Palliative care  Reason for referral  Clarify Goals of Care  Date first seen by Palliative Care  03/16/19  Clinical Assessment  Palliative Performance Scale Score  10%  Psychosocial & Spiritual Assessment  Palliative Care Outcomes  Patient/Family meeting held?  Yes  Who was at the meeting?  wife, children  Palliative Care Outcomes  Clarified goals of care, Provided end of life care assistance, Provided psychosocial or spiritual support,  Improved pain interventions, Improved non-pain symptom therapy, Changed to focus on comfort      Patient Active Problem List   Diagnosis Date Noted   Oropharyngeal dysphagia    Palliative care by specialist    Pneumonia- HCAP Vs Aspiration Related with Hypoxia and Hypotension 03/15/2019   Acute Hypoxic respiratory failure  due to PNA 03/15/2019   PSVT (paroxysmal supraventricular tachycardia) ([place]) 03/15/2019   Hypotension 03/15/2019   Goals of care, counseling/discussion 03/15/2019   Orthostatic dizziness 03/14/2019   Protein-calorie malnutrition, severe 03/11/2019   Dehydration    Failure to thrive in adult 03/21/2019   Nausea in adult patient 03/31/2019   Closed fracture of left proximal humerus 11/24/18 02/05/2019   Elevated liver function tests    Cancer, hepatocellular ([place]) 11/24/2018   Anorexia    Calculus of gallbladder without cholecystitis without obstruction    Dilated pancreatic duct    Liver lesion    Pancreatitis 11/11/2018   AKI (acute kidney injury) ([place]) 11/11/2018   Diabetes mellitus type 2, uncomplicated ([place]) 28/78/6767   Osteoporosis 11/15/2015   Osteopenia determined by x-ray 04/12/2015   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   Purpura senilis ([place]) 12/29/2014   Carotid artery stenosis 09/02/2014   Cardiomyopathy, dilated, nonischemic (HCC) 04/18/2014   Postoperative atrial fibrillation ([place]) 04/18/2014   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   Aortic stenosis 20/94/7096   Chronic systolic heart failure ([place]) 04/08/2014   Unstable angina ([place]) 04/08/2014   Chest pain 04/08/2014   Personal history of colonic polyps 11/26/2013   Loss of weight 11/02/2013   Aortic valve disorder 07/11/2010   LBBB 07/11/2010   Bilateral carotid bruits 07/11/2010   NEOPLASM OF UNCERTAIN BEHAVIOR OF SKIN 09/27/2008   BENIGN PROSTATIC HYPERTROPHY, WITH OBSTRUCTION 09/27/2008   KNEE PAIN 09/27/2008   FLATULENCE ERUCTATION  AND GAS PAIN 04/06/2008   ALLERGIC RHINITIS 01/09/2008   Constipation 10/17/2007   HEADACHE 07/25/2007   LEG CRAMPS 02/13/2007   DYSPNEA ON EXERTION 01/10/2007   Hyperlipidemia 10/10/2006   Essential hypertension  10/10/2006   GERD 10/10/2006   CARDIAC MURMUR 10/10/2006   CHEST PAIN, EXERTIONAL 10/10/2006    Palliative Care Assessment & Plan   Patient Profile: 78 y.o. male  with past medical history of hepatocellular carcinoma s/p radiation, aortic stenosis s/p repair, DM, HTN, HLD CAD admitted on 04/03/2019 with abdominal pain, n/v, weakness. Patient admitted for failure to thrive. CT abdomen negative for acute findings.  On 8/8, patient developed persistent tachycardia, hypotension and hypoxia requiring transfer to ICU for vasopressor support and HFNC. Bilateral HCAP vs. Aspiration pneumonia. Antibiotics initiated. SLP evaluated and planning for MBS on 8/11. Palliative medicine consultation for goals of care.   Assessment: Hepatocellular carcinoma Bilateral pneumonia, suspect aspiration Tachyarrhythmia with hypotension Acute hypoxic respiratory failure Severe protein calorie malnutrition Adult failure to thrive Dysphagia s/p PEG placement Generalized weakness and deconditioning  Recommendations/Plan:  Clinical decline. Back in ICU. Family desire to shift to comfort measures only, understanding poor prognosis.  Comfort medications on MAR. May need continuous infusion if symptoms uncontrolled despite dilaudid boluses.   Cardiac monitor to comfort screen.  Spiritual care for EOL care.  DNR/DNI  Code Status: DNR   Code Status Orders  (From admission, onward)         Start     Ordered   03/16/19 1334  Limited resuscitation (code)  Continuous    Question Answer Comment  In the event of cardiac or respiratory ARREST: Initiate Code Blue, Call Rapid Response Yes   In the event of cardiac or respiratory ARREST: Perform CPR No   In the event of cardiac or  respiratory ARREST: Perform Intubation/Mechanical Ventilation Yes   In the event of cardiac or respiratory ARREST: Use NIPPV/BiPAp only if indicated Yes   In the event of cardiac or respiratory ARREST: Administer ACLS medications if indicated Yes   In the event of cardiac or respiratory ARREST: Perform Defibrillation or Cardioversion if indicated Yes   Comments NO COMPRESSIONS      03/16/19 1333        Code Status History    Date Active Date Inactive Code Status Order ID Comments User Context   04/02/2019 1652 03/16/2019 1333 DNR [id number]  Albertine Patricia, MD ED   11/11/2018 0841 11/14/2018 1610 Full Code [id number]  Tawni Millers, MD Inpatient   04/13/2014 1309 04/20/2014 1542 Full Code [id number]  Caryn Bee Inpatient   04/09/2014 1658 04/13/2014 1309 Full Code [id number]  Jettie Booze, MD Inpatient   04/08/2014 2136 04/09/2014 1658 Full Code [id number]  Skeet Latch, MD Inpatient   Advance Care Planning Activity    Advance Directive Documentation     Most Recent Value  Type of Advance Directive  Out of facility DNR (pink MOST or yellow form) [patient is DNR]  Pre-existing out of facility DNR order (yellow form or pink MOST form)  --  "MOST" Form in Place?  --       Prognosis:   Hours-days  Discharge Planning:  Anticipated Hospital Death  Care plan was discussed with Dr. Roderic Palau, RN, multiple family members including wife and three children  Thank you for allowing the Palliative Medicine Team to assist in the care of this patient.   Time In: 1010 Time Out: 1040 Total Time 30 Prolonged Time Billed no      Greater than 50%  of this time was spent counseling and coordinating care related to the above assessment and plan.  Ihor Dow, DNP, FNP-C Palliative Medicine Team  Phone: [phone] Fax: [phone]  Please  contact Palliative Medicine Team phone at [phone] for questions and concerns.

## 2019-04-07 NOTE — Progress Notes (Signed)
Contacted family. PT in SVT and BP low. Informed MD. PT given Dilaudid and tylenol PRN. Pt on Nonrebreather. Oral suctioning provided. PT o2 saturaion 90-91%. Continue to monitor. RR 52.

## 2019-04-07 DEATH — deceased

## 2019-04-15 NOTE — Progress Notes (Signed)
  Radiation Oncology         [phone] ________________________________  Name: [name] MRN: [id number]  Date: 02/12/2019  DOB: 03-Nov-1940  End of Treatment Note  Diagnosis:   Hepatocellular carcinoma     Indication for treatment::  palliative       Radiation treatment dates:   01/28/2019 through 02/11/2019  Site/dose:   The patient was treated to the liver lesion to a dose of 50 Gray in 10 fractions at [employer] per fraction.  This was carried out using a 2 field IMRT technique.  Narrative: The patient tolerated radiation treatment relatively well.   No unexpected difficulties during treatment.  The patient was able to receive his prescribed course of radiation treatment.  Plan: The patient has completed radiation treatment. The patient will return to radiation oncology clinic for routine followup in one month. I advised the patient to call or return sooner if they have any questions or concerns related to their recovery or treatment. ________________________________  Jodelle Gross, M.D., Ph.D.

## 2019-09-01 ENCOUNTER — Encounter: Payer: Self-pay | Admitting: Family Medicine

## 2019-09-16 ENCOUNTER — Telehealth: Payer: Self-pay | Admitting: Family Medicine

## 2019-09-16 NOTE — Telephone Encounter (Signed)
error 

## 2020-03-01 IMAGING — NM NUCLEAR MEDICINE WHOLE BODY BONE SCINTIGRAPHY
4 series · 4 of 4 positions shown · non-contrast
Comparison: None

CLINICAL DATA: Hepatocellular carcinoma, recently fell about 10
days ago, experiencing LEFT elbow, LEFT shoulder and LEFT rib pain,
abnormal radiographs LEFT humeral neck question fracture, abnormal
LEFT hip radiographs with lucency along the LEFT acetabular cup
question particle disease

EXAM:
NUCLEAR MEDICINE WHOLE BODY BONE SCAN
TECHNIQUE: Whole body anterior and posterior images were obtained approximately
3 hours after intravenous injection of radiopharmaceutical.
RADIOPHARMACEUTICALS:  20 mCi 1echnetium-IIm MDP IV

[Series 1: whole body · 2.66mm/px · 1 of 1 slices shown (1 of 2)]
[im 1/1]
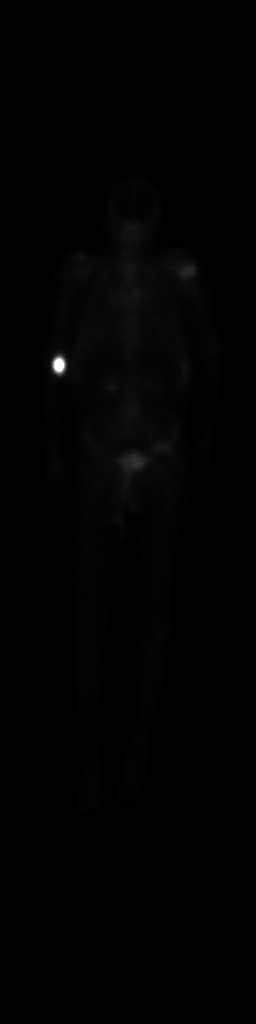

[Series 1: [id number]_[id number] whole body · 2.66mm/px · 1 of 1 slices shown (1 of 2)]
[im 1/1]
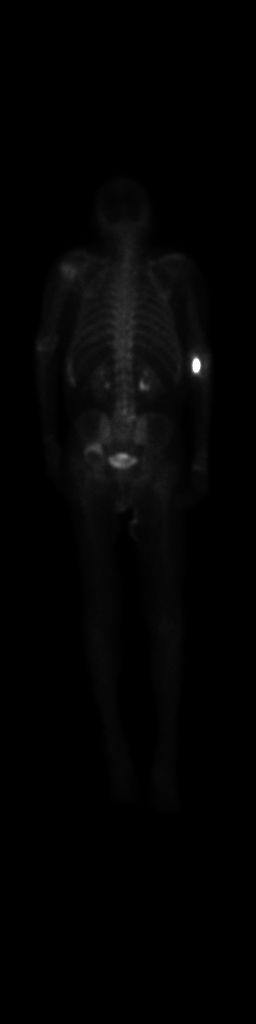

[Series 1: wbr_bone_60 whole body · 2.66mm/px · 1 of 1 slices shown (2 of 2)]
[im 1/1]
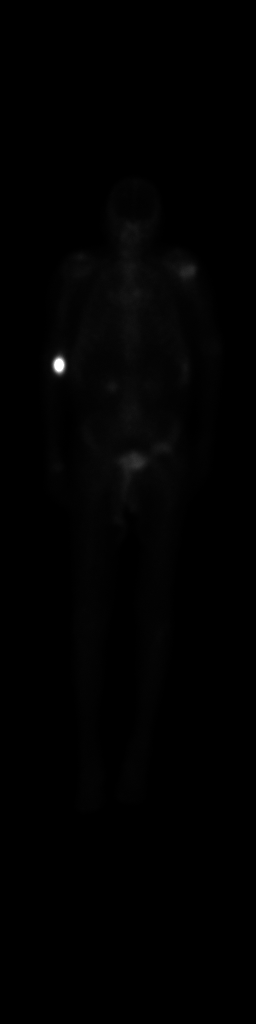

[Series 1: whole body · 2.66mm/px · 1 of 1 slices shown (2 of 2)]
[im 1/1]
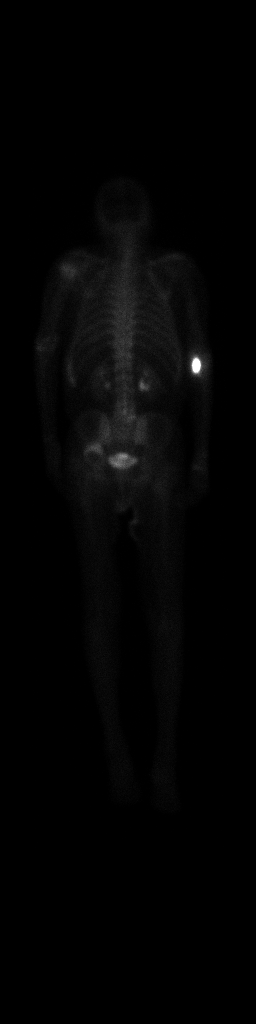

[4 of 4 positions shown; findings below may reference images not displayed]

Correlation: LEFT shoulder radiographs 12/01/2018, CT chest
11/11/2018, CT abdomen and pelvis 11/30/2018, LEFT hip radiographs
11/23/2018, LEFT elbow radiographs 11/23/2018
FINDINGS: Uptake transversely at the proximal LEFT humerus at the level of the
surgical neck consistent with fracture.

Increased tracer localization at the LEFT acetabulum adjacent to the
acetabular cup, which may represent particle disease or loosening.

Uptake at lower anterior LEFT ribs question fracture; recent CT of
11/30/2018 demonstrates a nondisplaced fracture of the lateral LEFT
ninth rib.

Uptake at LEFT elbow, RIGHT wrist, and LEFT knee, typically
degenerative.

Uptake at lower lumbar spine corresponding to degenerative changes
on CT.

No additional worrisome sites of abnormal osseous tracer
accumulation are identified.

Expected urinary tract and soft tissue distribution of tracer.
IMPRESSION: Uptake at a lower lateral LEFT rib question fracture, corresponding
to nondisplaced lateral LEFT ninth rib fracture on recent CT.

Transverse fracture at proximal LEFT humerus consistent with
fracture.

Uptake adjacent to the acetabular cup of LEFT hip prosthesis
consistent with concern of particle disease or cup loosening.

Additional scattered degenerative type uptake as above.

## 2020-06-17 IMAGING — CR ABDOMEN - 1 VIEW
1 series · 1 of 1 positions shown · non-contrast
Comparison: Prior chest x-ray 11/30/2018

CLINICAL DATA: 77-year-old male with dyspnea and history of recent
PEG tube placement

EXAM:
ABDOMEN - 1 VIEW

[no exam]
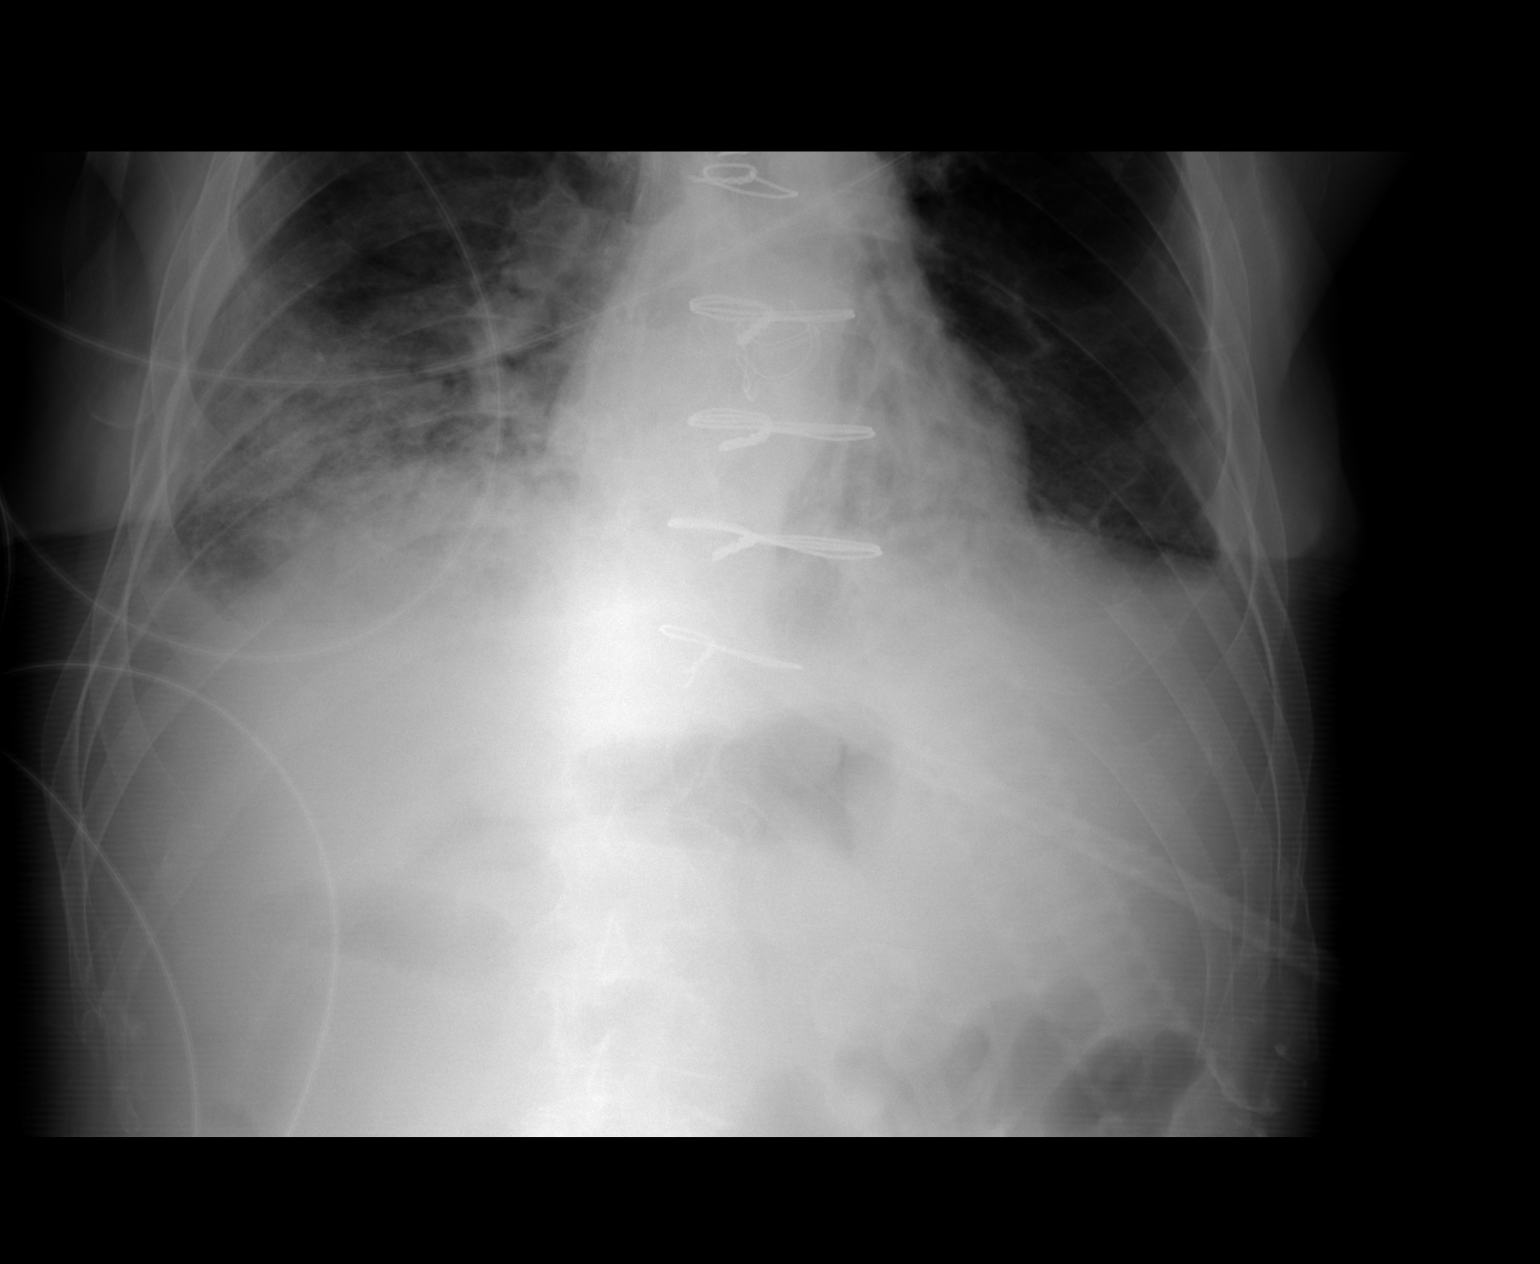

[1 of 1 positions shown; findings below may reference images not displayed]

FINDINGS: Extensive airspace opacities present in the right lung base. A right
upper extremity PICC is present. The catheter tip overlies the
cavoatrial junction. Cardiac and mediastinal contours are within
normal limits. Atherosclerotic calcifications are present in the
transverse aorta. Patient is status post median sternotomy with
evidence of prior aortic valve replacement. The left lung is
relatively clear. No acute osseous abnormality.
IMPRESSION: 1. Extensive right basilar infiltrate may represent pneumonia, or
aspiration.
2. Right upper extremity PICC with the catheter tip in good position
at the cavoatrial junction.
3. Gastrostomy tube projects over the gastric bubble.

## 2020-06-17 IMAGING — CR CHEST  1 VIEW
1 series · 2 of 2 positions shown · non-contrast
Comparison: Chest x-rays dated 03/16/2019 and 03/15/2019.

CLINICAL DATA: Dyspnea. Peg tube placement yesterday.

EXAM:
CHEST  1 VIEW

[Series 1: no exam · 0.17mm/px · 2 of 2 slices shown]
[im 1/2]
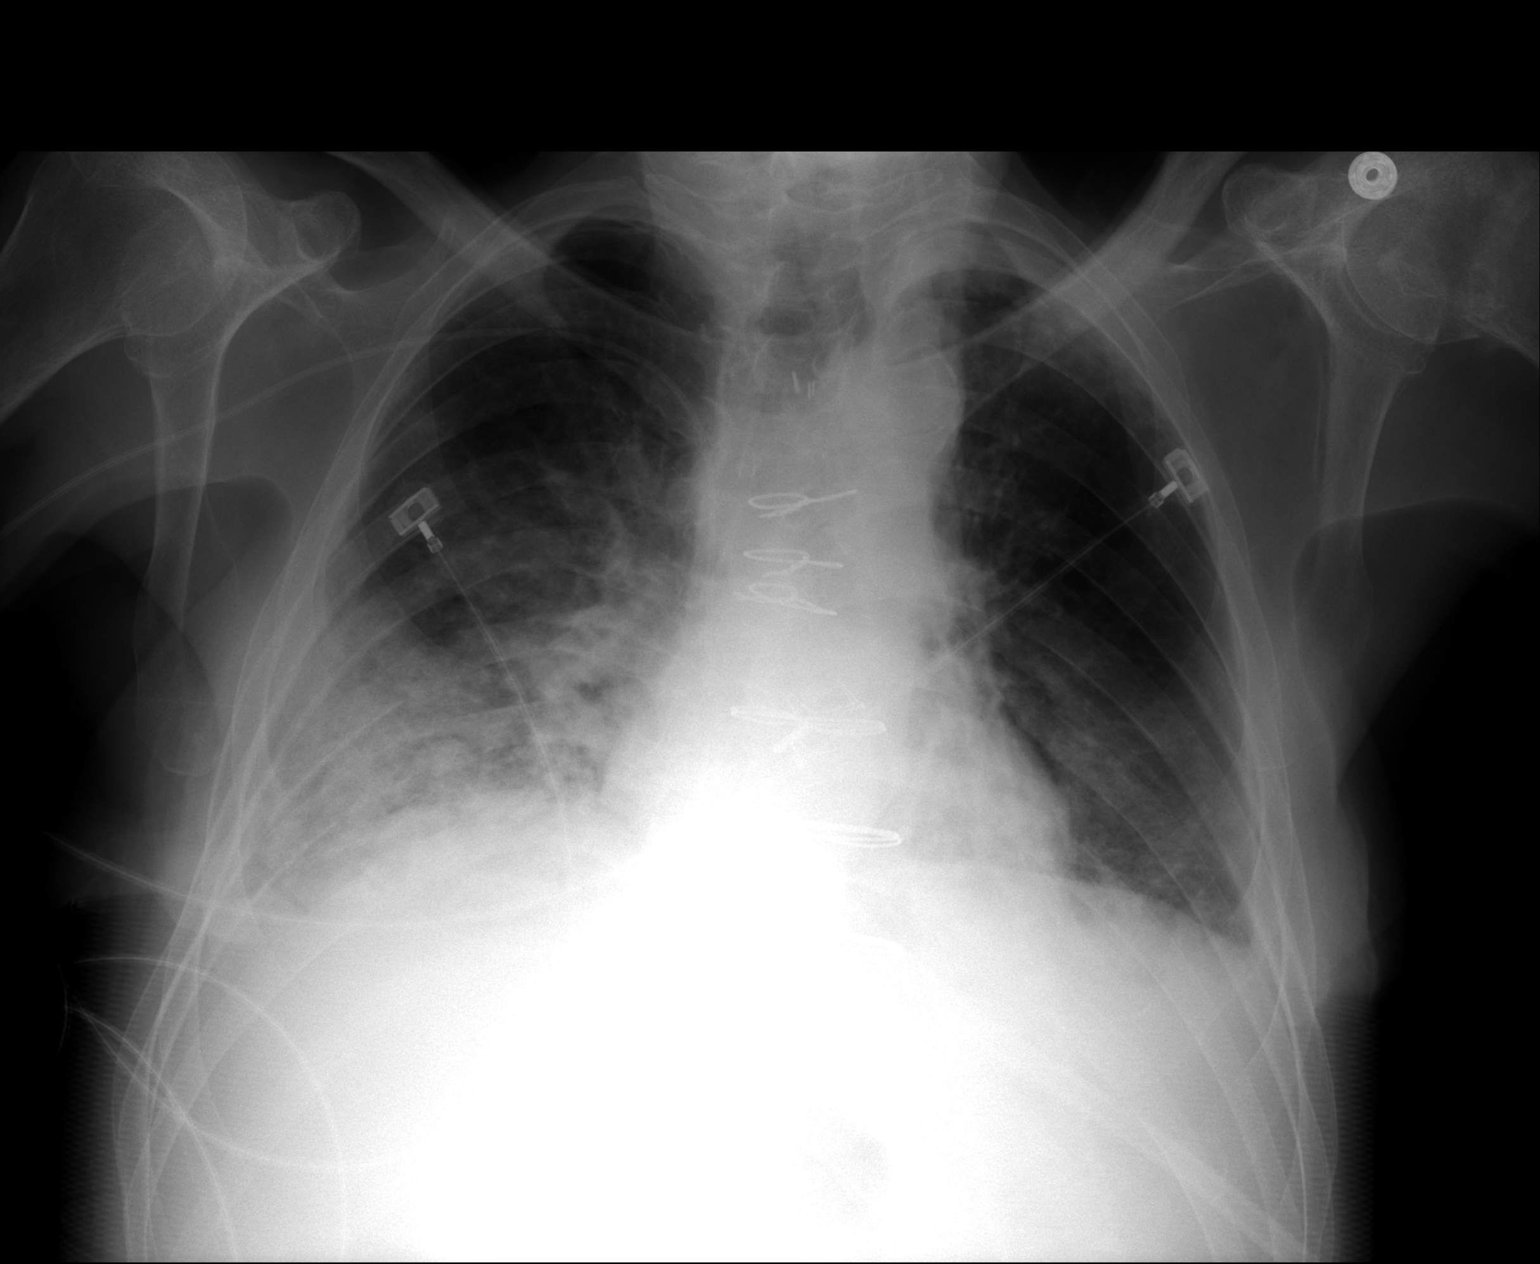
[im 2/2]
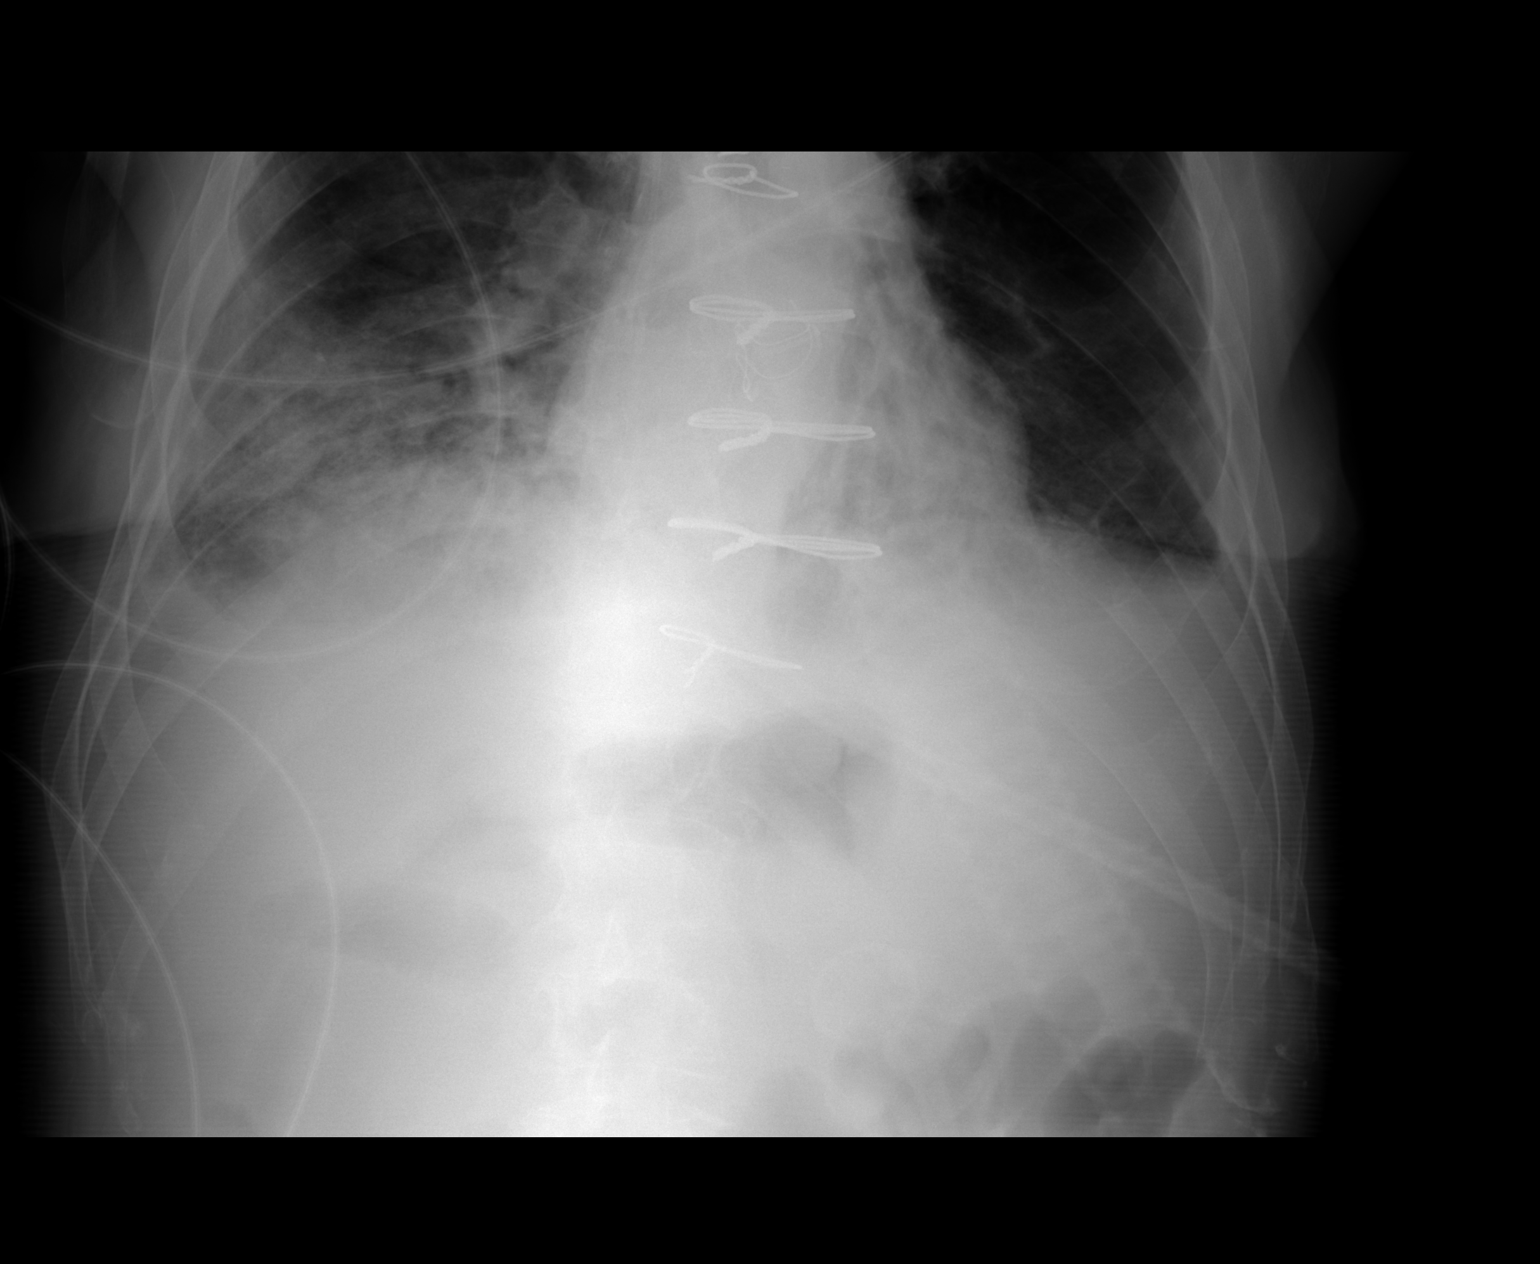

[2 of 2 positions shown; findings below may reference images not displayed]

FINDINGS: Heart size and mediastinal contours are stable. RIGHT-sided PICC
line appears adequately positioned with tip at the level of the
lower SVC. Persistent confluent opacity at the RIGHT lung base.
Improved aeration at the LEFT lung base. No new lung findings.
IMPRESSION: 1. Persistent confluent opacity at the RIGHT lung base, pneumonia
versus asymmetric pulmonary edema.
2. Improved aeration at the LEFT lung base.
